# Patient Record
Sex: Male | Born: 1965
Health system: Southern US, Community
[De-identification: ages and names within clinical notes are randomized; demographics above are authoritative.]

## PROBLEM LIST (undated history)

## (undated) DIAGNOSIS — F32A Depression, unspecified: Secondary | ICD-10-CM

## (undated) DIAGNOSIS — W19XXXA Unspecified fall, initial encounter: Secondary | ICD-10-CM

## (undated) DIAGNOSIS — F419 Anxiety disorder, unspecified: Secondary | ICD-10-CM

## (undated) DIAGNOSIS — L409 Psoriasis, unspecified: Secondary | ICD-10-CM

## (undated) DIAGNOSIS — M1990 Unspecified osteoarthritis, unspecified site: Secondary | ICD-10-CM

## (undated) DIAGNOSIS — F1010 Alcohol abuse, uncomplicated: Secondary | ICD-10-CM

## (undated) DIAGNOSIS — G8929 Other chronic pain: Secondary | ICD-10-CM

## (undated) DIAGNOSIS — S42401A Unspecified fracture of lower end of right humerus, initial encounter for closed fracture: Secondary | ICD-10-CM

## (undated) DIAGNOSIS — M549 Dorsalgia, unspecified: Secondary | ICD-10-CM

## (undated) DIAGNOSIS — F329 Major depressive disorder, single episode, unspecified: Secondary | ICD-10-CM

## (undated) HISTORY — PX: SHOULDER SURGERY: SHX246

## (undated) HISTORY — DX: Psoriasis, unspecified: L40.9

## (undated) HISTORY — PX: TOTAL HIP ARTHROPLASTY: SHX124

## (undated) HISTORY — DX: Unspecified osteoarthritis, unspecified site: M19.90

## (undated) HISTORY — PX: APPENDECTOMY: SHX54

## (undated) HISTORY — DX: Anxiety disorder, unspecified: F41.9

## (undated) HISTORY — PX: FOOT SURGERY: SHX648

---
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
## 1998-01-04 ENCOUNTER — Emergency Department (HOSPITAL_COMMUNITY): Admission: EM | Admit: 1998-01-04 | Discharge: 1998-01-04 | Payer: Self-pay | Admitting: Emergency Medicine

## 1999-08-02 ENCOUNTER — Emergency Department (HOSPITAL_COMMUNITY): Admission: EM | Admit: 1999-08-02 | Discharge: 1999-08-02 | Payer: Self-pay

## 1999-09-14 ENCOUNTER — Ambulatory Visit (HOSPITAL_COMMUNITY): Admission: RE | Admit: 1999-09-14 | Discharge: 1999-09-14 | Payer: Self-pay | Admitting: Orthopedic Surgery

## 1999-09-14 ENCOUNTER — Encounter: Payer: Self-pay | Admitting: Orthopedic Surgery

## 2005-02-15 ENCOUNTER — Ambulatory Visit (HOSPITAL_COMMUNITY): Admission: RE | Admit: 2005-02-15 | Discharge: 2005-02-15 | Payer: Self-pay | Admitting: Family Medicine

## 2006-07-12 ENCOUNTER — Ambulatory Visit (HOSPITAL_COMMUNITY): Admission: RE | Admit: 2006-07-12 | Discharge: 2006-07-12 | Payer: Self-pay | Admitting: General Surgery

## 2007-05-22 ENCOUNTER — Ambulatory Visit (HOSPITAL_COMMUNITY): Admission: RE | Admit: 2007-05-22 | Discharge: 2007-05-22 | Payer: Self-pay | Admitting: Family Medicine

## 2007-10-12 ENCOUNTER — Inpatient Hospital Stay (HOSPITAL_COMMUNITY): Admission: RE | Admit: 2007-10-12 | Discharge: 2007-10-14 | Payer: Self-pay | Admitting: Orthopedic Surgery

## 2009-08-21 ENCOUNTER — Encounter: Admission: RE | Admit: 2009-08-21 | Discharge: 2009-08-21 | Payer: Self-pay | Admitting: Neurosurgery

## 2010-04-16 ENCOUNTER — Encounter: Admission: RE | Admit: 2010-04-16 | Discharge: 2010-04-16 | Payer: Self-pay | Admitting: Neurosurgery

## 2010-09-29 NOTE — H&P (Signed)
NAME:  Howard [name], Howard [name] NO.:  [id number]   MEDICAL RECORD NO.:  [id number]          PATIENT TYPE:  INP   LOCATION:  NA                           FACILITY:  [facility]   PHYSICIAN:  Madlyn Frankel. Charlann Boxer, M.D.  DATE OF BIRTH:  Sep 08, 1965   DATE OF ADMISSION:  10/12/2007  DATE OF DISCHARGE:                              HISTORY & PHYSICAL   PROCEDURE:  Right total hip arthroplasty.   CHIEF COMPLAINT:  Right hip pain.   HISTORY OF PRESENT ILLNESS:  A 45 year old male with a history of right  hip pain secondary to avascular necrosis.  It has been refractory to all  conservative treatment.  It has been persistent and progressive in  nature, significantly diminished quality of life.  Pain has been  intractable and he has been scheduled for a right total hip replacement.   PAST MEDICAL HISTORY:  1. Avascular necrosis.  2. Degenerative disk disease.   PAST SURGICAL HISTORY:  Appendectomy.   FAMILY HISTORY:  Diabetes, arthritis.   SOCIAL HISTORY:  Married.  Primary caregiver after procedure will be  spouse.   DRUG ALLERGIES:  NO KNOWN DRUG ALLERGIES.   MEDICATIONS:  Percocet for pain control due to avascular necrosis 325 mg  1 p.o. q.6 h.   REVIEW OF SYSTEMS:  None other than HPI.   PHYSICAL EXAMINATION:  VITAL SIGNS:  Pulse 72, respirations 18, blood  pressure 126/85.  GENERAL:  Awake, alert and oriented, well-developed, well-nourished in  no acute distress.  He does walk with a single-point cane due to pain of  right hip.  NECK:  Supple.  No carotid bruits.  CHEST/LUNGS:  Clear to auscultation bilaterally.  BREASTS:  Deferred.  HEART:  Regular rate and rhythm.  S1-S2 distinct.  ABDOMEN:  Soft, nontender, nondistended.  Bowel sounds are present.  GENITOURINARY:  Deferred.  EXTREMITIES:  He has significant increased pain with internal range of  motion of his right hip.  Decreased range of motion as well.  SKIN:  No cellulitis.  NEUROLOGIC:  Intact distal  sensibilities.   LABORATORY DATA:  Labs, EKG, chest x-ray are all pending presurgical  testing.   IMPRESSION:  Right hip avascular necrosis.   PLAN OF ACTION:  Right total hip arthroplasty at [facility] on  Oct 12, 2007 by surgeon Dr. Durene Romans.  Risks and complications were  discussed.   Postoperative medications were provided at time of history and physical,  including Lovenox, Robaxin, iron, aspirin, Colace, MiraLax.  Pain  medicines will be provided at time of surgery.     ______________________________  Yetta Glassman [address], [place]      Madlyn Frankel. Charlann Boxer, M.D.  Electronically Signed    BLM/MEDQ  D:  09/30/2007  T:  09/30/2007  Job:  [id number]

## 2010-09-29 NOTE — Op Note (Signed)
NAME:  Howard [name], Howard [name] NO.:  [id number]   MEDICAL RECORD NO.:  [id number]          PATIENT TYPE:  INP   LOCATION:  0005                         FACILITY:  [facility]   PHYSICIAN:  Madlyn Frankel. Charlann Boxer, M.D.  DATE OF BIRTH:  Aug 25, 1965   DATE OF PROCEDURE:  10/12/2007  DATE OF DISCHARGE:                               OPERATIVE REPORT   PREOPERATIVE DIAGNOSIS:  Right hip degenerative joint disease secondary  to an avascular necrosis diagnosis.   POSTOPERATIVE DIAGNOSIS:  Right hip degenerative joint disease secondary  to an avascular necrosis diagnosis.   PROCEDURE:  Right total hip replacement.   COMPONENTS USED:  DePuy hip system.  Size 9 high trial Trilock stem with  a 47 +5 ASR ball and adapter, a 54 ASR cup.   SURGEON:  Madlyn Frankel. Charlann Boxer, M.D.   ASSISTANT:  Yetta Glassman. Mann, PA.   ANESTHESIA:  General.   BLOOD LOSS:  400.   DRAINS:  x1.   COMPLICATIONS:  None.   INDICATIONS FOR PROCEDURE:  Howard [name] is a 45 year old male who presented to  the office for evaluation of his right hip pain.  He had had groin  discomfort that had persisted despite conservative attempts.  Given the  persistence of his discomfort he wished to proceed with hip replacement  surgery.  Risks and benefits of hip replacement surgery were discussed,  and we particularly focused on the fact that he was 45 years of age.   Despite discussing these risks and the potential need for revision,  infection, dislocation, component failure and DVT, he wished to proceed  with hip replacement surgery.  Consent was obtained.   PROCEDURE IN DETAIL:  The patient was brought to the operative theater.  Once adequate anesthesia, preoperative antibiotics, Ancef, administered,  the patient was positioned in the left lateral decubitus position with  the right side up and bony prominences padded.  The right lower  extremity was then prescrubbed and prepped and draped in a sterile  fashion.  A lateral-based incision  was made for a posterior approach to  the hip.  The iliotibial band and gluteus fascia were incised  posteriorly.   There were obvious findings for an inflammatory-type process consistent  with his avascular necrosis with a significantly scarred-down posterior  capsule and short external rotators.  There was noted be a large  hemarthrosis with abundant hyperemic synovitis.  The capsule was taken  down separately from the posterior capsule.  I preserved this for later  repair at the end of the case.  We also protected the sciatic nerve from  retractors.   The hip was dislocated and a neck osteotomy made based off anatomic  landmarks and preoperative templating.  Following the neck osteotomy, I  began preparation of the femur.  I used a box osteotome to chisel out  some lateral neck and then to assure laterality of the component.  I  used a starting drill followed by a hand reamer and then irrigated the  canal to prevent fat emboli.  I broached with a size 1, then 3, 5, 7 and  8.  With the 8 broach I then used a calcar planer to finish off some of  the medial neck cut.  Following this I checked.  There was a little bit  of torsional movement so I went up to the size 9, which was firmly fit  for the level of the neck cut.  At this point I went ahead and packed  the femur off with a sponge and attended now to the acetabulum.  Acetabular exposure was obtained removing some of the synovitis but also  the labrum.  I began reaming with a 43 reamer and sequentially reamed up  to a 53 reamer with good bony bed preparation.  I marked the anterior  wall where the reamer was set to assure my anatomic landmarks.  I then  impacted a 54 ASR cup to the level anteriorly, checking to assess the  anteversion and abduction were appropriate.  I used the guide on the  impactor as well as checking with my hip guide.  I removed the posterior  wall osteophyte.  The patient's cup position appeared to be at 40   degrees of abduction, 20 degrees of forward flexion.  Trial reduction  was now carried out.  With a size 9 high neck on and a +2 adapter, the  hip was very stable but there were a coupe of millimeters of shuck.  Given these parameters I went ahead and removed the trial components and  impacted the size 9 high Trilock stem.  This sat at or a little bit  lower than where I had placed the broach.  I Retrialled and then  trialled with a +5 adapter.  The +5 adapter gave me still a millimeter  of shuck.  The hip stability was very good and the leg lengths appeared  to be comparable to the down leg compared to the preoperative position  and assessment.   Given this, I opened up the +5 adapter with the 47 ASR ball.  They were  impacted on the back table and then impacted to a clean and dry trunion.  The hip was reduced.  The hip was irrigated throughout the case and  again at this point.  I reapproximated the posterior leaflet to the  superior leaflet.  I did remove some of the posterior synovial fat pad  and hypertrophic tissue and cauterized necessary bleeding.  A medium  Hemovac drain was placed deep.  The remainder of the wound at this point  was closed with #1 Vicryl in the gluteal fascia and iliotibial band, 2-0  Vicryl was used in the subcu layer, followed by 4-0 running Monocryl.  The hip was cleaned, dried and dressed sterilely with Steri-Strips and  dressing sponge.  He was brought to the recovery room in stable  condition.      Madlyn Frankel Charlann Boxer, M.D.  Electronically Signed     MDO/MEDQ  D:  10/12/2007  T:  10/12/2007  Job:  [id number]

## 2010-10-02 NOTE — H&P (Signed)
NAME:  Howard [name], KAKAR NO.:  [id number]   MEDICAL RECORD NO.:  [id number]          PATIENT TYPE:  AMB   LOCATION:                                FACILITY:  [facility]   PHYSICIAN:  Dalia Heading, M.D.  DATE OF BIRTH:  [birth year]   DATE OF ADMISSION:  DATE OF DISCHARGE:  LH                              HISTORY & PHYSICAL   CHIEF COMPLAINT:  History of diverticulosis, colon polyps.   HISTORY OF PRESENT ILLNESS:  The patient is a 45 year old white male  status post a colonoscopy with polypectomy for a sigmoid colon polyp in  2002 who now presents for a follow-up colonoscopy.  He denies any  lightheadedness, weight loss, fever, constipation, diarrhea, melena,  hematochezia.  There is no family history of colon carcinoma.   PAST MEDICAL HISTORY:  Unremarkable.   PAST SURGICAL HISTORY:  1. As noted above.  2. Appendectomy.   CURRENT MEDICATIONS:  Xanax p.r.n.   ALLERGIES:  NO KNOWN DRUG ALLERGIES.   REVIEW OF SYSTEMS:  Noncontributory.   PHYSICAL EXAMINATION:  GENERAL:  The patient is a well-developed, well-  nourished white male in no acute distress.  LUNGS:  Clear to auscultation with equal breath sounds bilaterally.  HEART:  Regular rate and rhythm without S3, S4 or murmurs.  ABDOMEN:  Soft, nontender, nondistended.  No hepatosplenomegaly or  masses are noted.  RECTAL:  Examination was deferred to the procedure.   IMPRESSION:  History of diverticulosis, colon polyp.   PLAN:  The patient is scheduled for colonoscopy on July 12, 2006.  The risks and benefits of the procedure including bleeding and  perforation were fully explained to the patient, who gave informed  consent.      Dalia Heading, M.D.  Electronically Signed     MAJ/MEDQ  D:  06/30/2006  T:  06/30/2006  Job:  [id number]   cc:   Kirk Ruths, M.D.  Fax: [phone]

## 2011-02-10 LAB — BASIC METABOLIC PANEL
CO2: 30
Calcium: 7.9 — ABNORMAL LOW
Calcium: 8.2 — ABNORMAL LOW
Calcium: 9.2
Chloride: 107
Creatinine, Ser: 0.58
Creatinine, Ser: 0.68
Creatinine, Ser: 0.72
GFR calc Af Amer: >60
GFR calc Af Amer: >60
GFR calc non Af Amer: >60
Glucose, Bld: 123 — ABNORMAL HIGH
Glucose, Bld: 128 — ABNORMAL HIGH
Sodium: 135

## 2011-02-10 LAB — APTT: aPTT: 22 — ABNORMAL LOW

## 2011-02-10 LAB — CBC
Hemoglobin: 11.3 — ABNORMAL LOW
Hemoglobin: 11.6 — ABNORMAL LOW
MCHC: 34.7
MCV: 99.1
RBC: 4.46
RDW: 12.8
RDW: 12.9
WBC: 6.5

## 2011-02-10 LAB — PROTIME-INR: INR: 0.9

## 2011-02-10 LAB — URINALYSIS, ROUTINE W REFLEX MICROSCOPIC
Nitrite: NEGATIVE
Specific Gravity, Urine: 1.015
Urobilinogen, UA: 0.2

## 2011-02-10 LAB — DIFFERENTIAL
Lymphs Abs: 2.3
Monocytes Relative: 10
Neutro Abs: 3.5
Neutrophils Relative %: 53

## 2011-02-10 LAB — TYPE AND SCREEN
ABO/RH(D): A NEG
Antibody Screen: NEGATIVE

## 2013-03-20 ENCOUNTER — Ambulatory Visit (INDEPENDENT_AMBULATORY_CARE_PROVIDER_SITE_OTHER): Payer: Managed Care, Other (non HMO)

## 2013-03-20 ENCOUNTER — Ambulatory Visit: Payer: Self-pay

## 2013-03-20 ENCOUNTER — Encounter: Payer: Self-pay | Admitting: Podiatry

## 2013-03-20 ENCOUNTER — Ambulatory Visit (INDEPENDENT_AMBULATORY_CARE_PROVIDER_SITE_OTHER): Payer: Managed Care, Other (non HMO) | Admitting: Podiatry

## 2013-03-20 VITALS — Ht 72.0 in | Wt 230.0 lb

## 2013-03-20 DIAGNOSIS — M722 Plantar fascial fibromatosis: Secondary | ICD-10-CM

## 2013-03-20 DIAGNOSIS — M2040 Other hammer toe(s) (acquired), unspecified foot: Secondary | ICD-10-CM

## 2013-03-20 MED ORDER — HYDROCODONE-ACETAMINOPHEN 10-325 MG PO TABS: ORAL_TABLET | ORAL | Status: DC

## 2013-03-20 MED ORDER — METHYLPREDNISOLONE (PAK) 4 MG PO TABS: ORAL_TABLET | ORAL | Status: DC

## 2013-03-20 NOTE — Progress Notes (Signed)
Howard [name] presents today with a chief complaint of painful toes 2 and 3 of his left foot as well as painful nodules to the plantar medial aspect of the bilateral foot left greater than right. He states that these been present for quite some time. Has been seen by an orthopedist who injected him with steroids. He states that it was severely painful and would have it done again. He states that anytime he is walking or wearing shoes is his feet are aggravated. Gradually getting worse over the past 2-3 years and is now becoming concerned about it.  Objective: Vital signs are stable he is alert and oriented x3. I have reviewed his past medical history medications and allergies. Pulses remain palpable bilateral lower extremity. Neurologic sensorium is intact per Semmes-Weinstein monofilament. Deep tendon reflexes are brisk and equal bilateral. Orthopedic evaluation does demonstrate digital abductus with hammertoe deformities 2 and 3 of the bilateral foot left being worse than the right. He also has large nodular mass is nonpulsatile in nature along the medial aspect of the bilateral foot appears to be more firm than ganglion cysts. I do believe this is plantar fibromatosis. Radiographs confirm osseous abnormalities of the toes after mentioned bilateral.  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 Hammertoe deformities 23 and 4 of the left foot. Osteoarthritic changes are noted.  Plan: We discussed the etiology pathology conservative versus surgical therapies. Due to the severity of his fibromatosis I feel that an MRI of the bilateral foot would be necessary. We will discuss with the conclusion of the MRI, surgical intervention versus injection or therapy. I will followup with him with his MRI comes back. I wrote a prescription for a Sterapred Dosepak as well as for Vicodin.

## 2013-03-20 NOTE — Progress Notes (Signed)
N - numbness, aches L - 2nd and 3rd toes bilateral and soft knots plantar bilateral (under 1st met) D - 2 yrs  O - gradual C - hammertoes, redness, knot getting larger A - walking, shoes T - Orthopedist injected knots, tramadol, aleve, rest

## 2013-03-22 ENCOUNTER — Other Ambulatory Visit: Payer: Self-pay | Admitting: Podiatry

## 2013-03-29 ENCOUNTER — Ambulatory Visit
Admission: RE | Admit: 2013-03-29 | Discharge: 2013-03-29 | Disposition: A | Discharge status: Home / Self Care | Payer: Managed Care, Other (non HMO) | Source: Ambulatory Visit | Attending: Podiatry | Admitting: Podiatry

## 2013-03-29 DIAGNOSIS — M722 Plantar fascial fibromatosis: Secondary | ICD-10-CM

## 2013-03-29 MED ORDER — GADOBENATE DIMEGLUMINE 529 MG/ML IV SOLN
10.0000 mL | Freq: Once | INTRAVENOUS | Status: AC | PRN
  Administered 2013-03-29: 10 mL via INTRAVENOUS

## 2013-04-04 ENCOUNTER — Telehealth: Payer: Self-pay | Admitting: *Deleted

## 2013-04-04 NOTE — Telephone Encounter (Signed)
Contacted pt informed of Dr Geryl Rankins request for a consultation to discuss MRI results and surgery.  Transferred pt to scheduler.

## 2013-04-04 NOTE — Telephone Encounter (Signed)
Message copied by Marissa Nestle on Wed Apr 04, 2013  3:49 PM ------      Message from: Ernestene Kiel T      Created: Tue Apr 03, 2013  4:56 PM       Please have Geoffery in for surgical consideration.  Diagnosis plantar fibromatosis ------

## 2013-04-10 ENCOUNTER — Ambulatory Visit (INDEPENDENT_AMBULATORY_CARE_PROVIDER_SITE_OTHER): Payer: Managed Care, Other (non HMO) | Admitting: Podiatry

## 2013-04-10 ENCOUNTER — Encounter: Payer: Self-pay | Admitting: Podiatry

## 2013-04-10 VITALS — BP 125/81 | HR 74 | Resp 16

## 2013-04-10 DIAGNOSIS — M722 Plantar fascial fibromatosis: Secondary | ICD-10-CM

## 2013-04-10 MED ORDER — HYDROCODONE-ACETAMINOPHEN 10-325 MG PO TABS: ORAL_TABLET | ORAL | Status: DC

## 2013-04-10 NOTE — Progress Notes (Signed)
Howard [name] presents today for followup of his MRI report which did come back positive for very large plantar fibromas to the forefoot bilateral. The fibromas did not probe deep to the muscle or bone. However they do encompass nerves and extend to the subcutaneous level of the skin.  Objective: Pulses are strongly palpable bilateral large non-pulsatile masses diagnosed per MRI is fibromas. Bilateral fibromas are painful on palpation and appear to be unchanged.  Assessment: Plantar fibromas a lateral foot.  Plan: Injected 2 injections to the right foot today consisting of Kenalog and local anesthetic a total of approximately 10 mg was utilized. The contralateral foot 3 injections with a total of about 30 mg utilized. Followup with him in 6 weeks

## 2013-05-22 ENCOUNTER — Ambulatory Visit (INDEPENDENT_AMBULATORY_CARE_PROVIDER_SITE_OTHER): Payer: Managed Care, Other (non HMO) | Admitting: Podiatry

## 2013-05-22 ENCOUNTER — Encounter: Payer: Self-pay | Admitting: Podiatry

## 2013-05-22 VITALS — BP 133/86 | HR 82 | Resp 16

## 2013-05-22 DIAGNOSIS — M722 Plantar fascial fibromatosis: Secondary | ICD-10-CM

## 2013-05-22 MED ORDER — HYDROCODONE-ACETAMINOPHEN 10-325 MG PO TABS: ORAL_TABLET | ORAL | Status: DC

## 2013-05-22 NOTE — Progress Notes (Signed)
Howard [name] presents today for followup of his plantar fibromatosis bilateral foot. He states the right foot has not shrunk at all. But the nodules are nontender. However his left foot still demonstrates painful plantar fibromas to the plantar medial and medial aspect of the left foot.  Objective: Vital signs are stable he is alert and oriented x3. Pulses are palpable bilateral. Plantar fibromas have reduced in thickness by my physical exam considerably of the left foot minimally so on the right foot.  Assessment: Plantar fibromatosis bilateral. Painful left.  Plan: Reinjected Kenalog a total of 30 mg to the plantar aspect of his left foot. 3 different injections at 3 different injection sites. I will followup with him in 6 week

## 2013-07-03 ENCOUNTER — Ambulatory Visit: Payer: Managed Care, Other (non HMO) | Admitting: Podiatry

## 2014-04-25 ENCOUNTER — Ambulatory Visit: Payer: Managed Care, Other (non HMO) | Admitting: Podiatry

## 2014-04-30 ENCOUNTER — Encounter: Payer: Self-pay | Admitting: Podiatry

## 2014-04-30 ENCOUNTER — Ambulatory Visit (INDEPENDENT_AMBULATORY_CARE_PROVIDER_SITE_OTHER): Payer: Managed Care, Other (non HMO) | Admitting: Podiatry

## 2014-04-30 VITALS — BP 120/84 | HR 103 | Resp 16

## 2014-04-30 DIAGNOSIS — M722 Plantar fascial fibromatosis: Secondary | ICD-10-CM

## 2014-04-30 MED ORDER — HYDROCODONE-ACETAMINOPHEN 10-325 MG PO TABS: 1.0000 | ORAL_TABLET | Freq: Four times a day (QID) | ORAL | Status: DC | PRN

## 2014-04-30 NOTE — Progress Notes (Signed)
He presents presents today for follow-up of his plantar fibromas and plantar aspect of the bilateral foot. He states they still bother me regularly.  Objective: Vital signs are stable he's alert and oriented 3. 3 large plantar fibromas to the plantar medial band of the plantar fascia of the left foot are prominent he also has one prominent area to the plantar medial aspect is proximal to the first metatarsophalangeal joint right foot.  Assessment: Plantar fibromatosis bilateral.  Plan: Injected these today with Kenalog and local anesthetic discussed the possible need for surgical intervention follow with him in 6 weeks at which time we may reinject.

## 2014-07-27 ENCOUNTER — Encounter (HOSPITAL_COMMUNITY): Payer: Self-pay | Admitting: Emergency Medicine

## 2014-07-27 ENCOUNTER — Emergency Department (HOSPITAL_COMMUNITY)
Admission: EM | Admit: 2014-07-27 | Discharge: 2014-07-27 | Disposition: A | Discharge status: Home / Self Care | Payer: Managed Care, Other (non HMO) | Source: Home / Self Care | Attending: Family Medicine | Admitting: Family Medicine

## 2014-07-27 DIAGNOSIS — J40 Bronchitis, not specified as acute or chronic: Secondary | ICD-10-CM

## 2014-07-27 MED ORDER — AZITHROMYCIN 250 MG PO TABS: 250.0000 mg | ORAL_TABLET | Freq: Every day | ORAL | Status: DC

## 2014-07-27 MED ORDER — IPRATROPIUM-ALBUTEROL 0.5-2.5 (3) MG/3ML IN SOLN
3.0000 mL | Freq: Once | RESPIRATORY_TRACT | Status: AC
  Administered 2014-07-27: 3 mL via RESPIRATORY_TRACT

## 2014-07-27 MED ORDER — IPRATROPIUM-ALBUTEROL 0.5-2.5 (3) MG/3ML IN SOLN
RESPIRATORY_TRACT | Status: AC
  Filled 2014-07-27: qty 3

## 2014-07-27 MED ORDER — PREDNISONE 50 MG PO TABS: 50.0000 mg | ORAL_TABLET | Freq: Every day | ORAL | Status: DC

## 2014-07-27 NOTE — Discharge Instructions (Signed)
Thank you for coming in today. °Call or go to the emergency room if you get worse, have trouble breathing, have chest pains, or palpitations.  °Please quit smoking. ° °Acute Bronchitis °Bronchitis is inflammation of the airways that extend from the windpipe into the lungs (bronchi). The inflammation often causes mucus to develop. This leads to a cough, which is the most common symptom of bronchitis.  °In acute bronchitis, the condition usually develops suddenly and goes away over time, usually in a couple weeks. Smoking, allergies, and asthma can make bronchitis worse. Repeated episodes of bronchitis may cause further lung problems.  °CAUSES °Acute bronchitis is most often caused by the same virus that causes a cold. The virus can spread from person to person (contagious) through coughing, sneezing, and touching contaminated objects. °SIGNS AND SYMPTOMS  °· Cough.   °· Fever.   °· Coughing up mucus.   °· Body aches.   °· Chest congestion.   °· Chills.   °· Shortness of breath.   °· Sore throat.   °DIAGNOSIS  °Acute bronchitis is usually diagnosed through a physical exam. Your health care provider will also ask you questions about your medical history. Tests, such as chest X-rays, are sometimes done to rule out other conditions.  °TREATMENT  °Acute bronchitis usually goes away in a couple weeks. Oftentimes, no medical treatment is necessary. Medicines are sometimes given for relief of fever or cough. Antibiotic medicines are usually not needed but may be prescribed in certain situations. In some cases, an inhaler may be recommended to help reduce shortness of breath and control the cough. A cool mist vaporizer may also be used to help thin bronchial secretions and make it easier to clear the chest.  °HOME CARE INSTRUCTIONS °· Get plenty of rest.   °· Drink enough fluids to keep your urine clear or pale yellow (unless you have a medical condition that requires fluid restriction). Increasing fluids may help thin your  respiratory secretions (sputum) and reduce chest congestion, and it will prevent dehydration.   °· Take medicines only as directed by your health care provider. °· If you were prescribed an antibiotic medicine, finish it all even if you start to feel better. °· Avoid smoking and secondhand smoke. Exposure to cigarette smoke or irritating chemicals will make bronchitis worse. If you are a smoker, consider using nicotine gum or skin patches to help control withdrawal symptoms. Quitting smoking will help your lungs heal faster.   °· Reduce the chances of another bout of acute bronchitis by washing your hands frequently, avoiding people with cold symptoms, and trying not to touch your hands to your mouth, nose, or eyes.   °· Keep all follow-up visits as directed by your health care provider.   °SEEK MEDICAL CARE IF: °Your symptoms do not improve after 1 week of treatment.  °SEEK IMMEDIATE MEDICAL CARE IF: °· You develop an increased fever or chills.   °· You have chest pain.   °· You have severe shortness of breath. °· You have bloody sputum.   °· You develop dehydration. °· You faint or repeatedly feel like you are going to pass out. °· You develop repeated vomiting. °· You develop a severe headache. °MAKE SURE YOU:  °· Understand these instructions. °· Will watch your condition. °· Will get help right away if you are not doing well or get worse. °Document Released: 06/10/2004 Document Revised: 09/17/2013 Document Reviewed: 10/24/2012 °[employer]® Patient Information ©2015 [employer]. This information is not intended to replace advice given to you by your health care provider. Make sure you discuss any questions you   have with your health care provider. ° °

## 2014-07-27 NOTE — ED Provider Notes (Signed)
Aram CandelaJohn R Langham is a 49 y.o. male who presents to Urgent Care today for cough congestion headache chills. Symptoms present for 3 days. Patient notes some wheezing. He denies any significant body aches fevers vomiting or diarrhea. He has tried some DayQuil which helps. He takes Norco for existing left hip DJD that is scheduled for replacement.   Past Medical History  Diagnosis Date  . Psoriasis   . Osteoarthritis   . Anxiety    Past Surgical History  Procedure Laterality Date  . Total hip arthroplasty    . Appendectomy     History  Substance Use Topics  . Smoking status: Current Every Day Smoker  . Smokeless tobacco: Not on file  . Alcohol Use: Yes   ROS as above Medications: Current Facility-Administered Medications  Medication Dose Route Frequency Provider Last Rate Last Dose  . ipratropium-albuterol (DUONEB) 0.5-2.5 (3) MG/3ML nebulizer solution 3 mL  3 mL Nebulization Once Rodolph BongEvan S Corey, MD       Current Outpatient Prescriptions  Medication Sig Dispense Refill  . ALPRAZolam (XANAX) 0.5 MG tablet Take 0.5 mg by mouth at bedtime as needed for anxiety.    Marland Kitchen. HYDROcodone-acetaminophen (NORCO) 10-325 MG per tablet Take 1 tablet by mouth every 6 (six) hours as needed. 60 tablet 0  . OxyCODONE HCl (OXYCONTIN PO) Take by mouth.    . nabumetone (RELAFEN) 750 MG tablet Take 750 mg by mouth daily.    . traMADol (ULTRAM) 50 MG tablet Take by mouth every 6 (six) hours as needed.     No Known Allergies   Exam:  BP 108/72 mmHg  Pulse 85  Temp(Src) 99.7 F (37.6 C) (Oral)  Resp 18  SpO2 97% Gen: Well NAD HEENT: EOMI,  MMM normal appearing conjunctiva bilaterally. Posterior pharynx with cobblestoning. Normal tympanic membranes bilaterally. Clear nasal discharge. Lungs: Normal work of breathing. Coarse right-sided breath sounds Heart: RRR no MRG Abd: NABS, Soft. Nondistended, Nontender Exts: Brisk capillary refill, warm and well perfused.   Patient was given a 2.5/0.5 mg DuoNeb  nebulizer treatment, and felt a little better  No results found for this or any previous visit (from the past 24 hour(s)). No results found.  Assessment and Plan: [age] y.o. male with bronchitis treat with prednisone and azithromycin. Use existing opiate prescription for cough suppression as needed. NSAIDs for pain as needed. Return as needed. Encourage smoking cessation.  Discussed warning signs or symptoms. Please see discharge instructions. Patient expresses understanding.     Rodolph BongEvan S Corey, MD 07/27/14 1019

## 2014-07-27 NOTE — ED Notes (Signed)
C/o cold sx onset Thursday Sx include productive cough, congestion, HA, fever Denies SOB, wheezing Has been taking OTC cold meds w/no relief Alert, no signs of acute distress.

## 2014-08-16 NOTE — Patient Instructions (Addendum)
Aram CandelaJohn R Forstner  08/16/2014   Your procedure is scheduled on:  08/27/2014    [facility] and follow signs to               Short Stay Center at    0830 AM.  Call this number if you have problems the morning of surgery [phone]   Remember:  Do not eat food or drink liquids :After Midnight.     Take these medicines the morning of surgery with A SIP OF WATER: Xanax if needed                                You may not have any metal on your body including hair pins and              piercings  Do not wear jewelry,  lotions, powders or perfumes., deodorant.                           Men may shave face and neck.   Do not bring valuables to the hospital. [facility] IS NOT             RESPONSIBLE   FOR VALUABLES.  Contacts, dentures or bridgework may not be worn into surgery.  Leave suitcase in the car. After surgery it may be brought to your room.         Special Instructions:coughing and deep breathing exercises, leg exercises               Please read over the following fact sheets you were given: _____________________________________________________________________             [facility] Pre[facility] Before surgery, you can play an important role.  Because skin is not sterile, your skin needs to be as free of germs as possible.  You can reduce the number of germs on your skin by washing with CHG (chlorahexidine gluconate) soap before surgery.  CHG is an antiseptic cleaner which kills germs and bonds with the skin to continue killing germs even after washing. Please DO NOT use if you have an allergy to CHG or antibacterial soaps.  If your skin becomes reddened/irritated stop using the CHG and inform your nurse when you arrive at Short Stay. Do not shave (including legs and underarms) for at least 48 hours prior to the first CHG shower.  You may shave your face/neck. Please follow these instructions carefully:  1.  Shower  with CHG Soap the night before surgery and the  morning of Surgery.  2.  If you choose to wash your hair, wash your hair first as usual with your  normal  shampoo.  3.  After you shampoo, rinse your hair and body thoroughly to remove the  shampoo.                           4.  Use CHG as you would any other liquid soap.  You can apply chg directly  to the skin and wash                       Gently with a scrungie or clean washcloth.  5.  Apply the CHG Soap to your body ONLY  FROM THE NECK DOWN.   Do not use on face/ open                           Wound or open sores. Avoid contact with eyes, ears mouth and genitals (private parts).                       Wash face,  Genitals (private parts) with your normal soap.             6.  Wash thoroughly, paying special attention to the area where your surgery  will be performed.  7.  Thoroughly rinse your body with warm water from the neck down.  8.  DO NOT shower/wash with your normal soap after using and rinsing off  the CHG Soap.                9.  Pat yourself dry with a clean towel.            10.  Wear clean pajamas.            11.  Place clean sheets on your bed the night of your first shower and do not  sleep with pets. Day of Surgery : Do not apply any lotions/deodorants the morning of surgery.  Please wear clean clothes to the hospital/surgery center.  FAILURE TO FOLLOW THESE INSTRUCTIONS MAY RESULT IN THE CANCELLATION OF YOUR SURGERY PATIENT SIGNATURE_________________________________  NURSE SIGNATURE__________________________________  ________________________________________________________________________  WHAT IS A BLOOD TRANSFUSION? Blood Transfusion Information  A transfusion is the replacement of blood or some of its parts. Blood is made up of multiple cells which provide different functions.  Red blood cells carry oxygen and are used for blood loss replacement.  White blood cells fight against infection.  Platelets control  bleeding.  Plasma helps clot blood.  Other blood products are available for specialized needs, such as hemophilia or other clotting disorders. BEFORE THE TRANSFUSION  Who gives blood for transfusions?   Healthy volunteers who are fully evaluated to make sure their blood is safe. This is blood bank blood. Transfusion therapy is the safest it has ever been in the practice of medicine. Before blood is taken from a donor, a complete history is taken to make sure that person has no history of diseases nor engages in risky social behavior (examples are intravenous drug use or sexual activity with multiple partners). The donor's travel history is screened to minimize risk of transmitting infections, such as malaria. The donated blood is tested for signs of infectious diseases, such as HIV and hepatitis. The blood is then tested to be sure it is compatible with you in order to minimize the chance of a transfusion reaction. If you or a relative donates blood, this is often done in anticipation of surgery and is not appropriate for emergency situations. It takes many days to process the donated blood. RISKS AND COMPLICATIONS Although transfusion therapy is very safe and saves many lives, the main dangers of transfusion include:  1. Getting an infectious disease. 2. Developing a transfusion reaction. This is an allergic reaction to something in the blood you were given. Every precaution is taken to prevent this. The decision to have a blood transfusion has been considered carefully by your caregiver before blood is given. Blood is not given unless the benefits outweigh the risks. AFTER THE TRANSFUSION  Right after receiving a blood transfusion, you will usually feel much better  and more energetic. This is especially true if your red blood cells have gotten low (anemic). The transfusion raises the level of the red blood cells which carry oxygen, and this usually causes an energy increase.  The nurse  administering the transfusion will monitor you carefully for complications. HOME CARE INSTRUCTIONS  No special instructions are needed after a transfusion. You may find your energy is better. Speak with your caregiver about any limitations on activity for underlying diseases you may have. SEEK MEDICAL CARE IF:   Your condition is not improving after your transfusion.  You develop redness or irritation at the intravenous (IV) site. SEEK IMMEDIATE MEDICAL CARE IF:  Any of the following symptoms occur over the next 12 hours:  Shaking chills.  You have a temperature by mouth above 102 F (38.9 C), not controlled by medicine.  Chest, back, or muscle pain.  People around you feel you are not acting correctly or are confused.  Shortness of breath or difficulty breathing.  Dizziness and fainting.  You get a rash or develop hives.  You have a decrease in urine output.  Your urine turns a dark color or changes to pink, red, or brown. Any of the following symptoms occur over the next 10 days:  You have a temperature by mouth above 102 F (38.9 C), not controlled by medicine.  Shortness of breath.  Weakness after normal activity.  The white part of the eye turns yellow (jaundice).  You have a decrease in the amount of urine or are urinating less often.  Your urine turns a dark color or changes to pink, red, or brown. Document Released: 04/30/2000 Document Revised: 07/26/2011 Document Reviewed: 12/18/2007 ExitCare Patient Information 2014 [place].  _______________________________________________________________________  Incentive Spirometer  An incentive spirometer is a tool that can help keep your lungs clear and active. This tool measures how well you are filling your lungs with each breath. Taking long deep breaths may help reverse or decrease the chance of developing breathing (pulmonary) problems (especially infection) following:  A long period of time when you are  unable to move or be active. BEFORE THE PROCEDURE   If the spirometer includes an indicator to show your best effort, your nurse or respiratory therapist will set it to a desired goal.  If possible, sit up straight or lean slightly forward. Try not to slouch.  Hold the incentive spirometer in an upright position. INSTRUCTIONS FOR USE  3. Sit on the edge of your bed if possible, or sit up as far as you can in bed or on a chair. 4. Hold the incentive spirometer in an upright position. 5. Breathe out normally. 6. Place the mouthpiece in your mouth and seal your lips tightly around it. 7. Breathe in slowly and as deeply as possible, raising the piston or the ball toward the top of the column. 8. Hold your breath for 3-5 seconds or for as long as possible. Allow the piston or ball to fall to the bottom of the column. 9. Remove the mouthpiece from your mouth and breathe out normally. 10. Rest for a few seconds and repeat Steps 1 through 7 at least 10 times every 1-2 hours when you are awake. Take your time and take a few normal breaths between deep breaths. 11. The spirometer may include an indicator to show your best effort. Use the indicator as a goal to work toward during each repetition. 12. After each set of 10 deep breaths, practice coughing to be sure your  lungs are clear. If you have an incision (the cut made at the time of surgery), support your incision when coughing by placing a pillow or rolled up towels firmly against it. Once you are able to get out of bed, walk around indoors and cough well. You may stop using the incentive spirometer when instructed by your caregiver.  RISKS AND COMPLICATIONS  Take your time so you do not get dizzy or light-headed.  If you are in pain, you may need to take or ask for pain medication before doing incentive spirometry. It is harder to take a deep breath if you are having pain. AFTER USE  Rest and breathe slowly and easily.  It can be helpful to  keep track of a log of your progress. Your caregiver can provide you with a simple table to help with this. If you are using the spirometer at home, follow these instructions: [place] IF:   You are having difficultly using the spirometer.  You have trouble using the spirometer as often as instructed.  Your pain medication is not giving enough relief while using the spirometer.  You develop fever of 100.5 F (38.1 C) or higher. SEEK IMMEDIATE MEDICAL CARE IF:   You cough up bloody sputum that had not been present before.  You develop fever of 102 F (38.9 C) or greater.  You develop worsening pain at or near the incision site. MAKE SURE YOU:   Understand these instructions.  Will watch your condition.  Will get help right away if you are not doing well or get worse. Document Released: 09/13/2006 Document Revised: 07/26/2011 Document Reviewed: 11/14/2006 [facility] Patient Information 2014 [place], [place].   ________________________________________________________________________

## 2014-08-19 ENCOUNTER — Encounter (HOSPITAL_COMMUNITY): Payer: Self-pay

## 2014-08-19 ENCOUNTER — Encounter (HOSPITAL_COMMUNITY)
Admission: RE | Admit: 2014-08-19 | Discharge: 2014-08-19 | Disposition: A | Discharge status: Home / Self Care | Payer: Managed Care, Other (non HMO) | Source: Ambulatory Visit | Attending: Orthopedic Surgery | Admitting: Orthopedic Surgery

## 2014-08-19 DIAGNOSIS — Z01812 Encounter for preprocedural laboratory examination: Secondary | ICD-10-CM | POA: Insufficient documentation

## 2014-08-19 LAB — CBC
HCT: 42.5 % (ref 39.0–52.0)
Hemoglobin: 14.3 g/dL (ref 13.0–17.0)
MCH: 34.6 pg — AB (ref 26.0–34.0)
MCHC: 33.6 g/dL (ref 30.0–36.0)
MCV: 102.9 fL — ABNORMAL HIGH (ref 78.0–100.0)
PLATELETS: 224 10*3/uL (ref 150–400)
RBC: 4.13 MIL/uL — ABNORMAL LOW (ref 4.22–5.81)
RDW: 13.4 % (ref 11.5–15.5)
WBC: 6 10*3/uL (ref 4.0–10.5)

## 2014-08-19 LAB — URINALYSIS, ROUTINE W REFLEX MICROSCOPIC
Bilirubin Urine: NEGATIVE
GLUCOSE, UA: NEGATIVE mg/dL
HGB URINE DIPSTICK: NEGATIVE
Ketones, ur: NEGATIVE mg/dL
LEUKOCYTES UA: NEGATIVE
NITRITE: NEGATIVE
PROTEIN: NEGATIVE mg/dL
Specific Gravity, Urine: 1.006 (ref 1.005–1.030)
Urobilinogen, UA: 0.2 mg/dL (ref 0.0–1.0)
pH: 7 (ref 5.0–8.0)

## 2014-08-19 LAB — BASIC METABOLIC PANEL
ANION GAP: 11 (ref 5–15)
BUN: 17 mg/dL (ref 6–23)
CO2: 29 mmol/L (ref 19–32)
Calcium: 9.2 mg/dL (ref 8.4–10.5)
Chloride: 100 mmol/L (ref 96–112)
Creatinine, Ser: 0.76 mg/dL (ref 0.50–1.35)
GFR calc non Af Amer: >90 mL/min (ref >90)
Glucose, Bld: 144 mg/dL — ABNORMAL HIGH (ref 70–99)
POTASSIUM: 4.2 mmol/L (ref 3.5–5.1)
SODIUM: 140 mmol/L (ref 135–145)

## 2014-08-19 LAB — SURGICAL PCR SCREEN
MRSA, PCR: NEGATIVE
Staphylococcus aureus: NEGATIVE

## 2014-08-19 LAB — APTT: APTT: 31 s (ref 24–37)

## 2014-08-19 LAB — PROTIME-INR
INR: 1.01 (ref 0.00–1.49)
PROTHROMBIN TIME: 13.4 s (ref 11.6–15.2)

## 2014-08-19 NOTE — Progress Notes (Signed)
Clearance - Dr Sherwood GamblerFusco- on chart- dated 03/21/2014

## 2014-08-22 NOTE — H&P (Signed)
TOTAL HIP ADMISSION H&P  Patient is admitted for left total hip arthroplasty, anterior approach.  Subjective:  Chief Complaint:   Left hip avascular necrosis / pain  HPI: Howard [name], 49 y.o. male, has a history of pain and functional disability in the left hip(s) due to arthritis and avascular necrosis and patient has failed non-surgical conservative treatments for greater than 12 weeks to include NSAID's and/or analgesics, use of assistive devices and activity modification.  Onset of symptoms was gradual starting 5 months ago with rapidlly worsening course since that time.The patient noted no past surgery on the left hip, previous right THA per [name] in 2010.  Patient currently rates pain in the left hip at 8 out of 10 with activity. Patient has night pain, worsening of pain with activity and weight bearing, trendelenberg gait, pain that interfers with activities of daily living and pain with passive range of motion. Patient has evidence of periarticular osteophytes, joint space narrowing and avascular necrosis by imaging studies. This condition presents safety issues increasing the risk of falls.  There is no current active infection.  PCP: Howard [name],Howard J., MD  D/C Plans:      Home with HHPT  Post-op Meds:       No Rx given   Tranexamic Acid:      To be given - IV   Decadron:      Is to be given  FYI:     ASA post-op  Norco post-op   Past Medical History  Diagnosis Date  . Psoriasis   . Osteoarthritis   . Anxiety     Past Surgical History  Procedure Laterality Date  . Total hip arthroplasty    . Appendectomy      No prescriptions prior to admission   No Known Allergies   History  Substance Use Topics  . Smoking status: Current Every Day Smoker -- 0.50 packs/day for 30 years    Types: Cigarettes  . Smokeless tobacco: Never Used  . Alcohol Use: 8.4 oz/week    14 Cans of beer per week       Review of Systems  Constitutional: Negative.   HENT: Negative.   Eyes:  Negative.   Respiratory: Negative.   Cardiovascular: Negative.   Gastrointestinal: Negative.   Genitourinary: Negative.   Musculoskeletal: Positive for joint pain.  Skin: Negative.   Neurological: Negative.   Endo/Heme/Allergies: Negative.   Psychiatric/Behavioral: The patient is nervous/anxious.     Objective:  Physical Exam  Constitutional: He is oriented to person, place, and time. He appears well-developed and well-nourished.  HENT:  Head: Normocephalic.  Eyes: Pupils are equal, round, and reactive to light.  Neck: Neck supple. No JVD present. No tracheal deviation present. No thyromegaly present.  Cardiovascular: Normal rate, regular rhythm, normal heart sounds and intact distal pulses.   Respiratory: Effort normal and breath sounds normal. No stridor. No respiratory distress. He has no wheezes.  GI: Soft. There is no tenderness. There is no guarding.  Musculoskeletal:       Left hip: He exhibits decreased range of motion, decreased strength, tenderness and bony tenderness. He exhibits no swelling, no deformity and no laceration.  Lymphadenopathy:    He has no cervical adenopathy.  Neurological: He is alert and oriented to person, place, and time.  Skin: Skin is warm and dry.  Psychiatric: He has a normal mood and affect.      Labs:  Estimated body mass index is 31.19 kg/(m^2) as calculated from the following:  Height as of 03/20/13: 6' (1.829 m).   Weight as of 03/20/13: 104.327 kg (230 lb).   Imaging Review Plain radiographs demonstrate severe degenerative joint disease of the left hip(s). The bone quality appears to be good for age and reported activity level.  Assessment/Plan:  Avascular necrosis, left hip(s)  The patient history, physical examination, clinical judgement of the provider and imaging studies are consistent with end stage degenerative joint disease / avascular necrosis of the left hip(s) and total hip arthroplasty is deemed medically necessary.  The treatment options including medical management, injection therapy, arthroscopy and arthroplasty were discussed at length. The risks and benefits of total hip arthroplasty were presented and reviewed. The risks due to aseptic loosening, infection, stiffness, dislocation/subluxation,  thromboembolic complications and other imponderables were discussed.  The patient acknowledged the explanation, agreed to proceed with the plan and consent was signed. Patient is being admitted for inpatient treatment for surgery, pain control, PT, OT, prophylactic antibiotics, VTE prophylaxis, progressive ambulation and ADL's and discharge planning.The patient is planning to be discharged home with home health services.    Howard Auerbach Hyden Soley   PA-C  08/22/2014, 10:12 PM

## 2014-08-27 ENCOUNTER — Inpatient Hospital Stay (HOSPITAL_COMMUNITY): Payer: Managed Care, Other (non HMO) | Admitting: Anesthesiology

## 2014-08-27 ENCOUNTER — Inpatient Hospital Stay (HOSPITAL_COMMUNITY): Payer: Managed Care, Other (non HMO)

## 2014-08-27 ENCOUNTER — Encounter (HOSPITAL_COMMUNITY)
Admission: RE | Disposition: A | Discharge status: Home Health | Payer: Self-pay | Source: Ambulatory Visit | Attending: Orthopedic Surgery

## 2014-08-27 ENCOUNTER — Inpatient Hospital Stay (HOSPITAL_COMMUNITY)
Admission: RE | Admit: 2014-08-27 | Discharge: 2014-08-28 | DRG: 470 | Disposition: A | Discharge status: Home Health | Payer: Managed Care, Other (non HMO) | Source: Ambulatory Visit | Attending: Orthopedic Surgery | Admitting: Orthopedic Surgery

## 2014-08-27 ENCOUNTER — Encounter (HOSPITAL_COMMUNITY): Payer: Self-pay | Admitting: *Deleted

## 2014-08-27 DIAGNOSIS — Z96641 Presence of right artificial hip joint: Secondary | ICD-10-CM | POA: Diagnosis present

## 2014-08-27 DIAGNOSIS — M879 Osteonecrosis, unspecified: Principal | ICD-10-CM | POA: Diagnosis present

## 2014-08-27 DIAGNOSIS — M25552 Pain in left hip: Secondary | ICD-10-CM | POA: Diagnosis present

## 2014-08-27 DIAGNOSIS — M1612 Unilateral primary osteoarthritis, left hip: Secondary | ICD-10-CM | POA: Diagnosis present

## 2014-08-27 DIAGNOSIS — Z6832 Body mass index (BMI) 32.0-32.9, adult: Secondary | ICD-10-CM | POA: Diagnosis not present

## 2014-08-27 DIAGNOSIS — F17210 Nicotine dependence, cigarettes, uncomplicated: Secondary | ICD-10-CM | POA: Diagnosis present

## 2014-08-27 DIAGNOSIS — Z96649 Presence of unspecified artificial hip joint: Secondary | ICD-10-CM

## 2014-08-27 DIAGNOSIS — E669 Obesity, unspecified: Secondary | ICD-10-CM | POA: Diagnosis present

## 2014-08-27 HISTORY — PX: TOTAL HIP ARTHROPLASTY: SHX124

## 2014-08-27 LAB — TYPE AND SCREEN
ABO/RH(D): A NEG
ANTIBODY SCREEN: NEGATIVE

## 2014-08-27 SURGERY — ARTHROPLASTY, HIP, TOTAL, ANTERIOR APPROACH
Anesthesia: Spinal | Site: Hip | Laterality: Left

## 2014-08-27 MED ORDER — LACTATED RINGERS IV SOLN: INTRAVENOUS | Status: DC

## 2014-08-27 MED ORDER — SODIUM CHLORIDE 0.9 % IV SOLN
100.0000 mL/h | INTRAVENOUS | Status: DC
  Administered 2014-08-27: 100 mL/h via INTRAVENOUS
  Filled 2014-08-27 (×3): qty 1000

## 2014-08-27 MED ORDER — HYDROMORPHONE HCL 1 MG/ML IJ SOLN
INTRAMUSCULAR | Status: AC
  Filled 2014-08-27: qty 1

## 2014-08-27 MED ORDER — ASPIRIN EC 325 MG PO TBEC
325.0000 mg | DELAYED_RELEASE_TABLET | Freq: Two times a day (BID) | ORAL | Status: DC
  Administered 2014-08-28: 325 mg via ORAL
  Filled 2014-08-27 (×3): qty 1

## 2014-08-27 MED ORDER — DOCUSATE SODIUM 100 MG PO CAPS
100.0000 mg | ORAL_CAPSULE | Freq: Two times a day (BID) | ORAL | Status: DC
  Administered 2014-08-27 – 2014-08-28 (×2): 100 mg via ORAL

## 2014-08-27 MED ORDER — DEXAMETHASONE SODIUM PHOSPHATE 10 MG/ML IJ SOLN
10.0000 mg | Freq: Once | INTRAMUSCULAR | Status: AC
  Administered 2014-08-28: 10 mg via INTRAVENOUS
  Filled 2014-08-27: qty 1

## 2014-08-27 MED ORDER — EPHEDRINE SULFATE 50 MG/ML IJ SOLN
INTRAMUSCULAR | Status: AC
  Filled 2014-08-27: qty 1

## 2014-08-27 MED ORDER — ONDANSETRON HCL 4 MG/2ML IJ SOLN
4.0000 mg | Freq: Four times a day (QID) | INTRAMUSCULAR | Status: DC | PRN
Start: 2014-08-27 — End: 2014-08-28

## 2014-08-27 MED ORDER — HYDROMORPHONE HCL 1 MG/ML IJ SOLN
0.5000 mg | INTRAMUSCULAR | Status: DC | PRN
  Administered 2014-08-27 (×2): 1 mg via INTRAVENOUS
  Filled 2014-08-27 (×2): qty 1

## 2014-08-27 MED ORDER — CEFAZOLIN SODIUM-DEXTROSE 2-3 GM-% IV SOLR
INTRAVENOUS | Status: AC
  Filled 2014-08-27: qty 50

## 2014-08-27 MED ORDER — ONDANSETRON HCL 4 MG/2ML IJ SOLN
INTRAMUSCULAR | Status: AC
  Filled 2014-08-27: qty 2

## 2014-08-27 MED ORDER — DEXAMETHASONE SODIUM PHOSPHATE 10 MG/ML IJ SOLN
10.0000 mg | Freq: Once | INTRAMUSCULAR | Status: AC
  Administered 2014-08-27: 10 mg via INTRAVENOUS

## 2014-08-27 MED ORDER — PROPOFOL 10 MG/ML IV BOLUS
INTRAVENOUS | Status: AC
  Filled 2014-08-27: qty 20

## 2014-08-27 MED ORDER — METHOCARBAMOL 500 MG PO TABS
500.0000 mg | ORAL_TABLET | Freq: Four times a day (QID) | ORAL | Status: DC | PRN
  Administered 2014-08-27 – 2014-08-28 (×3): 500 mg via ORAL
  Filled 2014-08-27 (×3): qty 1

## 2014-08-27 MED ORDER — CEFAZOLIN SODIUM-DEXTROSE 2-3 GM-% IV SOLR
2.0000 g | Freq: Four times a day (QID) | INTRAVENOUS | Status: AC
  Administered 2014-08-27 (×2): 2 g via INTRAVENOUS
  Filled 2014-08-27 (×2): qty 50

## 2014-08-27 MED ORDER — METOCLOPRAMIDE HCL 5 MG/ML IJ SOLN: 5.0000 mg | Freq: Three times a day (TID) | INTRAMUSCULAR | Status: DC | PRN

## 2014-08-27 MED ORDER — ALPRAZOLAM 0.5 MG PO TABS
0.5000 mg | ORAL_TABLET | Freq: Three times a day (TID) | ORAL | Status: DC | PRN
Start: 2014-08-27 — End: 2014-08-28

## 2014-08-27 MED ORDER — DIPHENHYDRAMINE HCL 25 MG PO CAPS: 25.0000 mg | ORAL_CAPSULE | Freq: Four times a day (QID) | ORAL | Status: DC | PRN

## 2014-08-27 MED ORDER — METOCLOPRAMIDE HCL 5 MG PO TABS
5.0000 mg | ORAL_TABLET | Freq: Three times a day (TID) | ORAL | Status: DC | PRN
  Filled 2014-08-27: qty 2

## 2014-08-27 MED ORDER — ONDANSETRON HCL 4 MG PO TABS: 4.0000 mg | ORAL_TABLET | Freq: Four times a day (QID) | ORAL | Status: DC | PRN

## 2014-08-27 MED ORDER — CHLORHEXIDINE GLUCONATE 4 % EX LIQD
60.0000 mL | Freq: Once | CUTANEOUS | Status: DC
Start: 2014-08-27 — End: 2014-08-27

## 2014-08-27 MED ORDER — MENTHOL 3 MG MT LOZG: 1.0000 | LOZENGE | OROMUCOSAL | Status: DC | PRN

## 2014-08-27 MED ORDER — PROPOFOL INFUSION 10 MG/ML OPTIME
INTRAVENOUS | Status: DC | PRN
  Administered 2014-08-27: 140 ug/kg/min via INTRAVENOUS

## 2014-08-27 MED ORDER — DEXAMETHASONE SODIUM PHOSPHATE 10 MG/ML IJ SOLN
INTRAMUSCULAR | Status: AC
  Filled 2014-08-27: qty 1

## 2014-08-27 MED ORDER — SODIUM CHLORIDE 0.9 % IR SOLN
Status: DC | PRN
  Administered 2014-08-27: 1000 mL

## 2014-08-27 MED ORDER — FERROUS SULFATE 325 (65 FE) MG PO TABS
325.0000 mg | ORAL_TABLET | Freq: Three times a day (TID) | ORAL | Status: DC
  Administered 2014-08-27 – 2014-08-28 (×2): 325 mg via ORAL
  Filled 2014-08-27 (×5): qty 1

## 2014-08-27 MED ORDER — ONDANSETRON HCL 4 MG/2ML IJ SOLN
INTRAMUSCULAR | Status: DC | PRN
  Administered 2014-08-27: 4 mg via INTRAVENOUS

## 2014-08-27 MED ORDER — BISACODYL 10 MG RE SUPP: 10.0000 mg | Freq: Every day | RECTAL | Status: DC | PRN

## 2014-08-27 MED ORDER — METHOCARBAMOL 1000 MG/10ML IJ SOLN
500.0000 mg | Freq: Four times a day (QID) | INTRAVENOUS | Status: DC | PRN
  Administered 2014-08-27: 500 mg via INTRAVENOUS
  Filled 2014-08-27 (×2): qty 5

## 2014-08-27 MED ORDER — CELECOXIB 200 MG PO CAPS
200.0000 mg | ORAL_CAPSULE | Freq: Two times a day (BID) | ORAL | Status: DC
  Administered 2014-08-27 – 2014-08-28 (×2): 200 mg via ORAL
  Filled 2014-08-27 (×3): qty 1

## 2014-08-27 MED ORDER — MIDAZOLAM HCL 5 MG/5ML IJ SOLN
INTRAMUSCULAR | Status: DC | PRN
  Administered 2014-08-27: 2 mg via INTRAVENOUS

## 2014-08-27 MED ORDER — HYDROMORPHONE HCL 1 MG/ML IJ SOLN
0.2500 mg | INTRAMUSCULAR | Status: DC | PRN
  Administered 2014-08-27 (×3): 0.5 mg via INTRAVENOUS

## 2014-08-27 MED ORDER — MAGNESIUM CITRATE PO SOLN: 1.0000 | Freq: Once | ORAL | Status: AC | PRN

## 2014-08-27 MED ORDER — SODIUM CHLORIDE 0.9 % IJ SOLN
INTRAMUSCULAR | Status: AC
  Filled 2014-08-27: qty 10

## 2014-08-27 MED ORDER — CEFAZOLIN SODIUM-DEXTROSE 2-3 GM-% IV SOLR
2.0000 g | INTRAVENOUS | Status: AC
  Administered 2014-08-27: 2 g via INTRAVENOUS

## 2014-08-27 MED ORDER — MIDAZOLAM HCL 2 MG/2ML IJ SOLN
INTRAMUSCULAR | Status: AC
  Filled 2014-08-27: qty 2

## 2014-08-27 MED ORDER — FENTANYL CITRATE 0.05 MG/ML IJ SOLN
INTRAMUSCULAR | Status: AC
Start: 2014-08-27 — End: 2014-08-27
  Filled 2014-08-27: qty 2

## 2014-08-27 MED ORDER — BUPIVACAINE IN DEXTROSE 0.75-8.25 % IT SOLN
INTRATHECAL | Status: DC | PRN
  Administered 2014-08-27: 2 mL via INTRATHECAL

## 2014-08-27 MED ORDER — PROPOFOL 10 MG/ML IV BOLUS
INTRAVENOUS | Status: DC | PRN
  Administered 2014-08-27 (×2): 20 mg via INTRAVENOUS
  Administered 2014-08-27: 50 mg via INTRAVENOUS

## 2014-08-27 MED ORDER — ALUM & MAG HYDROXIDE-SIMETH 200-200-20 MG/5ML PO SUSP: 30.0000 mL | ORAL | Status: DC | PRN

## 2014-08-27 MED ORDER — FENTANYL CITRATE 0.05 MG/ML IJ SOLN
INTRAMUSCULAR | Status: DC | PRN
  Administered 2014-08-27: 100 ug via INTRAVENOUS

## 2014-08-27 MED ORDER — LIDOCAINE HCL 1 % IJ SOLN
INTRAMUSCULAR | Status: AC
  Filled 2014-08-27: qty 20

## 2014-08-27 MED ORDER — LACTATED RINGERS IV SOLN
INTRAVENOUS | Status: DC
  Administered 2014-08-27: 1000 mL via INTRAVENOUS
  Administered 2014-08-27: 12:00:00 via INTRAVENOUS

## 2014-08-27 MED ORDER — POLYETHYLENE GLYCOL 3350 17 G PO PACK
17.0000 g | PACK | Freq: Two times a day (BID) | ORAL | Status: DC
  Administered 2014-08-28: 17 g via ORAL

## 2014-08-27 MED ORDER — HYDROCODONE-ACETAMINOPHEN 7.5-325 MG PO TABS
1.0000 | ORAL_TABLET | ORAL | Status: DC
  Administered 2014-08-27 – 2014-08-28 (×5): 2 via ORAL
  Filled 2014-08-27 (×5): qty 2

## 2014-08-27 MED ORDER — TRANEXAMIC ACID 100 MG/ML IV SOLN
1000.0000 mg | Freq: Once | INTRAVENOUS | Status: AC
  Administered 2014-08-27: 1000 mg via INTRAVENOUS
  Filled 2014-08-27: qty 10

## 2014-08-27 MED ORDER — PHENOL 1.4 % MT LIQD: 1.0000 | OROMUCOSAL | Status: DC | PRN

## 2014-08-27 SURGICAL SUPPLY — 44 items
BAG DECANTER FOR FLEXI CONT (MISCELLANEOUS) IMPLANT
BAG ZIPLOCK 12X15 (MISCELLANEOUS) IMPLANT
CAPT HIP TOTAL 2 ×2 IMPLANT
COVER PERINEAL POST (MISCELLANEOUS) ×2 IMPLANT
DERMABOND ADVANCED (GAUZE/BANDAGES/DRESSINGS) ×1
DERMABOND ADVANCED .7 DNX12 (GAUZE/BANDAGES/DRESSINGS) ×1 IMPLANT
DRAPE C-ARM 42X120 X-RAY (DRAPES) ×2 IMPLANT
DRAPE STERI IOBAN 125X83 (DRAPES) ×2 IMPLANT
DRAPE U-SHAPE 47X51 STRL (DRAPES) ×6 IMPLANT
DRSG AQUACEL AG ADV 3.5X10 (GAUZE/BANDAGES/DRESSINGS) ×2 IMPLANT
DURAPREP 26ML APPLICATOR (WOUND CARE) ×2 IMPLANT
ELECT BLADE TIP CTD 4 INCH (ELECTRODE) ×2 IMPLANT
ELECT PENCIL ROCKER SW 15FT (MISCELLANEOUS) IMPLANT
ELECT REM PT RETURN 15FT ADLT (MISCELLANEOUS) IMPLANT
ELECT REM PT RETURN 9FT ADLT (ELECTROSURGICAL) ×2
ELECTRODE REM PT RTRN 9FT ADLT (ELECTROSURGICAL) ×1 IMPLANT
FACESHIELD WRAPAROUND (MASK) ×8 IMPLANT
GLOVE BIOGEL PI IND STRL 7.5 (GLOVE) ×1 IMPLANT
GLOVE BIOGEL PI IND STRL 8.5 (GLOVE) IMPLANT
GLOVE BIOGEL PI INDICATOR 7.5 (GLOVE) ×1
GLOVE BIOGEL PI INDICATOR 8.5 (GLOVE)
GLOVE ECLIPSE 8.0 STRL XLNG CF (GLOVE) IMPLANT
GLOVE ORTHO TXT STRL SZ7.5 (GLOVE) ×4 IMPLANT
GOWN SPEC L3 XXLG W/TWL (GOWN DISPOSABLE) ×2 IMPLANT
GOWN STRL REUS W/TWL LRG LVL3 (GOWN DISPOSABLE) ×2 IMPLANT
HOLDER FOLEY CATH W/STRAP (MISCELLANEOUS) ×2 IMPLANT
KIT BASIN OR (CUSTOM PROCEDURE TRAY) ×2 IMPLANT
LIQUID BAND (GAUZE/BANDAGES/DRESSINGS) ×2 IMPLANT
NDL SAFETY ECLIPSE 18X1.5 (NEEDLE) IMPLANT
NEEDLE HYPO 18GX1.5 SHARP (NEEDLE)
PACK TOTAL JOINT (CUSTOM PROCEDURE TRAY) ×2 IMPLANT
PEN SKIN MARKING BROAD (MISCELLANEOUS) ×2 IMPLANT
SAW OSC TIP CART 19.5X105X1.3 (SAW) ×2 IMPLANT
SUT MNCRL AB 4-0 PS2 18 (SUTURE) ×2 IMPLANT
SUT VIC AB 1 CT1 36 (SUTURE) ×6 IMPLANT
SUT VIC AB 2-0 CT1 27 (SUTURE) ×3
SUT VIC AB 2-0 CT1 TAPERPNT 27 (SUTURE) ×3 IMPLANT
SUT VLOC 180 0 24IN GS25 (SUTURE) ×2 IMPLANT
SYR 50ML LL SCALE MARK (SYRINGE) IMPLANT
TOWEL OR 17X26 10 PK STRL BLUE (TOWEL DISPOSABLE) ×2 IMPLANT
TOWEL OR NON WOVEN STRL DISP B (DISPOSABLE) ×2 IMPLANT
TRAY FOLEY CATH 14FRSI W/METER (CATHETERS) ×2 IMPLANT
WATER STERILE IRR 1500ML POUR (IV SOLUTION) ×2 IMPLANT
YANKAUER SUCT BULB TIP 10FT TU (MISCELLANEOUS) ×2 IMPLANT

## 2014-08-27 NOTE — Progress Notes (Signed)
X-ray results noted 

## 2014-08-27 NOTE — Anesthesia Postprocedure Evaluation (Signed)
  Anesthesia Post-op Note  Patient: Howard CandelaJohn R [name]  Procedure(s) Performed: Procedure(s) (LRB): LEFT TOTAL HIP ARTHROPLASTY ANTERIOR APPROACH (Left)  Patient Location: PACU  Anesthesia Type: Spinal  Level of Consciousness: awake and alert   Airway and Oxygen Therapy: Patient Spontanous Breathing  Post-op Pain: mild  Post-op Assessment: Post-op Vital signs reviewed, Patient's Cardiovascular Status Stable, Respiratory Function Stable, Patent Airway and No signs of Nausea or vomiting  Last Vitals:  Filed Vitals:   08/27/14 1415  BP: 113/76  Pulse: 64  Temp:   Resp: 12    Post-op Vital Signs: stable   Complications: No apparent anesthesia complications

## 2014-08-27 NOTE — Interval H&P Note (Signed)
History and Physical Interval Note:  08/27/2014 10:07 AM  Howard CandelaJohn R Pritchard  has presented today for surgery, with the diagnosis of LEFT HIP AVN  The various methods of treatment have been discussed with the patient and family. After consideration of risks, benefits and other options for treatment, the patient has consented to  Procedure(s): LEFT TOTAL HIP ARTHROPLASTY ANTERIOR APPROACH (Left) as a surgical intervention .  The patient's history has been reviewed, patient examined, no change in status, stable for surgery.  I have reviewed the patient's chart and labs.  Questions were answered to the patient's satisfaction.     Shelda PalLIN,Linnell Swords D

## 2014-08-27 NOTE — Anesthesia Procedure Notes (Signed)
Spinal Patient location during procedure: OR Start time: 08/27/2014 11:18 AM End time: 08/27/2014 11:25 AM Staffing Resident/CRNA: Nhyira Leano L Performed by: resident/CRNA  Preanesthetic Checklist Completed: patient identified, site marked, surgical consent, pre-op evaluation, timeout performed, IV checked, risks and benefits discussed and monitors and equipment checked Spinal Block Patient position: sitting Prep: Betadine Patient monitoring: continuous pulse ox, blood pressure and heart rate Approach: midline Location: L3-4 Injection technique: single-shot Needle Needle type: Sprotte  Needle gauge: 24 G Needle length: 9 cm Assessment Sensory level: T6 Additional Notes Kit expiration 01/2016 and Lot # [id number] CSF clear, negative heme, negative paresthesia Returned to supine and tolerated well

## 2014-08-27 NOTE — Progress Notes (Signed)
Portable AP Pelvis and Lateral Left Hip X-rays done. 

## 2014-08-27 NOTE — Anesthesia Preprocedure Evaluation (Signed)
Anesthesia Evaluation  Patient identified by MRN, date of birth, ID band Patient awake    Reviewed: Allergy & Precautions, H&P , NPO status , Patient's Chart, lab work & pertinent test results  Airway Mallampati: II  TM Distance: >3 FB Neck ROM: full    Dental  (+) Caps, Dental Advisory Given 3 upper front teeth capped:   Pulmonary Current Smoker,  breath sounds clear to auscultation  Pulmonary exam normal       Cardiovascular Exercise Tolerance: Good negative cardio ROS  Rhythm:regular Rate:Normal     Neuro/Psych negative neurological ROS  negative psych ROS   GI/Hepatic negative GI ROS, Neg liver ROS,   Endo/Other  negative endocrine ROS  Renal/GU negative Renal ROS  negative genitourinary   Musculoskeletal   Abdominal   Peds  Hematology negative hematology ROS (+)   Anesthesia Other Findings   Reproductive/Obstetrics negative OB ROS                             Anesthesia Physical Anesthesia Plan  ASA: II  Anesthesia Plan: Spinal   Post-op Pain Management:    Induction:   Airway Management Planned:   Additional Equipment:   Intra-op Plan:   Post-operative Plan:   Informed Consent: I have reviewed the patients History and Physical, chart, labs and discussed the procedure including the risks, benefits and alternatives for the proposed anesthesia with the patient or authorized representative who has indicated his/her understanding and acceptance.   Dental Advisory Given  Plan Discussed with: CRNA and Surgeon  Anesthesia Plan Comments:         Anesthesia Quick Evaluation

## 2014-08-27 NOTE — Evaluation (Signed)
Physical Therapy Evaluation Patient Details Name: Howard FowlerJohn R [name] MRN: [id number] DOB: May 26, 1965 Today's Date: 08/27/2014   History of Present Illness  49 yo male s/p L THA-direct anterior 08/27/14.   Clinical Impression  On eva POD 0, pt required Min assist for mobility-able to ambulate ~50 feet with RW. Pt tolerated activity fairly well. Pt is highly motivated to mobilize and d/c.     Follow Up Recommendations Home health PT    Equipment Recommendations  None recommended by PT    Recommendations for Other Services OT consult     Precautions / Restrictions Restrictions Weight Bearing Restrictions: No LLE Weight Bearing: Weight bearing as tolerated      Mobility  Bed Mobility Overal bed mobility: Needs Assistance Bed Mobility: Supine to Sit     Supine to sit: Min assist     General bed mobility comments: Assist for L LE. Increased time.   Transfers Overall transfer level: Needs assistance Equipment used: Rolling walker (2 wheeled) Transfers: Sit to/from Stand Sit to Stand: Min assist         General transfer comment: Assist to rise, stabilize, control descent. VCs safety, technique, hand placement  Ambulation/Gait Ambulation/Gait assistance: Min guard Ambulation Distance (Feet): 50 Feet Assistive device: Rolling walker (2 wheeled) Gait Pattern/deviations: Step-to pattern;Decreased stride length;Step-through pattern;Decreased step length - left     General Gait Details: close guard for safety.   Stairs            Wheelchair Mobility    Modified Rankin (Stroke Patients Only)       Balance                                             Pertinent Vitals/Pain Pain Assessment: 0-10 Pain Score: 7  Pain Location: L hip/thigh  Pain Descriptors / Indicators: Aching;Sore Pain Intervention(s): Monitored during session;Repositioned    Home Living Family/patient expects to be discharged to:: Private residence Living Arrangements:  Spouse/significant other Available Help at Discharge: Family Type of Home: House Home Access: Level entry     Home Layout: One level Home Equipment: Environmental consultantWalker - 2 wheels;Bedside commode      Prior Function Level of Independence: Independent               Hand Dominance        Extremity/Trunk Assessment   Upper Extremity Assessment: Defer to OT evaluation           Lower Extremity Assessment: LLE deficits/detail   LLE Deficits / Details: moves ankle well. hip flex at least 2/5.  Cervical / Trunk Assessment: Normal  Communication   Communication: No difficulties  Cognition Arousal/Alertness: Awake/alert Behavior During Therapy: WFL for tasks assessed/performed Overall Cognitive Status: Within Functional Limits for tasks assessed                      General Comments      Exercises        Assessment/Plan    PT Assessment Patient needs continued PT services  PT Diagnosis Difficulty walking;Acute pain   PT Problem List Decreased strength;Decreased range of motion;Decreased balance;Decreased activity tolerance;Decreased mobility;Pain;Decreased knowledge of use of DME  PT Treatment Interventions DME instruction;Gait training;Functional mobility training;Therapeutic activities;Therapeutic exercise;Patient/family education   PT Goals (Current goals can be found in the Care Plan section) Acute Rehab PT Goals Patient Stated Goal: home soon. regain independence  PT Goal Formulation: With patient Time For Goal Achievement: 09/03/14 Potential to Achieve Goals: Good    Frequency 7X/week   Barriers to discharge        Co-evaluation               End of Session   Activity Tolerance: Patient tolerated treatment well Patient left: in chair;with call bell/phone within reach;with family/visitor present           Time: 1610-9604 PT Time Calculation (min) (ACUTE ONLY): 11 min   Charges:   PT Evaluation $Initial PT Evaluation Tier I: 1  Procedure     PT G Codes:        Rebeca Alert, MPT Pager: [phone]

## 2014-08-27 NOTE — Transfer of Care (Signed)
Immediate Anesthesia Transfer of Care Note  Patient: Howard CandelaJohn R Beutler  Procedure(s) Performed: Procedure(s): LEFT TOTAL HIP ARTHROPLASTY ANTERIOR APPROACH (Left)  Patient Location: PACU  Anesthesia Type:Spinal  Level of Consciousness: awake, alert  and oriented  Airway & Oxygen Therapy: Patient Spontanous Breathing and Patient connected to face mask oxygen  Post-op Assessment: Report given to RN and Post -op Vital signs reviewed and stable  Post vital signs: Reviewed and stable  Last Vitals:  Filed Vitals:   08/27/14 0856  BP: 134/90  Pulse: 76  Temp: 36.5 C  Resp: 18    Complications: No apparent anesthesia complications

## 2014-08-27 NOTE — Op Note (Signed)
NAME:  Howard FowlerJohn R [name]                ACCOUNT NO.: [id number]      MEDICAL RECORD NO.: [id number]      [facility]      PHYSICIAN:  Durene RomansLIN,Cornell Bourbon D  DATE OF BIRTH:  Jun 29, 1965     DATE OF PROCEDURE:  08/27/2014                                 OPERATIVE REPORT         PREOPERATIVE DIAGNOSIS: Left  hip osteoarthritis.      POSTOPERATIVE DIAGNOSIS:  Left hip osteoarthritis.  History of Right THR     PROCEDURE:  Left total hip replacement through an anterior approach   utilizing DePuy THR system, component size 54mm pinnacle cup, a size 36+4 neutral   Altrex liner, a size 8 Hi Tri Lock stem with a 36+5 delta ceramic   ball.      SURGEON:  Madlyn FrankelMatthew D. Charlann Boxerlin, M.D.      ASSISTANT:  Skip MayerBlair Roberts, PA-C      ANESTHESIA:  Spinal.      SPECIMENS:  None.      COMPLICATIONS:  None.      BLOOD LOSS:  500 cc     DRAINS:  None.      INDICATION OF THE PROCEDURE:  Howard [name] is a 49 y.o. male who had   presented to office for evaluation of left hip pain.  Radiographs revealed   progressive degenerative changes with bone-on-bone   articulation to the  hip joint.  The patient had painful limited range of   motion significantly affecting their overall quality of life.  The patient was failing to    respond to conservative measures, and at this point was ready   to proceed with more definitive measures.  The patient has noted progressive   degenerative changes in his hip, progressive problems and dysfunction   with regarding the hip prior to surgery.  Consent was obtained for   benefit of pain relief.  Specific risk of infection, DVT, component   failure, dislocation, need for revision surgery, as well discussion of   the anterior versus posterior approach were reviewed.  Consent was   obtained for benefit of anterior pain relief through an anterior   approach.      PROCEDURE IN DETAIL:  The patient was brought to operative theater.   Once adequate  anesthesia, preoperative antibiotics, 2gm of Ancef, 10mg  of Decadron administered.   The patient was positioned supine on the OSI Hanna table.  Once adequate   padding of boney process was carried out, we had predraped out the hip, and  used fluoroscopy to confirm orientation of the pelvis and position.      The left hip was then prepped and draped from proximal iliac crest to   mid thigh with shower curtain technique.      Time-out was performed identifying the patient, planned procedure, and   extremity.     An incision was then made 2 cm distal and lateral to the   anterior superior iliac spine extending over the orientation of the   tensor fascia lata muscle and sharp dissection was carried down to the   fascia of the muscle and protractor placed in the soft tissues.      The fascia was then incised.  The muscle belly was identified and swept   laterally and retractor placed along the superior neck.  Following   cauterization of the circumflex vessels and removing some pericapsular   fat, a second cobra retractor was placed on the inferior neck.  A third   retractor was placed on the anterior acetabulum after elevating the   anterior rectus.  A L-capsulotomy was along the line of the   superior neck to the trochanteric fossa, then extended proximally and   distally.  Tag sutures were placed and the retractors were then placed   intracapsular.  We then identified the trochanteric fossa and   orientation of my neck cut, confirmed this radiographically   and then made a neck osteotomy with the femur on traction.  The femoral   head was removed without difficulty or complication.  Traction was let   off and retractors were placed posterior and anterior around the   acetabulum.      The labrum and foveal tissue were debrided.  I began reaming with a 47mm   reamer and reamed up to 53mm reamer with good bony bed preparation and a 54mm   cup was chosen.  The final 54mm Pinnacle cup was  then impacted under fluoroscopy  to confirm the depth of penetration and orientation with respect to   abduction.  A screw was placed followed by the hole eliminator.  The final   36+4 neutral Altrex liner was impacted with good visualized rim fit.  The cup was positioned anatomically within the acetabular portion of the pelvis.      At this point, the femur was rolled at 80 degrees.  Further capsule was   released off the inferior aspect of the femoral neck.  I then   released the superior capsule proximally.  The hook was placed laterally   along the femur and elevated manually and held in position with the bed   hook.  The leg was then extended and adducted with the leg rolled to 100   degrees of external rotation.  Once the proximal femur was fully   exposed, I used a box osteotome to set orientation.  I then began   broaching with the starting chili pepper broach and passed this by hand and then broached up to 8.  With the 8 broach in place I chose a high offset neck and did a trial reduction.  The offset was appropriate, leg lengths   appeared to be equal best with the +5 head ball to match the right total hip previously performed.   Given these findings, I went ahead and dislocated the hip, repositioned all   retractors and positioned the right hip in the extended and abducted position.  The final 8Hi Tri Lock stem was   chosen and it was impacted down to the level of neck cut.  Based on this   and the trial reduction, a 36+5 delta ceramic ball was chosen and   impacted onto a clean and dry trunnion, and the hip was reduced.  The   hip had been irrigated throughout the case again at this point.  I did   reapproximate the superior capsular leaflet to the anterior leaflet   using #1 Vicryl.  The fascia of the   tensor fascia lata muscle was then reapproximated using #1 Vicryl and #0 V-lock sutures.  The   remaining wound was closed with 2-0 Vicryl and running 4-0 Monocryl.   The hip was  cleaned, dried,  and dressed sterilely using Dermabond and   Aquacel dressing.  He was then brought   to recovery room in stable condition tolerating the procedure well.    Skip Mayer, PA-C was present for the entirety of the case involved from   preoperative positioning, perioperative retractor management, general   facilitation of the case, as well as primary wound closure as assistant.            Madlyn Frankel Charlann Boxer, M.D.        08/27/2014 2:12 PM

## 2014-08-28 DIAGNOSIS — E669 Obesity, unspecified: Secondary | ICD-10-CM | POA: Diagnosis present

## 2014-08-28 LAB — BASIC METABOLIC PANEL
Anion gap: 4 — ABNORMAL LOW (ref 5–15)
BUN: 12 mg/dL (ref 6–23)
CALCIUM: 8.3 mg/dL — AB (ref 8.4–10.5)
CHLORIDE: 104 mmol/L (ref 96–112)
CO2: 29 mmol/L (ref 19–32)
Creatinine, Ser: 0.62 mg/dL (ref 0.50–1.35)
GFR calc non Af Amer: >90 mL/min (ref >90)
Glucose, Bld: 149 mg/dL — ABNORMAL HIGH (ref 70–99)
Potassium: 4.4 mmol/L (ref 3.5–5.1)
Sodium: 137 mmol/L (ref 135–145)

## 2014-08-28 LAB — CBC
HEMATOCRIT: 31.8 % — AB (ref 39.0–52.0)
Hemoglobin: 11 g/dL — ABNORMAL LOW (ref 13.0–17.0)
MCH: 35 pg — ABNORMAL HIGH (ref 26.0–34.0)
MCHC: 34.6 g/dL (ref 30.0–36.0)
MCV: 101.3 fL — AB (ref 78.0–100.0)
Platelets: 178 10*3/uL (ref 150–400)
RBC: 3.14 MIL/uL — AB (ref 4.22–5.81)
RDW: 12.8 % (ref 11.5–15.5)
WBC: 10.5 10*3/uL (ref 4.0–10.5)

## 2014-08-28 MED ORDER — POLYETHYLENE GLYCOL 3350 17 G PO PACK: 17.0000 g | PACK | Freq: Two times a day (BID) | ORAL | Status: DC

## 2014-08-28 MED ORDER — HYDROCODONE-ACETAMINOPHEN 7.5-325 MG PO TABS: 1.0000 | ORAL_TABLET | ORAL | Status: DC | PRN

## 2014-08-28 MED ORDER — ASPIRIN 325 MG PO TBEC: 325.0000 mg | DELAYED_RELEASE_TABLET | Freq: Two times a day (BID) | ORAL | Status: AC

## 2014-08-28 MED ORDER — DOCUSATE SODIUM 100 MG PO CAPS: 100.0000 mg | ORAL_CAPSULE | Freq: Two times a day (BID) | ORAL | Status: DC

## 2014-08-28 MED ORDER — FERROUS SULFATE 325 (65 FE) MG PO TABS: 325.0000 mg | ORAL_TABLET | Freq: Three times a day (TID) | ORAL | Status: DC

## 2014-08-28 MED ORDER — METHOCARBAMOL 500 MG PO TABS: 500.0000 mg | ORAL_TABLET | Freq: Four times a day (QID) | ORAL | Status: DC | PRN

## 2014-08-28 NOTE — Progress Notes (Signed)
     Subjective: 1 Day Post-Op Procedure(s) (LRB): LEFT TOTAL HIP ARTHROPLASTY ANTERIOR APPROACH (Left)   Patient reports pain as mild, pain controlled. No events throughout the night. Ready to be discharged home if he does well with PT and pain controlled.   Objective:   VITALS:   Filed Vitals:   08/28/14 0451  BP: 100/67  Pulse: 70  Temp: 98.9 F (37.2 C)  Resp: 18    Dorsiflexion/Plantar flexion intact Incision: dressing C/D/I No cellulitis present Compartment soft  LABS  Recent Labs  08/28/14 0445  HGB 11.0*  HCT 31.8*  WBC 10.5  PLT 178     Recent Labs  08/28/14 0445  NA 137  K 4.4  BUN 12  CREATININE 0.62  GLUCOSE 149*     Assessment/Plan: 1 Day Post-Op Procedure(s) (LRB): LEFT TOTAL HIP ARTHROPLASTY ANTERIOR APPROACH (Left) Foley cath d/c'ed Advance diet Up with therapy D/C IV fluids Discharge home with home health  [facility]. Follow up with OLIN,Damaria Vachon D in 2 weeks.  Contact information:  [facility] [address] [place] [id number] [phone]    Obese (BMI 30-39.9) Estimated body mass index is 32.95 kg/(m^2) as calculated from the following:   Height as of this encounter: 6' (1.829 m).   Weight as of this encounter: 110.224 kg (243 lb). Patient also counseled that weight may inhibit the healing process Patient counseled that losing weight will help with future health issues      Anastasio AuerbachMatthew S. Emonee Winkowski   PAC  08/28/2014, 9:39 AM

## 2014-08-28 NOTE — Progress Notes (Signed)
OT Cancellation Note  Patient Details Name: Howard FowlerJohn R Russi MRN: [id number] DOB: 17-Feb-1966   Cancelled Treatment:    Reason Eval/Treat Not Completed: OT screened, no needs identified, will sign off  Alnisa Hasley 08/28/2014, 11:36 AM  Marica OtterMaryellen Tanieka Pownall, OTR/L [phone] 08/28/2014

## 2014-08-28 NOTE — Discharge Instructions (Signed)

## 2014-08-28 NOTE — Plan of Care (Signed)
Problem: Phase III Progression Outcomes Goal: Anticoagulant follow-up in place Outcome: Not Applicable Date Met:  08/28/14 ASA for VTE, no f/u needed.     

## 2014-08-28 NOTE — Progress Notes (Addendum)
Physical Therapy Treatment Patient Details Name: Howard FowlerJohn R [name] MRN: [id number] DOB: 12-25-1965 Today's Date: 08/28/2014    History of Present Illness 49 yo male s/p L THA-direct anterior 08/27/14.     PT Comments    Progressing with mobility. Practiced ambulation and exercises. Pt reports he does not have any steps to climb to enter home. All education completed. Ready to d/c from PT standpoint-made RN aware.  Follow Up Recommendations  Home health PT     Equipment Recommendations  None recommended by PT    Recommendations for Other Services OT consult     Precautions / Restrictions Precautions Precautions: None Restrictions Weight Bearing Restrictions: No LLE Weight Bearing: Weight bearing as tolerated    Mobility  Bed Mobility               General bed mobility comments: OOb in recliner  Transfers Overall transfer level: Needs assistance Equipment used: Rolling walker (2 wheeled) Transfers: Sit to/from Stand Sit to Stand: Min guard         General transfer comment: close guard for safety. VCS safety, hand placement  Ambulation/Gait Ambulation/Gait assistance: Min guard Ambulation Distance (Feet): 140 Feet Assistive device: Rolling walker (2 wheeled) Gait Pattern/deviations: Decreased stride length;Antalgic;Decreased step length - left     General Gait Details: close guard for safety.    Stairs            Wheelchair Mobility    Modified Rankin (Stroke Patients Only)       Balance                                    Cognition Arousal/Alertness: Awake/[name] Behavior During Therapy: WFL for tasks assessed/performed Overall Cognitive Status: Within Functional Limits for tasks assessed                      Exercises Total Joint Exercises Quad Sets: AROM;Both;10 reps;Seated Heel Slides: AAROM;Left;10 reps;Seated Hip ABduction/ADduction: AAROM;Left;10 reps;Seated Long Arc Quad: AROM;Left;10 reps;Seated General  Exercises - Lower Extremity Heel Raises: AROM;Both;10 reps;Standing    General Comments        Pertinent Vitals/Pain Pain Assessment: 0-10 Pain Score: 7  Pain Location: L hip/thigh area Pain Descriptors / Indicators: Aching;Sore Pain Intervention(s): Monitored during session;Ice applied;Repositioned    Home Living                      Prior Function            PT Goals (current goals can now be found in the care plan section) Progress towards PT goals: Progressing toward goals    Frequency  7X/week    PT Plan Current plan remains appropriate    Co-evaluation             End of Session   Activity Tolerance: Patient tolerated treatment well Patient left: in chair;with call bell/phone within reach;with family/visitor present     Time: [id number] PT Time Calculation (min) (ACUTE ONLY): 14 min  Charges:  $Gait Training: 8-22 mins                    G Codes:      Howard AlertJannie Romona [name], MPT Pager: [phone]

## 2014-08-28 NOTE — Care Management Note (Addendum)
    Page 1 of 1   08/28/2014     10:47:42 AM CARE MANAGEMENT NOTE 08/28/2014  Patient:  Howard [name],Howard R   Account Number:  [id number]  Date Initiated:  08/28/2014  Documentation initiated by:  Lorenda IshiharaPEELE,Emagene Merfeld  Subjective/Objective Assessment:   49 yo male admitted s/p Left total hip replacement through an anterior approach     Action/Plan:   Discharge planning   Anticipated DC Date:  08/28/2014   Anticipated DC Plan:  HOME W HOME HEALTH SERVICES      DC Planning Services  CM consult      [facility]   Choice offered to / List presented to:  C-1 Patient        HH arranged  HH-2 PT      [facility]  [facility]   Status of service:  Completed, signed off Medicare Important Message given?   (If response is "NO", the following Medicare IM given date fields will be blank) Date Medicare IM given:   Medicare IM given by:   Date Additional Medicare IM given:   Additional Medicare IM given by:    Discharge Disposition:  HOME W HOME HEALTH SERVICES  Per UR Regulation:  Reviewed for med. necessity/level of care/duration of stay  If discussed at Long Length of Stay Meetings, dates discussed:    Comments:  08-28-14 Lorenda IshiharaSuzanne Martino Tompson RN CM 1000 Spoke with patient at bedside. [facility]. Has all needed DME. [facility].

## 2014-09-03 NOTE — Discharge Summary (Signed)
Physician Discharge Summary  Patient ID: Howard FowlerJohn R [name] MRN: [id number] DOB/AGE: 1966/02/10 49 y.o.  Admit date: 08/27/2014 Discharge date: 08/28/2014   Procedures:  Procedure(s) (LRB): LEFT TOTAL HIP ARTHROPLASTY ANTERIOR APPROACH (Left)  Attending Physician:  Dr. Durene RomansMatthew Olin   Admission Diagnoses:   Left hip avascular necrosis / pain  Discharge Diagnoses:  Principal Problem:   S/P left THA, AA Active Problems:   Obese  Past Medical History  Diagnosis Date  . Psoriasis   . Osteoarthritis   . Anxiety     HPI:    Howard CandelaJohn R [name], 49 y.o. male, has a history of pain and functional disability in the left hip(s) due to arthritis and avascular necrosis and patient has failed non-surgical conservative treatments for greater than 12 weeks to include NSAID's and/or analgesics, use of assistive devices and activity modification. Onset of symptoms was gradual starting 5 months ago with rapidlly worsening course since that time.The patient noted no past surgery on the left hip, previous right THA per Howard [name] in 2010. Patient currently rates pain in the left hip at 8 out of 10 with activity. Patient has night pain, worsening of pain with activity and weight bearing, trendelenberg gait, pain that interfers with activities of daily living and pain with passive range of motion. Patient has evidence of periarticular osteophytes, joint space narrowing and avascular necrosis by imaging studies. This condition presents safety issues increasing the risk of falls. There is no current active infection.  PCP: Cassell SmilesFUSCO,Howard J., MD   Discharged Condition: good  Hospital Course:  Patient underwent the above stated procedure on 08/27/2014. Patient tolerated the procedure well and brought to the recovery room in good condition and subsequently to the floor.  POD #1 BP: 100/67 ; Pulse: 70 ; Temp: 98.9 F (37.2 C) ; Resp: 18 Patient reports pain as mild, pain controlled. No events throughout the night.  Ready to be discharged home. Dorsiflexion/plantar flexion intact, incision: dressing C/D/I, no cellulitis present and compartment soft.   LABS  Basename    HGB  11.0  HCT  31.8    Discharge Exam: General appearance: alert, cooperative and no distress Extremities: Homans sign is negative, no sign of DVT, no edema, redness or tenderness in the calves or thighs and no ulcers, gangrene or trophic changes  Disposition: Home with follow up in 2 weeks   Follow-up Information    Follow up with Shelda PalLIN,Howard Colver D, MD. Schedule an appointment as soon as possible for a visit in 2 weeks.   Specialty:  Orthopedic Surgery   Contact information:   [address] [place] [id number] [phone]       Discharge Instructions    Call MD / Call 911    Complete by:  As directed   If you experience chest pain or shortness of breath, CALL 911 and be transported to the hospital emergency room.  If you develope a fever above 101 F, pus (white drainage) or increased drainage or redness at the wound, or calf pain, call your surgeon's office.     Change dressing    Complete by:  As directed   Maintain surgical dressing until follow up in the clinic. If the edges start to pull up, may reinforce with tape. If the dressing is no longer working, may remove and cover with gauze and tape, but must keep the area dry and clean.  Call with any questions or concerns.     Constipation Prevention    Complete by:  As directed   Drink plenty of fluids.  Prune juice may be helpful.  You may use a stool softener, such as Colace (over the counter) 100 mg twice a day.  Use MiraLax (over the counter) for constipation as needed.     Diet - low sodium heart healthy    Complete by:  As directed      Discharge instructions    Complete by:  As directed   Maintain surgical dressing until follow up in the clinic. If the edges start to pull up, may reinforce with tape. If the dressing is no longer working, may remove  and cover with gauze and tape, but must keep the area dry and clean.  Follow up in 2 weeks at [facility]. Call with any questions or concerns.     Increase activity slowly as tolerated    Complete by:  As directed      TED hose    Complete by:  As directed   Use stockings (TED hose) for 2 weeks on both leg(s).  You may remove them at night for sleeping.     Weight bearing as tolerated    Complete by:  As directed   Laterality:  left  Extremity:  Lower             Medication List    STOP taking these medications        azithromycin 250 MG tablet  Commonly known as:  ZITHROMAX     HYDROcodone-acetaminophen 10-325 MG per tablet  Commonly known as:  NORCO  Replaced by:  HYDROcodone-acetaminophen 7.5-325 MG per tablet     HYDROcodone-acetaminophen 5-325 MG per tablet  Commonly known as:  NORCO/VICODIN     predniSONE 50 MG tablet  Commonly known as:  DELTASONE      TAKE these medications        ALPRAZolam 0.5 MG tablet  Commonly known as:  XANAX  Take 0.5 mg by mouth 3 (three) times daily as needed for anxiety.     aspirin 325 MG EC tablet  Take 1 tablet (325 mg total) by mouth 2 (two) times daily.     docusate sodium 100 MG capsule  Commonly known as:  COLACE  Take 1 capsule (100 mg total) by mouth 2 (two) times daily.     ferrous sulfate 325 (65 FE) MG tablet  Take 1 tablet (325 mg total) by mouth 3 (three) times daily after meals.     HYDROcodone-acetaminophen 7.5-325 MG per tablet  Commonly known as:  NORCO  Take 1-2 tablets by mouth every 4 (four) hours as needed for moderate pain.  Notes to Patient:  Pain Medication     methocarbamol 500 MG tablet  Commonly known as:  ROBAXIN  Take 1 tablet (500 mg total) by mouth every 6 (six) hours as needed for muscle spasms.  Notes to Patient:  Muscle Relaxer     multivitamin with minerals Tabs tablet  Take 1 tablet by mouth every morning.     polyethylene glycol packet  Commonly known as:  MIRALAX /  GLYCOLAX  Take 17 g by mouth 2 (two) times daily.  Notes to Patient:  Mild Laxatives         Signed: Anastasio Auerbach. Vester Titsworth   PA-C  09/03/2014, 1:44 PM

## 2014-11-19 ENCOUNTER — Ambulatory Visit: Payer: Managed Care, Other (non HMO) | Admitting: Podiatry

## 2014-12-05 ENCOUNTER — Encounter: Payer: Self-pay | Admitting: Podiatry

## 2014-12-05 ENCOUNTER — Ambulatory Visit (INDEPENDENT_AMBULATORY_CARE_PROVIDER_SITE_OTHER): Payer: Managed Care, Other (non HMO) | Admitting: Podiatry

## 2014-12-05 VITALS — BP 133/67 | HR 77 | Resp 15

## 2014-12-05 DIAGNOSIS — M722 Plantar fascial fibromatosis: Secondary | ICD-10-CM | POA: Diagnosis not present

## 2014-12-05 MED ORDER — HYDROCODONE-ACETAMINOPHEN 7.5-325 MG PO TABS: 1.0000 | ORAL_TABLET | ORAL | Status: DC | PRN

## 2014-12-05 NOTE — Progress Notes (Signed)
He presents today after having not seen him for quite some time with a chief complaint of painful nodules on the plantar aspect of the bilateral foot. He states that these plantar fibromas seem to be getting larger since I have not been here to be treated. He states these recently undergone right hip replacement which took in several months to recover from. He states now it is time to take care of my feet. He denies any changes in his past medical history medications allergy surgeries and social history other than what his hip replacement consisted of. He is requesting injections for his plantar fibromas today he is also requesting pain medication.  Objective: Vital signs are stable he is alert and oriented 3. Pulses are strongly palpable. Neurologic sensorium is intact deep tendon reflexes are brisk and equal bilateral. Muscle strength is equal bilateral. Orthopedic evaluation demonstrates pes planus bilateral joints distal to the ankle, full range of motion without crepitation. Cutaneous evaluation does demonstrate large nonpulsatile nodules to the distal medial plantar aspect of the right forefoot along the first metatarsophalangeal joint and just proximal to that area. He also has 2 larger lesions to the medial band of the plantar fascia extending from the first metatarsophalangeal joint to the mid arch on the left foot. These are nonpulsatile they are firm and had previously been diagnosis plantar fibromas. At this point do not appear to be larger than they were the last time I saw him.  Assessment: Plantar fibromatosis bilateral. History of recent right hip replacement.  Plan: We discussed etiology and pathology conservative versus surgical therapies. I reinjected his plantar fibromas with Kenalog and local and aesthetic. He tolerated the procedure well today. A total of 20 mg was injected into each foot. I will follow-up with him in 6 weeks. We will consider another injection at that time. I did write  a prescription for Vicodin No. 30. I made it clear to him that we will not write pain medication at every visit. I did suggest that surgery would probably be his best option in the future.

## 2014-12-16 ENCOUNTER — Ambulatory Visit (INDEPENDENT_AMBULATORY_CARE_PROVIDER_SITE_OTHER): Payer: Managed Care, Other (non HMO) | Admitting: Physician Assistant

## 2014-12-16 ENCOUNTER — Encounter: Payer: Self-pay | Admitting: Physician Assistant

## 2014-12-16 VITALS — BP 119/80 | HR 78 | Temp 98.3°F | Resp 18 | Wt 223.0 lb

## 2014-12-16 DIAGNOSIS — H6691 Otitis media, unspecified, right ear: Secondary | ICD-10-CM

## 2014-12-16 MED ORDER — AMOXICILLIN-POT CLAVULANATE 875-125 MG PO TABS: 1.0000 | ORAL_TABLET | Freq: Two times a day (BID) | ORAL | Status: DC

## 2014-12-16 NOTE — Progress Notes (Signed)
    Patient ID: HURLEY SOBEL MRN: [id number], DOB: [birth year], 49 y.o. Date of Encounter: 12/16/2014, 9:30 AM    Chief Complaint:  Chief Complaint  Patient presents with  . Otalgia    right ear pain since last thursday and was throbbing Saturday     HPI: 49 y.o. year old white male presents with above symptoms.  States that this past Thursday his right ear just was a "nuisance ". Says Friday night he used some "home remedies and put sweet oil and cotton. Says that Saturday it started to throb and continued into yesterday. Says it is continuing to hurt today. Says he has had a little bit of nasal congestion but really has not blown much from his nose. Has had no sore throat. No cough no fevers or chills.     Home Meds:   Outpatient Prescriptions Prior to Visit  Medication Sig Dispense Refill  . ALPRAZolam (XANAX) 0.5 MG tablet Take 0.5 mg by mouth 3 (three) times daily as needed for anxiety.     Marland Kitchen HYDROcodone-acetaminophen (NORCO) 7.5-325 MG per tablet Take 1-2 tablets by mouth every 4 (four) hours as needed for moderate pain. 100 tablet 0  . Multiple Vitamin (MULTIVITAMIN WITH MINERALS) TABS tablet Take 1 tablet by mouth every morning.     No facility-administered medications prior to visit.    Allergies: No Known Allergies    Review of Systems: See HPI for pertinent ROS. All other ROS negative.    Physical Exam: Blood pressure 119/80, pulse 78, temperature 98.3 F (36.8 C), temperature source Oral, resp. rate 18, weight 223 lb (101.152 kg)., Body mass index is 30.24 kg/(m^2). General: WNWD WM.  Appears in no acute distress. HEENT: Normocephalic, atraumatic, eyes without discharge, sclera non-icteric, nares are without discharge. Bilateral auditory canals clear. Left  TM is without perforation, pearly grey and translucent with reflective cone of light. Right TM : Down the middle of the TM is area that is bright red erythema.  Behind this -- TM is very retracted, dull.   Oral cavity moist, posterior pharynx without exudate, erythema, peritonsillar abscess. Neck: Supple. No thyromegaly. No lymphadenopathy. Lungs: Clear bilaterally to auscultation without wheezes, rales, or rhonchi. Breathing is unlabored. Heart: Regular rhythm. No murmurs, rubs, or gallops. Msk:  Strength and tone normal for age. Extremities/Skin: Warm and dry. Neuro: Alert and oriented X 3. Moves all extremities spontaneously. Gait is normal. CNII-XII grossly in tact. Psych:  Responds to questions appropriately with a normal affect.     ASSESSMENT AND PLAN:  49 y.o. year old male with  1. Acute right otitis media, recurrence not specified, unspecified otitis media type Take anti-biotic as directed and complete all of it. Can use over-the-counter Tylenol and Motrin to control pain. Follow-up if symptoms do not resolve upon completion of anti-biotic. - amoxicillin-clavulanate (AUGMENTIN) 875-125 MG per tablet; Take 1 tablet by mouth 2 (two) times daily.  Dispense: 20 tablet; Refill: 0   Signed, [address] [place], [place], [facility] 12/16/2014 9:30 AM

## 2015-01-16 ENCOUNTER — Encounter: Payer: Self-pay | Admitting: Podiatry

## 2015-01-16 ENCOUNTER — Ambulatory Visit (INDEPENDENT_AMBULATORY_CARE_PROVIDER_SITE_OTHER): Payer: Managed Care, Other (non HMO) | Admitting: Podiatry

## 2015-01-16 VITALS — BP 145/70 | HR 84 | Resp 12

## 2015-01-16 DIAGNOSIS — M722 Plantar fascial fibromatosis: Secondary | ICD-10-CM | POA: Diagnosis not present

## 2015-01-18 NOTE — Progress Notes (Signed)
He presents today for follow-up of his plantar fibromas to the plantar aspect of the bilateral foot. He states that they seem to be getting smaller and he would like to continue with injection therapy if possible.  Objective: Vital signs are stable he is alert and oriented 3. Pulses are strongly palpable. Neurologic sensorium is intact per Semmes-Weinstein monofilament. Deep tendon reflexes are intact bilateral and muscle strength +5 over 5 dorsiflexion and plantar flexors and inverters everters all intrinsic musculature is intact. Orthopedic evaluation demonstrates all joints distal to the ankle level fall range of motion without crepitation. Cutaneous evaluation does demonstrate plantar fibromas to the medial band of the plantar fascia bilaterally. There appears to be 3 or more from distal to proximal largest a smallest along the medial band of the plantar fascia left and 2 smaller lesions to the distal aspect of the plantar fascia on the right foot. These appear to be significantly smaller and less firm than last visit.  Assessment: Plantar fibromatosis bilateral.  Plan: Discussed etiology and pathology conservative versus surgical therapies. Encouraged another set of injections bilateral. We performed this today with Kenalog and local anesthesia bilateral foot. Follow up with him in 6-8 weeks for reevaluation.

## 2015-02-27 ENCOUNTER — Encounter: Payer: Self-pay | Admitting: Podiatry

## 2015-02-27 ENCOUNTER — Ambulatory Visit (INDEPENDENT_AMBULATORY_CARE_PROVIDER_SITE_OTHER): Payer: Managed Care, Other (non HMO) | Admitting: Podiatry

## 2015-02-27 VITALS — BP 142/76 | HR 87 | Resp 12

## 2015-02-27 DIAGNOSIS — M722 Plantar fascial fibromatosis: Secondary | ICD-10-CM

## 2015-03-02 NOTE — Progress Notes (Signed)
He presents today for follow-up of his plantar fibromatosis bilaterally. He states they seem to be doing much better. They are getting smaller and less painful however he thinks the left foot is moving along much faster than the right foot.  Objective: Vital signs are stable he is alert and oriented 3. Pulses are strongly palpable. Plantar fibromas are still present however they have reduced in size considerably and I agree that the left foot appears to be much faster than the right foot. No open lesions no thinning of skin no signs of infection.  Assessment: Plantar fibromatosis bilateral.  Plan: I reinjected each lesion today with Kenalog and local anesthetic making sure is to stay deep and not to get the steroid too close to the skin. I will follow-up with him in 2 months.  Arbutus Pedodd Hyatt DPM

## 2015-03-27 ENCOUNTER — Encounter: Payer: Self-pay | Admitting: Family Medicine

## 2015-03-27 ENCOUNTER — Ambulatory Visit (INDEPENDENT_AMBULATORY_CARE_PROVIDER_SITE_OTHER): Payer: Managed Care, Other (non HMO) | Admitting: Family Medicine

## 2015-03-27 ENCOUNTER — Other Ambulatory Visit: Payer: Self-pay | Admitting: Family Medicine

## 2015-03-27 VITALS — BP 108/92 | HR 68 | Temp 98.8°F | Resp 18 | Ht 72.0 in | Wt 215.0 lb

## 2015-03-27 DIAGNOSIS — Z13220 Encounter for screening for lipoid disorders: Secondary | ICD-10-CM

## 2015-03-27 DIAGNOSIS — J019 Acute sinusitis, unspecified: Secondary | ICD-10-CM | POA: Diagnosis not present

## 2015-03-27 LAB — LIPID PANEL
CHOL/HDL RATIO: 3 ratio (ref <=5.0)
Cholesterol: 186 mg/dL (ref 125–200)
HDL: 61 mg/dL (ref >=40)
LDL CALC: 107 mg/dL (ref <130)
Triglycerides: 92 mg/dL (ref <150)
VLDL: 18 mg/dL (ref <30)

## 2015-03-27 LAB — COMPLETE METABOLIC PANEL WITH GFR
ALT: 59 U/L — ABNORMAL HIGH (ref 9–46)
AST: 51 U/L — ABNORMAL HIGH (ref 10–40)
Albumin: 4.4 g/dL (ref 3.6–5.1)
Alkaline Phosphatase: 71 U/L (ref 40–115)
BUN: 17 mg/dL (ref 7–25)
CO2: 24 mmol/L (ref 20–31)
Calcium: 9.2 mg/dL (ref 8.6–10.3)
Chloride: 103 mmol/L (ref 98–110)
Creat: 0.58 mg/dL — ABNORMAL LOW (ref 0.60–1.35)
GFR, Est African American: >89 mL/min (ref >=60)
GLUCOSE: 115 mg/dL — AB (ref 70–99)
POTASSIUM: 4.3 mmol/L (ref 3.5–5.3)
SODIUM: 137 mmol/L (ref 135–146)
Total Bilirubin: 1.4 mg/dL — ABNORMAL HIGH (ref 0.2–1.2)
Total Protein: 6.2 g/dL (ref 6.1–8.1)

## 2015-03-27 LAB — CBC WITH DIFFERENTIAL/PLATELET
BASOS PCT: 1 % (ref 0–1)
Basophils Absolute: 0.1 10*3/uL (ref 0.0–0.1)
EOS ABS: 0.1 10*3/uL (ref 0.0–0.7)
Eosinophils Relative: 1 % (ref 0–5)
HCT: 44.2 % (ref 39.0–52.0)
Hemoglobin: 14.9 g/dL (ref 13.0–17.0)
LYMPHS ABS: 2.7 10*3/uL (ref 0.7–4.0)
Lymphocytes Relative: 40 % (ref 12–46)
MCH: 35.1 pg — AB (ref 26.0–34.0)
MCHC: 33.7 g/dL (ref 30.0–36.0)
MCV: 104 fL — ABNORMAL HIGH (ref 78.0–100.0)
MONO ABS: 0.5 10*3/uL (ref 0.1–1.0)
MONOS PCT: 8 % (ref 3–12)
MPV: 11.1 fL (ref 8.6–12.4)
NEUTROS ABS: 3.4 10*3/uL (ref 1.7–7.7)
NEUTROS PCT: 50 % (ref 43–77)
PLATELETS: 204 10*3/uL (ref 150–400)
RBC: 4.25 MIL/uL (ref 4.22–5.81)
RDW: 14.2 % (ref 11.5–15.5)
WBC: 6.7 10*3/uL (ref 4.0–10.5)

## 2015-03-27 MED ORDER — AMOXICILLIN 875 MG PO TABS: 875.0000 mg | ORAL_TABLET | Freq: Two times a day (BID) | ORAL | Status: DC

## 2015-03-27 NOTE — Progress Notes (Signed)
   Subjective:    Patient ID: Howard [name], male    DOB: 11/12/1965, 49 y.o.   MRN: [id number]  HPI The patient reports a three-day history of pain in his left maxillary sinus, postnasal drip, rhinorrhea, pain in his teeth, and pain and pressure in his left ear. Past Medical History  Diagnosis Date  . Psoriasis   . Osteoarthritis   . Anxiety    Past Surgical History  Procedure Laterality Date  . Total hip arthroplasty    . Appendectomy    . Total hip arthroplasty Left 08/27/2014    Procedure: LEFT TOTAL HIP ARTHROPLASTY ANTERIOR APPROACH;  Surgeon: Durene RomansMatthew Olin, MD;  Location: [facility];  Service: Orthopedics;  Laterality: Left;   Current Outpatient Prescriptions on File Prior to Visit  Medication Sig Dispense Refill  . ALPRAZolam (XANAX) 0.5 MG tablet Take 0.5 mg by mouth 3 (three) times daily as needed for anxiety.     . Multiple Vitamin (MULTIVITAMIN WITH MINERALS) TABS tablet Take 1 tablet by mouth every morning.    . traMADol (ULTRAM) 50 MG tablet      No current facility-administered medications on file prior to visit.   No Known Allergies Social History   Social History  . Marital Status: Married    Spouse Name: N/A  . Number of Children: N/A  . Years of Education: N/A   Occupational History  . Not on file.   Social History Main Topics  . Smoking status: Current Every Day Smoker -- 0.50 packs/day for 30 years    Types: Cigarettes  . Smokeless tobacco: Never Used  . Alcohol Use: 8.4 oz/week    14 Cans of beer per week  . Drug Use: No  . Sexual Activity: Not on file   Other Topics Concern  . Not on file   Social History Narrative      Review of Systems  All other systems reviewed and are negative.      Objective:   Physical Exam  Constitutional: He appears well-developed and well-nourished. No distress.  HENT:  Right Ear: Tympanic membrane and ear canal normal.  Left Ear: Tympanic membrane and ear canal normal.  Nose: Mucosal edema and rhinorrhea  present. Left sinus exhibits maxillary sinus tenderness.  Cardiovascular: Normal rate, regular rhythm and normal heart sounds.   No murmur heard. Pulmonary/Chest: Effort normal and breath sounds normal. No respiratory distress. He has no wheezes. He has no rales.  Abdominal: Soft. Bowel sounds are normal. He exhibits no distension. There is no tenderness. There is no rebound and no guarding.  Skin: He is not diaphoretic.  Vitals reviewed.         Assessment & Plan:  Acute rhinosinusitis - Plan: amoxicillin (AMOXIL) 875 MG tablet  Screening cholesterol level - Plan: CBC with Differential/Platelet, COMPLETE METABOLIC PANEL WITH GFR, Lipid panel  He has a sinus infection. I recommended tincture of time and no antibiotics for 7-10 days. I recommended nasal saline 4 times a day. I recommended Flonase 2 sprays each nostril daily. If symptoms are no better after 10 days, I want him to take amoxicillin 875 mg by mouth twice a day

## 2015-03-29 LAB — HEPATITIS PANEL, ACUTE
HCV AB: NEGATIVE
HEP B C IGM: NONREACTIVE
Hep A IgM: NONREACTIVE
Hepatitis B Surface Ag: NEGATIVE

## 2015-04-03 ENCOUNTER — Other Ambulatory Visit: Payer: Self-pay | Admitting: *Deleted

## 2015-04-03 DIAGNOSIS — R945 Abnormal results of liver function studies: Secondary | ICD-10-CM

## 2015-04-03 DIAGNOSIS — R7989 Other specified abnormal findings of blood chemistry: Secondary | ICD-10-CM

## 2015-04-03 DIAGNOSIS — R7301 Impaired fasting glucose: Secondary | ICD-10-CM

## 2015-04-24 ENCOUNTER — Ambulatory Visit: Payer: Managed Care, Other (non HMO) | Admitting: Podiatry

## 2015-04-28 ENCOUNTER — Other Ambulatory Visit: Payer: Self-pay

## 2015-05-06 ENCOUNTER — Ambulatory Visit: Payer: Managed Care, Other (non HMO) | Admitting: Podiatry

## 2015-05-27 ENCOUNTER — Ambulatory Visit: Payer: Managed Care, Other (non HMO) | Admitting: Podiatry

## 2015-06-04 ENCOUNTER — Emergency Department (HOSPITAL_COMMUNITY): Payer: Managed Care, Other (non HMO)

## 2015-06-04 ENCOUNTER — Emergency Department (HOSPITAL_COMMUNITY)
Admission: EM | Admit: 2015-06-04 | Discharge: 2015-06-04 | Disposition: A | Discharge status: Home / Self Care | Payer: Managed Care, Other (non HMO) | Attending: Emergency Medicine | Admitting: Emergency Medicine

## 2015-06-04 DIAGNOSIS — S81851A Open bite, right lower leg, initial encounter: Secondary | ICD-10-CM | POA: Diagnosis present

## 2015-06-04 DIAGNOSIS — S81811A Laceration without foreign body, right lower leg, initial encounter: Secondary | ICD-10-CM | POA: Diagnosis not present

## 2015-06-04 DIAGNOSIS — W540XXA Bitten by dog, initial encounter: Secondary | ICD-10-CM | POA: Insufficient documentation

## 2015-06-04 DIAGNOSIS — Y9389 Activity, other specified: Secondary | ICD-10-CM | POA: Insufficient documentation

## 2015-06-04 DIAGNOSIS — F419 Anxiety disorder, unspecified: Secondary | ICD-10-CM | POA: Diagnosis not present

## 2015-06-04 DIAGNOSIS — Z8739 Personal history of other diseases of the musculoskeletal system and connective tissue: Secondary | ICD-10-CM | POA: Insufficient documentation

## 2015-06-04 DIAGNOSIS — Z23 Encounter for immunization: Secondary | ICD-10-CM | POA: Diagnosis not present

## 2015-06-04 DIAGNOSIS — Z79899 Other long term (current) drug therapy: Secondary | ICD-10-CM | POA: Diagnosis not present

## 2015-06-04 DIAGNOSIS — Z872 Personal history of diseases of the skin and subcutaneous tissue: Secondary | ICD-10-CM | POA: Diagnosis not present

## 2015-06-04 DIAGNOSIS — Z792 Long term (current) use of antibiotics: Secondary | ICD-10-CM | POA: Insufficient documentation

## 2015-06-04 DIAGNOSIS — Y92009 Unspecified place in unspecified non-institutional (private) residence as the place of occurrence of the external cause: Secondary | ICD-10-CM | POA: Diagnosis not present

## 2015-06-04 DIAGNOSIS — IMO0002 Reserved for concepts with insufficient information to code with codable children: Secondary | ICD-10-CM

## 2015-06-04 DIAGNOSIS — F17210 Nicotine dependence, cigarettes, uncomplicated: Secondary | ICD-10-CM | POA: Insufficient documentation

## 2015-06-04 DIAGNOSIS — Y998 Other external cause status: Secondary | ICD-10-CM | POA: Insufficient documentation

## 2015-06-04 MED ORDER — TETANUS-DIPHTH-ACELL PERTUSSIS 5-2.5-18.5 LF-MCG/0.5 IM SUSP
0.5000 mL | Freq: Once | INTRAMUSCULAR | Status: AC
  Administered 2015-06-04: 0.5 mL via INTRAMUSCULAR
  Filled 2015-06-04: qty 0.5

## 2015-06-04 MED ORDER — AMOXICILLIN-POT CLAVULANATE 875-125 MG PO TABS: 1.0000 | ORAL_TABLET | Freq: Two times a day (BID) | ORAL | Status: DC

## 2015-06-04 MED ORDER — HYDROCODONE-ACETAMINOPHEN 5-325 MG PO TABS
1.0000 | ORAL_TABLET | Freq: Once | ORAL | Status: AC
  Administered 2015-06-04: 1 via ORAL
  Filled 2015-06-04: qty 1

## 2015-06-04 MED ORDER — HYDROCODONE-ACETAMINOPHEN 5-325 MG PO TABS: 1.0000 | ORAL_TABLET | Freq: Four times a day (QID) | ORAL | Status: DC | PRN

## 2015-06-04 MED ORDER — NAPROXEN 500 MG PO TABS: 500.0000 mg | ORAL_TABLET | Freq: Two times a day (BID) | ORAL | Status: DC | PRN

## 2015-06-04 NOTE — ED Provider Notes (Signed)
CSN: [id number]     Arrival date & time 06/04/15  1704 History   By signing my name below, I, Evon Slack, attest that this documentation has been prepared under the direction and in the presence of Hansika Leaming Camprubi-Soms, PA-C. Electronically Signed: Evon Slack, ED Scribe. 06/04/2015. 6:23 PM.     Chief Complaint  Patient presents with  . Animal Bite   Patient is a 50 y.o. male presenting with animal bite. The history is provided by the patient. No language interpreter was used.  Animal Bite Contact animal:  Dog Location:  Leg Leg injury location:  R lower leg Time since incident:  1 hour Pain details:    Quality: throbbing.   Severity:  Moderate   Timing:  Intermittent   Progression:  Unchanged Incident location:  Another residence Provoked: provoked   Notifications:  None Animal's rabies vaccination status:  Up to date Animal in possession: yes   Tetanus status:  Unknown Relieved by:  None tried Worsened by:  Activity Ineffective treatments:  None tried Associated symptoms: no numbness and no swelling    HPI Comments: Murlin Schrieber Rodd is a 50 y.o. male who presents to the Emergency Department complaining of somewhat provoked dog bite to the right lower leg onset 1 hour PTA. Pt states he was knocking on his neighbor's door and the dog "was doing what he was supposed to do" by protecting his territory, per the patient's report. Pt states he has intermittent 7/10 throbbing nonradiating pain at the site of the bite, worse with ambulating, and with no tx tried PTA. Associated symptoms include puncture wounds and laceration to the right calf from the dog bite. He states that the bleeding is controlled with pressure. Pt states that the dogs vaccinations are UTD and is his friends' dog, unsure if animal control was contacted yet but stated that "it probably is happening now". Dog is in possession of the owner at this time. Denies anticoagulant/blood thinner use. Denies CP, SOB,  Abdominal pain, n/v, numbness, tingling, or weakness. Pt states that he believes his last tetanus was 1 year ago, but isn't sure, and says "whatever is in the system is up to date". Chart review reveals no TDap on file in our system.  Past Medical History  Diagnosis Date  . Psoriasis   . Osteoarthritis   . Anxiety    Past Surgical History  Procedure Laterality Date  . Total hip arthroplasty    . Appendectomy    . Total hip arthroplasty Left 08/27/2014    Procedure: LEFT TOTAL HIP ARTHROPLASTY ANTERIOR APPROACH;  Surgeon: Durene Romans, MD;  Location: [facility];  Service: Orthopedics;  Laterality: Left;   No family history on file. Social History  Substance Use Topics  . Smoking status: Current Every Day Smoker -- 0.50 packs/day for 30 years    Types: Cigarettes  . Smokeless tobacco: Never Used  . Alcohol Use: 8.4 oz/week    14 Cans of beer per week    Review of Systems  Respiratory: Negative for shortness of breath.   Cardiovascular: Negative for chest pain.  Gastrointestinal: Negative for nausea, vomiting and abdominal pain.  Musculoskeletal: Positive for myalgias (R calf). Negative for joint swelling and arthralgias.  Skin: Positive for wound (bite wound R calf).  Allergic/Immunologic: Negative for immunocompromised state.  Neurological: Negative for weakness and numbness.  Hematological: Does not bruise/bleed easily (no blood thinners).  Psychiatric/Behavioral: Negative for confusion.   10 Systems reviewed and all are negative for acute change except  as noted in the HPI.   Allergies  Review of patient's allergies indicates no known allergies.  Home Medications   Prior to Admission medications   Medication Sig Start Date End Date Taking? Authorizing Provider  ALPRAZolam Prudy Feeler) 0.5 MG tablet Take 0.5 mg by mouth 3 (three) times daily as needed for anxiety.     Historical Provider, MD  amoxicillin (AMOXIL) 875 MG tablet Take 1 tablet (875 mg total) by mouth 2 (two) times  daily. 03/27/15   Donita Brooks, MD  Multiple Vitamin (MULTIVITAMIN WITH MINERALS) TABS tablet Take 1 tablet by mouth every morning.    Historical Provider, MD  traMADol (ULTRAM) 50 MG tablet  12/26/14   Historical Provider, MD   BP 118/85 mmHg  Pulse 90  Temp(Src) 97.7 F (36.5 C) (Oral)  Resp 18  Ht  (1.854 m)  Wt 212 lb (96.163 kg)  BMI 27.98 kg/m2  SpO2 99%   Physical Exam  Constitutional: He is oriented to person, place, and time. Vital signs are normal. He appears well-developed and well-nourished.  Non-toxic appearance. No distress.  Afebrile, nontoxic, NAD  HENT:  Head: Normocephalic and atraumatic.  Mouth/Throat: Mucous membranes are normal.  Eyes: Conjunctivae and EOM are normal. Right eye exhibits no discharge. Left eye exhibits no discharge.  Neck: Normal range of motion. Neck supple.  Cardiovascular: Normal rate and intact distal pulses.   Pulmonary/Chest: Effort normal. No respiratory distress.  Abdominal: Normal appearance. He exhibits no distension.  Musculoskeletal: Normal range of motion.       Right lower leg: He exhibits tenderness and laceration. He exhibits no bony tenderness, no swelling and no deformity.  Right calf with bite mark to mid calf with several puncture marks and approximately 3 cm avulsion skin injury, bleeding controlled, depth extending into subcutaneous fat but does not appear to extend into the muscle belly. Achilles intact. FROM intact in right ankle and knee. Minimal tenderness surrounding the bite mark but no other focal bony TTP. Strength and sensation grossly intact. Distal pulses intact. SEE PICTURE BELOW.   Neurological: He is alert and oriented to person, place, and time. He has normal strength. No sensory deficit.  Skin: Skin is warm and dry. Laceration noted. No rash noted.  Right cal lacerta ion as noted above and picture below.   Psychiatric: He has a normal mood and affect.  Nursing note and vitals reviewed.      ED  Course  Irrigation Date/Time: 06/04/2015 7:07 PM Performed by: Allen Derry Authorized by: Allen Derry Consent: Verbal consent obtained. Risks and benefits: risks, benefits and alternatives were discussed Consent given by: patient Patient understanding: patient states understanding of the procedure being performed Patient consent: the patient's understanding of the procedure matches consent given Patient identity confirmed: verbally with patient Local anesthesia used: no Patient sedated: no Patient tolerance: Patient tolerated the procedure well with no immediate complications Comments: Irrigated wound with copious amounts of normal saline  .Marland KitchenLaceration Repair Date/Time: 06/04/2015 7:07 PM Performed by: Allen Derry Authorized by: Allen Derry Consent: Verbal consent obtained. Risks and benefits: risks, benefits and alternatives were discussed Consent given by: patient Patient understanding: patient states understanding of the procedure being performed Patient consent: the patient's understanding of the procedure matches consent given Patient identity confirmed: verbally with patient Body area: lower extremity Location details: right lower leg Laceration length: 3 cm Foreign bodies: no foreign bodies Tendon involvement: none Nerve involvement: none Vascular damage: no Patient sedated: no Irrigation solution: saline Irrigation method: syringe  Amount of cleaning: extensive Debridement: none Degree of undermining: none Skin closure: Steri-Strips Approximation: loose Approximation difficulty: simple Dressing: antibiotic ointment and gauze roll Patient tolerance: Patient tolerated the procedure well with no immediate complications   (including critical care time) DIAGNOSTIC STUDIES: Oxygen Saturation is 99% on RA, normal by my interpretation.    COORDINATION OF CARE: 5:52 PM-Discussed treatment plan with pt at bedside and pt  agreed to plan.     Labs Review Labs Reviewed - No data to display  Imaging Review Dg Tibia/fibula Right  06/04/2015  CLINICAL DATA:  Dog bite to right lower leg.  Initial encounter. EXAM: RIGHT TIBIA AND FIBULA - 2 VIEW COMPARISON:  None. FINDINGS: No evidence of fracture or soft tissue foreign body. No bony lesions are identified. IMPRESSION: No acute findings. Electronically Signed   By: Irish Lack M.D.   On: 06/04/2015 18:44      EKG Interpretation None      MDM   Final diagnoses:  Dog bite  Laceration     49 y.o. male here with dog bite to R calf, extends towards the subQ fat but doesn't appear to penetrate into the muscle belly. NVI with soft compartments, achilles intact. Minimal tenderness around bite mark but no focal bony TTP. Will obtain xray to r/o retained FB, then irrigate wound well but doubt we will need to repair this as it's not a gaping wound and closure would likely result in increased infection risk, since even a loose closure would bring the margins together too closely. Will give pain meds and update tetanus since pt is not aware of last TDap and there is not one in the system. Will reassess shortly  7:09 PM Xray neg. Wound irrigated with copious amount of NS with no FBs noted during irrigation, flap of skin was steri-stripped down for closer approximation of wound edges, but still doubt that tacking the wound together would be beneficial. Discussed wound care. F/up with PCP in 2 days for recheck of wound and ongoing management. Rx for pain meds and augmentin given. I explained the diagnosis and have given explicit precautions to return to the ER including for any other new or worsening symptoms. The patient understands and accepts the medical plan as it's been dictated and I have answered their questions. Discharge instructions concerning home care and prescriptions have been given. The patient is STABLE and is discharged to home in good condition.  I  personally performed the services described in this documentation, which was scribed in my presence. The recorded information has been reviewed and is accurate.    BP 118/85 mmHg  Pulse 90  Temp(Src) 97.7 F (36.5 C) (Oral)  Resp 18  Ht 6\' 1"  (1.854 m)  Wt 96.163 kg  BMI 27.98 kg/m2  SpO2 99%  Meds ordered this encounter  Medications  . HYDROcodone-acetaminophen (NORCO/VICODIN) 5-325 MG per tablet 1 tablet    Sig:   . Tdap (BOOSTRIX) injection 0.5 mL    Sig:   . amoxicillin-clavulanate (AUGMENTIN) 875-125 MG tablet    Sig: Take 1 tablet by mouth 2 (two) times daily. One po bid x 7 days    Dispense:  14 tablet    Refill:  0    Order Specific Question:  Supervising Provider    Answer:  Hyacinth Meeker, BRIAN [[id number]]  . HYDROcodone-acetaminophen (NORCO) 5-325 MG tablet    Sig: Take 1 tablet by mouth every 6 (six) hours as needed for severe pain.    Dispense:  15 tablet  Refill:  0    Order Specific Question:  Supervising Provider    Answer:  Hyacinth Meeker, BRIAN [[id number]]  . naproxen (NAPROSYN) 500 MG tablet    Sig: Take 1 tablet (500 mg total) by mouth 2 (two) times daily as needed for mild pain, moderate pain or headache (TAKE WITH MEALS.).    Dispense:  20 tablet    Refill:  0    Order Specific Question:  Supervising Provider    Answer:  Eber Hong [[id number]]     Addalyn Speedy Camprubi-Soms, PA-C 06/04/15 1910  Doug Sou, MD 06/04/15 [id number]

## 2015-06-04 NOTE — ED Notes (Signed)
Patient transported to X-ray 

## 2015-06-04 NOTE — Discharge Instructions (Signed)
Keep wound clean with mild soap and water twice daily. Keep area covered with a topical antibiotic ointment and bandage, keep bandage dry, and do not submerge in water/pools. Make sure you always try to push the flap of skin down when you're applying the dressing, and make sure you're using the antibiotic OINTMENT (the vaseline-based ones). You can use the steri-strips to tack the skin flap down to make it easier. Ice and elevate for additional pain relief and swelling. Alternate between Naprosyn and norco as directed as needed for additional pain relief but don't drive while taking norco. Follow up with your primary care doctor or the [facility] in approximately 2 days for wound recheck and ongoing management of your wound. Monitor area for signs of infection to include, but not limited to: increasing pain, spreading redness, drainage/pus, worsening swelling, or fevers. Return to emergency department for emergent changing or worsening symptoms.

## 2015-06-04 NOTE — ED Notes (Signed)
Pt st's he was at his neighbors house knocking on the door and his neighbor's dog bit him on the leg.  Pt has dog bite to right lower leg.  Bleeding controlled at this time.  St's dog is up to date on shots

## 2015-06-05 ENCOUNTER — Encounter (INDEPENDENT_AMBULATORY_CARE_PROVIDER_SITE_OTHER): Payer: Managed Care, Other (non HMO) | Admitting: Podiatry

## 2015-06-05 NOTE — Progress Notes (Signed)
This encounter was created in error - please disregard.

## 2015-07-06 ENCOUNTER — Emergency Department (HOSPITAL_COMMUNITY)
Admission: EM | Admit: 2015-07-06 | Discharge: 2015-07-07 | Disposition: A | Discharge status: Other Acute Inpatient Hospital | Payer: Managed Care, Other (non HMO) | Attending: Emergency Medicine | Admitting: Emergency Medicine

## 2015-07-06 ENCOUNTER — Encounter (HOSPITAL_COMMUNITY): Payer: Self-pay | Admitting: *Deleted

## 2015-07-06 DIAGNOSIS — F329 Major depressive disorder, single episode, unspecified: Secondary | ICD-10-CM | POA: Diagnosis not present

## 2015-07-06 DIAGNOSIS — Z872 Personal history of diseases of the skin and subcutaneous tissue: Secondary | ICD-10-CM | POA: Insufficient documentation

## 2015-07-06 DIAGNOSIS — M1990 Unspecified osteoarthritis, unspecified site: Secondary | ICD-10-CM | POA: Diagnosis not present

## 2015-07-06 DIAGNOSIS — F1310 Sedative, hypnotic or anxiolytic abuse, uncomplicated: Secondary | ICD-10-CM | POA: Diagnosis not present

## 2015-07-06 DIAGNOSIS — F1110 Opioid abuse, uncomplicated: Secondary | ICD-10-CM | POA: Insufficient documentation

## 2015-07-06 DIAGNOSIS — F32A Depression, unspecified: Secondary | ICD-10-CM

## 2015-07-06 DIAGNOSIS — F419 Anxiety disorder, unspecified: Secondary | ICD-10-CM | POA: Insufficient documentation

## 2015-07-06 DIAGNOSIS — G478 Other sleep disorders: Secondary | ICD-10-CM | POA: Diagnosis not present

## 2015-07-06 DIAGNOSIS — F17210 Nicotine dependence, cigarettes, uncomplicated: Secondary | ICD-10-CM | POA: Diagnosis not present

## 2015-07-06 DIAGNOSIS — Z79899 Other long term (current) drug therapy: Secondary | ICD-10-CM | POA: Insufficient documentation

## 2015-07-06 DIAGNOSIS — G8929 Other chronic pain: Secondary | ICD-10-CM | POA: Insufficient documentation

## 2015-07-06 DIAGNOSIS — R45851 Suicidal ideations: Secondary | ICD-10-CM | POA: Diagnosis present

## 2015-07-06 HISTORY — DX: Dorsalgia, unspecified: M54.9

## 2015-07-06 HISTORY — DX: Other chronic pain: G89.29

## 2015-07-06 HISTORY — DX: Alcohol abuse, uncomplicated: F10.10

## 2015-07-06 LAB — RAPID URINE DRUG SCREEN, HOSP PERFORMED
Amphetamines: NOT DETECTED
Barbiturates: NOT DETECTED
Benzodiazepines: POSITIVE — AB
Cocaine: NOT DETECTED
OPIATES: POSITIVE — AB
TETRAHYDROCANNABINOL: NOT DETECTED

## 2015-07-06 LAB — COMPREHENSIVE METABOLIC PANEL
ALK PHOS: 88 U/L (ref 38–126)
ALT: 53 U/L (ref 17–63)
AST: 46 U/L — ABNORMAL HIGH (ref 15–41)
Albumin: 4 g/dL (ref 3.5–5.0)
Anion gap: 10 (ref 5–15)
BUN: 14 mg/dL (ref 6–20)
CALCIUM: 8.8 mg/dL — AB (ref 8.9–10.3)
CO2: 26 mmol/L (ref 22–32)
CREATININE: 0.67 mg/dL (ref 0.61–1.24)
Chloride: 103 mmol/L (ref 101–111)
Glucose, Bld: 94 mg/dL (ref 65–99)
Potassium: 4.2 mmol/L (ref 3.5–5.1)
Sodium: 139 mmol/L (ref 135–145)
Total Bilirubin: 1 mg/dL (ref 0.3–1.2)
Total Protein: 6.2 g/dL — ABNORMAL LOW (ref 6.5–8.1)

## 2015-07-06 LAB — CBC
HCT: 43.3 % (ref 39.0–52.0)
HEMOGLOBIN: 14.7 g/dL (ref 13.0–17.0)
MCH: 33.1 pg (ref 26.0–34.0)
MCHC: 33.9 g/dL (ref 30.0–36.0)
MCV: 97.5 fL (ref 78.0–100.0)
Platelets: 251 10*3/uL (ref 150–400)
RBC: 4.44 MIL/uL (ref 4.22–5.81)
RDW: 12.3 % (ref 11.5–15.5)
WBC: 7.6 10*3/uL (ref 4.0–10.5)

## 2015-07-06 LAB — URINALYSIS, ROUTINE W REFLEX MICROSCOPIC
BILIRUBIN URINE: NEGATIVE
Glucose, UA: NEGATIVE mg/dL
Hgb urine dipstick: NEGATIVE
KETONES UR: NEGATIVE mg/dL
LEUKOCYTES UA: NEGATIVE
NITRITE: NEGATIVE
PH: 5 (ref 5.0–8.0)
PROTEIN: NEGATIVE mg/dL
Specific Gravity, Urine: 1.014 (ref 1.005–1.030)

## 2015-07-06 LAB — ACETAMINOPHEN LEVEL: Acetaminophen (Tylenol), Serum: <10 ug/mL — ABNORMAL LOW (ref 10–30)

## 2015-07-06 LAB — SALICYLATE LEVEL

## 2015-07-06 LAB — ETHANOL: ALCOHOL ETHYL (B): 191 mg/dL — AB (ref <5)

## 2015-07-06 MED ORDER — ACETAMINOPHEN 325 MG PO TABS: 650.0000 mg | ORAL_TABLET | ORAL | Status: DC | PRN

## 2015-07-06 MED ORDER — ESCITALOPRAM OXALATE 10 MG PO TABS: 20.0000 mg | ORAL_TABLET | Freq: Every day | ORAL | Status: DC

## 2015-07-06 MED ORDER — ALPRAZOLAM 0.25 MG PO TABS: 0.2500 mg | ORAL_TABLET | Freq: Three times a day (TID) | ORAL | Status: DC | PRN

## 2015-07-06 MED ORDER — VITAMIN B-1 100 MG PO TABS: 100.0000 mg | ORAL_TABLET | Freq: Every day | ORAL | Status: DC

## 2015-07-06 MED ORDER — ADULT MULTIVITAMIN W/MINERALS CH: 1.0000 | ORAL_TABLET | Freq: Every morning | ORAL | Status: DC

## 2015-07-06 MED ORDER — IBUPROFEN 400 MG PO TABS: 600.0000 mg | ORAL_TABLET | Freq: Three times a day (TID) | ORAL | Status: DC | PRN

## 2015-07-06 MED ORDER — ALUM & MAG HYDROXIDE-SIMETH 200-200-20 MG/5ML PO SUSP: 30.0000 mL | ORAL | Status: DC | PRN

## 2015-07-06 MED ORDER — LORAZEPAM 1 MG PO TABS: 0.0000 mg | ORAL_TABLET | Freq: Four times a day (QID) | ORAL | Status: DC

## 2015-07-06 MED ORDER — HYDROCODONE-ACETAMINOPHEN 10-325 MG PO TABS: 1.0000 | ORAL_TABLET | Freq: Three times a day (TID) | ORAL | Status: DC | PRN

## 2015-07-06 MED ORDER — LORAZEPAM 1 MG PO TABS: 0.0000 mg | ORAL_TABLET | Freq: Two times a day (BID) | ORAL | Status: DC

## 2015-07-06 MED ORDER — NAPROXEN 250 MG PO TABS: 500.0000 mg | ORAL_TABLET | Freq: Two times a day (BID) | ORAL | Status: DC | PRN

## 2015-07-06 MED ORDER — TRAMADOL HCL 50 MG PO TABS: 100.0000 mg | ORAL_TABLET | ORAL | Status: DC | PRN

## 2015-07-06 MED ORDER — THIAMINE HCL 100 MG/ML IJ SOLN: 100.0000 mg | Freq: Every day | INTRAMUSCULAR | Status: DC

## 2015-07-06 MED ORDER — LORAZEPAM 1 MG PO TABS: 1.0000 mg | ORAL_TABLET | Freq: Three times a day (TID) | ORAL | Status: DC | PRN

## 2015-07-06 MED ORDER — ONDANSETRON HCL 4 MG PO TABS: 4.0000 mg | ORAL_TABLET | Freq: Three times a day (TID) | ORAL | Status: DC | PRN

## 2015-07-06 NOTE — ED Notes (Signed)
Pt ambulatory to C22 w/RN and spouse. Pt wearing maroon-colored paper scrubs and socks. Spouse has ALL of pt's belongings.

## 2015-07-06 NOTE — ED Notes (Signed)
Advised pt's spouse, [facility] advised may wait a couple of hours to perform TTS. Advised her she may leave after pt eats and pt may call to update her. Voiced understanding.

## 2015-07-06 NOTE — ED Provider Notes (Signed)
CSN: [id number]     Arrival date & time 07/06/15  1552 History   First MD Initiated Contact with Patient 07/06/15 1745     Chief Complaint  Patient presents with  . Suicidal     (Consider location/radiation/quality/duration/timing/severity/associated sxs/prior Treatment) HPI   50 year old male who presents with suicidal ideation. States that he has a long-standing history of depression and anxiety, which she has never sought psychiatric help for.  States that with the increased burden from his job recently,  His depression has been gradually worsening. He has had increasing anxiety, decreased sleep, and increased alcohol intake during this time. Earlier this week states that he had put a pistol in his mouth. Later told his wife about this. States that he has had increased suicidal ideation, but then states that he would never kill himself because he is "a God fearing man and could never do it to my wife."  States that he has been drinking about 4 cans of beer with 4 glasses of wine nightly to help him sleep. Prior to coming to the emergency department he had 9 beers and took 0.5 mg of Xanax. States that a few weeks ago he did ago through a period of time without drinking, and has not had any alcohol withdrawal symptoms including tremors, anxiety, or vomiting.   Denies chest pain, sob, fever, cough, abd pain, N/V/D, urinary issues.   Past Medical History  Diagnosis Date  . Psoriasis   . Osteoarthritis   . Anxiety   . Chronic back pain   . Chronic pain   . Alcohol abuse    Past Surgical History  Procedure Laterality Date  . Total hip arthroplasty    . Appendectomy    . Total hip arthroplasty Left 08/27/2014    Procedure: LEFT TOTAL HIP ARTHROPLASTY ANTERIOR APPROACH;  Surgeon: Durene Romans, MD;  Location: [facility];  Service: Orthopedics;  Laterality: Left;   History reviewed. No pertinent family history. Social History  Substance Use Topics  . Smoking status: Current Every Day Smoker --  0.50 packs/day for 30 years    Types: Cigarettes  . Smokeless tobacco: Never Used  . Alcohol Use: 8.4 oz/week    14 Cans of beer per week    Review of Systems 10/14 systems reviewed and are negative other than those stated in the HPI    Allergies  Review of patient's allergies indicates no known allergies.  Home Medications   Prior to Admission medications   Medication Sig Start Date End Date Taking? Authorizing Provider  ALPRAZolam (XANAX) 0.25 MG tablet Take 0.25 mg by mouth every 8 (eight) hours as needed. 05/26/15  Yes Historical Provider, MD  escitalopram (LEXAPRO) 20 MG tablet Take 20 mg by mouth daily.   Yes Historical Provider, MD  HYDROcodone-acetaminophen (NORCO) 10-325 MG tablet Take 1 tablet by mouth 3 (three) times daily as needed for moderate pain.  06/30/15  Yes Historical Provider, MD  Multiple Vitamin (MULTIVITAMIN WITH MINERALS) TABS tablet Take 1 tablet by mouth every morning.   Yes Historical Provider, MD  naproxen (NAPROSYN) 500 MG tablet Take 1 tablet (500 mg total) by mouth 2 (two) times daily as needed for mild pain, moderate pain or headache (TAKE WITH MEALS.). 06/04/15  Yes Mercedes Camprubi-Soms, PA-C  Tetrahydrozoline HCl (VISINE OP) Place 1 drop into both eyes daily as needed (for dry eyes).   Yes Historical Provider, MD  traMADol (ULTRAM) 50 MG tablet Take 100-150 mg by mouth every 4 (four) hours as needed for  moderate pain.  12/26/14  Yes Historical Provider, MD  amoxicillin (AMOXIL) 875 MG tablet Take 1 tablet (875 mg total) by mouth 2 (two) times daily. Patient not taking: Reported on 07/06/2015 03/27/15   Donita Brooks, MD  amoxicillin-clavulanate (AUGMENTIN) 875-125 MG tablet Take 1 tablet by mouth 2 (two) times daily. One po bid x 7 days Patient not taking: Reported on 07/06/2015 06/04/15   Mercedes Camprubi-Soms, PA-C  HYDROcodone-acetaminophen (NORCO) 5-325 MG tablet Take 1 tablet by mouth every 6 (six) hours as needed for severe pain. Patient not  taking: Reported on 07/06/2015 06/04/15   Mercedes Camprubi-Soms, PA-C   BP 99/63 mmHg  Pulse 73  Temp(Src) 98.1 F (36.7 C) (Oral)  Resp 16  Ht  (1.803 m)  Wt 208 lb (94.348 kg)  BMI 29.02 kg/m2  SpO2 97% Physical Exam Physical Exam  Nursing note and vitals reviewed. Constitutional: well developed, well nourished, slightly sleepy and appearing mildly intoxicated, non-toxic and in no acute distress Head: Normocephalic and atraumatic.  Mouth/Throat: Oropharynx is clear and moist.  Neck: Normal range of motion. Neck supple.  Cardiovascular: Normal rate and regular rhythm.   Pulmonary/Chest: Effort normal and breath sounds normal.  Abdominal: Soft. There is no tenderness. There is no rebound and no guarding.  Musculoskeletal: Normal range of motion.  Neurological: Alert, slurring of speech, no facial droop, fluent speech, moves all extremities symmetrically Skin: Skin is warm and dry.  Psychiatric: Cooperative  ED Course  Procedures (including critical care time) Labs Review Labs Reviewed  COMPREHENSIVE METABOLIC PANEL - Abnormal; Notable for the following:    Calcium 8.8 (*)    Total Protein 6.2 (*)    AST 46 (*)    All other components within normal limits  ETHANOL - Abnormal; Notable for the following:    Alcohol, Ethyl (B) 191 (*)    All other components within normal limits  ACETAMINOPHEN LEVEL - Abnormal; Notable for the following:    Acetaminophen (Tylenol), Serum <10 (*)    All other components within normal limits  URINE RAPID DRUG SCREEN, HOSP PERFORMED - Abnormal; Notable for the following:    Opiates POSITIVE (*)    Benzodiazepines POSITIVE (*)    All other components within normal limits  SALICYLATE LEVEL  CBC  URINALYSIS, ROUTINE W REFLEX MICROSCOPIC (NOT AT [facility])    Imaging Review No results found. I have personally reviewed and evaluated these images and lab results as part of my medical decision-making.   EKG Interpretation None      MDM    Final diagnoses:  Suicide ideation  Depression    50 year old male who presents with increased depression, suicidal thoughts, with recent plan of shooting himself with pistol. Mildly intoxicated on presentation, but able to hold conversation. Exam otherwise unremarkable. Blood work notable for alcohol level of 191.  Remainder of blood work and tox screen overall unremarkable. Was clinically sober when TTS evaluated patient. Recommending inpatient hospitalization, with bed placement at behavioral health Hospital. Accepted by Dr. Dub Mikes. Medically clear for transfer.    Lavera Guise, MD 07/06/15 2125

## 2015-07-06 NOTE — ED Notes (Signed)
Dinner tray ordered for pt

## 2015-07-06 NOTE — ED Notes (Signed)
Sitter has arrived.

## 2015-07-06 NOTE — ED Notes (Signed)
Pt states that he was seen by a counselor yesterday. Pt reports a hx of depression and alcohol. Pt states that last week he had a "gun in his mouth" per his wife. Pt agreed. Pt states that he would "never do that though because I am a god fearing man." pt denies any plan currently but states that he has thoughts.

## 2015-07-06 NOTE — ED Notes (Signed)
Telepsy complete 

## 2015-07-06 NOTE — ED Notes (Signed)
Pharmacy Tech in w/pt. 

## 2015-07-06 NOTE — BH Assessment (Addendum)
Tele Assessment Note   Howard [name] is a married 50 y.o. Caucasian male who presents unaccompanied to [facility] ED reporting symptoms of depression, anxiety and alcohol use. Pt reports he has experienced symptoms of depression for the past five years and over the past two years his symptoms have been more severe. Pt reports he has experience suicidal ideation for the past two weeks. Pt states that last week he went into the woods with a pistol, put it in his mouth and considered killing himself. Pt states he would not act on suicidal thoughts due to his convictions as a [place] and because he doesn't want to put his wife through such trauma. Pt states that today he decided to tell his wife and friends he needed help. Pt states he used to be enthusiastic about life but now"I wake up every morning and don't want to face the day." Pt reports symptoms including crying spells, social withdrawal, loss of interest in usual pleasures, fatigue, irritability, decreased concentration, decreased sleep, decreased appetite and feelings of guilt and hopelessness. He reports he has lost forty pounds in the past year. Pt denies any suicide attempts other than putting the gun in his mouth. He denies current homicidal ideation or history of violence. Pt denies any history of psychotic symptoms.   Pt reports he drinks alcohol to cope with depression and anxiety. He reports drinking 3-5 beers plus 3-5 glasses of wine after work during the week and 12 beers plus 3-5 glasses of wine on the weekend. Prior to coming to the emergency department he had 9 beers and took 0.5 mg of Xanax. He states he uses marijuana approximately once a month when offered by a friend. He denies other substance abuse. Pt's blood alcohol is 191 and urine drug screen is positive for opiates and benzodiazepines.   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 Pt says that he has no motivation but "I put  my clown on" to hide his emotions and goes to work. Pt lives with his wife, who he describes as very supportive. He has no children. Pt states that he has a great job, a loving wife, good friends, is financially secure and doesn't understand why he is depressed. He reports his relationship with his wife has suffered and he has no interest in sex because he has no motivation or interest in anything. Pt reports his father had a history of depression.  Pt denies any history of inpatient or outpatient mental health or substance abuse treatment. He states he has never been prescribed an antidepressant. Pt says his primary care physician prescribed Xanax 0.5 mg 2-3 tabs PRN and recently reduced the dosage to 0.25 mg 2-3 tabs PRN.   Pt is dressed in hospital scrubs, alert, oriented x4 with normal speech and normal motor behavior. Eye contact is good. Pt's mood is depressed, anxious and guilty; affect is congruent with mood. Thought process is coherent and relevant. There is no indication Pt is currently responding to internal stimuli or experiencing delusional thought content. Pt was pleasant and cooperative throughout assessment. He says he came to the ED anticipating he would need inpatient treatment for his depressive symptoms and alcohol use.   Diagnosis: Major Depressive Disorder, Recurrent, Severe Without Psychotic Features; Alcohol Use Disorder, Severe  Past Medical History:  Past Medical History  Diagnosis Date  . Psoriasis   . Osteoarthritis   . Anxiety   . Chronic back pain   .  Chronic pain   . Alcohol abuse     Past Surgical History  Procedure Laterality Date  . Total hip arthroplasty    . Appendectomy    . Total hip arthroplasty Left 08/27/2014    Procedure: LEFT TOTAL HIP ARTHROPLASTY ANTERIOR APPROACH;  Surgeon: Durene Romans, MD;  Location: [facility];  Service: Orthopedics;  Laterality: Left;    Family History: History reviewed. No pertinent family history.  Social History:  reports  that he has been smoking Cigarettes.  He has a 15 pack-year smoking history. He has never used smokeless tobacco. He reports that he drinks about 8.4 oz of alcohol per week. He reports that he does not use illicit drugs.  Additional Social History:  Alcohol / Drug Use Pain Medications: Denies abuse Prescriptions: Denies abuse Over the Counter: Denies abuse History of alcohol / drug use?: Yes Longest period of sobriety (when/how long): Unknown Negative Consequences of Use: Personal relationships Withdrawal Symptoms: Sweats Substance #1 Name of Substance 1: Alcohol 1 - Age of First Use: Adolescent 1 - Amount (size/oz): Up to 12 beers plus 5 glasses of wine 1 - Frequency: Daily 1 - Duration: Ongoing 1 - Last Use / Amount: 07/06/15  CIWA: CIWA-Ar BP: 99/63 mmHg Pulse Rate: 73 COWS:    PATIENT STRENGTHS: (choose at least two) Ability for insight Active sense of humor Average or above average intelligence Capable of independent living Metallurgist fund of knowledge Motivation for treatment/growth Physical Health Religious Affiliation Supportive family/friends Work skills  Allergies: No Known Allergies  Home Medications:  (Not in a hospital admission)  OB/GYN Status:  No LMP for male patient.  General Assessment Data Location of Assessment: [facility] ED TTS Assessment: In system Is this a Tele or Face-to-Face Assessment?: Tele Assessment Is this an Initial Assessment or a Re-assessment for this encounter?: Initial Assessment Marital status: Married [place] name: NA Is patient pregnant?: No Pregnancy Status: No Living Arrangements: Spouse/significant other Can pt return to current living arrangement?: Yes Admission Status: Voluntary Is patient capable of signing voluntary admission?: Yes Referral Source: Self/Family/Friend Insurance type: Medical sales representative     Crisis Care Plan Living Arrangements: Spouse/significant other Legal Guardian: Other:  (None) Name of Psychiatrist: None Name of Therapist: None  Education Status Is patient currently in school?: No Current Grade: NA Highest grade of school patient has completed: 12 Name of school: NA Contact person: NA  Risk to self with the past 6 months Suicidal Ideation: Yes-Currently Present Has patient been a risk to self within the past 6 months prior to admission? : Yes Suicidal Intent: No Has patient had any suicidal intent within the past 6 months prior to admission? : Yes Is patient at risk for suicide?: Yes Suicidal Plan?: Yes-Currently Present Has patient had any suicidal plan within the past 6 months prior to admission? : Yes Specify Current Suicidal Plan: Pt reports thoughts of shooting himself with a pistol Access to Means: Yes Specify Access to Suicidal Means: Pt has pistol What has been your use of drugs/alcohol within the last 12 months?: Pt is using alcohol daily Previous Attempts/Gestures: Yes How many times?: 1 Other Self Harm Risks: None Triggers for Past Attempts: None known Intentional Self Injurious Behavior: None Family Suicide History: No Recent stressful life event(s): Other (Comment) (Job stress) Persecutory voices/beliefs?: No Depression: Yes Depression Symptoms: Despondent, Tearfulness, Isolating, Fatigue, Guilt, Loss of interest in usual pleasures, Feeling worthless/self pity, Feeling angry/irritable Substance abuse history and/or treatment for substance abuse?: Yes Suicide prevention information given  to non-admitted patients: Not applicable  Risk to Others within the past 6 months Homicidal Ideation: No Does patient have any lifetime risk of violence toward others beyond the six months prior to admission? : No Thoughts of Harm to Others: No Current Homicidal Intent: No Current Homicidal Plan: No Access to Homicidal Means: No Identified Victim: None History of harm to others?: No Assessment of Violence: None Noted Violent Behavior  Description: Pt denies history of violence Does patient have access to weapons?: Yes (Comment) (Pt has firearms at home) Criminal Charges Pending?: No Does patient have a court date: No Is patient on probation?: No  Psychosis Hallucinations: None noted Delusions: None noted  Mental Status Report Appearance/Hygiene: In scrubs Eye Contact: Good Motor Activity: Unremarkable Speech: Logical/coherent Level of Consciousness: Alert, Other (Comment) (intoxicated) Mood: Depressed, Guilty, Anxious Affect: Depressed, Anxious Anxiety Level: Moderate Thought Processes: Coherent, Relevant Judgement: Unimpaired Orientation: Person, Place, Time, Situation, Appropriate for developmental age Obsessive Compulsive Thoughts/Behaviors: None  Cognitive Functioning Concentration: Fair Memory: Recent Intact, Remote Intact IQ: Average Insight: Good Impulse Control: Fair Appetite: Fair Weight Loss: 40 (Forty pound weight loss in the past year) Weight Gain: 0 Sleep: Decreased Total Hours of Sleep: 4 Vegetative Symptoms: None  ADLScreening [facility] Assessment Services) Patient's cognitive ability adequate to safely complete daily activities?: Yes Patient able to express need for assistance with ADLs?: Yes Independently performs ADLs?: Yes (appropriate for developmental age)  Prior Inpatient Therapy Prior Inpatient Therapy: No Prior Therapy Dates: NA Prior Therapy Facilty/Provider(s): NA Reason for Treatment: NA  Prior Outpatient Therapy Prior Outpatient Therapy: No Prior Therapy Dates: NA Prior Therapy Facilty/Provider(s): NA Reason for Treatment: NA Does patient have an ACCT team?: No Does patient have Intensive In-House Services?  : No Does patient have [facility] services? : No Does patient have P4CC services?: No  ADL Screening (condition at time of admission) Patient's cognitive ability adequate to safely complete daily activities?: Yes Is the patient deaf or have difficulty hearing?:  No Does the patient have difficulty seeing, even when wearing glasses/contacts?: No Does the patient have difficulty concentrating, remembering, or making decisions?: No Patient able to express need for assistance with ADLs?: Yes Does the patient have difficulty dressing or bathing?: No Independently performs ADLs?: Yes (appropriate for developmental age) Does the patient have difficulty walking or climbing stairs?: No Weakness of Legs: None Weakness of Arms/Hands: None  Home Assistive Devices/Equipment Home Assistive Devices/Equipment: None    Abuse/Neglect Assessment (Assessment to be complete while patient is alone) Physical Abuse: Denies Verbal Abuse: Denies Sexual Abuse: Denies Exploitation of patient/patient's resources: Denies Self-Neglect: Denies     Merchant navy officer (For Healthcare) Does patient have an advance directive?: Yes Type of Advance Directive: Healthcare Power of Attorney Does patient want to make changes to advanced directive?: No - Patient declined Copy of advanced directive(s) in chart?: No - copy requested    Additional Information 1:1 In Past 12 Months?: No CIRT Risk: No Elopement Risk: No Does patient have medical clearance?: Yes     Disposition: Binnie Rail, AC at [facility], confirmed bed availability after 2300. Gave clinical report to Hulan Fess, NP who said Pt meets criteria for dual diagnosis treatment and accepted Pt to the service of Dr. Geoffery Lyons, room 304-1. Notified Lavera Guise, MD and Herbert Deaner, RN of acceptance and that bed will be available after 2300.  Disposition Initial Assessment Completed for this Encounter: Yes Disposition of Patient: Inpatient treatment program Type of inpatient treatment program: Adult   Harlin Rain  Patsy [place], [facility], [facility], [facility] Triage Specialist [phone]   Pamalee Leyden 07/06/2015 9:19 PM

## 2015-07-06 NOTE — ED Notes (Signed)
The pt was awakened from sleep .  He has no pain not nervous no tremors.  Co-operative  He was informed that he would be going to behavorial shortly.  Water given.  Skin warm and dry   Sitter within arms reach

## 2015-07-06 NOTE — ED Notes (Signed)
The pt went right back to sleep following the  Tele psy  snoring at present

## 2015-07-06 NOTE — ED Notes (Signed)
Pt states took Xanax 0.25mg  x 4 PTA d/t was nervous about coming to ED. Denies was SI attempt. Spouse w/pt. Pt denies experiencing withdrawals from ETOH. Denies drug use except for marijuana.

## 2015-07-06 NOTE — ED Notes (Signed)
Report called to linda at behavorial health

## 2015-07-06 NOTE — ED Notes (Signed)
Sitter at the bedside pt snoring

## 2015-07-06 NOTE — ED Notes (Signed)
Sending u/a slip to lab so may add on to UDS.

## 2015-07-06 NOTE — ED Notes (Signed)
Call placed to [facility] to transport it will be  approx 45 minutes

## 2015-07-06 NOTE — ED Notes (Signed)
Pt's spouse leaving at this time. She is to call pharmacy tech back w/anti-depressant med pt takes.

## 2015-07-06 NOTE — ED Notes (Signed)
[facility] will accept at 2300

## 2015-07-07 ENCOUNTER — Encounter (HOSPITAL_COMMUNITY): Payer: Self-pay

## 2015-07-07 ENCOUNTER — Inpatient Hospital Stay (HOSPITAL_COMMUNITY)
Admission: AD | Admit: 2015-07-07 | Discharge: 2015-07-09 | DRG: 885 | Disposition: A | Discharge status: Home / Self Care | Payer: 59 | Source: Intra-hospital | Attending: Psychiatry | Admitting: Psychiatry

## 2015-07-07 DIAGNOSIS — F1020 Alcohol dependence, uncomplicated: Secondary | ICD-10-CM | POA: Diagnosis present

## 2015-07-07 DIAGNOSIS — F17210 Nicotine dependence, cigarettes, uncomplicated: Secondary | ICD-10-CM | POA: Diagnosis present

## 2015-07-07 DIAGNOSIS — Z23 Encounter for immunization: Secondary | ICD-10-CM | POA: Diagnosis not present

## 2015-07-07 DIAGNOSIS — Z96642 Presence of left artificial hip joint: Secondary | ICD-10-CM | POA: Diagnosis present

## 2015-07-07 DIAGNOSIS — R45851 Suicidal ideations: Secondary | ICD-10-CM | POA: Diagnosis present

## 2015-07-07 DIAGNOSIS — G4700 Insomnia, unspecified: Secondary | ICD-10-CM | POA: Diagnosis present

## 2015-07-07 DIAGNOSIS — F419 Anxiety disorder, unspecified: Secondary | ICD-10-CM | POA: Diagnosis present

## 2015-07-07 DIAGNOSIS — F332 Major depressive disorder, recurrent severe without psychotic features: Principal | ICD-10-CM | POA: Diagnosis present

## 2015-07-07 MED ORDER — ADULT MULTIVITAMIN W/MINERALS CH
1.0000 | ORAL_TABLET | Freq: Every day | ORAL | Status: DC
  Administered 2015-07-07 – 2015-07-09 (×3): 1 via ORAL
  Filled 2015-07-07 (×4): qty 1

## 2015-07-07 MED ORDER — LORAZEPAM 1 MG PO TABS: 1.0000 mg | ORAL_TABLET | Freq: Two times a day (BID) | ORAL | Status: DC

## 2015-07-07 MED ORDER — ESCITALOPRAM OXALATE 20 MG PO TABS
20.0000 mg | ORAL_TABLET | Freq: Every day | ORAL | Status: DC
  Administered 2015-07-07: 20 mg via ORAL
  Filled 2015-07-07 (×2): qty 1

## 2015-07-07 MED ORDER — VITAMIN B-1 100 MG PO TABS
100.0000 mg | ORAL_TABLET | Freq: Every day | ORAL | Status: DC
  Administered 2015-07-08 – 2015-07-09 (×2): 100 mg via ORAL
  Filled 2015-07-07 (×3): qty 1

## 2015-07-07 MED ORDER — LORAZEPAM 1 MG PO TABS
1.0000 mg | ORAL_TABLET | Freq: Four times a day (QID) | ORAL | Status: DC | PRN
  Administered 2015-07-08: 1 mg via ORAL
  Filled 2015-07-07: qty 1

## 2015-07-07 MED ORDER — LOPERAMIDE HCL 2 MG PO CAPS: 2.0000 mg | ORAL_CAPSULE | ORAL | Status: DC | PRN

## 2015-07-07 MED ORDER — ALUM & MAG HYDROXIDE-SIMETH 200-200-20 MG/5ML PO SUSP: 30.0000 mL | ORAL | Status: DC | PRN

## 2015-07-07 MED ORDER — MAGNESIUM HYDROXIDE 400 MG/5ML PO SUSP: 30.0000 mL | Freq: Every day | ORAL | Status: DC | PRN

## 2015-07-07 MED ORDER — NAPROXEN 500 MG PO TABS
500.0000 mg | ORAL_TABLET | Freq: Two times a day (BID) | ORAL | Status: DC | PRN
  Administered 2015-07-08 – 2015-07-09 (×2): 500 mg via ORAL
  Filled 2015-07-07 (×2): qty 1

## 2015-07-07 MED ORDER — ESCITALOPRAM OXALATE 10 MG PO TABS
10.0000 mg | ORAL_TABLET | Freq: Every day | ORAL | Status: DC
  Administered 2015-07-08 – 2015-07-09 (×2): 10 mg via ORAL
  Filled 2015-07-07 (×3): qty 1

## 2015-07-07 MED ORDER — ONDANSETRON 4 MG PO TBDP
4.0000 mg | ORAL_TABLET | Freq: Four times a day (QID) | ORAL | Status: DC | PRN
  Administered 2015-07-09: 4 mg via ORAL
  Filled 2015-07-07: qty 1

## 2015-07-07 MED ORDER — HYDROXYZINE HCL 25 MG PO TABS: 25.0000 mg | ORAL_TABLET | Freq: Four times a day (QID) | ORAL | Status: DC | PRN

## 2015-07-07 MED ORDER — THIAMINE HCL 100 MG/ML IJ SOLN: 100.0000 mg | Freq: Once | INTRAMUSCULAR | Status: DC

## 2015-07-07 MED ORDER — LORAZEPAM 1 MG PO TABS
1.0000 mg | ORAL_TABLET | Freq: Three times a day (TID) | ORAL | Status: DC
  Administered 2015-07-08 – 2015-07-09 (×2): 1 mg via ORAL
  Filled 2015-07-07 (×2): qty 1

## 2015-07-07 MED ORDER — LORAZEPAM 1 MG PO TABS: 1.0000 mg | ORAL_TABLET | Freq: Every day | ORAL | Status: DC

## 2015-07-07 MED ORDER — TRAZODONE HCL 100 MG PO TABS
100.0000 mg | ORAL_TABLET | Freq: Every evening | ORAL | Status: DC | PRN
  Administered 2015-07-07 – 2015-07-08 (×2): 100 mg via ORAL
  Filled 2015-07-07 (×2): qty 1

## 2015-07-07 MED ORDER — LORAZEPAM 1 MG PO TABS
1.0000 mg | ORAL_TABLET | Freq: Four times a day (QID) | ORAL | Status: AC
  Administered 2015-07-07 – 2015-07-08 (×6): 1 mg via ORAL
  Filled 2015-07-07 (×6): qty 1

## 2015-07-07 MED ORDER — ACETAMINOPHEN 325 MG PO TABS
650.0000 mg | ORAL_TABLET | Freq: Four times a day (QID) | ORAL | Status: DC | PRN
  Administered 2015-07-08: 650 mg via ORAL
  Filled 2015-07-07: qty 2

## 2015-07-07 NOTE — Progress Notes (Signed)
Patient ID: Howard [name], male   DOB: 12-24-1965, 50 y.o.   MRN: [id number] Admission note: D:Patient is a voluntary admission in no acute distress for depression, anxiety, and alcohol abuse. Pt report he has been drinking for over 20 years. Pt reports he drinks about 12 beers and some vodka daily and more on weekends. Pt reports drinking has increased 7 years ago due to his stressful job. Pt reports chronic pain in hips, knees, back and fingers. Pt reports he had a left total hip replaced in April of 2016. Pt denies SI/HI. Pt has a scab on rt calf from a dog bite.  A: Pt admitted to unit per protocol, skin assessment and belonging search done.  Consent signed by pt. Pt educated on therapeutic milieu rules. Pt was introduced to milieu by nursing staff. Fall risk safety plan explained to the patient. 15 minutes checks started for safety.  R: Pt was receptive to education. Writer offered support.

## 2015-07-07 NOTE — [facility] Group Notes (Signed)
[facility] LCSW Group Therapy  07/07/2015 1:05 PM  Type of Therapy:  Group Therapy  Participation Level:  Did Not Attend-pt sleeping in room. Invited.   Modes of Intervention:  Confrontation, Discussion, Education, Exploration, Problem-solving, Rapport Building, Socialization and Support   Summary of Progress/Problems: Today's Topic: Overcoming Obstacles. Patients identified one short term goal and potential obstacles in reaching this goal. Patients processed barriers involved in overcoming these obstacles. Patients identified steps necessary for overcoming these obstacles and explored motivation (internal and external) for facing these difficulties head on.   Smart, [place] Borchardt LCSW 07/07/2015, 1:05 PM

## 2015-07-07 NOTE — Tx Team (Signed)
Initial Interdisciplinary Treatment Plan   PATIENT STRESSORS: Health problems Substance abuse   PATIENT STRENGTHS: Ability for insight Average or above average intelligence General fund of knowledge Motivation for treatment/growth Supportive family/friends   PROBLEM LIST: Problem List/Patient Goals Date to be addressed Date deferred Reason deferred Estimated date of resolution  depression 07/07/2015     anxiety 07/07/2015     Substance abuse 07/07/2015     Risk for suicide 07/07/2015     "relieving anxiety/depression" 07/07/2015                              DISCHARGE CRITERIA:  Ability to meet basic life and health needs Improved stabilization in mood, thinking, and/or behavior Motivation to continue treatment in a less acute level of care Withdrawal symptoms are absent or subacute and managed without 24-hour nursing intervention  PRELIMINARY DISCHARGE PLAN: Attend 12-step recovery group Outpatient therapy Return to previous living arrangement Return to previous work or school arrangements  PATIENT/FAMIILY INVOLVEMENT: This treatment plan has been presented to and reviewed with the patient, Howard [name],  The patient and family have been given the opportunity to ask questions and make suggestions.  Howard [name], Olivianna Higley K 07/07/2015, 3:31 AM

## 2015-07-07 NOTE — Progress Notes (Signed)
DAR NOTE: Pt present with flat affect and depressed mood in the unit. Pt has been observed interacting with peers and staff in the dayroom.Pt denies physical pain, took all his meds as scheduled. As per self inventory, pt had a good night sleep, fair appetite, normal energy, and poor concentration. Pt rate depression at 5, hopeless ness at 4, anxiety at 7. Pt gaol for today is to "to be honest with myself and doctors/staff." Pt's safety ensured with 15 minute and environmental checks. Pt currently denies SI/HI and A/V hallucinations. Pt verbally agrees to seek staff if SI/HI or A/VH occurs and to consult with staff before acting on these thoughts. Will continue POC.

## 2015-07-07 NOTE — Tx Team (Signed)
Interdisciplinary Treatment Plan Update (Adult)  Date:  07/07/2015  Time Reviewed:  11:40 AM   Progress in Treatment: Attending groups: Yes. Participating in groups:  Yes. Taking medication as prescribed:  Yes. Tolerating medication:  Yes. Family/Significant othe contact made:  SPE required for this pt.  Patient understands diagnosis:  Yes. and As evidenced by:  seeking treatment for ETOH abuse, depression, SI, and medication management.  Discussing patient identified problems/goals with staff:  Yes. Medical problems stabilized or resolved:  Yes. Denies suicidal/homicidal ideation: Yes. Issues/concerns per patient self-inventory:  Other:  Discharge Plan or Barriers: CSW assessing. Pt requesting [facility] referral.   Reason for Continuation of Hospitalization: Depression Medication stabilization Withdrawal symptoms  Comments:  Howard [name] is a married 50 y.o. Caucasian male who presents unaccompanied to [facility] ED reporting symptoms of depression, anxiety and alcohol use. Pt reports he has experienced symptoms of depression for the past five years and over the past two years his symptoms have been more severe. Pt reports he has experience suicidal ideation for the past two weeks. Pt states that last week he went into the woods with a pistol, put it in his mouth and considered killing himself. Pt states he would not act on suicidal thoughts due to his convictions as a [place] and because he doesn't want to put his wife through such trauma. Pt states that today he decided to tell his wife and friends he needed help. Pt states he used to be enthusiastic about life but now"I wake up every morning and don't want to face the day." Pt reports symptoms including crying spells, social withdrawal, loss of interest in usual pleasures, fatigue, irritability, decreased concentration, decreased sleep, decreased appetite and feelings of guilt and hopelessness. He reports he has lost forty  pounds in the past year. Pt denies any suicide attempts other than putting the gun in his mouth. He denies current homicidal ideation or history of violence. Pt denies any history of psychotic symptoms. Pt reports he drinks alcohol to cope with depression and anxiety. He reports drinking 3-5 beers plus 3-5 glasses of wine after work during the week and 12 beers plus 3-5 glasses of wine on the weekend. Prior to coming to the emergency department he had 9 beers and took 0.5 mg of Xanax. He states he uses marijuana approximately once a month when offered by a friend. He denies other substance abuse. Pt's blood alcohol is 191 and urine drug screen is positive for opiates and benzodiazepines. 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 Pt says that he has no motivation but "I put my clown on" to hide his emotions and goes to work. Pt lives with his wife, who he describes as very supportive. He has no children. Pt states that he has a great job, a loving wife, good friends, is financially secure and doesn't understand why he is depressed. He reports his relationship with his wife has suffered and he has no interest in sex because he has no motivation or interest in anything. Pt reports his father had a history of depression. Pt denies any history of inpatient or outpatient mental health or substance abuse treatment. He states he has never been prescribed an antidepressant. Pt says his primary care physician prescribed Xanax 0.5 mg 2-3 tabs PRN and recently reduced the dosage to 0.25 mg 2-3 tabs PRN. Diagnosis: Major Depressive Disorder, Recurrent, Severe Without Psychotic Features; Alcohol Use Disorder, Severe   Estimated  length of stay:  3-5 days   New goal(s): to develop effective aftercare plan.   Additional Comments:  Patient and CSW reviewed pt's identified goals and treatment plan. Patient verbalized understanding and agreed to treatment plan. CSW reviewed  [facility] "Discharge Process and Patient Involvement" Form. Pt verbalized understanding of information provided and signed form.    Review of initial/current patient goals per problem list:  1. Goal(s): Patient will participate in aftercare plan  Met: Goal progressing.   Target date: at discharge  As evidenced by: Patient will participate within aftercare plan AEB aftercare provider and housing plan at discharge being identified.  2/20: Pt reports that he would like [facility].   2. Goal (s): Patient will exhibit decreased depressive symptoms and suicidal ideations.  Met: No.    Target date: at discharge  As evidenced by: Patient will utilize self rating of depression at 3 or below and demonstrate decreased signs of depression or be deemed stable for discharge by MD.  2/20: Pt rates depression as 5/10 and presents with pleasant mood/anxious affect. Denies SI/HI/AVH.   3. Goal(s): Patient will demonstrate decreased signs and symptoms of anxiety.  Met:No.   Target date: at discharge  As evidenced by: Patient will utilize self rating of anxiety at 3 or below and demonstrated decreased signs of anxiety, or be deemed stable for discharge by MD  2/20: Pt rates anxiety as 7/10 and denies SI/HI/AVH.   4. Goal(s): Patient will demonstrate decreased signs of withdrawal due to substance abuse  Met:Yes   Target date:at discharge   As evidenced by: Patient will produce a CIWA/COWS score of 0, have stable vitals signs, and no symptoms of withdrawal.  2/20: Pt reports no withdrawals with CIWA score of 0 and stable vitals.   Attendees: Patient:   07/07/2015 11:40 AM   Family:   07/07/2015 11:40 AM   Physician:  Dr. Carlton Adam, MD 07/07/2015 11:40 AM   Nursing:   Parthenia Ames RN 07/07/2015 11:40 AM   Clinical Social Worker: Maxie Better, LCSW 07/07/2015 11:40 AM   Clinical Social Worker: Erasmo Downer Drinkard LCSWA; Peri Maris LCSWA 07/07/2015 11:40 AM    07/07/2015  11:40 AM    07/07/2015 11:40 AM   Other:   07/07/2015 11:40 AM   Other:  07/07/2015 11:40 AM   Other:  07/07/2015 11:40 AM   Other:  07/07/2015 11:40 AM    07/07/2015 11:40 AM    07/07/2015 11:40 AM    07/07/2015 11:40 AM    07/07/2015 11:40 AM    Scribe for Treatment Team:   Maxie Better, LCSW 07/07/2015 11:40 AM

## 2015-07-07 NOTE — [facility] Counselor (Signed)
CSW attempted to meet with pt this afternoon to complete PSA-pt sleeping in room. Will attempt in the morning.  Trula Slade, MSW, LCSW Clinical Social Worker 07/07/2015 3:45 PM

## 2015-07-07 NOTE — ED Notes (Signed)
[facility] here transporting pt to behavorial

## 2015-07-07 NOTE — Progress Notes (Signed)
Pt did not attend evening AA group. Pt was asleep.  

## 2015-07-07 NOTE — ED Notes (Signed)
The pt has talked to his wife on the phone.  She was told he was being transferred tonight

## 2015-07-07 NOTE — H&P (Signed)
Psychiatric Admission Assessment Adult  Patient Identification: Howard [name] MRN:  [id number] Date of Evaluation:  07/07/2015 Chief Complaint:  mdd,rec,sev etoh use disorder,sev Principal Diagnosis: MDD (major depressive disorder), recurrent severe, without psychosis (HCC) Diagnosis:   Patient Active Problem List   Diagnosis Date Noted  . MDD (major depressive disorder), recurrent severe, without psychosis (HCC) [F33.2] 07/07/2015  . Alcohol use disorder, severe, dependence (HCC) [F10.20] 07/07/2015  . Obese [E66.9] 08/28/2014  . S/P left [facility], AA [Z96.60] 08/27/2014   History of Present Illness:: 50 Y/O male who endorses that in the past 10 years he has been drinking too much. Started when he was 20. States he will drink every day 14-15 beers and wine on weekends. 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 States that his drinking could affect the relationship with his wife of 30 years 24 years married. Has no children. States she also drinks but not like he does. He can drink a one or two and put them down. He is not able to do that. Endorses a lot of anxiety. States his PCP was prescribing Xanax 0.5 mg TID and decreased it to 0.25 mg TID. Finds that he increased his alcohol intake to compensate for the decrease in Xanax.  Howard [name] is a married 50 y.o. Caucasian male who presents unaccompanied to [facility] ED reporting symptoms of depression, anxiety and alcohol use. Pt reports he has experienced symptoms of depression for the past five years and over the past two years his symptoms have been more severe. Pt reports he has experience suicidal ideation for the past two weeks. Pt states that last week he went into the woods with a pistol, put it in his mouth and considered killing himself. Pt states he would not act on suicidal thoughts due to his convictions as a [place] and because he doesn't want to put his wife through such trauma. Pt states that today he decided to tell his  wife and friends he needed help. Pt states he used to be enthusiastic about life but now"I wake up every morning and don't want to face the day." Pt reports symptoms including crying spells, social withdrawal, loss of interest in usual pleasures, fatigue, irritability, decreased concentration, decreased sleep, decreased appetite and feelings of guilt and hopelessness. He reports he has lost forty pounds in the past year. Pt denies any suicide attempts other than putting the gun in his mouth. He denies current homicidal ideation or history of violence. Pt denies any history of psychotic symptoms.   Pt reports he drinks alcohol to cope with depression and anxiety. He reports drinking 3-5 beers plus 3-5 glasses of wine after work during the week and 12 beers plus 3-5 glasses of wine on the weekend. Prior to coming to the emergency department he had 9 beers and took 0.5 mg of Xanax. He states he uses marijuana approximately once a month when offered by a friend. He denies other substance abuse. Pt's blood alcohol is 191 and urine drug screen is positive for opiates and benzodiazepines.  .   Associated Signs/Symptoms: Depression Symptoms:  insomnia, feelings of worthlessness/guilt, anxiety, disturbed sleep,  Lack of energy lack of motivation decreased concentration (Hypo) Manic Symptoms:  Irritable Mood, Labiality of Mood, usually when drinking Anxiety Symptoms:  Excessive Worry, Psychotic Symptoms:  denies  PTSD Symptoms: Negative Total Time spent with patient: 45 minutes  Past Psychiatric History:   Is the patient at risk to self? No.  Has the patient been a  risk to self in the past 6 months? No.  Has the patient been a risk to self within the distant past? No.  Is the patient a risk to others? No.  Has the patient been a risk to others in the past 6 months? No.  Has the patient been a risk to others within the distant past? No.   Prior Inpatient Therapy:  Denies  Prior Outpatient Therapy:   PCP prescribed Xanax 0.5 mg TID PRN was cut to 0.25 mg TID   Alcohol Screening: 1. How often do you have a drink containing alcohol?: 4 or more times a week 2. How many drinks containing alcohol do you have on a typical day when you are drinking?: 7, 8, or 9 3. How often do you have six or more drinks on one occasion?: Daily or almost daily Preliminary Score: 7 4. How often during the last year have you found that you were not able to stop drinking once you had started?: Daily or almost daily 5. How often during the last year have you failed to do what was normally expected from you becasue of drinking?: Daily or almost daily 6. How often during the last year have you needed a first drink in the morning to get yourself going after a heavy drinking session?: Weekly 7. How often during the last year have you had a feeling of guilt of remorse after drinking?: Daily or almost daily 8. How often during the last year have you been unable to remember what happened the night before because you had been drinking?: Daily or almost daily 9. Have you or someone else been injured as a result of your drinking?: No 10. Has a relative or friend or a doctor or another health worker been concerned about your drinking or suggested you cut down?: Yes, during the last year Alcohol Use Disorder Identification Test Final Score (AUDIT): 34 Brief Intervention: Yes Substance Abuse History in the last 12 months:  Yes.   Consequences of Substance Abuse: Legal Consequences:  2 DWI'S Previous Psychotropic Medications: Yes  Psychological Evaluations: No  Past Medical History:  Past Medical History  Diagnosis Date  . Psoriasis   . Osteoarthritis   . Anxiety   . Chronic back pain   . Chronic pain   . Alcohol abuse     Past Surgical History  Procedure Laterality Date  . Total hip arthroplasty    . Appendectomy    . Total hip arthroplasty Left 08/27/2014    Procedure: LEFT TOTAL HIP ARTHROPLASTY ANTERIOR APPROACH;   Surgeon: Paralee Cancel, MD;  Location: [facility] ORS;  Service: Orthopedics;  Laterality: Left;   Family History: History reviewed. No pertinent family history. Family Psychiatric  History: father had anxiety, (valium Xanax) sister alcohol Tobacco Screening: _0 [phone])::1)@ Social History:  History  Alcohol Use  . 8.4 oz/week  . 14 Cans of beer per week     History  Drug Use No   work for [employer] 25 years, 24 years married no kids  GED then started working financially OK  Additional Social History:                           Allergies:  No Known Allergies Lab Results:  Results for orders placed or performed during the hospital encounter of 07/06/15 (from the past 48 hour(s))  Comprehensive metabolic panel     Status: Abnormal   Collection Time: 07/06/15  4:41 PM  Result Value  Ref Range   Sodium 139 135 - 145 mmol/L   Potassium 4.2 3.5 - 5.1 mmol/L   Chloride 103 101 - 111 mmol/L   CO2 26 22 - 32 mmol/L   Glucose, Bld 94 65 - 99 mg/dL   BUN 14 6 - 20 mg/dL   Creatinine, Ser 0.67 0.61 - 1.24 mg/dL   Calcium 8.8 (L) 8.9 - 10.3 mg/dL   Total Protein 6.2 (L) 6.5 - 8.1 g/dL   Albumin 4.0 3.5 - 5.0 g/dL   AST 46 (H) 15 - 41 U/L   ALT 53 17 - 63 U/L   Alkaline Phosphatase 88 38 - 126 U/L   Total Bilirubin 1.0 0.3 - 1.2 mg/dL   GFR calc non Af Amer >60 >60 mL/min   GFR calc Af Amer >60 >60 mL/min    Comment: (NOTE) The eGFR has been calculated using the CKD EPI equation. This calculation has not been validated in all clinical situations. eGFR's persistently <60 mL/min signify possible Chronic Kidney Disease.    Anion gap 10 5 - 15  Ethanol (ETOH)     Status: Abnormal   Collection Time: 07/06/15  4:41 PM  Result Value Ref Range   Alcohol, Ethyl (B) 191 (H) <5 mg/dL    Comment:        LOWEST DETECTABLE LIMIT FOR SERUM ALCOHOL IS 5 mg/dL FOR MEDICAL PURPOSES ONLY   Salicylate level     Status: None   Collection Time: 07/06/15  4:41 PM  Result Value Ref  Range   Salicylate Lvl <4.3 2.8 - 30.0 mg/dL  Acetaminophen level     Status: Abnormal   Collection Time: 07/06/15  4:41 PM  Result Value Ref Range   Acetaminophen (Tylenol), Serum <10 (L) 10 - 30 ug/mL    Comment:        THERAPEUTIC CONCENTRATIONS VARY SIGNIFICANTLY. A RANGE OF 10-30 ug/mL MAY BE AN EFFECTIVE CONCENTRATION FOR MANY PATIENTS. HOWEVER, SOME ARE BEST TREATED AT CONCENTRATIONS OUTSIDE THIS RANGE. ACETAMINOPHEN CONCENTRATIONS >150 ug/mL AT 4 HOURS AFTER INGESTION AND >50 ug/mL AT 12 HOURS AFTER INGESTION ARE OFTEN ASSOCIATED WITH TOXIC REACTIONS.   CBC     Status: None   Collection Time: 07/06/15  4:41 PM  Result Value Ref Range   WBC 7.6 4.0 - 10.5 K/uL   RBC 4.44 4.22 - 5.81 MIL/uL   Hemoglobin 14.7 13.0 - 17.0 g/dL   HCT 43.3 39.0 - 52.0 %   MCV 97.5 78.0 - 100.0 fL   MCH 33.1 26.0 - 34.0 pg   MCHC 33.9 30.0 - 36.0 g/dL   RDW 12.3 11.5 - 15.5 %   Platelets 251 150 - 400 K/uL  Urine rapid drug screen (hosp performed) (Not at [facility])     Status: Abnormal   Collection Time: 07/06/15  6:24 PM  Result Value Ref Range   Opiates POSITIVE (A) NONE DETECTED   Cocaine NONE DETECTED NONE DETECTED   Benzodiazepines POSITIVE (A) NONE DETECTED   Amphetamines NONE DETECTED NONE DETECTED   Tetrahydrocannabinol NONE DETECTED NONE DETECTED   Barbiturates NONE DETECTED NONE DETECTED    Comment:        DRUG SCREEN FOR MEDICAL PURPOSES ONLY.  IF CONFIRMATION IS NEEDED FOR ANY PURPOSE, NOTIFY LAB WITHIN 5 DAYS.        LOWEST DETECTABLE LIMITS FOR URINE DRUG SCREEN Drug Class       Cutoff (ng/mL) Amphetamine      1000 Barbiturate      200 Benzodiazepine  518 Tricyclics       841 Opiates          300 Cocaine          300 THC              50   Urinalysis, Routine w reflex microscopic (not at [facility])     Status: None   Collection Time: 07/06/15  6:49 PM  Result Value Ref Range   Color, Urine YELLOW YELLOW   APPearance CLEAR CLEAR   Specific Gravity, Urine 1.014  1.005 - 1.030   pH 5.0 5.0 - 8.0   Glucose, UA NEGATIVE NEGATIVE mg/dL   Hgb urine dipstick NEGATIVE NEGATIVE   Bilirubin Urine NEGATIVE NEGATIVE   Ketones, ur NEGATIVE NEGATIVE mg/dL   Protein, ur NEGATIVE NEGATIVE mg/dL   Nitrite NEGATIVE NEGATIVE   Leukocytes, UA NEGATIVE NEGATIVE    Comment: MICROSCOPIC NOT DONE ON URINES WITH NEGATIVE PROTEIN, BLOOD, LEUKOCYTES, NITRITE, OR GLUCOSE <1000 mg/dL.    Blood Alcohol level:  Lab Results  Component Value Date   [facility] 191* 66/10/3014    Metabolic Disorder Labs:  No results found for: HGBA1C, MPG No results found for: PROLACTIN Lab Results  Component Value Date   CHOL 186 03/27/2015   TRIG 92 03/27/2015   HDL 61 03/27/2015   CHOLHDL 3.0 03/27/2015   VLDL 18 03/27/2015   LDLCALC 107 03/27/2015    Current Medications: Current Facility-Administered Medications  Medication Dose Route Frequency Provider Last Rate Last Dose  . acetaminophen (TYLENOL) tablet 650 mg  650 mg Oral Q6H PRN Harriet Butte, NP      . alum & mag hydroxide-simeth (MAALOX/MYLANTA) 200-200-20 MG/5ML suspension 30 mL  30 mL Oral Q4H PRN Harriet Butte, NP      . escitalopram (LEXAPRO) tablet 20 mg  20 mg Oral Daily Harriet Butte, NP   20 mg at 07/07/15 0859  . hydrOXYzine (ATARAX/VISTARIL) tablet 25 mg  25 mg Oral Q6H PRN Harriet Butte, NP      . loperamide (IMODIUM) capsule 2-4 mg  2-4 mg Oral PRN Harriet Butte, NP      . LORazepam (ATIVAN) tablet 1 mg  1 mg Oral Q6H PRN Harriet Butte, NP      . LORazepam (ATIVAN) tablet 1 mg  1 mg Oral QID Harriet Butte, NP   1 mg at 07/07/15 1203   Followed by  . [START ON 07/08/2015] LORazepam (ATIVAN) tablet 1 mg  1 mg Oral TID Harriet Butte, NP       Followed by  . [START ON 07/09/2015] LORazepam (ATIVAN) tablet 1 mg  1 mg Oral BID Harriet Butte, NP       Followed by  . [START ON 07/11/2015] LORazepam (ATIVAN) tablet 1 mg  1 mg Oral Daily Harriet Butte, NP      . magnesium hydroxide (MILK OF MAGNESIA)  suspension 30 mL  30 mL Oral Daily PRN Harriet Butte, NP      . multivitamin with minerals tablet 1 tablet  1 tablet Oral Daily Harriet Butte, NP   1 tablet at 07/07/15 0859  . naproxen (NAPROSYN) tablet 500 mg  500 mg Oral BID PRN Harriet Butte, NP      . ondansetron (ZOFRAN-ODT) disintegrating tablet 4 mg  4 mg Oral Q6H PRN Harriet Butte, NP      . thiamine (B-1) injection 100 mg  100 mg Intramuscular Once Harriet Butte,  NP   100 mg at 07/07/15 0230  . [START ON 07/08/2015] thiamine (VITAMIN B-1) tablet 100 mg  100 mg Oral Daily Harriet Butte, NP      . traZODone (DESYREL) tablet 100 mg  100 mg Oral QHS PRN Harriet Butte, NP       PTA Medications: Prescriptions prior to admission  Medication Sig Dispense Refill Last Dose  . ALPRAZolam (XANAX) 0.25 MG tablet Take 0.25 mg by mouth every 8 (eight) hours as needed.  0 07/06/2015 at Unknown time  . escitalopram (LEXAPRO) 20 MG tablet Take 20 mg by mouth daily.   07/06/2015 at Unknown time  . HYDROcodone-acetaminophen (NORCO) 10-325 MG tablet Take 1 tablet by mouth 3 (three) times daily as needed for moderate pain.   0 07/06/2015 at 1330  . Multiple Vitamin (MULTIVITAMIN WITH MINERALS) TABS tablet Take 1 tablet by mouth every morning.   07/05/2015 at Unknown time  . naproxen (NAPROSYN) 500 MG tablet Take 1 tablet (500 mg total) by mouth 2 (two) times daily as needed for mild pain, moderate pain or headache (TAKE WITH MEALS.). 20 tablet 0 07/04/2015  . Tetrahydrozoline HCl (VISINE OP) Place 1 drop into both eyes daily as needed (for dry eyes).   2 weeks  . [DISCONTINUED] amoxicillin (AMOXIL) 875 MG tablet Take 1 tablet (875 mg total) by mouth 2 (two) times daily. (Patient not taking: Reported on 07/06/2015) 20 tablet 0 Not Taking at Unknown time  . [DISCONTINUED] amoxicillin-clavulanate (AUGMENTIN) 875-125 MG tablet Take 1 tablet by mouth 2 (two) times daily. One po bid x 7 days (Patient not taking: Reported on 07/06/2015) 14 tablet 0 Not Taking  at Unknown time  . [DISCONTINUED] HYDROcodone-acetaminophen (NORCO) 5-325 MG tablet Take 1 tablet by mouth every 6 (six) hours as needed for severe pain. (Patient not taking: Reported on 07/06/2015) 15 tablet 0 Not Taking at Unknown time  . [DISCONTINUED] traMADol (ULTRAM) 50 MG tablet Take 100-150 mg by mouth every 4 (four) hours as needed for moderate pain.    07/06/2015 at Unknown time    Musculoskeletal: Strength & Muscle Tone: within normal limits Gait & Station: normal Patient leans: normal  Psychiatric Specialty Exam: Physical Exam  Review of Systems  Constitutional: Positive for weight loss and malaise/fatigue.  HENT: Negative.   Eyes: Negative.   Respiratory:       5-10 cigarettes  Cardiovascular: Positive for palpitations.  Gastrointestinal: Negative.   Genitourinary: Negative.   Musculoskeletal: Positive for back pain and joint pain.       Hip replacement arthritis bilateral   Skin: Negative.   Neurological: Negative.   Endo/Heme/Allergies: Negative.   Psychiatric/Behavioral: Positive for substance abuse. The patient is nervous/anxious and has insomnia.     Blood pressure 113/81, pulse 79, temperature 98.7 F (37.1 C), temperature source Oral, resp. rate 16, height '5\' 11"'$  (1.803 m), weight 96.163 kg (212 lb).Body mass index is 29.58 kg/(m^2).  General Appearance: Fairly Groomed  Engineer, water::  Fair  Speech:  Clear and Coherent  Volume:  Normal  Mood:  Anxious  Affect:  anxious worried  Thought Process:  Coherent and Goal Directed  Orientation:  Full (Time, Place, and Person)  Thought Content:  symptoms event worries concerns  Suicidal Thoughts:  No  Homicidal Thoughts:  No  Memory:  Immediate;   Fair Recent;   Fair Remote;   Fair  Judgement:  Fair  Insight:  Present and Shallow  Psychomotor Activity:  Restlessness  Concentration:  Fair  Recall:  [place]  Language: Fair  Akathisia:  No  Handed:  Right  AIMS (if indicated):     Assets:   Desire for Improvement Housing Social Support Vocational/Educational  ADL's:  Intact  Cognition: WNL  Sleep:  Number of Hours: 3.5     Treatment Plan Summary: Daily contact with patient to assess and evaluate symptoms and progress in treatment and Medication management Supportive approach/coping skills Alcohol dependence; Ativan detox protocol/work a relapse prevention plan Depression; will decrease the Lexapro to 10 mg daily ( he has not been on Lexapro before and starting on 20 mg might be too much. Will consider Wellbutrin given the nature of the depression with lack of energy and motivation decreased libido (will check testosterone) Work with CBT/mindfulness Explore residential treatment options Observation Level/Precautions:  15 minute checks  Laboratory:  As per the ED  Psychotherapy:  Individual/group  Medications:  Ativan detox protocol/start Lexapro  Consultations:    Discharge Concerns:  Need for a residential treatment program  Estimated LOS: 3-5 days  Other:     I certify that inpatient services furnished can reasonably be expected to improve the patient's condition.    Nicholaus Bloom, MD 2/20/20171:15 PM

## 2015-07-07 NOTE — BHH Suicide Risk Assessment (Signed)
[facility] Admission Suicide Risk Assessment   Nursing information obtained from:  Patient Demographic factors:  Male, Caucasian, Access to firearms Current Mental Status:  NA Loss Factors:  Decline in physical health Historical Factors:  Family history of mental illness or substance abuse Risk Reduction Factors:  Employed  Total Time spent with patient: 45 minutes Principal Problem: MDD (major depressive disorder), recurrent severe, without psychosis (HCC) Diagnosis:   Patient Active Problem List   Diagnosis Date Noted  . MDD (major depressive disorder), recurrent severe, without psychosis (HCC) [F33.2] 07/07/2015  . Alcohol use disorder, severe, dependence (HCC) [F10.20] 07/07/2015  . Obese [E66.9] 08/28/2014  . S/P left THA, AA [Z96.60] 08/27/2014   Subjective Data: see admission H and P  Continued Clinical Symptoms:  Alcohol Use Disorder Identification Test Final Score (AUDIT): 34 The "Alcohol Use Disorders Identification Test", Guidelines for Use in Primary Care, Second Edition.  World Science writer [facility]). Score between 0-7:  no or low risk or alcohol related problems. Score between 8-15:  moderate risk of alcohol related problems. Score between 16-19:  high risk of alcohol related problems. Score 20 or above:  warrants further diagnostic evaluation for alcohol dependence and treatment.   CLINICAL FACTORS:   Depression:   Comorbid alcohol abuse/dependence Alcohol/Substance Abuse/Dependencies   Psychiatric Specialty Exam: ROS  Blood pressure 112/75, pulse 79, temperature 98.7 F (37.1 C), temperature source Oral, resp. rate 16, height  (1.803 m), weight 96.163 kg (212 lb).Body mass index is 29.58 kg/(m^2).   COGNITIVE FEATURES THAT CONTRIBUTE TO RISK:  Closed-mindedness, Polarized thinking and Thought constriction (tunnel vision)    SUICIDE RISK:   Moderate:  Frequent suicidal ideation with limited intensity, and duration, some specificity in terms of plans, no  associated intent, good self-control, limited dysphoria/symptomatology, some risk factors present, and identifiable protective factors, including available and accessible social support.  PLAN OF CARE: see admission H and P  I certify that inpatient services furnished can reasonably be expected to improve the patient's condition.   Rachael Fee, MD 07/07/2015, 7:29 PM

## 2015-07-07 NOTE — BHH Group Notes (Signed)
[facility] LCSW Aftercare Discharge Planning Group Note   07/07/2015 11:39 AM  Participation Quality:  Appropriate   Mood/Affect:  Appropriate  Depression Rating:  5  Anxiety Rating:  7  Thoughts of Suicide:  No Will you contract for safety?   NA  Current AVH:  No  Plan for Discharge/Comments:  Pt reports that he is hoping to get into [facility] from here. Reports no withdrawals today. Pleasant mood/good sleep.   Transportation Means: wife?  Supports: Naval architect, Conservation officer, nature

## 2015-07-08 MED ORDER — ESCITALOPRAM OXALATE 10 MG PO TABS: 10.0000 mg | ORAL_TABLET | Freq: Every day | ORAL | Status: DC

## 2015-07-08 MED ORDER — TRAZODONE HCL 100 MG PO TABS
100.0000 mg | ORAL_TABLET | Freq: Every evening | ORAL | Status: DC | PRN
Start: 2015-07-08 — End: 2015-11-19

## 2015-07-08 MED ORDER — NAPROXEN 500 MG PO TABS: 500.0000 mg | ORAL_TABLET | Freq: Two times a day (BID) | ORAL | Status: DC | PRN

## 2015-07-08 MED ORDER — ADULT MULTIVITAMIN W/MINERALS CH: 1.0000 | ORAL_TABLET | Freq: Every morning | ORAL | Status: DC

## 2015-07-08 NOTE — Discharge Summary (Signed)
Physician Discharge Summary Note  Patient:  Howard [name] is an 50 y.o., male MRN:  [id number] DOB:  1965/11/17 Patient phone:  [phone] (home)  Patient address:   [address] [place] [address],  Total Time spent with patient: Greater than 30 minutes  Date of Admission:  07/07/2015 Date of Discharge: 07-08-15  Reason for Admission: Alcohol detoxication  Principal Problem: MDD (major depressive disorder), recurrent severe, without psychosis [facility])  Discharge Diagnoses: Patient Active Problem List   Diagnosis Date Noted  . MDD (major depressive disorder), recurrent severe, without psychosis (HCC) [F33.2] 07/07/2015  . Alcohol use disorder, severe, dependence (HCC) [F10.20] 07/07/2015  . Obese [E66.9] 08/28/2014  . S/P left THA, AA [Z96.60] 08/27/2014   Past Psychiatric History: Major depression, Alcoholism  Past Medical History:  Past Medical History  Diagnosis Date  . Psoriasis   . Osteoarthritis   . Anxiety   . Chronic back pain   . Chronic pain   . Alcohol abuse     Past Surgical History  Procedure Laterality Date  . Total hip arthroplasty    . Appendectomy    . Total hip arthroplasty Left 08/27/2014    Procedure: LEFT TOTAL HIP ARTHROPLASTY ANTERIOR APPROACH;  Surgeon: Durene Romans, MD;  Location: [facility];  Service: Orthopedics;  Laterality: Left;   Family History: History reviewed. No pertinent family history.  Family Psychiatric  History: See H&P  Social History:  History  Alcohol Use  . 8.4 oz/week  . 14 Cans of beer per week     History  Drug Use No    Social History   Social History  . Marital Status: Married    Spouse Name: N/A  . Number of Children: N/A  . Years of Education: N/A   Social History Main Topics  . Smoking status: Current Every Day Smoker -- 0.50 packs/day for 30 years    Types: Cigarettes  . Smokeless tobacco: Never Used  . Alcohol Use: 8.4 oz/week    14 Cans of beer per week  . Drug Use: No  . Sexual Activity:  Not Asked   Other Topics Concern  . None   Social History Narrative   Hospital Course: 50 Y/O male who endorses that in the past 10 years he has been drinking too much. Started when he was 20. States he will drink every day 14-15 beers and wine on weekends. 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 States that his drinking could affect the relationship with his wife of 30 years 24 years married. Has no children. States she also drinks but not like he does. He can drink a one or two and put them down. He is not able to do that. Endorses a lot of anxiety. States his PCP was prescribing Xanax 0.5 mg TID and decreased it to 0.25 mg TID. Finds that he increased his alcohol intake to compensate for the decrease in Xanax.  Howard [name] was admitted to the hospital for alcohol detoxification treatment. His blood alcohol level upon admission was 191 per toxicology tests reports. He was intoxicated. Howard [name]'s recent lab reports  indicated elevated liver enzymes (AST), possible from chronic alcoholism. As a result, not a candidate for Librium detox protocols. This is because, Librium is a long acting Benzodiazepine with a long half-life. If used for this particular detox treatment will impose heavily on already compromised liver enzymes. By using Howard [name] received a cleaner detox treatment without the lingering adverse effects of the Librium capsules in his  system.  Besides the detox treatment, Howard [name] was medicated and discharged on; Lexapro 10 mg for depression & Trazodone 100 mg for insomnia. He presented no other significant health issues that required treatment. He tolerated his treatment regimen without any significant adverse effects & or reactions reported. Howard [name] participated in the AA/NA meetings & group counseling sessions being offered & held on this unit. He learned coping skills.  Howard [name] has completed detox treatment & his mood stable. [facility] is [facility]  further substance abuse treatment. He has been given all the necessary information needed to make this appointment without problems. Upon discharge, he denies any SIHI, AVH, Howard [name] thoughts, paranoia and or substance withdrawal symptoms. He is provided with a 14 days worth, supply samples of his [facility] discharge medications. Howard [name] left [facility] with all personal belongings in no distress. Transportation per friend.   Consults:  psychiatry  Physical Findings: AIMS: Facial and Oral Movements Muscles of Facial Expression: None, normal Lips and Perioral Area: None, normal Jaw: None, normal Tongue: None, normal,Extremity Movements Upper (arms, wrists, hands, fingers): None, normal Lower (legs, knees, ankles, toes): None, normal, Trunk Movements Neck, shoulders, hips: None, normal, Overall Severity Severity of abnormal movements (highest score from questions above): None, normal Incapacitation due to abnormal movements: None, normal Patient's awareness of abnormal movements (rate only patient's report): No Awareness, Dental Status Current problems with teeth and/or dentures?: No Does patient usually wear dentures?: No  CIWA:  CIWA-Ar Total: 2 COWS:     Musculoskeletal: Strength & Muscle Tone: within normal limits Gait & Station: normal Patient leans: N/A  Psychiatric Specialty Exam: Review of Systems  Constitutional: Negative.   HENT: Negative.   Eyes: Negative.   Respiratory: Negative.   Cardiovascular: Negative.   Gastrointestinal: Negative.   Genitourinary: Negative.   Musculoskeletal: Negative.   Skin: Negative.   Neurological: Negative.   Endo/Heme/Allergies: Negative.   Psychiatric/Behavioral: Positive for depression (Stable) and substance abuse. Negative for suicidal ideas (Alcohol use disorder), hallucinations and memory loss. The patient has insomnia (Stable). The patient is not nervous/anxious.     Blood pressure 133/84, pulse 75, temperature 98 F (36.7 C), temperature  source Oral, resp. rate 16, height  (1.803 m), weight 96.163 kg (212 lb).Body mass index is 29.58 kg/(m^2).  See Md's SRA  Have you used any form of tobacco in the last 30 days? (Cigarettes, Smokeless Tobacco, Cigars, and/or Pipes): Yes  Has this patient used any form of tobacco in the last 30 days? (Cigarettes, Smokeless Tobacco, Cigars, and/or Pipes): N/A  Blood Alcohol level:  Lab Results  Component Value Date   ETH 191* 07/06/2015    Metabolic Disorder Labs:  No results found for: HGBA1C, MPG No results found for: PROLACTIN Lab Results  Component Value Date   CHOL 186 03/27/2015   TRIG 92 03/27/2015   HDL 61 03/27/2015   CHOLHDL 3.0 03/27/2015   VLDL 18 03/27/2015   LDLCALC 107 03/27/2015   See Psychiatric Specialty Exam and Suicide Risk Assessment completed by Attending Physician prior to discharge.  Discharge destination:  Other:  [facility]  Is patient on multiple antipsychotic therapies at discharge:  No   Has Patient had three or more failed trials of antipsychotic monotherapy by history:  No  Recommended Plan for Multiple Antipsychotic Therapies: NA    Medication List    STOP taking these medications        ALPRAZolam 0.25 MG tablet  Commonly known as:  Prudy Feeler  HYDROcodone-acetaminophen 10-325 MG tablet  Commonly known as:  NORCO     VISINE OP      TAKE these medications      Indication   escitalopram 10 MG tablet  Commonly known as:  LEXAPRO  Take 1 tablet (10 mg total) by mouth daily. For depression   Indication:  Major Depressive Disorder     multivitamin with minerals Tabs tablet  Take 1 tablet by mouth every morning. For low Vitamin   Indication:  Vitamin Supplement     naproxen 500 MG tablet  Commonly known as:  NAPROSYN  Take 1 tablet (500 mg total) by mouth 2 (two) times daily as needed for mild pain, moderate pain or headache (TAKE WITH MEALS.). For pain   Indication:  Pain     traZODone 100 MG tablet  Commonly known as:   DESYREL  Take 1 tablet (100 mg total) by mouth at bedtime as needed for sleep.   Indication:  Trouble Sleeping       Follow-up Information    Follow up with [facility] On 07/09/2015.   Why:  You have been accepted for admission on this date. Please arrive no later than 11:00AM (per Chrissie Noa in admissions). Thank you.    Contact information:   [address] [place], [place] [address] Phone: [phone] Fax: [phone]     Follow-up recommendations: Activity:  As tolerated Diet: As recommended by your primary care doctor. Keep all scheduled follow-up appointments as recommended.   Comments: Take all your medications as prescribed by your mental healthcare provider. Report any adverse effects and or reactions from your medicines to your outpatient provider promptly. Patient is instructed and cautioned to not engage in alcohol and or illegal drug use while on prescription medicines. In the event of worsening symptoms, patient is instructed to call the crisis hotline, 911 and or go to the nearest ED for appropriate evaluation and treatment of symptoms. Follow-up with your primary care provider for your other medical issues, concerns and or health care needs.   Signed: Sanjuana Kava, NP, PMHNP, FNP-BC 07/08/2015, 4:58 PM I personally assessed the patient and formulated the plan Madie Reno A. Dub Mikes, M.D.

## 2015-07-08 NOTE — BHH Suicide Risk Assessment (Signed)
[facility] Discharge Suicide Risk Assessment   Principal Problem: MDD (major depressive disorder), recurrent severe, without psychosis (HCC) Discharge Diagnoses:  Patient Active Problem List   Diagnosis Date Noted  . MDD (major depressive disorder), recurrent severe, without psychosis (HCC) [F33.2] 07/07/2015  . Alcohol use disorder, severe, dependence (HCC) [F10.20] 07/07/2015  . Obese [E66.9] 08/28/2014  . S/P left THA, AA [Z96.60] 08/27/2014    Total Time spent with patient: 20 minutes  Musculoskeletal: Strength & Muscle Tone: within normal limits Gait & Station: normal Patient leans: normal  Psychiatric Specialty Exam: Review of Systems  Constitutional: Negative.   HENT: Negative.   Eyes: Negative.   Respiratory: Negative.   Cardiovascular: Negative.   Gastrointestinal: Negative.   Genitourinary: Negative.   Musculoskeletal: Negative.   Skin: Negative.   Neurological: Negative.   Endo/Heme/Allergies: Negative.   Psychiatric/Behavioral: Positive for substance abuse.    Blood pressure 123/81, pulse 72, temperature 98 F (36.7 C), temperature source Oral, resp. rate 16, height  (1.803 m), weight 96.163 kg (212 lb).Body mass index is 29.58 kg/(m^2).  General Appearance: Fairly Groomed  Patent attorney::  Fair  Speech:  Clear and Coherent409  Volume:  Normal  Mood:  Anxious  Affect:  Appropriate  Thought Process:  Coherent and Goal Directed  Orientation:  Full (Time, Place, and Person)  Thought Content:  symptoms events worries concerns  Suicidal Thoughts:  No  Homicidal Thoughts:  No  Memory:  Immediate;   Fair Recent;   Fair Remote;   Fair  Judgement:  Fair  Insight:  Present  Psychomotor Activity:  Normal  Concentration:  Fair  Recall:  [employer] of Knowledge:Fair  Language: Fair  Akathisia:  No  Handed:  Right  AIMS (if indicated):     Assets:  Desire for Improvement Housing Social Support Vocational/Educational  Sleep:  Number of Hours: 5.75   Cognition: WNL  ADL's:  Intact  In full contact with reality. No active S/S of withdrawal. No active SI plans or intent. He is willing and motivated to pursue residential treatment at [facility].  Mental Status Per Nursing Assessment::   On Admission:  NA  Demographic Factors:  Male and Caucasian  Loss Factors: None identified  Historical Factors: none identified  Risk Reduction Factors:   Sense of responsibility to family, Employed, Living with another person, especially a relative and Positive social support  Continued Clinical Symptoms:  Depression:   Comorbid alcohol abuse/dependence Alcohol/Substance Abuse/Dependencies  Cognitive Features That Contribute To Risk:  None    Suicide Risk:  Minimal: No identifiable suicidal ideation.  Patients presenting with no risk factors but with morbid ruminations; may be classified as minimal risk based on the severity of the depressive symptoms  Follow-up Information    Follow up with [facility] On 07/09/2015.   Why:  You have been accepted for admission on this date. Please arrive no later than 11:00AM (per Chrissie Noa in admissions). Thank you.    Contact information:   [address] [place], [place] [address] Phone: [phone] Fax: [phone]      Plan Of Care/Follow-up recommendations:  Activity:  as tolerated Diet:  regular Follow up [facility] as above Rachael Fee, MD 07/08/2015, 6:44 PM

## 2015-07-08 NOTE — BHH Counselor (Signed)
Adult Comprehensive Assessment  Patient ID: Howard [name], male   DOB: 08/17/1965, 50 y.o.   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 consistant use daily beer and wine Bereavement / Loss: none identified   Living/Environment/Situation:  Living Arrangements: Spouse/significant other Living conditions (as described by patient or guardian): lives with wife at home How long has patient lived in current situation?: 24 years  What is atmosphere in current home: Comfortable  Family History:  Marital status: Married Number of Years Married: 24 What types of issues is patient dealing with in the relationship?: pt reports great relationship other than issues relating to his drinking Additional relationship information: n/a  Are you sexually active?: Yes What is your sexual orientation?: heterosexual  Has your sexual activity been affected by drugs, alcohol, medication, or emotional stress?: n/a  Does patient have children?: No  Childhood History:  By whom was/is the patient raised?: Both parents Additional childhood history information: "I had an awesome childhood." Pefect family Description of patient's relationship with caregiver when they were a child: close to both parents Patient's description of current relationship with people who raised him/her: close to both parents  How were you disciplined when you got in trouble as a  child/adolescent?: they talked to me.  Does patient have siblings?: Yes Number of Siblings: 5 Description of patient's current relationship with siblings: 4 siblings and one half sibling. one brother and one sister struggle with alcoholism "but not as bad as me."  Did patient suffer any verbal/emotional/physical/sexual abuse as a child?: No Did patient suffer from severe childhood neglect?: No Has patient ever been sexually abused/assaulted/raped as an adolescent or adult?: No Was the patient ever a victim of a crime or a disaster?: No Witnessed domestic violence?: No Has patient been effected by domestic violence as an adult?: No  Education:  Highest grade of school patient has completed: 12th grade  Currently a student?: No Name of school: n/a  Learning disability?: No  Employment/Work Situation:   Employment situation: Employed 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  Has patient ever been in the [employer]?: No Has patient ever served in combat?: No Did You Receive Any Psychiatric Treatment/Services While in the [employer]?: No Are There Guns or Other Weapons in Your Home?: No Are These Weapons Safely Secured?: No Who Could Verify You Are Able To Have These Secured:: n/a   Financial Resources:   Financial resources: Income from employment, Private insurance Does patient have a representative payee or guardian?: No  Alcohol/Substance Abuse:   What has been your use of drugs/alcohol within the last 12 months?: alcohol daily (since age 66).  If attempted suicide, did drugs/alcohol play a role in this?: No Alcohol/Substance Abuse Treatment  Hx: Denies past  history Has alcohol/substance abuse ever caused legal problems?: No  Social Support System:   Patient's Community Support System: Good Describe Community Support System: great support from family and coworkers/friends in community Type of faith/religion: Ephriam Knuckles How does patient's faith help to cope with current illness?: church; Occupational psychologist:   Leisure and Hobbies: fishing, golf, yard work   Strengths/Needs:   What things does the patient do well?: work hard; try to be a good husband "and a good Christian." In what areas does patient struggle / problems for patient: alcohol abuse; addiction and cravings/poor coping skills at times.   Discharge Plan:   Does patient have access to transportation?: Yes (car and license) Will patient be returning to same living situation after discharge?: No Plan for living situation after discharge: pt is hoping to be admitted directly to [facility]  Currently receiving community mental health services: No If no, would patient like referral for services when discharged?: Yes (What county?) Medical sales representative) Does patient have financial barriers related to discharge medications?: No  Summary/Recommendations:   Summary and Recommendations (to be completed by the evaluator): Patient is 50 year old male living in [place], [place] He presents to the Hospital seeking treatment for depression, alcohol abuse, and for medication stabilization. Patient denies SI/Hi/AVH. Patient is hoping to get into [facility] for inpatient treatment at discharge. Recommendations for patient include: crisis stabilization, therapeutic milieu, encourage group attendance and participation, and development of comprehensive mental wellness/sobriety plan.   Trula Slade LCSW 07/08/2015 2:39 PM

## 2015-07-08 NOTE — Progress Notes (Signed)
Recreation Therapy Notes  Animal-Assisted Activity (AAA) Program Checklist/Progress Notes Patient Eligibility Criteria Checklist & Daily Group note for Rec Tx Intervention  Date: 02.21.2017 Time: 2:45pm Location: [address]   AAA/T Program Assumption of Risk Form signed by Patient/ or Parent Legal Guardian yes  Patient is free of allergies or sever asthma yes  Patient reports no fear of animals yes  Patient reports no history of cruelty to animals yes  Patient understands his/her participation is voluntary yes  Behavioral Response: Did not attend.   Marykay Lex Fiora Weill, LRT/CTRS  Lyman Balingit L 07/08/2015 3:11 PM

## 2015-07-08 NOTE — Progress Notes (Signed)
  [facility] Adult Case Management Discharge Plan :  Will you be returning to the same living situation after discharge:  No, pt accepted to [facility] for [facility] 2/22. At discharge, do you have transportation home?: Yes,  pt's friend will pick him up at 9:30AM on WED, 2/22 Do you have the ability to pay for your medications: Yes,  [employer] private insurance.   Release of information consent forms completed and submitted to medical records by CSW.  Patient to Follow up at: Follow-up Information    Follow up with [facility] On 07/09/2015.   Why:  You have been accepted for admission on this date. Please arrive no later than 11:00AM (per Chrissie Noa in admissions). Thank you.    Contact information:   [address] [place], [place] [address] Phone: [phone] Fax: [phone]      Next level of care provider has access to [facility] Link:no  Safety Planning and Suicide Prevention discussed: Yes,  SPE completed with pt's wife. Pt provided with SPI pamphlet and mobile crisis information  Have you used any form of tobacco in the last 30 days? (Cigarettes, Smokeless Tobacco, Cigars, and/or Pipes): Yes  Has patient been referred to the Quitline?: Patient refused referral  Patient has been referred for addiction treatment: Yes-see above.   Smart, Jaret Coppedge LCSW 07/08/2015, 3:42 PM

## 2015-07-08 NOTE — Progress Notes (Signed)
Pt was in his room in bed most of the shift.  He did not attend evening group.  He told Clinical research associate that he had not slept much in the last few weeks, and being able to sleep for a little while was a relief.  At the beginning of the shift when writer did the assessment, pt reported that he lay down after dinner and went to sleep.  He said that it was restful sleep.  Pt denies SI/HI/AVH.  He denies having any withdrawal symptoms at this time.  He said the medications were helping him.  Writer and pt discussed his hs meds.  He was encouraged to make his needs known to staff.  Pt voiced no needs or concerns at that time.  Pt would like to go for long term treatment after detox as he feels he will need longer than a few days to make a difference in his life situation.  Support and encouragement offered.  Discharge plans are in process.  Safety maintained with q15 minute checks.

## 2015-07-08 NOTE — Progress Notes (Signed)
Adult Psychoeducational Group Note  Date:  07/08/2015 Time:  8:20 PM  Group Topic/Focus:  Wrap-Up Group:   The focus of this group is to help patients review their daily goal of treatment and discuss progress on daily workbooks.  Participation Level:  Active  Participation Quality:  Appropriate  Affect:  Appropriate  Cognitive:  Appropriate  Insight: Appropriate  Engagement in Group:  Engaged  Modes of Intervention:  Discussion  Additional Comments:  Pt was pleasant during wrap-up group. Pt rated his overall day a 7 out of 10. Pt noted that he had a good day because he was asked for advice by other patients on the unit and it made him feel good.   Cleotilde Neer 07/08/2015, 9:11 PM

## 2015-07-08 NOTE — Progress Notes (Signed)
NUTRITION ASSESSMENT  Pt identified as at risk on the Malnutrition Screen Tool  INTERVENTION: 1. Supplements: Ensure Enlive po BID, each supplement provides 350 kcal and 20 grams of protein  NUTRITION DIAGNOSIS: Unintentional weight loss related to sub-optimal intake as evidenced by pt report.   Goal: Pt to meet >/= 90% of their estimated nutrition needs.  Monitor:  PO intake  Assessment:  Pt admitted with depression, anxiety and ETOH abuse. Per H&P, pt reports drinking 14-15 beers a day with 3-5 glasses of wine on weekends. Per weight history, pt has lost 35 lb since April 2016 (14% wt loss x 10 months, insignificant for time frame). However, due to progressive weight loss patient would likely benefit from nutritional supplementation. RD to order Ensure BID.   Height: Ht Readings from Last 1 Encounters:  07/07/15  (1.803 m)    Weight: Wt Readings from Last 1 Encounters:  07/07/15 212 lb (96.163 kg)    Weight Hx: Wt Readings from Last 10 Encounters:  07/07/15 212 lb (96.163 kg)  07/06/15 208 lb (94.348 kg)  06/04/15 212 lb (96.163 kg)  03/27/15 215 lb (97.523 kg)  12/16/14 223 lb (101.152 kg)  08/27/14 243 lb (110.224 kg)  08/19/14 243 lb (110.224 kg)  03/20/13 230 lb (104.327 kg)    BMI:  Body mass index is 29.58 kg/(m^2). Pt meets criteria for overweight based on current BMI.  Estimated Nutritional Needs: Kcal: 25-30 kcal/kg Protein: > 1 gram protein/kg Fluid: 1 ml/kcal  Diet Order: Diet regular Room service appropriate?: Yes; Fluid consistency:: Thin Pt is also offered choice of unit snacks mid-morning and mid-afternoon.  Pt is eating as desired.   Lab results and medications reviewed.   Tilda Franco, MS, RD, LDN Pager: [phone] After Hours Pager: [phone]

## 2015-07-08 NOTE — Progress Notes (Signed)
Patient up and visible in milieu however interaction is minimal. Patient flat in affect with depressed mood however rates his depression at a 1/10. Denies hopelessness however rates anxiety at a 7/10. Does report headache of a 5/10 and received tylenol prior to start of this writer's shift. States the pain in manageable. Reports his goal is to "accept the condition I am in and work on it." Patient medicated per orders. Emotional support and reassurance offered. Self inventory reviewed. Patient denies SI/HI and remains safe on level III obs. Lawrence Marse[place]

## 2015-07-08 NOTE — Tx Team (Signed)
Interdisciplinary Treatment Plan Update (Adult)  Date:  07/08/2015  Time Reviewed:  3:43 PM   Progress in Treatment: Attending groups: Yes. Participating in groups:  Yes. Taking medication as prescribed:  Yes. Tolerating medication:  Yes. Family/Significant othe contact made:  SPE completed with pt's wife.  Patient understands diagnosis:  Yes. and As evidenced by:  seeking treatment for ETOH abuse, depression, SI, and medication management.  Discussing patient identified problems/goals with staff:  Yes. Medical problems stabilized or resolved:  Yes. Denies suicidal/homicidal ideation: Yes. Issues/concerns per patient self-inventory:  Other:  Discharge Plan or Barriers: Pt has been accepted to [facility] 2/22 and must arrive no later than 11:00AM. Pt's friend will pick him up from [facility] and transport him directly to facility.   Reason for Continuation of Hospitalization: none  Comments:  Howard [name] is a married 50 y.o. Caucasian male who presents unaccompanied to [facility] ED reporting symptoms of depression, anxiety and alcohol use. Pt reports he has experienced symptoms of depression for the past five years and over the past two years his symptoms have been more severe. Pt reports he has experience suicidal ideation for the past two weeks. Pt states that last week he went into the woods with a pistol, put it in his mouth and considered killing himself. Pt states he would not act on suicidal thoughts due to his convictions as a [place] and because he doesn't want to put his wife through such trauma. Pt states that today he decided to tell his wife and friends he needed help. Pt states he used to be enthusiastic about life but now"I wake up every morning and don't want to face the day." Pt reports symptoms including crying spells, social withdrawal, loss of interest in usual pleasures, fatigue, irritability, decreased concentration, decreased sleep, decreased appetite and  feelings of guilt and hopelessness. He reports he has lost forty pounds in the past year. Pt denies any suicide attempts other than putting the gun in his mouth. He denies current homicidal ideation or history of violence. Pt denies any history of psychotic symptoms. Pt reports he drinks alcohol to cope with depression and anxiety. He reports drinking 3-5 beers plus 3-5 glasses of wine after work during the week and 12 beers plus 3-5 glasses of wine on the weekend. Prior to coming to the emergency department he had 9 beers and took 0.5 mg of Xanax. He states he uses marijuana approximately once a month when offered by a friend. He denies other substance abuse. Pt's blood alcohol is 191 and urine drug screen is positive for opiates and benzodiazepines. 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 Pt says that he has no motivation but "I put my clown on" to hide his emotions and goes to work. Pt lives with his wife, who he describes as very supportive. He has no children. Pt states that he has a great job, a loving wife, good friends, is financially secure and doesn't understand why he is depressed. He reports his relationship with his wife has suffered and he has no interest in sex because he has no motivation or interest in anything. Pt reports his father had a history of depression. Pt denies any history of inpatient or outpatient mental health or substance abuse treatment. He states he has never been prescribed an antidepressant. Pt says his primary care physician prescribed Xanax 0.5 mg 2-3 tabs PRN and recently reduced the dosage to 0.25  mg 2-3 tabs PRN. Diagnosis: Major Depressive Disorder, Recurrent, Severe Without Psychotic Features; Alcohol Use Disorder, Severe   Estimated length of stay:  1 day (pt scheduled for early morning d/c on Wed 2/22--9:30AM).   Additional Comments:  Patient and CSW reviewed pt's identified goals and treatment plan. Patient  verbalized understanding and agreed to treatment plan. CSW reviewed [facility] "Discharge Process and Patient Involvement" Form. Pt verbalized understanding of information provided and signed form.    Review of initial/current patient goals per problem list:  1. Goal(s): Patient will participate in aftercare plan  Met: Yes  Target date: at discharge  As evidenced by: Patient will participate within aftercare plan AEB aftercare provider and housing plan at discharge being identified.  2/20: Pt reports that [facility] would [facility]   2/21: Pt accepted to [facility].   2. Goal (s): Patient will exhibit decreased depressive symptoms and suicidal ideations.  Met: Yes    Target date: at discharge  As evidenced by: Patient will utilize self rating of depression at 3 or below and demonstrate decreased signs of depression or be deemed stable for discharge by MD.  2/20: Pt rates depression as 5/10 and presents with pleasant mood/anxious affect. Denies SI/HI/AVH.   2/22: Pt rates depression as 2/10 and presents with pleasant mood/calm affect.   3. Goal(s): Patient will demonstrate decreased signs and symptoms of anxiety.  Met:No.   Target date: at discharge  As evidenced by: Patient will utilize self rating of anxiety at 3 or below and demonstrated decreased signs of anxiety, or be deemed stable for discharge by MD  2/20: Pt rates anxiety as 7/10 and denies SI/HI/AVH.   2/21: Pt rates anxiety as 2/10 and presents with pleasant mood and calm affect.   4. Goal(s): Patient will demonstrate decreased signs of withdrawal due to substance abuse  Met:Yes   Target date:at discharge   As evidenced by: Patient will produce a CIWA/COWS score of 0, have stable vitals signs, and no symptoms of withdrawal.  2/20: Pt reports no withdrawals with CIWA score of 0 and stable vitals.   Attendees: Patient:   07/08/2015 3:43 PM   Family:   07/08/2015 3:43 PM   Physician:   Dr. Carlton Adam, MD 07/08/2015 3:43 PM   Nursing:   Rosie Fate RN 07/08/2015 3:43 PM   Clinical Social Worker: Maxie Better, LCSW 07/08/2015 3:43 PM   Clinical Social Worker: Erasmo Downer Drinkard LCSWA; Peri Maris LCSWA 07/08/2015 3:43 PM    07/08/2015 3:43 PM    07/08/2015 3:43 PM   Other:  Lars Pinks; RN Case Manager  07/08/2015 3:43 PM   Other:  07/08/2015 3:43 PM   Other:  07/08/2015 3:43 PM   Other:  07/08/2015 3:43 PM    07/08/2015 3:43 PM    07/08/2015 3:43 PM    07/08/2015 3:43 PM    07/08/2015 3:43 PM    Scribe for Treatment Team:   Maxie Better, LCSW 07/08/2015 3:43 PM

## 2015-07-08 NOTE — [facility] Group Notes (Signed)
[facility] LCSW Group Therapy  07/08/2015 1:50 PM  Type of Therapy:  Group Therapy  Participation Level:  Active  Participation Quality:  Attentive  Affect:  Appropriate  Cognitive:  Alert and Oriented  Insight:  Improving  Engagement in Therapy:  Improving  Modes of Intervention:  Confrontation, Discussion, Education, Exploration, Problem-solving, Rapport Building, Socialization and Support  Summary of Progress/Problems: MHA Speaker came to talk about his personal journey with substance abuse and addiction. The pt processed ways by which to relate to the speaker. MHA speaker provided handouts and educational information pertaining to groups and services offered by the [facility].   Smart, Howard Ager LCSW 07/08/2015, 1:50 PM

## 2015-07-08 NOTE — [facility] Suicide Risk Assessment (Signed)
[facility] INPATIENT:  Family/Significant Other Suicide Prevention Education  Suicide Prevention Education:  Education Completed; Zanden Colver (pt's wife) [phone] has been identified by the patient as the family member/significant other with whom the patient will be residing, and identified as the person(s) who will aid the patient in the event of a mental health crisis (suicidal ideations/suicide attempt).  With written consent from the patient, the family member/significant other has been provided the following suicide prevention education, prior to the and/or following the discharge of the patient.  The suicide prevention education provided includes the following:  Suicide risk factors  Suicide prevention and interventions  National Suicide Hotline telephone number  [facility] assessment telephone number  [facility] Emergency Assistance 911  [facility] and/or Residential Mobile Crisis Unit telephone number  Request made of family/significant other to:  Remove weapons (e.g., guns, rifles, knives), all items previously/currently identified as safety concern.    Remove drugs/medications (over-the-counter, prescriptions, illicit drugs), all items previously/currently identified as a safety concern.  The family member/significant other verbalizes understanding of the suicide prevention education information provided.  The family member/significant other agrees to remove the items of safety concern listed above.  Smart, Gertrude Bucks LCSW 07/08/2015, 3:41 PM

## 2015-07-08 NOTE — Plan of Care (Signed)
Problem: Ineffective individual coping Goal: STG: Patient will remain free from self harm Outcome: Progressing Patient has not engaged in self harm, denies SI  Problem: Alteration in mood & ability to function due to Goal: STG-Patient will comply with prescribed medication regimen (Patient will comply with prescribed medication regimen)  Outcome: Progressing Patient has been med compliant.     

## 2015-07-08 NOTE — Progress Notes (Signed)
[facility] MD Progress Note  07/08/2015 6:35 PM Howard [name]  MRN:  [id number] Subjective:  Howard [name] continues to be detox. States he really wants to do this. States he is tired of drinking and feeling sick. He states he does not want to lose his wife or jeopardize his job. Can see if he was not to change he will end up losing everything he cares for. He is also concerned about his pain management as he is concerned about the pain triggering relapse. Principal Problem: MDD (major depressive disorder), recurrent severe, without psychosis (HCC) Diagnosis:   Patient Active Problem List   Diagnosis Date Noted  . MDD (major depressive disorder), recurrent severe, without psychosis (HCC) [F33.2] 07/07/2015  . Alcohol use disorder, severe, dependence (HCC) [F10.20] 07/07/2015  . Obese [E66.9] 08/28/2014  . S/P left THA, AA [Z96.60] 08/27/2014   Total Time spent with patient: 20 minutes  Past Psychiatric History: see admission H and P  Past Medical History:  Past Medical History  Diagnosis Date  . Psoriasis   . Osteoarthritis   . Anxiety   . Chronic back pain   . Chronic pain   . Alcohol abuse     Past Surgical History  Procedure Laterality Date  . Total hip arthroplasty    . Appendectomy    . Total hip arthroplasty Left 08/27/2014    Procedure: LEFT TOTAL HIP ARTHROPLASTY ANTERIOR APPROACH;  Surgeon: Durene Romans, MD;  Location: [facility];  Service: Orthopedics;  Laterality: Left;   Family History: History reviewed. No pertinent family history. Family Psychiatric  History: see admission H and P Social History:  History  Alcohol Use  . 8.4 oz/week  . 14 Cans of beer per week     History  Drug Use No    Social History   Social History  . Marital Status: Married    Spouse Name: N/A  . Number of Children: N/A  . Years of Education: N/A   Social History Main Topics  . Smoking status: Current Every Day Smoker -- 0.50 packs/day for 30 years    Types: Cigarettes  . Smokeless tobacco:  Never Used  . Alcohol Use: 8.4 oz/week    14 Cans of beer per week  . Drug Use: No  . Sexual Activity: Not Asked   Other Topics Concern  . None   Social History Narrative   Additional Social History:                         Sleep: Fair  Appetite:  Fair  Current Medications: Current Facility-Administered Medications  Medication Dose Route Frequency Provider Last Rate Last Dose  . acetaminophen (TYLENOL) tablet 650 mg  650 mg Oral Q6H PRN Worthy Flank, NP   650 mg at 07/08/15 0629  . alum & mag hydroxide-simeth (MAALOX/MYLANTA) 200-200-20 MG/5ML suspension 30 mL  30 mL Oral Q4H PRN Worthy Flank, NP      . escitalopram (LEXAPRO) tablet 10 mg  10 mg Oral Daily Rachael Fee, MD   10 mg at 07/08/15 0804  . hydrOXYzine (ATARAX/VISTARIL) tablet 25 mg  25 mg Oral Q6H PRN Worthy Flank, NP      . loperamide (IMODIUM) capsule 2-4 mg  2-4 mg Oral PRN Worthy Flank, NP      . LORazepam (ATIVAN) tablet 1 mg  1 mg Oral Q6H PRN Worthy Flank, NP      . LORazepam (ATIVAN) tablet 1 mg  1 mg Oral TID Worthy Flank, NP   1 mg at 07/08/15 1652   Followed by  . [START ON 07/09/2015] LORazepam (ATIVAN) tablet 1 mg  1 mg Oral BID Worthy Flank, NP       Followed by  . [START ON 07/11/2015] LORazepam (ATIVAN) tablet 1 mg  1 mg Oral Daily Worthy Flank, NP      . magnesium hydroxide (MILK OF MAGNESIA) suspension 30 mL  30 mL Oral Daily PRN Worthy Flank, NP      . multivitamin with minerals tablet 1 tablet  1 tablet Oral Daily Worthy Flank, NP   1 tablet at 07/08/15 0805  . naproxen (NAPROSYN) tablet 500 mg  500 mg Oral BID PRN Worthy Flank, NP   500 mg at 07/08/15 1651  . ondansetron (ZOFRAN-ODT) disintegrating tablet 4 mg  4 mg Oral Q6H PRN Worthy Flank, NP      . thiamine (B-1) injection 100 mg  100 mg Intramuscular Once Worthy Flank, NP   100 mg at 07/07/15 0230  . thiamine (VITAMIN B-1) tablet 100 mg  100 mg Oral Daily Worthy Flank, NP   100 mg at 07/08/15  0804  . traZODone (DESYREL) tablet 100 mg  100 mg Oral QHS PRN Worthy Flank, NP   100 mg at 07/07/15 2132    Lab Results:  Results for orders placed or performed during the hospital encounter of 07/06/15 (from the past 48 hour(s))  Urinalysis, Routine w reflex microscopic (not at [facility])     Status: None   Collection Time: 07/06/15  6:49 PM  Result Value Ref Range   Color, Urine YELLOW YELLOW   APPearance CLEAR CLEAR   Specific Gravity, Urine 1.014 1.005 - 1.030   pH 5.0 5.0 - 8.0   Glucose, UA NEGATIVE NEGATIVE mg/dL   Hgb urine dipstick NEGATIVE NEGATIVE   Bilirubin Urine NEGATIVE NEGATIVE   Ketones, ur NEGATIVE NEGATIVE mg/dL   Protein, ur NEGATIVE NEGATIVE mg/dL   Nitrite NEGATIVE NEGATIVE   Leukocytes, UA NEGATIVE NEGATIVE    Comment: MICROSCOPIC NOT DONE ON URINES WITH NEGATIVE PROTEIN, BLOOD, LEUKOCYTES, NITRITE, OR GLUCOSE <1000 mg/dL.    Blood Alcohol level:  Lab Results  Component Value Date   [facility] 191* 07/06/2015    Physical Findings: AIMS: Facial and Oral Movements Muscles of Facial Expression: None, normal Lips and Perioral Area: None, normal Jaw: None, normal Tongue: None, normal,Extremity Movements Upper (arms, wrists, hands, fingers): None, normal Lower (legs, knees, ankles, toes): None, normal, Trunk Movements Neck, shoulders, hips: None, normal, Overall Severity Severity of abnormal movements (highest score from questions above): None, normal Incapacitation due to abnormal movements: None, normal Patient's awareness of abnormal movements (rate only patient's report): No Awareness, Dental Status Current problems with teeth and/or dentures?: No Does patient usually wear dentures?: No  CIWA:  CIWA-Ar Total: 2 COWS:     Musculoskeletal: Strength & Muscle Tone: within normal limits Gait & Station: normal Patient leans: normal  Psychiatric Specialty Exam: Review of Systems  Constitutional: Negative.   HENT: Negative.   Eyes: Negative.   Respiratory:  Negative.   Cardiovascular: Negative.   Gastrointestinal: Negative.   Genitourinary: Negative.   Musculoskeletal: Negative.   Skin: Negative.   Neurological: Negative.   Endo/Heme/Allergies: Negative.   Psychiatric/Behavioral: Positive for depression and substance abuse. The patient is nervous/anxious.     Blood pressure 123/81, pulse 72, temperature 98 F (36.7 C), temperature source Oral, resp. rate  16, height  (1.803 m), weight 96.163 kg (212 lb).Body mass index is 29.58 kg/(m^2).  General Appearance: Fairly Groomed  Patent attorney::  Fair  Speech:  Clear and Coherent  Volume:  Normal  Mood:  Anxious and worried  Affect:  anxious worried  Thought Process:  Coherent and Goal Directed  Orientation:  Full (Time, Place, and Person)  Thought Content:  symptoms events worries concerns  Suicidal Thoughts:  No  Homicidal Thoughts:  No  Memory:  Immediate;   Fair Recent;   Fair Remote;   Fair  Judgement:  Fair  Insight:  Present  Psychomotor Activity:  Normal  Concentration:  Fair  Recall:  [employer] of Knowledge:Fair  Language: Fair  Akathisia:  No  Handed:  Right  AIMS (if indicated):     Assets:  Desire for Improvement Housing Talents/Skills Vocational/Educational  ADL's:  Intact  Cognition: WNL  Sleep:  Number of Hours: 5.75   Treatment Plan Summary: Daily contact with patient to assess and evaluate symptoms and progress in treatment and Medication management Supportive approach/coping skills Alcohol dependence; continue the Ativan detox protocol/work a relapse prevention plan Depression; continue the Lexapro 10 mg daily Pain; he will ask the MD at [facility] for suggestions as far as pain management with agents that would not be addictive. We talked about Cymbalta Neurontin but he would rather do it from that setting Facilitate admission to [facility] in the AM Kiet Geer A, MD 07/08/2015, 6:35 PM

## 2015-07-09 NOTE — Progress Notes (Signed)
D    Pt is pleasant on approach and has appropriate interaction with staff and peers   He reports very little discomfort from withdrawal and requested medication to help him sleep   He requested the same medication he had the night before because he slept so good A    Verbal support given    Medications administered and effectiveness monitored    Q 15 min checks R    Pt safe at present

## 2015-11-10 ENCOUNTER — Other Ambulatory Visit: Payer: Self-pay | Admitting: Specialist

## 2015-11-10 DIAGNOSIS — M4807 Spinal stenosis, lumbosacral region: Secondary | ICD-10-CM

## 2015-11-19 ENCOUNTER — Ambulatory Visit
Admission: RE | Admit: 2015-11-19 | Discharge: 2015-11-19 | Disposition: A | Discharge status: Home / Self Care | Payer: Managed Care, Other (non HMO) | Source: Ambulatory Visit | Attending: Specialist | Admitting: Specialist

## 2015-11-19 DIAGNOSIS — M4807 Spinal stenosis, lumbosacral region: Secondary | ICD-10-CM

## 2015-11-19 MED ORDER — ONDANSETRON HCL 4 MG/2ML IJ SOLN
4.0000 mg | Freq: Once | INTRAMUSCULAR | Status: AC
  Administered 2015-11-19: 4 mg via INTRAMUSCULAR

## 2015-11-19 MED ORDER — DIAZEPAM 5 MG PO TABS
10.0000 mg | ORAL_TABLET | Freq: Once | ORAL | Status: AC
  Administered 2015-11-19: 10 mg via ORAL

## 2015-11-19 MED ORDER — MEPERIDINE HCL 100 MG/ML IJ SOLN
100.0000 mg | Freq: Once | INTRAMUSCULAR | Status: AC
  Administered 2015-11-19: 100 mg via INTRAMUSCULAR

## 2015-11-19 MED ORDER — IOPAMIDOL (ISOVUE-M 200) INJECTION 41%
15.0000 mL | Freq: Once | INTRAMUSCULAR | Status: AC
  Administered 2015-11-19: 15 mL via INTRATHECAL

## 2015-11-19 NOTE — Progress Notes (Signed)
Patient states he has been off Cymbalta for at least the past two days.  Izen Petz, RN 

## 2015-11-19 NOTE — Discharge Instructions (Signed)
[facility] Discharge Instructions  1. Go home and rest quietly for the next 24 hours.  It is important to lie flat for the next 24 hours.  Get up only to go to the restroom.  You may lie in the bed or on a couch on your back, your stomach, your left side or your right side.  You may have one pillow under your head.  You may have pillows between your knees while you are on your side or under your knees while you are on your back.  2. DO NOT drive today.  Recline the seat as far back as it will go, while still wearing your seat belt, on the way home.  3. You may get up to go to the bathroom as needed.  You may sit up for 10 minutes to eat.  You may resume your normal diet and medications unless otherwise indicated.  Drink plenty of extra fluids today and tomorrow.  4. The incidence of a spinal headache with nausea and/or vomiting is about 5% (one in 20 patients).  If you develop a headache, lie flat and drink plenty of fluids until the headache goes away.  Caffeinated beverages may be helpful.  If you develop severe nausea and vomiting or a headache that does not go away with flat bed rest, call [phone].  5. You may resume normal activities after your 24 hours of bed rest is over; however, do not exert yourself strongly or do any heavy lifting tomorrow.  6. Call your physician for a follow-up appointment.   You may resume Cymbalta on Thursday morning.

## 2015-11-28 ENCOUNTER — Ambulatory Visit: Payer: Self-pay | Admitting: Orthopedic Surgery

## 2015-12-08 ENCOUNTER — Ambulatory Visit: Payer: Self-pay | Admitting: Orthopedic Surgery

## 2015-12-08 NOTE — H&P (Signed)
Howard CandelaJohn R [name] is an 50 y.o. male.   Chief Complaint: back and L leg pain HPI: The patient is a [age] year old male who presents today for follow up of their back. The patient is being followed for their back pain. They are now year(s) out from when symptoms began. Symptoms reported today include: pain (low back) and leg pain (left). [name] treatment includes: NSAIDs (Ibuprofen) and pain medications. The following medication has been used for pain control: Norco (10/325mg ). The patient reports their [name] pain level to be 8 / 10. The patient presents today following CT/Myelogram.  Howard RuizJohn [name] follows up. He is still having pain, worse with activity, better with rest. His MRI demonstrates bilateral left greater than right L5 nerve root impingement, dynamic instability with worsening of the stenosis in standing. There is no listhesis. He reports no back pain, mainly leg pain. Here with his wife. He has reported taking the Vicodin 10 every four to six hours. It does not seem to be working.  Past Medical History:  Diagnosis Date  . Alcohol abuse   . Anxiety   . Chronic back pain   . Chronic pain   . Osteoarthritis   . Psoriasis     Past Surgical History:  Procedure Laterality Date  . APPENDECTOMY    . TOTAL HIP ARTHROPLASTY    . TOTAL HIP ARTHROPLASTY Left 08/27/2014   Procedure: LEFT TOTAL HIP ARTHROPLASTY ANTERIOR APPROACH;  Surgeon: Durene RomansMatthew Olin, MD;  Location: [facility];  Service: Orthopedics;  Laterality: Left;    No family history on file. Social History:  reports that he has been smoking Cigarettes.  He has a 15.00 pack-year smoking history. He has never used smokeless tobacco. He reports that he drinks about 8.4 oz of alcohol per week . He reports that he does not use drugs.  Allergies: No Known Allergies   (Not in a hospital admission)  No results found for this or any previous visit (from the past 48 hour(s)). No results found.  Review of Systems  Constitutional: Negative.    HENT: Negative.   Eyes: Negative.   Respiratory: Negative.   Cardiovascular: Negative.   Gastrointestinal: Negative.   Genitourinary: Negative.   Musculoskeletal: Positive for back pain.  Skin: Negative.   Neurological: Positive for sensory change and focal weakness.  Psychiatric/Behavioral: Negative.     There were no vitals taken for this visit. Physical Exam  Constitutional: He is oriented to person, place, and time. He appears well-developed. He appears distressed.  HENT:  Head: Normocephalic.  Eyes: Pupils are equal, round, and reactive to light.  Neck: Normal range of motion.  Cardiovascular: Normal rate.   Respiratory: Effort normal.  GI: Soft.  Musculoskeletal:  Straight leg raise with buttock, thigh and calf pain on the left, negative on the right. EHLs in dorsiflexion is 4+/5. Pain with extension, relieved with forward flexion. Lumbar spine exam reveals no evidence of soft tissue swelling, deformity or skin ecchymosis. On palpation there is no tenderness of the lumbar spine. No flank pain with percussion. The abdomen is soft and nontender. Nontender over the trochanters. No cellulitis or lymphadenopathy.  Motor is 5/5 including tibialis anterior, plantar flexion, quadriceps and hamstrings. Patient is normoreflexic. There is no Babinski or clonus. Sensory exam is intact to light touch. Patient has good distal pulses. No DVT. No pain and normal range of motion without instability of the hips, knees and ankles.  He is starting to get some pain into the right.  Neurological: He  is alert and oriented to person, place, and time.  Skin: Skin is warm and dry.  Psychiatric: He has a normal mood and affect.    X-rays reviewed. No fractures, subluxation, dislocation, lytic or blastic lesions. He is status post bilateral total hip replacements which are unremarkable. He has a scoliosis which is concave to the left in the lower lumbar spine causing some increased narrowing to the left  side at L4-5. Multilevel disc degeneration. There is a trace listhesis at L3-4 which slips slightly in flexion.   MRI images and report reviewed by Dr. Shelle Iron. Multilevel disc degeneration and disc desiccation. He has multifactorial stenosis at L4-5 to the left, lateral recess stenosis due to ligamentum flavum, hypertrophy, disc extrusion, also some foraminal stenosis, foraminal disc extrusion. This is likely underestimated due to his position on the MRI. At L5-S1, there is a smaller disc protrusion slightly more towards the left also causing some foraminal stenosis, but less lateral recess stenosis.  Myelogram demonstrate bilateral left greater than right L5 nerve root impingement, dynamic instability with worsening of the stenosis in standing. There is no listhesis.  Assessment/Plan 1. Symptomatic spinal stenosis with L5 nerve root impingement bilaterally. 2. Minimally symptomatic multilevel disc degeneration.  We discussed options in extensive detail. He reports he is no longer able to tolerate. We therefore discussed lumbar decompression bilaterally at L4-5. I had an extensive discussion of the risks and benefits of the lumbar decompression with the patient including bleeding, infection, damage to neurovascular structures, epidural fibrosis, CSF leak requiring repair. We also discussed increase in pain, adjacent segment disease, recurrent disc herniation, need for future surgery including repeat decompression and/or fusion. We also discussed risks of postoperative hematoma, paralysis, anesthetic complications including DVT, PE, death, cardiopulmonary dysfunction. In addition, the perioperative and postoperative courses were discussed in detail including the rehabilitative time and return to functional activity and work. I provided the patient with an illustrated handout and utilized the appropriate surgical models.  No particular risks in terms of previous history of MRSA. No DVT, PE. In terms of his  medication, he says it has not been helping him. I gave him a prescription for Percocet 5/325 to be taken q.i.d. p.r.n. He is unsure whether he has a pain contract with Dr. Ethelene Hal. He is not sedate by the medication. He is receiving it from no other source. No previous history of problems with that. I will provide him for that since Dr. Ethelene Hal is not available. I will inform him of that prescription.  Plan microlumbar decompression L4-5 bilateral  Dorothy Spark., PA-C for Dr. Shelle Iron 12/08/2015, 8:54 AM

## 2015-12-22 NOTE — Patient Instructions (Signed)
Howard [name]  12/22/2015   Your procedure is scheduled on: 12/31/2015    Report to [facility] Main  Entrance take [place]  elevators to 3rd floor to  Short Stay Center at   0630 AM.  Call this number if you have problems the morning of surgery [phone]   Remember: ONLY 1 PERSON MAY GO WITH YOU TO SHORT STAY TO GET  READY MORNING OF YOUR SURGERY.  Do not eat food or drink liquids :After Midnight.     Take these medicines the morning of surgery with A SIP OF WATER: Cymbalta, Hydrocodone if needed                                 You may not have any metal on your body including hair pins and              piercings  Do not wear jewelry,  lotions, powders or perfumes, deodorant                      Men may shave face and neck.   Do not bring valuables to the hospital. [place] IS NOT             RESPONSIBLE   FOR VALUABLES.  Contacts, dentures or bridgework may not be worn into surgery.  Leave suitcase in the car. After surgery it may be brought to your room.         Special Instructions: coughing and deep breathing exercises, leg exercises               Please read over the following fact sheets you were given: _____________________________________________________________________             [facility] - Preparing for Surgery Before surgery, you can play an important role.  Because skin is not sterile, your skin needs to be as free of germs as possible.  You can reduce the number of germs on your skin by washing with CHG (chlorahexidine gluconate) soap before surgery.  CHG is an antiseptic cleaner which kills germs and bonds with the skin to continue killing germs even after washing. Please DO NOT use if you have an allergy to CHG or antibacterial soaps.  If your skin becomes reddened/irritated stop using the CHG and inform your nurse when you arrive at Short Stay. Do not shave (including legs and underarms) for at least 48 hours prior to the first  CHG shower.  You may shave your face/neck. Please follow these instructions carefully:  1.  Shower with CHG Soap the night before surgery and the  morning of Surgery.  2.  If you choose to wash your hair, wash your hair first as usual with your  normal  shampoo.  3.  After you shampoo, rinse your hair and body thoroughly to remove the  shampoo.                           4.  Use CHG as you would any other liquid soap.  You can apply chg directly  to the skin and wash                       Gently with a scrungie or clean washcloth.  5.  Apply  the CHG Soap to your body ONLY FROM THE NECK DOWN.   Do not use on face/ open                           Wound or open sores. Avoid contact with eyes, ears mouth and genitals (private parts).                       Wash face,  Genitals (private parts) with your normal soap.             6.  Wash thoroughly, paying special attention to the area where your surgery  will be performed.  7.  Thoroughly rinse your body with warm water from the neck down.  8.  DO NOT shower/wash with your normal soap after using and rinsing off  the CHG Soap.                9.  Pat yourself dry with a clean towel.            10.  Wear clean pajamas.            11.  Place clean sheets on your bed the night of your first shower and do not  sleep with pets. Day of Surgery : Do not apply any lotions/deodorants the morning of surgery.  Please wear clean clothes to the hospital/surgery center.  FAILURE TO FOLLOW THESE INSTRUCTIONS MAY RESULT IN THE CANCELLATION OF YOUR SURGERY PATIENT SIGNATURE_________________________________  NURSE SIGNATURE__________________________________  ________________________________________________________________________  WHAT IS A BLOOD TRANSFUSION? Blood Transfusion Information  A transfusion is the replacement of blood or some of its parts. Blood is made up of multiple cells which provide different functions.  Red blood cells carry oxygen and are used  for blood loss replacement.  White blood cells fight against infection.  Platelets control bleeding.  Plasma helps clot blood.  Other blood products are available for specialized needs, such as hemophilia or other clotting disorders. BEFORE THE TRANSFUSION  Who gives blood for transfusions?   Healthy volunteers who are fully evaluated to make sure their blood is safe. This is blood bank blood. Transfusion therapy is the safest it has ever been in the practice of medicine. Before blood is taken from a donor, a complete history is taken to make sure that person has no history of diseases nor engages in risky social behavior (examples are intravenous drug use or sexual activity with multiple partners). The donor's travel history is screened to minimize risk of transmitting infections, such as malaria. The donated blood is tested for signs of infectious diseases, such as HIV and hepatitis. The blood is then tested to be sure it is compatible with you in order to minimize the chance of a transfusion reaction. If you or a relative donates blood, this is often done in anticipation of surgery and is not appropriate for emergency situations. It takes many days to process the donated blood. RISKS AND COMPLICATIONS Although transfusion therapy is very safe and saves many lives, the main dangers of transfusion include:   Getting an infectious disease.  Developing a transfusion reaction. This is an allergic reaction to something in the blood you were given. Every precaution is taken to prevent this. The decision to have a blood transfusion has been considered carefully by your caregiver before blood is given. Blood is not given unless the benefits outweigh the risks. AFTER THE TRANSFUSION  Right after receiving a blood  transfusion, you will usually feel much better and more energetic. This is especially true if your red blood cells have gotten low (anemic). The transfusion raises the level of the red blood  cells which carry oxygen, and this usually causes an energy increase.  The nurse administering the transfusion will monitor you carefully for complications. HOME CARE INSTRUCTIONS  No special instructions are needed after a transfusion. You may find your energy is better. Speak with your caregiver about any limitations on activity for underlying diseases you may have. SEEK MEDICAL CARE IF:   Your condition is not improving after your transfusion.  You develop redness or irritation at the intravenous (IV) site. SEEK IMMEDIATE MEDICAL CARE IF:  Any of the following symptoms occur over the next 12 hours:  Shaking chills.  You have a temperature by mouth above 102 F (38.9 C), not controlled by medicine.  Chest, back, or muscle pain.  People around you feel you are not acting correctly or are confused.  Shortness of breath or difficulty breathing.  Dizziness and fainting.  You get a rash or develop hives.  You have a decrease in urine output.  Your urine turns a dark color or changes to pink, red, or brown. Any of the following symptoms occur over the next 10 days:  You have a temperature by mouth above 102 F (38.9 C), not controlled by medicine.  Shortness of breath.  Weakness after normal activity.  The white part of the eye turns yellow (jaundice).  You have a decrease in the amount of urine or are urinating less often.  Your urine turns a dark color or changes to pink, red, or brown. Document Released: 04/30/2000 Document Revised: 07/26/2011 Document Reviewed: 12/18/2007 Exit[employer] Patient Information 2014 [place], [place].  _______________________________________________________________________  Incentive Spirometer  An incentive spirometer is a tool that can help keep your lungs clear and active. This tool measures how well you are filling your lungs with each breath. Taking long deep breaths may help reverse or decrease the chance of developing breathing  (pulmonary) problems (especially infection) following:  A long period of time when you are unable to move or be active. BEFORE THE PROCEDURE   If the spirometer includes an indicator to show your best effort, your nurse or respiratory therapist will set it to a desired goal.  If possible, sit up straight or lean slightly forward. Try not to slouch.  Hold the incentive spirometer in an upright position. INSTRUCTIONS FOR USE  1. Sit on the edge of your bed if possible, or sit up as far as you can in bed or on a chair. 2. Hold the incentive spirometer in an upright position. 3. Breathe out normally. 4. Place the mouthpiece in your mouth and seal your lips tightly around it. 5. Breathe in slowly and as deeply as possible, raising the piston or the ball toward the top of the column. 6. Hold your breath for 3-5 seconds or for as long as possible. Allow the piston or ball to fall to the bottom of the column. 7. Remove the mouthpiece from your mouth and breathe out normally. 8. Rest for a few seconds and repeat Steps 1 through 7 at least 10 times every 1-2 hours when you are awake. Take your time and take a few normal breaths between deep breaths. 9. The spirometer may include an indicator to show your best effort. Use the indicator as a goal to work toward during each repetition. 10. After each set of 10 deep  breaths, practice coughing to be sure your lungs are clear. If you have an incision (the cut made at the time of surgery), support your incision when coughing by placing a pillow or rolled up towels firmly against it. Once you are able to get out of bed, walk around indoors and cough well. You may stop using the incentive spirometer when instructed by your caregiver.  RISKS AND COMPLICATIONS  Take your time so you do not get dizzy or light-headed.  If you are in pain, you may need to take or ask for pain medication before doing incentive spirometry. It is harder to take a deep breath if you  are having pain. AFTER USE  Rest and breathe slowly and easily.  It can be helpful to keep track of a log of your progress. Your caregiver can provide you with a simple table to help with this. If you are using the spirometer at home, follow these instructions: [place] IF:   You are having difficultly using the spirometer.  You have trouble using the spirometer as often as instructed.  Your pain medication is not giving enough relief while using the spirometer.  You develop fever of 100.5 F (38.1 C) or higher. SEEK IMMEDIATE MEDICAL CARE IF:   You cough up bloody sputum that had not been present before.  You develop fever of 102 F (38.9 C) or greater.  You develop worsening pain at or near the incision site. MAKE SURE YOU:   Understand these instructions.  Will watch your condition.  Will get help right away if you are not doing well or get worse. Document Released: 09/13/2006 Document Revised: 07/26/2011 Document Reviewed: 11/14/2006 [facility] Patient Information 2014 [place], [place].   ________________________________________________________________________

## 2015-12-24 ENCOUNTER — Encounter (INDEPENDENT_AMBULATORY_CARE_PROVIDER_SITE_OTHER): Payer: Self-pay

## 2015-12-24 ENCOUNTER — Encounter (HOSPITAL_COMMUNITY)
Admission: RE | Admit: 2015-12-24 | Discharge: 2015-12-24 | Disposition: A | Discharge status: Home / Self Care | Payer: Managed Care, Other (non HMO) | Source: Ambulatory Visit | Attending: Specialist | Admitting: Specialist

## 2015-12-24 ENCOUNTER — Encounter (HOSPITAL_COMMUNITY): Payer: Self-pay

## 2015-12-24 ENCOUNTER — Ambulatory Visit (HOSPITAL_COMMUNITY)
Admission: RE | Admit: 2015-12-24 | Discharge: 2015-12-24 | Disposition: A | Discharge status: Home / Self Care | Payer: Managed Care, Other (non HMO) | Source: Ambulatory Visit | Attending: Orthopedic Surgery | Admitting: Orthopedic Surgery

## 2015-12-24 DIAGNOSIS — I700 Atherosclerosis of aorta: Secondary | ICD-10-CM | POA: Diagnosis not present

## 2015-12-24 DIAGNOSIS — M4186 Other forms of scoliosis, lumbar region: Secondary | ICD-10-CM | POA: Diagnosis not present

## 2015-12-24 DIAGNOSIS — Z9889 Other specified postprocedural states: Secondary | ICD-10-CM | POA: Diagnosis not present

## 2015-12-24 DIAGNOSIS — M4184 Other forms of scoliosis, thoracic region: Secondary | ICD-10-CM | POA: Insufficient documentation

## 2015-12-24 DIAGNOSIS — M4806 Spinal stenosis, lumbar region: Secondary | ICD-10-CM | POA: Diagnosis present

## 2015-12-24 DIAGNOSIS — M48061 Spinal stenosis, lumbar region without neurogenic claudication: Secondary | ICD-10-CM

## 2015-12-24 LAB — CBC
HEMATOCRIT: 42.6 % (ref 39.0–52.0)
HEMOGLOBIN: 14.5 g/dL (ref 13.0–17.0)
MCH: 34 pg (ref 26.0–34.0)
MCHC: 34 g/dL (ref 30.0–36.0)
MCV: 100 fL (ref 78.0–100.0)
PLATELETS: 185 10*3/uL (ref 150–400)
RBC: 4.26 MIL/uL (ref 4.22–5.81)
RDW: 13.8 % (ref 11.5–15.5)
WBC: 6.2 10*3/uL (ref 4.0–10.5)

## 2015-12-24 LAB — BASIC METABOLIC PANEL
ANION GAP: 6 (ref 5–15)
BUN: 14 mg/dL (ref 6–20)
CHLORIDE: 104 mmol/L (ref 101–111)
CO2: 27 mmol/L (ref 22–32)
Calcium: 8.9 mg/dL (ref 8.9–10.3)
Creatinine, Ser: 0.66 mg/dL (ref 0.61–1.24)
GFR calc non Af Amer: >60 mL/min (ref >60)
Glucose, Bld: 110 mg/dL — ABNORMAL HIGH (ref 65–99)
POTASSIUM: 4 mmol/L (ref 3.5–5.1)
SODIUM: 137 mmol/L (ref 135–145)

## 2015-12-24 LAB — SURGICAL PCR SCREEN
MRSA, PCR: NEGATIVE
Staphylococcus aureus: POSITIVE — AB

## 2015-12-29 ENCOUNTER — Ambulatory Visit: Payer: Self-pay | Admitting: Orthopedic Surgery

## 2015-12-31 ENCOUNTER — Ambulatory Visit (HOSPITAL_COMMUNITY)
Admission: RE | Admit: 2015-12-31 | Discharge: 2016-01-01 | Disposition: A | Discharge status: Home / Self Care | Payer: Managed Care, Other (non HMO) | Source: Ambulatory Visit | Attending: Specialist | Admitting: Specialist

## 2015-12-31 ENCOUNTER — Encounter (HOSPITAL_COMMUNITY)
Admission: RE | Disposition: A | Discharge status: Home / Self Care | Payer: Self-pay | Source: Ambulatory Visit | Attending: Specialist

## 2015-12-31 ENCOUNTER — Encounter (HOSPITAL_COMMUNITY): Payer: Self-pay | Admitting: Certified Registered Nurse Anesthetist

## 2015-12-31 ENCOUNTER — Ambulatory Visit (HOSPITAL_COMMUNITY): Payer: Managed Care, Other (non HMO)

## 2015-12-31 ENCOUNTER — Ambulatory Visit (HOSPITAL_COMMUNITY): Payer: Managed Care, Other (non HMO) | Admitting: Certified Registered Nurse Anesthetist

## 2015-12-31 DIAGNOSIS — M5126 Other intervertebral disc displacement, lumbar region: Secondary | ICD-10-CM | POA: Diagnosis not present

## 2015-12-31 DIAGNOSIS — Z419 Encounter for procedure for purposes other than remedying health state, unspecified: Secondary | ICD-10-CM

## 2015-12-31 DIAGNOSIS — M4807 Spinal stenosis, lumbosacral region: Secondary | ICD-10-CM | POA: Diagnosis present

## 2015-12-31 DIAGNOSIS — Z96642 Presence of left artificial hip joint: Secondary | ICD-10-CM | POA: Diagnosis not present

## 2015-12-31 DIAGNOSIS — Z7982 Long term (current) use of aspirin: Secondary | ICD-10-CM | POA: Diagnosis not present

## 2015-12-31 DIAGNOSIS — F17210 Nicotine dependence, cigarettes, uncomplicated: Secondary | ICD-10-CM | POA: Insufficient documentation

## 2015-12-31 DIAGNOSIS — F329 Major depressive disorder, single episode, unspecified: Secondary | ICD-10-CM | POA: Diagnosis not present

## 2015-12-31 DIAGNOSIS — M48061 Spinal stenosis, lumbar region without neurogenic claudication: Secondary | ICD-10-CM

## 2015-12-31 HISTORY — PX: LUMBAR LAMINECTOMY/DECOMPRESSION MICRODISCECTOMY: SHX5026

## 2015-12-31 LAB — TYPE AND SCREEN
ABO/RH(D): A NEG
Antibody Screen: NEGATIVE

## 2015-12-31 SURGERY — LUMBAR LAMINECTOMY/DECOMPRESSION MICRODISCECTOMY 1 LEVEL
Anesthesia: General | Laterality: Bilateral

## 2015-12-31 MED ORDER — OXYCODONE-ACETAMINOPHEN 5-325 MG PO TABS: 1.0000 | ORAL_TABLET | ORAL | Status: DC | PRN

## 2015-12-31 MED ORDER — DOCUSATE SODIUM 100 MG PO CAPS
100.0000 mg | ORAL_CAPSULE | Freq: Two times a day (BID) | ORAL | Status: DC
  Administered 2015-12-31 – 2016-01-01 (×2): 100 mg via ORAL
  Filled 2015-12-31 (×2): qty 1

## 2015-12-31 MED ORDER — OXYCODONE HCL 5 MG/5ML PO SOLN: 5.0000 mg | Freq: Once | ORAL | Status: AC | PRN

## 2015-12-31 MED ORDER — CEFAZOLIN SODIUM-DEXTROSE 2-4 GM/100ML-% IV SOLN
2.0000 g | INTRAVENOUS | Status: AC
  Administered 2015-12-31: 2 g via INTRAVENOUS

## 2015-12-31 MED ORDER — HYDROMORPHONE HCL 1 MG/ML IJ SOLN
INTRAMUSCULAR | Status: AC
  Filled 2015-12-31: qty 1

## 2015-12-31 MED ORDER — OXYCODONE HCL 5 MG PO TABS
ORAL_TABLET | ORAL | Status: AC
  Administered 2015-12-31: 5 mg via ORAL
  Filled 2015-12-31: qty 1

## 2015-12-31 MED ORDER — BUPIVACAINE-EPINEPHRINE 0.5% -1:200000 IJ SOLN
INTRAMUSCULAR | Status: DC | PRN
  Administered 2015-12-31: 20 mL

## 2015-12-31 MED ORDER — HYDROCODONE-ACETAMINOPHEN 5-325 MG PO TABS
1.0000 | ORAL_TABLET | ORAL | Status: DC | PRN
  Administered 2015-12-31 – 2016-01-01 (×4): 2 via ORAL
  Filled 2015-12-31 (×4): qty 2

## 2015-12-31 MED ORDER — PROPOFOL 10 MG/ML IV BOLUS
INTRAVENOUS | Status: AC
  Filled 2015-12-31: qty 20

## 2015-12-31 MED ORDER — CEFAZOLIN SODIUM-DEXTROSE 2-4 GM/100ML-% IV SOLN
INTRAVENOUS | Status: AC
  Filled 2015-12-31: qty 100

## 2015-12-31 MED ORDER — RISAQUAD PO CAPS
1.0000 | ORAL_CAPSULE | Freq: Every day | ORAL | Status: DC
  Administered 2016-01-01: 1 via ORAL
  Filled 2015-12-31: qty 1

## 2015-12-31 MED ORDER — MAGNESIUM CITRATE PO SOLN: 1.0000 | Freq: Once | ORAL | Status: DC | PRN

## 2015-12-31 MED ORDER — SODIUM CHLORIDE 0.9 % IR SOLN
Status: AC
  Filled 2015-12-31: qty 1

## 2015-12-31 MED ORDER — OXYCODONE-ACETAMINOPHEN 5-325 MG PO TABS: 1.0000 | ORAL_TABLET | ORAL | 0 refills | Status: DC | PRN

## 2015-12-31 MED ORDER — PROPOFOL 10 MG/ML IV BOLUS
INTRAVENOUS | Status: DC | PRN
  Administered 2015-12-31: 200 mg via INTRAVENOUS
  Administered 2015-12-31: 100 mg via INTRAVENOUS

## 2015-12-31 MED ORDER — METHOCARBAMOL 500 MG PO TABS
500.0000 mg | ORAL_TABLET | Freq: Four times a day (QID) | ORAL | Status: DC | PRN
  Administered 2015-12-31 – 2016-01-01 (×3): 500 mg via ORAL
  Filled 2015-12-31 (×3): qty 1

## 2015-12-31 MED ORDER — HYDROMORPHONE HCL 1 MG/ML IJ SOLN
0.2500 mg | INTRAMUSCULAR | Status: DC | PRN
  Administered 2015-12-31 (×4): 0.5 mg via INTRAVENOUS

## 2015-12-31 MED ORDER — METHOCARBAMOL 1000 MG/10ML IJ SOLN
500.0000 mg | Freq: Four times a day (QID) | INTRAVENOUS | Status: DC | PRN
  Administered 2015-12-31: 500 mg via INTRAVENOUS
  Filled 2015-12-31: qty 550
  Filled 2015-12-31: qty 5

## 2015-12-31 MED ORDER — THROMBIN 5000 UNITS EX SOLR
CUTANEOUS | Status: AC
  Filled 2015-12-31: qty 10000

## 2015-12-31 MED ORDER — BISACODYL 5 MG PO TBEC: 5.0000 mg | DELAYED_RELEASE_TABLET | Freq: Every day | ORAL | Status: DC | PRN

## 2015-12-31 MED ORDER — PHENYLEPHRINE HCL 10 MG/ML IJ SOLN
INTRAMUSCULAR | Status: DC | PRN
  Administered 2015-12-31 (×4): 80 ug via INTRAVENOUS

## 2015-12-31 MED ORDER — SUGAMMADEX SODIUM 200 MG/2ML IV SOLN
INTRAVENOUS | Status: AC
  Filled 2015-12-31: qty 2

## 2015-12-31 MED ORDER — ONDANSETRON HCL 4 MG/2ML IJ SOLN
INTRAMUSCULAR | Status: DC | PRN
  Administered 2015-12-31: 4 mg via INTRAVENOUS

## 2015-12-31 MED ORDER — ACETAMINOPHEN 650 MG RE SUPP
650.0000 mg | RECTAL | Status: DC | PRN
Start: 2015-12-31 — End: 2016-01-01

## 2015-12-31 MED ORDER — METHOCARBAMOL 500 MG PO TABS: 500.0000 mg | ORAL_TABLET | Freq: Four times a day (QID) | ORAL | 1 refills | Status: DC | PRN

## 2015-12-31 MED ORDER — KCL IN DEXTROSE-NACL 20-5-0.45 MEQ/L-%-% IV SOLN
INTRAVENOUS | Status: DC
  Administered 2015-12-31: 15:00:00 via INTRAVENOUS
  Filled 2015-12-31 (×2): qty 1000

## 2015-12-31 MED ORDER — HYDROMORPHONE HCL 1 MG/ML IJ SOLN
0.5000 mg | INTRAMUSCULAR | Status: DC | PRN
  Administered 2015-12-31 – 2016-01-01 (×7): 1 mg via INTRAVENOUS
  Filled 2015-12-31 (×7): qty 1

## 2015-12-31 MED ORDER — LACTATED RINGERS IV SOLN
INTRAVENOUS | Status: DC
  Administered 2015-12-31 (×2): via INTRAVENOUS

## 2015-12-31 MED ORDER — DULOXETINE HCL 30 MG PO CPEP
30.0000 mg | ORAL_CAPSULE | Freq: Every day | ORAL | Status: DC
  Administered 2016-01-01: 30 mg via ORAL
  Filled 2015-12-31: qty 1

## 2015-12-31 MED ORDER — ONDANSETRON HCL 4 MG/2ML IJ SOLN
INTRAMUSCULAR | Status: AC
  Filled 2015-12-31: qty 2

## 2015-12-31 MED ORDER — EPHEDRINE SULFATE 50 MG/ML IJ SOLN
INTRAMUSCULAR | Status: DC | PRN
  Administered 2015-12-31: 5 mg via INTRAVENOUS
  Administered 2015-12-31: 10 mg via INTRAVENOUS

## 2015-12-31 MED ORDER — POLYETHYLENE GLYCOL 3350 17 G PO PACK: 17.0000 g | PACK | Freq: Every day | ORAL | Status: DC | PRN

## 2015-12-31 MED ORDER — BUPIVACAINE-EPINEPHRINE (PF) 0.5% -1:200000 IJ SOLN
INTRAMUSCULAR | Status: AC
  Filled 2015-12-31: qty 30

## 2015-12-31 MED ORDER — SUGAMMADEX SODIUM 200 MG/2ML IV SOLN
INTRAVENOUS | Status: DC | PRN
  Administered 2015-12-31: 200 mg via INTRAVENOUS

## 2015-12-31 MED ORDER — PROMETHAZINE HCL 25 MG/ML IJ SOLN: 6.2500 mg | INTRAMUSCULAR | Status: DC | PRN

## 2015-12-31 MED ORDER — CEFAZOLIN SODIUM-DEXTROSE 2-4 GM/100ML-% IV SOLN
2.0000 g | Freq: Three times a day (TID) | INTRAVENOUS | Status: AC
  Administered 2015-12-31 – 2016-01-01 (×2): 2 g via INTRAVENOUS
  Filled 2015-12-31 (×2): qty 100

## 2015-12-31 MED ORDER — SODIUM CHLORIDE 0.9 % IR SOLN
Status: DC | PRN
  Administered 2015-12-31: 500 mL

## 2015-12-31 MED ORDER — MENTHOL 3 MG MT LOZG: 1.0000 | LOZENGE | OROMUCOSAL | Status: DC | PRN

## 2015-12-31 MED ORDER — FENTANYL CITRATE (PF) 250 MCG/5ML IJ SOLN
INTRAMUSCULAR | Status: AC
  Filled 2015-12-31: qty 5

## 2015-12-31 MED ORDER — ALUM & MAG HYDROXIDE-SIMETH 200-200-20 MG/5ML PO SUSP: 30.0000 mL | Freq: Four times a day (QID) | ORAL | Status: DC | PRN

## 2015-12-31 MED ORDER — ACETAMINOPHEN 325 MG PO TABS: 650.0000 mg | ORAL_TABLET | ORAL | Status: DC | PRN

## 2015-12-31 MED ORDER — LIDOCAINE HCL (CARDIAC) 20 MG/ML IV SOLN
INTRAVENOUS | Status: DC | PRN
  Administered 2015-12-31: 50 mg via INTRAVENOUS

## 2015-12-31 MED ORDER — MIDAZOLAM HCL 2 MG/2ML IJ SOLN
INTRAMUSCULAR | Status: AC
  Filled 2015-12-31: qty 2

## 2015-12-31 MED ORDER — TRAMADOL HCL 50 MG PO TABS: 50.0000 mg | ORAL_TABLET | Freq: Four times a day (QID) | ORAL | 1 refills | Status: DC | PRN

## 2015-12-31 MED ORDER — LIDOCAINE HCL (CARDIAC) 20 MG/ML IV SOLN
INTRAVENOUS | Status: AC
  Filled 2015-12-31: qty 5

## 2015-12-31 MED ORDER — ROCURONIUM BROMIDE 100 MG/10ML IV SOLN
INTRAVENOUS | Status: DC | PRN
  Administered 2015-12-31: 50 mg via INTRAVENOUS
  Administered 2015-12-31 (×2): 10 mg via INTRAVENOUS
  Administered 2015-12-31: 20 mg via INTRAVENOUS

## 2015-12-31 MED ORDER — ONDANSETRON HCL 4 MG/2ML IJ SOLN: 4.0000 mg | INTRAMUSCULAR | Status: DC | PRN

## 2015-12-31 MED ORDER — CLINDAMYCIN PHOSPHATE 900 MG/50ML IV SOLN
INTRAVENOUS | Status: AC
  Filled 2015-12-31: qty 50

## 2015-12-31 MED ORDER — PHENOL 1.4 % MT LIQD: 1.0000 | OROMUCOSAL | Status: DC | PRN

## 2015-12-31 MED ORDER — CLINDAMYCIN PHOSPHATE 900 MG/50ML IV SOLN
900.0000 mg | INTRAVENOUS | Status: AC
  Administered 2015-12-31: 900 mg via INTRAVENOUS

## 2015-12-31 MED ORDER — MIDAZOLAM HCL 5 MG/5ML IJ SOLN
INTRAMUSCULAR | Status: DC | PRN
  Administered 2015-12-31: 2 mg via INTRAVENOUS

## 2015-12-31 MED ORDER — OXYCODONE HCL 5 MG PO TABS
5.0000 mg | ORAL_TABLET | Freq: Once | ORAL | Status: AC | PRN
  Administered 2015-12-31: 5 mg via ORAL

## 2015-12-31 MED ORDER — POLYETHYLENE GLYCOL 3350 17 G PO PACK: 17.0000 g | PACK | Freq: Every day | ORAL | 0 refills | Status: DC

## 2015-12-31 MED ORDER — FENTANYL CITRATE (PF) 100 MCG/2ML IJ SOLN
INTRAMUSCULAR | Status: DC | PRN
Start: 2015-12-31 — End: 2015-12-31
  Administered 2015-12-31 (×2): 100 ug via INTRAVENOUS
  Administered 2015-12-31: 50 ug via INTRAVENOUS

## 2015-12-31 SURGICAL SUPPLY — 50 items
BAG ZIPLOCK 12X15 (MISCELLANEOUS) IMPLANT
CLEANER TIP ELECTROSURG 2X2 (MISCELLANEOUS) ×2 IMPLANT
CLOTH 2% CHLOROHEXIDINE 3PK (PERSONAL CARE ITEMS) ×2 IMPLANT
DRAPE MICROSCOPE LEICA (MISCELLANEOUS) ×2 IMPLANT
DRAPE POUCH INSTRU U-SHP 10X18 (DRAPES) ×2 IMPLANT
DRAPE SHEET LG 3/4 BI-LAMINATE (DRAPES) ×2 IMPLANT
DRAPE SURG 17X11 SM STRL (DRAPES) ×2 IMPLANT
DRAPE UTILITY XL STRL (DRAPES) ×2 IMPLANT
DRSG AQUACEL AG ADV 3.5X 4 (GAUZE/BANDAGES/DRESSINGS) ×2 IMPLANT
DRSG AQUACEL AG ADV 3.5X 6 (GAUZE/BANDAGES/DRESSINGS) ×2 IMPLANT
DURAPREP 26ML APPLICATOR (WOUND CARE) ×2 IMPLANT
DURASEAL SPINE SEALANT 3ML (MISCELLANEOUS) IMPLANT
ELECT BLADE TIP CTD 4 INCH (ELECTRODE) IMPLANT
ELECT REM PT RETURN 9FT ADLT (ELECTROSURGICAL) ×2
ELECTRODE REM PT RTRN 9FT ADLT (ELECTROSURGICAL) ×1 IMPLANT
GLOVE BIOGEL PI IND STRL 7.0 (GLOVE) ×1 IMPLANT
GLOVE BIOGEL PI INDICATOR 7.0 (GLOVE) ×1
GLOVE SURG SS PI 7.0 STRL IVOR (GLOVE) ×2 IMPLANT
GLOVE SURG SS PI 7.5 STRL IVOR (GLOVE) ×2 IMPLANT
GLOVE SURG SS PI 8.0 STRL IVOR (GLOVE) ×4 IMPLANT
GOWN STRL REUS W/TWL XL LVL3 (GOWN DISPOSABLE) ×4 IMPLANT
HEMOSTAT SPONGE AVITENE ULTRA (HEMOSTASIS) IMPLANT
IV CATH 14GX2 1/4 (CATHETERS) ×2 IMPLANT
KIT BASIN OR (CUSTOM PROCEDURE TRAY) ×2 IMPLANT
KIT POSITIONING SURG ANDREWS (MISCELLANEOUS) ×2 IMPLANT
LIQUID BAND (GAUZE/BANDAGES/DRESSINGS) ×2 IMPLANT
MANIFOLD NEPTUNE II (INSTRUMENTS) ×2 IMPLANT
NEEDLE SPNL 18GX3.5 QUINCKE PK (NEEDLE) ×4 IMPLANT
PACK LAMINECTOMY ORTHO (CUSTOM PROCEDURE TRAY) ×2 IMPLANT
PATTIES SURGICAL .5 X.5 (GAUZE/BANDAGES/DRESSINGS) IMPLANT
PATTIES SURGICAL .75X.75 (GAUZE/BANDAGES/DRESSINGS) IMPLANT
PATTIES SURGICAL 1X1 (DISPOSABLE) IMPLANT
RUBBERBAND STERILE (MISCELLANEOUS) ×4 IMPLANT
SPONGE SURGIFOAM ABS GEL 100 (HEMOSTASIS) ×2 IMPLANT
STAPLER VISISTAT (STAPLE) ×2 IMPLANT
STRIP CLOSURE SKIN 1/2X4 (GAUZE/BANDAGES/DRESSINGS) ×2 IMPLANT
SUT NURALON 4 0 TR CR/8 (SUTURE) IMPLANT
SUT PROLENE 3 0 PS 2 (SUTURE) ×2 IMPLANT
SUT STRATAFIX 0 PDS 27 VIOLET (SUTURE) ×2
SUT VIC AB 1 CT1 27 (SUTURE)
SUT VIC AB 1 CT1 27XBRD ANTBC (SUTURE) IMPLANT
SUT VIC AB 1-0 CT2 27 (SUTURE) ×2 IMPLANT
SUT VIC AB 2-0 CT1 27 (SUTURE) ×1
SUT VIC AB 2-0 CT1 TAPERPNT 27 (SUTURE) ×1 IMPLANT
SUT VIC AB 2-0 CT2 27 (SUTURE) ×4 IMPLANT
SUTURE STRATFX 0 PDS 27 VIOLET (SUTURE) ×1 IMPLANT
SYR 3ML LL SCALE MARK (SYRINGE) IMPLANT
TOWEL OR 17X26 10 PK STRL BLUE (TOWEL DISPOSABLE) ×2 IMPLANT
TOWEL OR NON WOVEN STRL DISP B (DISPOSABLE) IMPLANT
YANKAUER SUCT BULB TIP NO VENT (SUCTIONS) IMPLANT

## 2015-12-31 NOTE — Anesthesia Postprocedure Evaluation (Signed)
Anesthesia Post Note  Patient: Howard [name]  Procedure(s) Performed: Procedure(s) (LRB): LUMBER DECOMPRESSION L4-5/ L5-S1 BILATERALLY   (Bilateral)  Patient location during evaluation: PACU Anesthesia Type: General Level of consciousness: awake and alert Pain management: pain level controlled Vital Signs Assessment: post-procedure vital signs reviewed and stable Respiratory status: spontaneous breathing, nonlabored ventilation, respiratory function stable and patient connected to nasal cannula oxygen Cardiovascular status: blood pressure returned to baseline and stable Postop Assessment: no signs of nausea or vomiting Anesthetic complications: no    Last Vitals:  Vitals:   12/31/15 1145 12/31/15 1200  BP: 107/75 116/84  Pulse: 74 77  Resp: 12 13  Temp: 36.3 C     Last Pain:  Vitals:   12/31/15 1200  TempSrc:   PainSc: 4                  Kennieth RadFitzgerald, Breionna Punt E

## 2015-12-31 NOTE — Interval H&P Note (Signed)
History and Physical Interval Note:  12/31/2015 8:32 AM  Howard [name]  has presented today for surgery, with the diagnosis of STENOSIS L4-5  The various methods of treatment have been discussed with the patient and family. After consideration of risks, benefits and other options for treatment, the patient has consented to  Procedure(s): LUMBER DECOMPRESSION L4-5 BILATERALLY (Bilateral) as a surgical intervention .  The patient's history has been reviewed, patient examined, no change in status, stable for surgery.  I have reviewed the patient's chart and labs.  Questions were answered to the patient's satisfaction.     Sai Zinn C

## 2015-12-31 NOTE — H&P (View-Only) (Signed)
Aram CandelaJohn R Current is an 50 y.o. male.   Chief Complaint: back and L leg pain HPI: The patient is a [age] year old male who presents today for follow up of their back. The patient is being followed for their back pain. They are now year(s) out from when symptoms began. Symptoms reported today include: pain (low back) and leg pain (left). Current treatment includes: NSAIDs (Ibuprofen) and pain medications. The following medication has been used for pain control: Norco (10/325mg ). The patient reports their current pain level to be 8 / 10. The patient presents today following CT/Myelogram.  Jonny RuizJohn Mcdonagh follows up. He is still having pain, worse with activity, better with rest. His MRI demonstrates bilateral left greater than right L5 nerve root impingement, dynamic instability with worsening of the stenosis in standing. There is no listhesis. He reports no back pain, mainly leg pain. Here with his wife. He has reported taking the Vicodin 10 every four to six hours. It does not seem to be working.  Past Medical History:  Diagnosis Date  . Alcohol abuse   . Anxiety   . Chronic back pain   . Chronic pain   . Osteoarthritis   . Psoriasis     Past Surgical History:  Procedure Laterality Date  . APPENDECTOMY    . TOTAL HIP ARTHROPLASTY    . TOTAL HIP ARTHROPLASTY Left 08/27/2014   Procedure: LEFT TOTAL HIP ARTHROPLASTY ANTERIOR APPROACH;  Surgeon: Durene RomansMatthew Olin, MD;  Location: [facility];  Service: Orthopedics;  Laterality: Left;    No family history on file. Social History:  reports that he has been smoking Cigarettes.  He has a 15.00 pack-year smoking history. He has never used smokeless tobacco. He reports that he drinks about 8.4 oz of alcohol per week . He reports that he does not use drugs.  Allergies: No Known Allergies   (Not in a hospital admission)  No results found for this or any previous visit (from the past 48 hour(s)). No results found.  Review of Systems  Constitutional: Negative.    HENT: Negative.   Eyes: Negative.   Respiratory: Negative.   Cardiovascular: Negative.   Gastrointestinal: Negative.   Genitourinary: Negative.   Musculoskeletal: Positive for back pain.  Skin: Negative.   Neurological: Positive for sensory change and focal weakness.  Psychiatric/Behavioral: Negative.     There were no vitals taken for this visit. Physical Exam  Constitutional: He is oriented to person, place, and time. He appears well-developed. He appears distressed.  HENT:  Head: Normocephalic.  Eyes: Pupils are equal, round, and reactive to light.  Neck: Normal range of motion.  Cardiovascular: Normal rate.   Respiratory: Effort normal.  GI: Soft.  Musculoskeletal:  Straight leg raise with buttock, thigh and calf pain on the left, negative on the right. EHLs in dorsiflexion is 4+/5. Pain with extension, relieved with forward flexion. Lumbar spine exam reveals no evidence of soft tissue swelling, deformity or skin ecchymosis. On palpation there is no tenderness of the lumbar spine. No flank pain with percussion. The abdomen is soft and nontender. Nontender over the trochanters. No cellulitis or lymphadenopathy.  Motor is 5/5 including tibialis anterior, plantar flexion, quadriceps and hamstrings. Patient is normoreflexic. There is no Babinski or clonus. Sensory exam is intact to light touch. Patient has good distal pulses. No DVT. No pain and normal range of motion without instability of the hips, knees and ankles.  He is starting to get some pain into the right.  Neurological: He  is alert and oriented to person, place, and time.  Skin: Skin is warm and dry.  Psychiatric: He has a normal mood and affect.    X-rays reviewed. No fractures, subluxation, dislocation, lytic or blastic lesions. He is status post bilateral total hip replacements which are unremarkable. He has a scoliosis which is concave to the left in the lower lumbar spine causing some increased narrowing to the left  side at L4-5. Multilevel disc degeneration. There is a trace listhesis at L3-4 which slips slightly in flexion.   MRI images and report reviewed by Dr. Beane. Multilevel disc degeneration and disc desiccation. He has multifactorial stenosis at L4-5 to the left, lateral recess stenosis due to ligamentum flavum, hypertrophy, disc extrusion, also some foraminal stenosis, foraminal disc extrusion. This is likely underestimated due to his position on the MRI. At L5-S1, there is a smaller disc protrusion slightly more towards the left also causing some foraminal stenosis, but less lateral recess stenosis.  Myelogram demonstrate bilateral left greater than right L5 nerve root impingement, dynamic instability with worsening of the stenosis in standing. There is no listhesis.  Assessment/Plan 1. Symptomatic spinal stenosis with L5 nerve root impingement bilaterally. 2. Minimally symptomatic multilevel disc degeneration.  We discussed options in extensive detail. He reports he is no longer able to tolerate. We therefore discussed lumbar decompression bilaterally at L4-5. I had an extensive discussion of the risks and benefits of the lumbar decompression with the patient including bleeding, infection, damage to neurovascular structures, epidural fibrosis, CSF leak requiring repair. We also discussed increase in pain, adjacent segment disease, recurrent disc herniation, need for future surgery including repeat decompression and/or fusion. We also discussed risks of postoperative hematoma, paralysis, anesthetic complications including DVT, PE, death, cardiopulmonary dysfunction. In addition, the perioperative and postoperative courses were discussed in detail including the rehabilitative time and return to functional activity and work. I provided the patient with an illustrated handout and utilized the appropriate surgical models.  No particular risks in terms of previous history of MRSA. No DVT, PE. In terms of his  medication, he says it has not been helping him. I gave him a prescription for Percocet 5/325 to be taken q.i.d. p.r.n. He is unsure whether he has a pain contract with Dr. Ramos. He is not sedate by the medication. He is receiving it from no other source. No previous history of problems with that. I will provide him for that since Dr. Ramos is not available. I will inform him of that prescription.  Plan microlumbar decompression L4-5 bilateral  BISSELL, JACLYN M., PA-C for Dr. Beane 12/08/2015, 8:54 AM   

## 2015-12-31 NOTE — Op Note (Signed)
NAME:  Howard [name], Howard [name]               ACCOUNT NO.:  [id number]  MEDICAL RECORD NO.:  [id number]  LOCATION:  [facility]                         [facility]  PHYSICIAN:  Jene EveryJeffrey Elmore Hyslop, M.D.    DATE OF BIRTH:  12/06/1965  DATE OF PROCEDURE:  12/31/2015 DATE OF DISCHARGE:                              OPERATIVE REPORT   PREOPERATIVE DIAGNOSIS:  Spinal stenosis at L4-5.  POSTOPERATIVE DIAGNOSIS:  Spinal stenosis at L4-5 and L5-S1.  PROCEDURES PERFORMED: 1. Microlumbar decompression at L4-5 bilaterally with foraminotomies     at L4-L5. 2. Hemilaminectomy and decompression at L5-S1, left with     foraminotomies at L5-S1.  ANESTHESIA:  General.  ASSISTANT:  Lanna PocheJacqueline Bissell, PA.  SPECIMEN:  L4-5 disk to Pathology.  HISTORY:  A 50 year old left lower extremity radicular pain and L5 nerve root distribution secondary to disk herniation, myelogram indicating severe stenosis bilaterally at L4-5 and L5 nerve root on the left.  He was indicated for decompression centrally at 4-5 and mild scoliosis, severe facet hypertrophy, disk degeneration at 4-5 and at 5-1 and minimal mechanical back pain, predominantly leg pain, was indicated for decompression promptly at L4-5 and a possibility of decompression at the adjacent segment to fully decompress the L4 and the L5 nerve roots. Risk and benefits were discussed including bleeding, infection, damage to the neurovascular structures, DVT, PE, anesthetic complications, no changes in symptoms, worsening symptoms, etc.  The patient also declined consideration of the fusion due to his multilevel disk degeneration, scoliosis possibility that in the future.  TECHNIQUE:  With the patient in supine position, after induction of adequate general anesthesia, 2 g of Kefzol, 900 of clinda, placed prone on the [place] frame.  All bony prominences were well padded.  Lumbar region was prepped and draped in usual sterile fashion, especially the 90-90 position,  carefully positioning him as he had bilateral hip replacements.  Foley to gravity.  Confirmed radiograph after prepping and draping from the spinous process of above 4 to just below 5. Subcutaneous tissue was dissected.  Electrocautery was utilized to achieve hemostasis.  A 0.25% Marcaine with epinephrine was infiltrated in the paraspinous tissue.  We divided the fascia in line with the skin incision.  Paraspinous muscle elevated from lamina of 4-5 and 5-1. McCullough retractor was placed.  Operating microscope was draped and brought out on the surgical field.  It was very small if not absent interlaminar window at L4-5 on the left.  Due to facet hypertrophy and ligamentum flavum hypertrophy, we removed the spinous process therefore of 5, and then proceeded with a central decompression at 4-5, straight curette, utilized to detach the ligamentum flavum from the cephalad edge of 5, caudad edge of 4.  Hypertrophic ligamentum flavum was noted.  I used a 2-mm centrally and performed bilateral hemilaminotomies of 4 to the pedicle of 4, preserving the neural arch as were able to obtain decompression of the pedicle of 4 consistent with that seen on the pathology of the myelogram to an area that was good epidural fat.  Very severe hypertrophic ligamentum flavum was noted.  Patty placed beneath the ligamentum flavum.  We removed ligamentum flavum from the interspace with D'Errico and the neural  elements were well protected.  We decompressed the lateral recess to the medial border of the pedicle performing foraminotomies of L4.  Neuroprobe passed out the foramen of 5, found to be stenotic.  Neuroprobe passed out 5, found again to be stenotic.  Performed foraminotomy of 5, we felt still affected by the hemilamina of 5.  I then performed a hemilaminectomy of 5 on the left for full decompression of the 5 root, we checked it at 5-1 and decompressed the lateral recess at 5-1 as well.  Foraminotomy there  at 5- 1 with ligamentum flavum hypertrophy.  Following this hemilaminectomy, the neuroprobe passed freely up the foramen of 5 and there was still focal disk herniation at 4-5 and no disk degeneration.  We performed an annulotomy and removed copious portion of disk material from the disk space with a nerve hook and micropituitary.  We irrigated the disk space.  Additional fragments were retrieved.  Confirmatory radiograph obtained at that level.  After that, there was no CSF leakage.  Active bleeding was curtailed with bipolar electrocautery, bone wax and thrombin-soaked Gelfoam.  Good restoration of thecal sac.  Neuroprobe passed freely up the foramen of 4 and 5 bilaterally and S1 and 5 on the left.  I felt this decompressed the appropriate pathology.  There was no instability noted, facet was less than 25%, removed on the left. Thrombin-soaked Gelfoam again placed in the laminotomy defect.  We removed the [facility].  Paraspinous muscles were irrigated. No active bleeding, and we had performed a Valsalva prior to that, no active CSF leakage.  We closed the dorsolumbar fascia with a running STRATAFIX, subcu with 2-0 and skin with staples.  Wound was dressed sterilely, placed supine on the hospital bed, extubated without difficulty and transported to the recovery room in satisfactory condition.  The patient tolerated the procedure well.  No complications.  Assistant, Lanna PocheJacqueline Bissell, PA, was used throughout the case, patient positioning, gentle intermittent neural traction and closure.  Blood loss, 100 mL.     Jene EveryJeffrey Haisley Arens, M.D.     Cordelia PenJB/MEDQ  D:  12/31/2015  T:  12/31/2015  Job:  [id number]

## 2015-12-31 NOTE — Discharge Instructions (Signed)

## 2015-12-31 NOTE — Transfer of Care (Signed)
Immediate Anesthesia Transfer of Care Note  Patient: Howard CandelaJohn R [name]  Procedure(s) Performed: Procedure(s): LUMBER DECOMPRESSION L4-5/ L5-S1 BILATERALLY   (Bilateral)  Patient Location: PACU  Anesthesia Type:General  Level of Consciousness: awake, alert  and oriented  Airway & Oxygen Therapy: Patient Spontanous Breathing and Patient connected to face mask oxygen  Post-op Assessment: Report given to RN and Post -op Vital signs reviewed and stable  Post vital signs: Reviewed and stable  Last Vitals:  Vitals:   12/31/15 0624  BP: 114/73  Pulse: 73  Resp: 18  Temp: 36.8 C    Last Pain:  Vitals:   12/31/15 0709  TempSrc:   PainSc: 6       Patients Stated Pain Goal: 5 (12/31/15 0709)  Complications: No apparent anesthesia complications

## 2015-12-31 NOTE — Anesthesia Procedure Notes (Signed)
Procedure Name: Intubation Performed by: Emmaleigh Longo J Pre-anesthesia Checklist: Patient identified, Emergency Drugs available, Suction available, Patient being monitored and Timeout performed Patient Re-evaluated:Patient Re-evaluated prior to inductionOxygen Delivery Method: Circle system utilized Preoxygenation: Pre-oxygenation with 100% oxygen Intubation Type: IV induction Ventilation: Mask ventilation without difficulty Laryngoscope Size: Mac and 4 Grade View: Grade II Tube type: Oral Tube size: 7.5 mm Number of attempts: 1 Airway Equipment and Method: Stylet Placement Confirmation: ETT inserted through vocal cords under direct vision,  positive ETCO2,  CO2 detector and breath sounds checked- equal and bilateral Secured at: 23 cm Tube secured with: Tape Dental Injury: Teeth and Oropharynx as per pre-operative assessment        

## 2015-12-31 NOTE — Brief Op Note (Signed)
12/31/2015  10:53 AM  PATIENT:  Howard [name]  50 y.o. male  PRE-OPERATIVE DIAGNOSIS:  STENOSIS L4-5  POST-OPERATIVE DIAGNOSIS:  STENOSIS L4-5  PROCEDURE:  Procedure(s): LUMBER DECOMPRESSION L4-5/ L5-S1 BILATERALLY   (Bilateral)  SURGEON:  Surgeon(s) and Role:    * Jene EveryJeffrey Catrice Zuleta, MD - Primary  PHYSICIAN ASSISTANT:   ASSISTANTS: Bissell   ANESTHESIA:   general  EBL:  Total I/O In: 1000 [I.V.:1000] Out: 400 [Urine:300; Blood:100]  BLOOD ADMINISTERED:none  DRAINS: none   LOCAL MEDICATIONS USED:  MARCAINE     SPECIMEN:  Source of Specimen:  L45  DISPOSITION OF SPECIMEN:  PATHOLOGY  COUNTS:  YES  TOURNIQUET:  * No tourniquets in log *  DICTATION: .Other Dictation: Dictation Number [id number]  PLAN OF CARE: Admit for overnight observation  PATIENT DISPOSITION:  PACU - hemodynamically stable.   Delay start of Pharmacological VTE agent (>24hrs) due to surgical blood loss or risk of bleeding: yes

## 2015-12-31 NOTE — Anesthesia Preprocedure Evaluation (Addendum)
Anesthesia Evaluation  Patient identified by MRN, date of birth, ID band Patient awake    Reviewed: Allergy & Precautions, NPO status , Patient's Chart, lab work & pertinent test results  Airway Mallampati: II  TM Distance: >3 FB Neck ROM: Full    Dental  (+) Dental Advisory Given   Pulmonary Current Smoker,    breath sounds clear to auscultation       Cardiovascular negative cardio ROS   Rhythm:Regular Rate:Normal     Neuro/Psych Anxiety Depression    GI/Hepatic negative GI ROS, Neg liver ROS,   Endo/Other  negative endocrine ROS  Renal/GU negative Renal ROS     Musculoskeletal  (+) Arthritis ,   Abdominal   Peds  Hematology negative hematology ROS (+)   Anesthesia Other Findings   Reproductive/Obstetrics                            Lab Results  Component Value Date   WBC 6.2 12/24/2015   HGB 14.5 12/24/2015   HCT 42.6 12/24/2015   MCV 100.0 12/24/2015   PLT 185 12/24/2015   Lab Results  Component Value Date   CREATININE 0.66 12/24/2015   BUN 14 12/24/2015   NA 137 12/24/2015   K 4.0 12/24/2015   CL 104 12/24/2015   CO2 27 12/24/2015    Anesthesia Physical Anesthesia Plan  ASA: II  Anesthesia Plan: General   Post-op Pain Management:    Induction: Intravenous  Airway Management Planned: Oral ETT  Additional Equipment:   Intra-op Plan:   Post-operative Plan: Extubation in OR  Informed Consent: I have reviewed the patients History and Physical, chart, labs and discussed the procedure including the risks, benefits and alternatives for the proposed anesthesia with the patient or authorized representative who has indicated his/her understanding and acceptance.   Dental advisory given  Plan Discussed with: CRNA  Anesthesia Plan Comments:         Anesthesia Quick Evaluation

## 2016-01-01 DIAGNOSIS — M4807 Spinal stenosis, lumbosacral region: Secondary | ICD-10-CM | POA: Diagnosis not present

## 2016-01-01 MED ORDER — ASPIRIN EC 81 MG PO TBEC: 81.0000 mg | DELAYED_RELEASE_TABLET | ORAL | Status: DC | PRN

## 2016-01-01 NOTE — Progress Notes (Signed)
Subjective: 1 Day Post-Op Procedure(s) (LRB): LUMBER DECOMPRESSION L4-5/ L5-S1 BILATERALLY   (Bilateral) Patient reports pain as mild.  Reports incisional back pain, no leg pain. Ready to go home today. No other c/o.  Objective: Vital signs in last 24 hours: Temp:  [97.4 F (36.3 C)-98.3 F (36.8 C)] 98.1 F (36.7 C) (08/17 0532) Pulse Rate:  [71-82] 79 (08/17 0532) Resp:  [10-16] 16 (08/17 0532) BP: (87-118)/(50-84) 87/50 (08/17 0532) SpO2:  [94 %-100 %] 94 % (08/17 0532)  Intake/Output from previous day: 08/16 0701 - 08/17 0700 In: 1936.7 [P.O.:720; I.V.:1061.7; IV Piggyback:155] Out: 2000 [Urine:1900; Blood:100] Intake/Output this shift: No intake/output data recorded.  No results for input(s): HGB in the last 72 hours. No results for input(s): WBC, RBC, HCT, PLT in the last 72 hours. No results for input(s): NA, K, CL, CO2, BUN, CREATININE, GLUCOSE, CALCIUM in the last 72 hours. No results for input(s): LABPT, INR in the last 72 hours.  Neurologically intact ABD soft Neurovascular intact Sensation intact distally Intact pulses distally Dorsiflexion/Plantar flexion intact Incision: dressing C/D/I and no drainage No cellulitis present Compartment soft no calf pain or sign of DVT  Assessment/Plan: 1 Day Post-Op Procedure(s) (LRB): LUMBER DECOMPRESSION L4-5/ L5-S1 BILATERALLY   (Bilateral) Advance diet Up with therapy D/C IV fluids  Discussed D/C instructions, dressing instructions, Lspine precautions D/C home today  BISSELL, JACLYN M. 01/01/2016, 9:43 AM

## 2016-01-01 NOTE — Progress Notes (Signed)
Patient d/c home,stable. 

## 2016-01-01 NOTE — Evaluation (Signed)
Occupational Therapy Evaluation Patient Details Name: Howard FowlerJohn R [name] MRN: [id number] DOB: [birth year] Today's Date: 01/01/2016    History of Present Illness 50 year old man admitted for L4-5 decompression and L5-S1 hemilaminectomy and decompression.  H/o bil THA   Clinical Impression   Pt was admitted for the above sx. All education was completed. No further OT is needed at this time    Follow Up Recommendations  No OT follow up    Equipment Recommendations  None recommended by OT    Recommendations for Other Services       Precautions / Restrictions Precautions Precautions: Back Restrictions Weight Bearing Restrictions: No      Mobility Bed Mobility Overal bed mobility: Needs Assistance Bed Mobility: Rolling;Sit to Sidelying Rolling: Min assist       Sit to sidelying: Min assist General bed mobility comments: assist to avoid twisting and light assistance for legs  Transfers Overall transfer level: Needs assistance Equipment used: Rolling walker (2 wheeled) Transfers: Sit to/from Stand Sit to Stand: Supervision         General transfer comment: cues for back precautions    Balance                                            ADL Overall ADL's : Needs assistance/impaired     Grooming: Supervision/safety;Standing   Upper Body Bathing: Supervision/ safety;Sitting   Lower Body Bathing: Minimal assistance;With adaptive equipment;Sit to/from stand   Upper Body Dressing : Supervision/safety;Sitting   Lower Body Dressing: Minimal assistance;Sit to/from stand;Adhering to back precautions;With adaptive equipment   Toilet Transfer: Supervision/safety;RW (bed)   Toileting- Clothing Manipulation and Hygiene: Minimal assistance;Sit to/from stand         General ADL Comments: pt was in a lot of pain when I arrived and wanted to walk:  this alleviated it a little.  Educated on back precautions and adls:  he has assist at home for a week  plus DME/AE from hip sxs. Showed toilet aide in case he needs this.  Needs reinforcement with bed mobility--will have PT reinforce this     Vision     Perception     Praxis      Pertinent Vitals/Pain Pain Assessment: 0-10 Pain Score: 7  Pain Location: back Pain Descriptors / Indicators: Aching Pain Intervention(s): Limited activity within patient's tolerance;Monitored during session;Premedicated before session;Repositioned;Patient requesting pain meds-RN notified;RN gave pain meds during session     Hand Dominance     Extremity/Trunk Assessment Upper Extremity Assessment Upper Extremity Assessment: Overall WFL for tasks assessed           Communication Communication Communication: No difficulties   Cognition Arousal/Alertness: Awake/alert Behavior During Therapy: WFL for tasks assessed/performed Overall Cognitive Status: Within Functional Limits for tasks assessed                     General Comments       Exercises       Shoulder Instructions      Home Living Family/patient expects to be discharged to:: Private residence Living Arrangements: Spouse/significant other Available Help at Discharge: Family               Bathroom Shower/Tub: Walk-in Human resources officershower   Bathroom Toilet: Standard     Home Equipment: Environmental consultantWalker - 2 wheels;Bedside commode          Prior Functioning/Environment Level  of Independence: Independent             OT Diagnosis: Acute pain   OT Problem List:     OT Treatment/Interventions:      OT Goals(Current goals can be found in the care plan section) Acute Rehab OT Goals Patient Stated Goal: less pain OT Goal Formulation: All assessment and education complete, DC therapy  OT Frequency:     Barriers to D/C:            Co-evaluation              End of Session    Activity Tolerance: Patient tolerated treatment well Patient left: in bed;with call bell/phone within reach;with nursing/sitter in room   Time:  [id number] OT Time Calculation (min): 17 min Charges:  OT General Charges $OT Visit: 1 Procedure OT Evaluation $OT Eval Low Complexity: 1 Procedure G-Codes: OT G-codes **NOT FOR INPATIENT CLASS** Functional Assessment Tool Used: clinical judgment Functional Limitation: Self care Self Care Current Status ([id number]): At least 1 percent but less than 20 percent impaired, limited or restricted Self Care Goal Status ([id number]): At least 1 percent but less than 20 percent impaired, limited or restricted Self Care Discharge Status [phone]): At least 1 percent but less than 20 percent impaired, limited or restricted  [facility]ENCER,Almedia Cordell 01/01/2016, 8:19 AM Marica OtterMaryellen Corie Allis, OTR/L [phone] 01/01/2016

## 2016-01-01 NOTE — Evaluation (Signed)
Physical Therapy Evaluation Patient Details Name: Howard FowlerJohn R [name] MRN: [id number] DOB: 07-Jan-1966 Today's Date: 01/01/2016   History of Present Illness  50 year old man admitted for L4-5 decompression and L5-S1 hemilaminectomy and decompression.  H/o bil THA  Clinical Impression  Patient evaluated by Physical Therapy with no further acute PT needs identified. All education has been completed and the patient has no further questions. *See below for any follow-up Physical Therapy or equipment needs. PT is signing off. Thank you for this referral.     Follow Up Recommendations No PT follow up    Equipment Recommendations  None recommended by PT    Recommendations for Other Services       Precautions / Restrictions Precautions Precautions: Back      Mobility  Bed Mobility Overal bed mobility: Needs Assistance Bed Mobility: Rolling;Sit to Sidelying;Sidelying to Sit Rolling: Supervision Sidelying to sit: Supervision     Sit to sidelying: Supervision General bed mobility comments: cues for log roll/back precautions  Transfers     Transfers: Sit to/from Stand Sit to Stand: Supervision         General transfer comment: cues for back precautions  Ambulation/Gait Ambulation/Gait assistance: Supervision;Modified independent (Device/Increase time) Ambulation Distance (Feet): 180 Feet Assistive device: None Gait Pattern/deviations: Step-through pattern     General Gait Details: cues for posture, pt with increased lumbar lordosis  Stairs            Wheelchair Mobility    Modified Rankin (Stroke Patients Only)       Balance Overall balance assessment: No apparent balance deficits (not formally assessed)                                           Pertinent Vitals/Pain Pain Assessment: 0-10 Pain Score: 5  Pain Location: back Pain Descriptors / Indicators: Aching Pain Intervention(s): Limited activity within patient's tolerance;Monitored  during session;Repositioned;Premedicated before session    Home Living Family/patient expects to be discharged to:: Private residence Living Arrangements: Spouse/significant other Available Help at Discharge: Family Type of Home: House Home Access: Level entry     Home Layout: One level Home Equipment: Environmental consultantWalker - 2 wheels;Bedside commode      Prior Function Level of Independence: Independent               Hand Dominance        Extremity/Trunk Assessment   Upper Extremity Assessment: Defer to OT evaluation;Overall WFL for tasks assessed           Lower Extremity Assessment: Overall WFL for tasks assessed         Communication   Communication: No difficulties  Cognition Arousal/Alertness: Awake/alert Behavior During Therapy: WFL for tasks assessed/performed Overall Cognitive Status: Within Functional Limits for tasks assessed                      General Comments      Exercises        Assessment/Plan    PT Assessment Patent does not need any further PT services  PT Diagnosis Difficulty walking   PT Problem List    PT Treatment Interventions     PT Goals (Current goals can be found in the Care Plan section) Acute Rehab PT Goals PT Goal Formulation: All assessment and education complete, DC therapy    Frequency     Barriers to discharge  Co-evaluation               End of Session   Activity Tolerance: Patient tolerated treatment well Patient left: in chair;with call bell/phone within reach;with family/visitor present      Functional Assessment Tool Used: clinical judgement Functional Limitation: Mobility: Walking and moving around Mobility: Walking and Moving Around Current Status ([id number]): At least 1 percent but less than 20 percent impaired, limited or restricted Mobility: Walking and Moving Around Goal Status [phone]): At least 1 percent but less than 20 percent impaired, limited or restricted Mobility: Walking and  Moving Around Discharge Status [phone]): At least 1 percent but less than 20 percent impaired, limited or restricted    Time: 0944-1000 PT Time Calculation (min) (ACUTE ONLY): 16 min   Charges:   PT Evaluation $PT Eval Low Complexity: 1 Procedure     PT G Codes:   PT G-Codes **NOT FOR INPATIENT CLASS** Functional Assessment Tool Used: clinical judgement Functional Limitation: Mobility: Walking and moving around Mobility: Walking and Moving Around Current Status ([phone](G8978): At least 1 percent but less than 20 percent impaired, limited or restricted Mobility: Walking and Moving Around Goal Status [phone]): At least 1 percent but less than 20 percent impaired, limited or restricted Mobility: Walking and Moving Around Discharge Status [phone]([phone]): At least 1 percent but less than 20 percent impaired, limited or restricted    [facility] Medical CenterWILLIAMS,Howard Gilbert 01/01/2016, 12:06 PM

## 2016-01-01 NOTE — Progress Notes (Signed)
Patient;s d/c instructions rendered, verbalized understanding. Mentioned re: follow-up appointment. Precautions to to take after his surgery example, no bending, twisting etc. All questions answered appropriately. Prescriptions given. Patient is stable.

## 2016-01-01 NOTE — Discharge Summary (Signed)
Physician Discharge Summary   Patient ID: Howard [name] MRN: [id number] DOB/AGE: [birth year] 50 y.o.  Admit date: 12/31/2015 Discharge date: 01/01/2016  Primary Diagnosis:   STENOSIS L4-5  Admission Diagnoses:  Past Medical History:  Diagnosis Date  . Alcohol abuse   . Anxiety   . Chronic back pain   . Chronic pain   . Osteoarthritis   . Psoriasis    Discharge Diagnoses:   Principal Problem:   Spinal stenosis of lumbar region  Procedure:  Procedure(s) (LRB): LUMBER DECOMPRESSION L4-5/ L5-S1 BILATERALLY   (Bilateral)   Consults: None  HPI:  see H&P    Laboratory Data: Hospital Outpatient Visit on 12/24/2015  Component Date Value Ref Range Status  . Sodium 12/24/2015 137  135 - 145 mmol/L Final  . Potassium 12/24/2015 4.0  3.5 - 5.1 mmol/L Final  . Chloride 12/24/2015 104  101 - 111 mmol/L Final  . CO2 12/24/2015 27  22 - 32 mmol/L Final  . Glucose, Bld 12/24/2015 110* 65 - 99 mg/dL Final  . BUN 12/24/2015 14  6 - 20 mg/dL Final  . Creatinine, Ser 12/24/2015 0.66  0.61 - 1.24 mg/dL Final  . Calcium 12/24/2015 8.9  8.9 - 10.3 mg/dL Final  . GFR calc non Af Amer 12/24/2015 >60  >60 mL/min Final  . GFR calc Af Amer 12/24/2015 >60  >60 mL/min Final   Comment: (NOTE) The eGFR has been calculated using the CKD EPI equation. This calculation has not been validated in all clinical situations. eGFR's persistently <60 mL/min signify possible Chronic Kidney Disease.   . Anion gap 12/24/2015 6  5 - 15 Final  . WBC 12/24/2015 6.2  4.0 - 10.5 K/uL Final  . RBC 12/24/2015 4.26  4.22 - 5.81 MIL/uL Final  . Hemoglobin 12/24/2015 14.5  13.0 - 17.0 g/dL Final  . HCT 12/24/2015 42.6  39.0 - 52.0 % Final  . MCV 12/24/2015 100.0  78.0 - 100.0 fL Final  . MCH 12/24/2015 34.0  26.0 - 34.0 pg Final  . MCHC 12/24/2015 34.0  30.0 - 36.0 g/dL Final  . RDW 12/24/2015 13.8  11.5 - 15.5 % Final  . Platelets 12/24/2015 185  150 - 400 K/uL Final  . ABO/RH(D) 12/31/2015 A NEG   Final    . Antibody Screen 12/31/2015 NEG   Final  . Sample Expiration 12/31/2015 01/03/2016   Final  . Extend sample reason 12/31/2015 NO TRANSFUSIONS OR PREGNANCY IN THE PAST 3 MONTHS   Final  . MRSA, PCR 12/24/2015 NEGATIVE  NEGATIVE Final  . Staphylococcus aureus 12/24/2015 POSITIVE* NEGATIVE Final   Comment:        The Xpert SA Assay (FDA approved for NASAL specimens in patients over 32 years of age), is one component of a comprehensive surveillance program.  Test performance has been validated by [facility] for patients greater than or equal to 23 year old. It is not intended to diagnose infection nor to guide or monitor treatment.    No results for input(s): HGB in the last 72 hours. No results for input(s): WBC, RBC, HCT, PLT in the last 72 hours. No results for input(s): NA, K, CL, CO2, BUN, CREATININE, GLUCOSE, CALCIUM in the last 72 hours. No results for input(s): LABPT, INR in the last 72 hours.  X-Rays:Dg Lumbar Spine 2-3 Views  Result Date: 12/24/2015 CLINICAL DATA:  50 year old male with lumbar stenosis. Preoperative exam. Subsequent encounter. EXAM: LUMBAR SPINE - 2-3 VIEW COMPARISON:  Postmyelogram CT 11/19/2015. FINDINGS: Level assignment  as on prior postmyelogram CT with last fully open disc space labeled L5-S1. Disc space narrowing most notable L4-5 and L5-S1. Facet degenerative changes. Scoliosis thoracic and lumbar spine convex left. Prior bilateral hip replacements. Vascular calcifications. IMPRESSION: Level assignment as on prior postmyelogram CT with last fully open disc space labeled L5-S1. Disc space narrowing most notable L4-5 and L5-S1. Scoliosis thoracic and lumbar spine convex left. Aortic atherosclerosis. Electronically Signed   By: Genia Del M.D.   On: 12/24/2015 12:31   Dg Spine Portable 1 View  Result Date: 12/31/2015 CLINICAL DATA:  Intraoperative localization for decompression at L4-5 EXAM: PORTABLE SPINE - 1 VIEW COMPARISON:  Plain film from earlier in  the same day FINDINGS: Surgical retractors are again noted. Surgical instrument is noted within the posterior aspect of the L4-5 disc space as well as in the spinal canal just above and just below the disc space. The numbering nomenclature is similar to that used on recent exams. IMPRESSION: Intraoperative localization at L4-5. Electronically Signed   By: Inez Catalina M.D.   On: 12/31/2015 10:27   Dg Spine Portable 1 View  Result Date: 12/31/2015 CLINICAL DATA:  L4-5 lumbar decompression EXAM: PORTABLE SPINE - 1 VIEW COMPARISON:  Study obtained earlier in the day FINDINGS: Cross-table lateral lumbar image labeled #2 submitted. Metallic probe tips are posterior to midportion of the L4 vertebral body and the L5-S1 interspace level respectively. Cutting tools overlie the L4 and L5 spinous processes. There is moderate disc space narrowing at L4-5 and L5-S1. IMPRESSION: Metallic probe tips are posterior to the mid L4 vertebral body and L5-S1 interspace levels respectively. There is disc space narrowing at L4-5 and L5-S1. Electronically Signed   By: Lowella Grip III M.D.   On: 12/31/2015 09:27   Dg Spine Portable 1 View  Result Date: 12/31/2015 CLINICAL DATA:  Lumbar decompression L4-5 EXAM: PORTABLE SPINE - 1 VIEW COMPARISON:  Lumbar radiographs December 24, 2015 FINDINGS: Cross-table lateral lumbar image labeled #1 submitted. Metallic probe tips are posterior to the L4 and L5 spinous processes respectively. There is moderate disc space narrowing at L4-5 L5-S1. No fracture or spondylolisthesis. IMPRESSION: Metallic probe tips are posterior to the L4 and L5 spinous processes respectively. Disc space narrowing noted at L4-5 and L5-S1. Electronically Signed   By: Lowella Grip III M.D.   On: 12/31/2015 09:16    EKG: Orders placed or performed during the hospital encounter of 08/27/14  . EKG 12-Lead  . EKG 12-Lead  . EKG 12-Lead     Hospital Course: Patient was admitted to [facility] and  taken to the OR and underwent the above state procedure without complications.  Patient tolerated the procedure well and was later transferred to the recovery room and then to the orthopaedic floor for postoperative care.  They were given PO and IV analgesics for pain control following their surgery.  They were given 24 hours of postoperative antibiotics.   PT was consulted postop to assist with mobility and transfers.  The patient was allowed to be WBAT with therapy and was taught back precautions. Discharge planning was consulted to help with postop disposition and equipment needs.  Patient had a good night on the evening of surgery and started to get up OOB with therapy on day one. Patient was seen in rounds and was ready to go home on day one.  They were given discharge instructions and dressing directions.  They were instructed on when to follow up in the office with Dr.  Beane.   Diet: Regular diet Activity:WBAT Follow-up:in 10-14 days Disposition - Home Discharged Condition: good   Discharge Instructions    Call MD / Call 911    Complete by:  As directed   If you experience chest pain or shortness of breath, CALL 911 and be transported to the hospital emergency room.  If you develope a fever above 101 F, pus (white drainage) or increased drainage or redness at the wound, or calf pain, call your surgeon's office.   Constipation Prevention    Complete by:  As directed   Drink plenty of fluids.  Prune juice may be helpful.  You may use a stool softener, such as Colace (over the counter) 100 mg twice a day.  Use MiraLax (over the counter) for constipation as needed.   Diet - low sodium heart healthy    Complete by:  As directed   Increase activity slowly as tolerated    Complete by:  As directed       Medication List    STOP taking these medications   HYDROcodone-acetaminophen 5-325 MG tablet Commonly known as:  NORCO/VICODIN     TAKE these medications   aspirin EC 81 MG tablet Take 1  tablet (81 mg total) by mouth as needed (headache). May resume 4 days post-op What changed:  additional instructions   DULoxetine 30 MG capsule Commonly known as:  CYMBALTA Take 30 mg by mouth daily.   methocarbamol 500 MG tablet Commonly known as:  ROBAXIN Take 1 tablet (500 mg total) by mouth every 6 (six) hours as needed for muscle spasms.   multivitamin with minerals tablet Take 1 tablet by mouth daily.   oxyCODONE-acetaminophen 5-325 MG tablet Commonly known as:  PERCOCET Take 1-2 tablets by mouth every 4 (four) hours as needed for severe pain.   polyethylene glycol packet Commonly known as:  MIRALAX / GLYCOLAX Take 17 g by mouth daily.   traMADol 50 MG tablet Commonly known as:  ULTRAM Take 1 tablet (50 mg total) by mouth every 6 (six) hours as needed.      Follow-up Information    BEANE,JEFFREY C, MD Follow up in 2 week(s).   Specialty:  Orthopedic Surgery Contact information: [address] [place] [address] [phone]           Signed: Lacie Draft, PA-C Orthopaedic Surgery 01/01/2016, 9:45 AM

## 2016-06-05 ENCOUNTER — Encounter (HOSPITAL_COMMUNITY): Payer: Self-pay | Admitting: *Deleted

## 2016-06-05 ENCOUNTER — Ambulatory Visit (HOSPITAL_COMMUNITY)
Admission: EM | Admit: 2016-06-05 | Discharge: 2016-06-05 | Disposition: A | Discharge status: Home / Self Care | Payer: Managed Care, Other (non HMO) | Attending: Family Medicine | Admitting: Family Medicine

## 2016-06-05 DIAGNOSIS — S61214A Laceration without foreign body of right ring finger without damage to nail, initial encounter: Secondary | ICD-10-CM | POA: Diagnosis not present

## 2016-06-05 MED ORDER — LIDOCAINE-EPINEPHRINE-TETRACAINE (LET) SOLUTION
3.0000 mL | Freq: Once | NASAL | Status: AC
  Administered 2016-06-05: 3 mL via TOPICAL

## 2016-06-05 MED ORDER — LIDOCAINE-EPINEPHRINE-TETRACAINE (LET) SOLUTION
NASAL | Status: AC
  Filled 2016-06-05: qty 3

## 2016-06-05 NOTE — Discharge Instructions (Signed)
Return or see your primary care doctor in 7 days for suture removal

## 2016-06-05 NOTE — ED Triage Notes (Signed)
Pt   r   Ring  Finger   sustained  A  Pinch   Laceration    Which  Involved  Metal  To  Metal    Involvement      Bleeding   Has   Subsided   And  Is   Controlled  By  A    bandaid

## 2016-06-05 NOTE — ED Provider Notes (Signed)
CSN: [id number]     Arrival date & time 06/05/16  1232 History   First MD Initiated Contact with Patient 06/05/16 1455     Chief Complaint  Patient presents with  . Laceration   (Consider location/radiation/quality/duration/timing/severity/associated sxs/prior Treatment) Patient was cleaning stuff at his building and reports that he got his R ring finger jammed between two metal. Patient reports no pain and has full ROM. His tetanus is up to date (last year).     The history is provided by the patient.  Laceration  Location:  Finger Finger laceration location:  R ring finger Depth:  Cutaneous Quality: straight   Bleeding: controlled with pressure   Time since incident:  2 hours Laceration mechanism:  Metal edge Pain details:    Severity:  No pain Foreign body present:  No foreign bodies   Past Medical History:  Diagnosis Date  . Alcohol abuse   . Anxiety   . Chronic back pain   . Chronic pain   . Osteoarthritis   . Psoriasis    Past Surgical History:  Procedure Laterality Date  . APPENDECTOMY    . LUMBAR LAMINECTOMY/DECOMPRESSION MICRODISCECTOMY Bilateral 12/31/2015   Procedure: LUMBER DECOMPRESSION L4-5/ L5-S1 BILATERALLY  ;  Surgeon: Jene Every, MD;  Location: [facility];  Service: Orthopedics;  Laterality: Bilateral;  . TOTAL HIP ARTHROPLASTY    . TOTAL HIP ARTHROPLASTY Left 08/27/2014   Procedure: LEFT TOTAL HIP ARTHROPLASTY ANTERIOR APPROACH;  Surgeon: Durene Romans, MD;  Location: [facility];  Service: Orthopedics;  Laterality: Left;   History reviewed. No pertinent family history. Social History  Substance Use Topics  . Smoking status: Current Every Day Smoker    Packs/day: 1.00    Years: 32.00    Types: Cigarettes  . Smokeless tobacco: Never Used  . Alcohol use 9.6 oz/week    10 Cans of beer, 6 Glasses of wine per week    Review of Systems  All other systems reviewed and are negative.   Allergies  Patient has no known allergies.  Home Medications    Prior to Admission medications   Medication Sig Start Date End Date Taking? Authorizing Provider  aspirin EC 81 MG tablet Take 1 tablet (81 mg total) by mouth as needed (headache). May resume 4 days post-op 01/01/16   Dorothy Spark, PA-C  DULoxetine (CYMBALTA) 30 MG capsule Take 30 mg by mouth daily.    Historical Provider, MD  methocarbamol (ROBAXIN) 500 MG tablet Take 1 tablet (500 mg total) by mouth every 6 (six) hours as needed for muscle spasms. 12/31/15   Jene Every, MD  Multiple Vitamins-Minerals (MULTIVITAMIN WITH MINERALS) tablet Take 1 tablet by mouth daily.    Historical Provider, MD  oxyCODONE-acetaminophen (PERCOCET) 5-325 MG tablet Take 1-2 tablets by mouth every 4 (four) hours as needed for severe pain. 12/31/15   Jene Every, MD  polyethylene glycol [facility] / GLYCOLAX) packet Take 17 g by mouth daily. 12/31/15   Jene Every, MD  traMADol (ULTRAM) 50 MG tablet Take 1 tablet (50 mg total) by mouth every 6 (six) hours as needed. 12/31/15   Jene Every, MD   Meds Ordered and Administered this Visit   Medications  lidocaine-EPINEPHrine-tetracaine (LET) solution (3 mLs Topical Given 06/05/16 1510)    BP 132/80 (BP Location: Right Arm)   Pulse 78   Temp 98.6 F (37 C)   Resp 18   SpO2 100%  No data found.   Physical Exam  Constitutional: He is oriented to person, place,  and time. He appears well-developed and well-nourished.  Cardiovascular: Normal rate.   Pulmonary/Chest: Effort normal.  Neurological: He is alert and oriented to person, place, and time.  Nursing note and vitals reviewed.   Urgent Care Course     .Marland Kitchen.Laceration Repair Date/Time: 06/05/2016 3:05 PM Performed by: Lucia EstelleZHENG, Hannibal Skalla Authorized by: Elvina SidleLAUENSTEIN, KURT   Consent:    Consent obtained:  Verbal   Consent given by:  Patient   Risks discussed:  Pain, infection and poor wound healing   Alternatives discussed:  No treatment Anesthesia (see MAR for exact dosages):    Anesthesia method:   Topical application   Topical anesthetic:  LET Laceration details:    Location:  Finger   Finger location:  R ring finger   Length (cm):  1.5   Depth (mm):  2 Repair type:    Repair type:  Simple Exploration:    Hemostasis achieved with:  LET   Wound exploration: wound explored through full range of motion     Wound extent: no foreign bodies/material noted and no tendon damage noted   Treatment:    Area cleansed with:  Betadine   Amount of cleaning:  Standard   Irrigation solution:  Sterile saline Skin repair:    Repair method:  Sutures   Suture size:  4-0   Suture technique:  Simple interrupted   Number of sutures:  4 Approximation:    Approximation:  Close   Vermilion border: well-aligned   Post-procedure details:    Dressing:  Sterile dressing   Patient tolerance of procedure:  Tolerated well, no immediate complications    (including critical care time)  Labs Review Labs Reviewed - No data to display  Imaging Review No results found.   MDM   1. Laceration of right ring finger without foreign body without damage to nail, initial encounter    Laceration repaired. See procedure note above. Wound care discussed. Return in 7 days or see primary care doctor in 7 days for suture removal.   Lucia EstelleFeng Adara Kittle, NP 06/05/16 1541

## 2016-10-20 ENCOUNTER — Encounter: Payer: Self-pay | Admitting: Psychology

## 2016-12-20 ENCOUNTER — Ambulatory Visit (HOSPITAL_COMMUNITY)
Admission: RE | Admit: 2016-12-20 | Discharge: 2016-12-20 | Disposition: A | Discharge status: Home / Self Care | Payer: Managed Care, Other (non HMO) | Source: Ambulatory Visit | Attending: Orthopedic Surgery | Admitting: Orthopedic Surgery

## 2016-12-20 ENCOUNTER — Other Ambulatory Visit: Payer: Self-pay | Admitting: Orthopedic Surgery

## 2016-12-20 ENCOUNTER — Encounter (INDEPENDENT_AMBULATORY_CARE_PROVIDER_SITE_OTHER): Payer: Self-pay

## 2016-12-20 ENCOUNTER — Encounter (HOSPITAL_COMMUNITY): Payer: Self-pay

## 2016-12-20 DIAGNOSIS — L0390 Cellulitis, unspecified: Secondary | ICD-10-CM | POA: Insufficient documentation

## 2016-12-20 HISTORY — DX: Unspecified fall, initial encounter: W19.XXXA

## 2016-12-20 HISTORY — DX: Unspecified fracture of lower end of right humerus, initial encounter for closed fracture: S42.401A

## 2016-12-20 MED ORDER — VANCOMYCIN HCL 10 G IV SOLR: 1000.0000 mg | Freq: Once | INTRAVENOUS | Status: DC

## 2016-12-20 MED ORDER — VANCOMYCIN HCL IN DEXTROSE 1-5 GM/200ML-% IV SOLN
1000.0000 mg | Freq: Once | INTRAVENOUS | Status: AC
  Administered 2016-12-20: 1000 mg via INTRAVENOUS
  Filled 2016-12-20: qty 200

## 2016-12-20 MED ORDER — SODIUM CHLORIDE 0.9 % IV SOLN
INTRAVENOUS | Status: AC
  Administered 2016-12-20: 17:00:00 via INTRAVENOUS

## 2016-12-20 NOTE — Discharge Instructions (Signed)
Cellulitis, Adult Cellulitis is a skin infection. The infected area is usually red and sore. This condition occurs most often in the arms and lower legs. It is very important to get treated for this condition. Follow these instructions at home:  Take over-the-counter and prescription medicines only as told by your doctor.  If you were prescribed an antibiotic medicine, take it as told by your doctor. Do not stop taking the antibiotic even if you start to feel better.  Drink enough fluid to keep your pee (urine) clear or pale yellow.  Do not touch or rub the infected area.  Raise (elevate) the infected area above the level of your heart while you are sitting or lying down.  Place warm or cold wet cloths (warm or cold compresses) on the infected area. Do this as told by your doctor.  Keep all follow-up visits as told by your doctor. This is important. These visits let your doctor make sure your infection is not getting worse. Contact a doctor if:  You have a fever.  Your symptoms do not get better after 1-2 days of treatment.  Your bone or joint under the infected area starts to hurt after the skin has healed.  Your infection comes back. This can happen in the same area or another area.  You have a swollen bump in the infected area.  You have new symptoms.  You feel ill and also have muscle aches and pains. Get help right away if:  Your symptoms get worse.  You feel very sleepy.  You throw up (vomit) or have watery poop (diarrhea) for a long time.  There are red streaks coming from the infected area.  Your red area gets larger.  Your red area turns darker. This information is not intended to replace advice given to you by your health care provider. Make sure you discuss any questions you have with your health care provider. Document Released: 10/20/2007 Document Revised: 10/09/2015 Document Reviewed: 03/12/2015 Elsevier Interactive Patient Education  2018 Elsevier  Inc.   Vancomycin injection What is this medicine? VANCOMYCIN Zenaida Niece koe MYE sin) is a glycopeptide antibiotic. It is used to treat certain kinds of bacterial infections. It will not work for colds, flu, or other viral infections. This medicine may be used for other purposes; ask your health care provider or pharmacist if you have questions. COMMON BRAND NAME(S): Vancocin What should I tell my health care provider before I take this medicine? They need to know if you have any of these conditions: -dehydration -hearing loss -kidney disease -other chronic illness -an unusual or allergic reaction to vancomycin, other medicines, foods, dyes, or preservatives -pregnant or trying to get pregnant -breast-feeding How should I use this medicine? This medicine is infused into a vein. It is usually given by a health care provider in a hospital or clinic. If you receive this medicine at home, you will receive special instructions. Take your medicine at regular intervals. Do not take your medicine more often than directed. Take all of your medicine as directed even if you think you are better. Do not skip doses or stop your medicine early. It is important that you put your used needles and syringes in a special sharps container. Do not put them in a trash can. If you do not have a sharps container, call your pharmacist or healthcare provider to get one. Talk to your pediatrician regarding the use of this medicine in children. While this drug may be prescribed for even very young  infants for selected conditions, precautions do apply. Overdosage: If you think you have taken too much of this medicine contact a poison control center or emergency room at once. NOTE: This medicine is only for you. Do not share this medicine with others. What if I miss a dose? If you miss a dose, take it as soon as you can. If it is almost time for your next dose, take only that dose. Do not take double or extra doses. What may  interact with this medicine? -amphotericin B -anesthetics -bacitracin -birth control pills -cisplatin -colistin -diuretics -other aminoglycoside antibiotics -polymyxin B This list may not describe all possible interactions. Give your health care provider a list of all the medicines, herbs, non-prescription drugs, or dietary supplements you use. Also tell them if you smoke, drink alcohol, or use illegal drugs. Some items may interact with your medicine. What should I watch for while using this medicine? Tell your doctor or health care professional if your symptoms do not improve or if you get new symptoms. Your condition and lab work will be monitored while you are taking this medicine. Do not treat diarrhea with over the counter products. Contact your doctor if you have diarrhea that lasts more than 2 days or if it is severe and watery. What side effects may I notice from receiving this medicine? Side effects that you should report to your doctor or health care professional as soon as possible: -allergic reactions like skin rash, itching or hives, swelling of the face, lips, or tongue -breathing difficulty, wheezing -change in amount, color of urine -change in hearing -chest pain -dizziness -fever, chills -flushing of the face and neck (reddening) -low blood pressure -redness, blistering, peeling or loosening of the skin, including inside the mouth -unusual bleeding or bruising -unusually weak or tired Side effects that usually do not require medical attention (report to your doctor or health care professional if they continue or are bothersome): -nausea, vomiting -pain, swelling where injected -stomach cramps This list may not describe all possible side effects. Call your doctor for medical advice about side effects. You may report side effects to FDA at 1-800-FDA-1088. Where should I keep my medicine? Keep out of the reach of children. You will be instructed on how to store this  medicine, if needed. Throw away any unused medicine after the expiration date on the label. NOTE: This sheet is a summary. It may not cover all possible information. If you have questions about this medicine, talk to your doctor, pharmacist, or health care provider.  2018 Elsevier/Gold Standard (2012-12-08 14:46:02)

## 2016-12-20 NOTE — Progress Notes (Unsigned)
Patient presents to our office setting s/p fall last Thursday. He complains of pain to the right elbow with progressive swelling and erythema. Patient was seen a colleague today for chronic back pain when he mentioned the right upper extremity. We were asked to see the patient. His findings are consistent with bursitis and cellulitis, possible surgical neck fracture.  Patient was splinted and Rx for po antibiotics were given. In addition outpatient vancomycin will be administrated. We will recheck him tomorrow morning.

## 2016-12-21 ENCOUNTER — Ambulatory Visit: Payer: Self-pay | Admitting: Orthopedic Surgery

## 2016-12-21 ENCOUNTER — Ambulatory Visit (HOSPITAL_COMMUNITY)
Admission: RE | Admit: 2016-12-21 | Discharge: 2016-12-21 | Disposition: A | Discharge status: Home / Self Care | Payer: Managed Care, Other (non HMO) | Source: Ambulatory Visit | Attending: Orthopedic Surgery | Admitting: Orthopedic Surgery

## 2016-12-21 ENCOUNTER — Inpatient Hospital Stay (HOSPITAL_COMMUNITY): Admission: RE | Admit: 2016-12-21 | Payer: Self-pay | Source: Ambulatory Visit

## 2016-12-21 DIAGNOSIS — L0390 Cellulitis, unspecified: Secondary | ICD-10-CM | POA: Insufficient documentation

## 2016-12-21 MED ORDER — VANCOMYCIN HCL IN DEXTROSE 1-5 GM/200ML-% IV SOLN
1000.0000 mg | Freq: Once | INTRAVENOUS | Status: AC
Start: 2016-12-21 — End: 2016-12-21
  Administered 2016-12-21: 1000 mg via INTRAVENOUS
  Filled 2016-12-21: qty 200

## 2016-12-21 NOTE — Progress Notes (Signed)
Pt received IV infusion of vancomycin infused over 1 hour; no complications noted  Ordering Provider: Dominica SeverinGramig, William MD  Diagnosis: Cellulitis  Pt discharged home with no complications noted

## 2016-12-21 NOTE — Discharge Instructions (Signed)

## 2017-01-25 ENCOUNTER — Ambulatory Visit (INDEPENDENT_AMBULATORY_CARE_PROVIDER_SITE_OTHER): Payer: Managed Care, Other (non HMO) | Admitting: Psychology

## 2017-01-25 DIAGNOSIS — IMO0002 Reserved for concepts with insufficient information to code with codable children: Secondary | ICD-10-CM

## 2017-01-25 DIAGNOSIS — R4189 Other symptoms and signs involving cognitive functions and awareness: Secondary | ICD-10-CM | POA: Diagnosis not present

## 2017-01-25 DIAGNOSIS — F419 Anxiety disorder, unspecified: Secondary | ICD-10-CM

## 2017-01-25 DIAGNOSIS — F1099 Alcohol use, unspecified with unspecified alcohol-induced disorder: Secondary | ICD-10-CM

## 2017-01-25 NOTE — Progress Notes (Signed)
NEUROPSYCHOLOGICAL INTERVIEW (CPT: [id number])  Name: Howard [name] Date of Birth: 06/17/1965 Date of Interview: 01/25/2017  Reason for Referral:  Howard [name] is a 51 y.o. male who is referred for neuropsychological evaluation by Dr. Raina Mina of [facility] due to concerns about reduced attention span and reduced reading comprehension, worsening over time. This patient is unaccompanied in the office for today's appointment.  History of Presenting Problem:  Howard [name] reported onset of cognitive changes approximately 1 to 1 1/2 years ago, which have progressively worsened and have become more concerning to him in the past 6-7 months. He reported that he is an avid reader but is having difficulty with comprehension and has to re-read things. He will get up to go to a task and as he is walking out of his office he forgets what he was about to do. From time to time when he is driving he has difficulty figuring out the best route to get somewhere. He has trouble remembering things his wife and customers have told him. He is having to do a lot of note-taking, and he used to be very good at memorization. He is having trouble concentrating.  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 He denies getting lost when driving, missing appointments, or forgetting to take medications. His wife has always managed the finances/bill paying.  There is no family history of dementia.   He reports a history of significant depression and anxiety. He states he has never seen a psychiatrist or psychologist for this but his PCP has prescribed medication. He reported a history of suicidal ideation and states "I checked myself in to [facility] when I had that." He states he thought he had a drinking problem at the time, too. He states this was about 3 years ago when he went to [facility], and that he was told that he actually didn't have a drinking  problem because he had no withdrawal symptoms.  I see in his Epic records that he presented to the [facility] ED on 06/04/2015 with suicidal ideation and alcohol intoxication. He reported he had put a pistol in his mouth the week before but decided not to go through with it. He was admitted to [facility]. Per H&P, he reported he had been drinking too much for the past 10 years. He reported drinking every day (3-5 beers plus 3-5 glasses of wine after work during the week; 14-15 beers and wine on the weekends). He felt that his drinking was affecting his job ("can't think straight anymore") and his relationship with his wife. It is noted that his PCP was prescribing Xanax 0.5 mg TID and decreased it to 0.25 mg TID and the patient increased alcohol intake to compensate for decrease in Xanax. Notes also indicate a history of 2 DWI's. While in [facility], he was started on Lexapro 10 mg and Trazodone 100 mg. He was discharged the following day from [facility] as he completed detox treatment and his mood was stable. He was discharged to [facility] to continue substance abuse treatment.   The patient denies suicidal ideation or intention at the present time. He reported that he used to take tramadol (for about 8-9 months) but "I think it messed me up" because when he stopped taking it, he stopped experiencing suicidal thoughts. He does take Xanax 2-4 times daily for anxiety, as well as duloxetine (was on Lexapro previously) for depression. He takes percocet every  5-6 hours as needed for chronic pain (reports he has had multiple orthopedic operations including hip implants and back surgery a year ago).   He reports that he continues to drink alcohol on a daily basis. He reported he drinks two beers and a glass of wine every weeknight and approximately 9 beers a day on the weekend.   He reported sleep difficulty due to his chronic pain and cramping. He is preparing to have spinal stimulator placement for pain.  He has never had a sleep test but thinks he might have sleep apnea as he snores loudly.   Howard [name] states that recently his mood has been good and he feels like duloxetine is working. On a scale of 0-10, he reports his mood as 8.5. He does report ongoing anxiety. He reported that he frequently gets shaky due to anxiety, and almost passes out. He reported he has a high pressure job.  Family history is reportedly positive for anxiety (father) and alcohol problems (siblings).    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 No children. Alcohol: Daily (see above) Tobacco: Daily smoker, smokes 1 ppd (since age 51), has tried quitting in past, longest time was 4-5 mos. Is interested in quitting but says he smokes now due to pain. Denied illicit drug use.   Medical History: Past Medical History:  Diagnosis Date  . Alcohol abuse   . Anxiety   . Chronic back pain   . Chronic pain   . Fall   . Fracture of right elbow   . Osteoarthritis   . Psoriasis      Current Medications:  Meclizine HCl 25 mg, one tablet every 6 hours PRN Alprazolam 0.5 mg, one tablet four times daily PRN Duloxetine HCl 30 mg, one tablet daily Nexium 40 mg, one tablet daily Aspirin 81 mg, one tablet daily Percocet dosage unknown, every 5-6 hours PRN (per patient)   Behavioral Observations:   Appearance: Neatly and appropriately dressed and groomed. Shifts position in seat frequently, likely due to combination of pain and anxiety. Gait: Ambulated independently, no gross abnormalities observed Speech: Fluent; normal rate, rhythm and volume. No significant word finding difficulty. Thought process: Generally linear Affect: Full, anxious, evidence of underlying depression despite very bright  affect. Interpersonal: Pleasant, engaged, somewhat evasive with possible minimization of symptoms/substance use.   TESTING: There is medical necessity to proceed with neuropsychological assessment as the results will be used to aid in differential diagnosis and clinical decision-making and to inform specific treatment recommendations. Per the patient and medical records reviewed, there has been a change in cognitive functioning and a reasonable suspicion of neurocognitive disorder. I suspect his cognitive complaints are secondary to substance abuse, depression and anxiety.   PLAN: The patient will return for a full battery of neuropsychological testing with a psychometrician under my supervision. Education regarding testing procedures was provided. Subsequently, the patient will see this provider for a follow-up session at which time his test performances and my impressions and treatment recommendations will be reviewed in detail.  Full neuropsychological evaluation report to follow.

## 2017-01-26 ENCOUNTER — Encounter: Payer: Self-pay | Admitting: Psychology

## 2017-02-01 ENCOUNTER — Ambulatory Visit (INDEPENDENT_AMBULATORY_CARE_PROVIDER_SITE_OTHER): Payer: Managed Care, Other (non HMO) | Admitting: Psychology

## 2017-02-01 DIAGNOSIS — R4189 Other symptoms and signs involving cognitive functions and awareness: Secondary | ICD-10-CM | POA: Diagnosis not present

## 2017-02-01 NOTE — Progress Notes (Signed)
   Neuropsychology Note  Elex Mainwaring Trainer returned today for 3 hours of neuropsychological testing with technician, Wallace Keller, BS, under the supervision of Dr. Elvis Coil. The patient did not appear overtly distressed by the testing session, per behavioral observation or via self-report to the technician. Rest breaks were offered. Howard [name] will return within 2 weeks for a feedback session with Dr. Alinda Dooms at which time his test performances, clinical impressions and treatment recommendations will be reviewed in detail. The patient understands he can contact our office should he require our assistance before this time.  Full report to follow.

## 2017-02-09 NOTE — Progress Notes (Signed)
NEUROPSYCHOLOGICAL EVALUATION   Name:    Howard [name]  Date of Birth:   1966/04/02 Date of Interview:  01/25/2017 Date of Testing:  02/01/2017   Date of Feedback:  02/10/2017       Background Information:  Reason for Referral:  Howard [name] is a 51 y.o. male referred by Dr. Raina Mina of [facility] to assess his current level of cognitive functioning and assist in differential diagnosis. Howard current evaluation consisted of a review of available medical records, an interview with Howard [name] and Howard completion of a neuropsychological testing battery. Informed consent was obtained.  History of Presenting Problem:  Howard [name] reported onset of cognitive changes approximately 1 to 1 1/2 years ago, which have progressively worsened and have become more concerning to him in Howard past 6-7 months. He reported that he is an avid reader but is having difficulty with comprehension and has to re-read things. He will get up to go to a task and as he is walking out of his office he forgets what he was about to do. From time to time when he is driving he has difficulty figuring out Howard best route to get somewhere. He has trouble remembering things his wife and customers have told him. He is having to do a lot of note-taking, and he used to be very good at memorization. He is having trouble concentrating.  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 He denies getting lost when driving, missing appointments, or forgetting to take medications. His wife has always managed Howard finances/bill paying.  There is no family history of dementia.   He reports a history of significant depression and anxiety. He states he has never seen a psychiatrist or psychologist for this but his PCP has prescribed medication. He reported a history of suicidal ideation and states "I checked myself in to [facility] when I had that." He states he thought he  had a drinking problem at Howard time, too. He states this was about 3 years ago when he went to [facility], and that he was told that he actually didn't have a drinking problem because he had no withdrawal symptoms.  I see in his Epic records that he presented to [facility] ED on 06/04/2015 with suicidal ideation and alcohol intoxication. He reported he had put a pistol in his mouth Howard week before but decided not to go through with it. He was admitted to [facility]. Per H&P, he reported he had been drinking too much for Howard past 10 years. He reported drinking every day (3-5 beers plus 3-5 glasses of wine after work during Howard week; 14-15 beers and wine on Howard weekends). He felt that his drinking was affecting his job ("can't think straight anymore") and his relationship with his wife. It is noted that his PCP was prescribing Xanax 0.5 mg TID and decreased it to 0.25 mg TID and Howard [name] increased alcohol intake to compensate for decrease in Xanax. Notes also indicate a history of 2 DWI's. While in [facility], he was started on Lexapro 10 mg and Trazodone 100 mg. He was discharged Howard following day from [facility] as he completed detox treatment and his mood was stable. He was discharged to [facility] to continue substance abuse treatment.   Howard [name] denies suicidal ideation or intention at Howard present time. He reported that he used to take tramadol (for about 8-9 months) but "I  think it messed me up" because when he stopped taking it, he stopped experiencing suicidal thoughts. He does take Xanax 2-4 times daily for anxiety, as well as duloxetine (was on Lexapro previously) for depression. He takes percocet every 5-6 hours as needed for chronic pain (reports he has had multiple orthopedic operations including hip implants and back surgery a year ago).   He reports that he continues to drink alcohol on a daily basis. He reported he drinks two beers and a glass of wine every weeknight and  approximately 9 beers a day on Howard weekend.   He reported sleep difficulty due to his chronic pain and cramping. He is preparing to have spinal stimulator placement for pain. He has never had a sleep test but thinks he might have sleep apnea as he snores loudly.   Howard [name] states that recently his mood has been good and he feels like duloxetine is working. On a scale of 0-10, he reports his mood as 8.5. He does report ongoing anxiety. He reported that he frequently gets shaky due to anxiety, and almost passes out. He reported he has a high pressure job.  Family history is reportedly positive for anxiety (father) and alcohol problems (siblings).    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 No children. Alcohol: Daily (see above) Tobacco: Daily smoker, smokes 1 ppd (since age 72), has tried quitting in past, longest time was 4-5 mos. Is interested in quitting but says he smokes now due to pain. Denied illicit drug use.    Medical History:  Past Medical History:  Diagnosis Date  . Alcohol abuse   . Anxiety   . Chronic back pain   . Chronic pain   . Fall   . Fracture of right elbow   . Osteoarthritis   . Psoriasis     Current Medications:  Meclizine HCl 25 mg, one tablet every 6 hours PRN Alprazolam 0.5 mg, one tablet four times daily PRN Duloxetine HCl 30 mg, one tablet daily Nexium 40 mg, one tablet daily Aspirin 81 mg, one tablet daily Percocet dosage unknown, every 5-6 hours PRN (per [name])   Current Examination:  Behavioral Observations:  Appearance: Neatly and appropriately dressed and groomed. Shifts position in seat frequently, likely due to combination of pain and anxiety. Gait: Ambulated independently, no gross abnormalities observed Speech: Fluent;  normal rate, rhythm and volume. No significant word finding difficulty. Thought process: Generally linear Affect: Full, anxious, evidence of underlying depression despite very bright affect. Interpersonal: Pleasant, engaged, somewhat evasive with possible minimization of symptoms/substance use. Orientation: Oriented to all person, place and most aspects of time (2 days off on Howard current date). Accurately named Howard current President and his predecessor.   Tests Administered: . Test of Premorbid Functioning (TOPF) . Wechsler Adult Intelligence Scale-Fourth Edition (WAIS-IV): Similarities, Information, Block Design, Matrix Reasoning, Arithmetic, Symbol Search, Coding and Digit Span subtests . Wechsler Memory Scale-Fourth Edition (WMS-IV) Adult Version (ages 19-69): Logical Memory I, II and Recognition subtests  . [place] Verbal Learning Test - 2nd Edition (CVLT-2) Standard Form . Repeatable Battery for Howard Assessment of Neuropsychological Status (RBANS) Form A:  Figure Copy and Figure Recall subtests . Neuropsychological Assessment Battery (NAB) Language Module, Form 1: Naming subtest . Controlled Oral Word Association Test (COWAT) . Trail Making Test A and  B . Boston Diagnostic Aphasia Examination (BDAE): Commands Subtest . Beck Depression Inventory - Second edition (BDI-II) . Generalized Anxiety Disorder - 7 item screener (GAD-7) . Personality Assessment Inventory (PAI)  Test Results: Note: Standardized scores are presented only for use by appropriately trained professionals and to allow for any future test-retest comparison. These scores should not be interpreted without consideration of all Howard information that is contained in Howard rest of Howard report. Howard most recent standardization samples from Howard test publisher or other sources were used whenever possible to derive standard scores; scores were corrected for age, gender, ethnicity and education when available.   Test Scores:  Test Name  Raw Score Standardized Score Descriptor  TOPF 39/70 SS= 97 Average  WAIS-IV Subtests     Similarities 28/36 ss= 11 Average  Information 12/26 ss= 9 Average  Block Design 33/66 ss= 8 Low end of average  Matrix Reasoning 10/26 ss= 6 Low average  Arithmetic 10/22 ss= 7 Low average  Symbol Search 24/60 ss= 7 Low average  Coding 48/135 ss= 7 Low average  Digit Span 32/48 ss= 12 High average  WAIS-IV Index Scores     Verbal Comprehension  SS= 100 Average  Perceptual Reasoning  SS= 82 Low average  Working Memory  SS= 97 Average  Processing Speed  SS= 84 Low average  Full Scale IQ (8 subtest)  SS= 89 Low average  WMS-IV Subtests     LM I 24/50 ss= 10 Average  LM II 18/50 ss= 9 Average  LM II Recognition 27/30 Cum %: >75 WNL  CVLT-II Scores     Trial 1 4/16 Z= -1.5 Borderline  Trial 5 5/16 Z= -2.5 Impaired  Trials 1-5 total 28/80 T= 34 Borderline  SD Free Recall 5/16 Z= -1 Low average  SD Cued Recall 8/16 Z= -1 Low average  LD Free Recall 6/16 Z= -1 Low average  LD Cued Recall 10/16 Z= 0 Average  Recognition Discriminability 12/16 hits; 2 false positives Z= -0.5 Average  Forced Choice Recognition 16/16  WNL  RBANS Subtests     Figure Copy 16/20 Z= -1.6 Borderline  Figure Recall 11/20 Z= -0.8 Low average  NAB Language Naming 31/31 T= 54 Average  COWAT-FAS 31 T= 41 Low average  COWAT-Animals 18 T= 46 Average  Trail Making Test A 45" 2 errors T= 34 Borderline  Trail Making Test B  93" 0 errors T= 34 Borderline  BDAE Subtest     Commands 14/15  Below expectation  BDI-II 22/63  Moderate  GAD-7 7/21  Mild  PAI  Only elevated clinical scales are shown here:   SOM  T= 78   ANX  T= 72      Description of Test Results:  Embedded performance validity indicators were within normal limits. As such, Howard [name]'s current performance on neurocognitive testing is judged to be a relatively accurate representation of his current level of neurocognitive functioning.   Premorbid verbal  intellectual abilities were estimated to have been within Howard average range based on a test of word reading. Consistent with this, current verbal IQ (verbal comprehension index of Howard WAIS-IV) fell within Howard average range. His current full scale IQ fell within Howard low average range (bordering on average range). He demonstrated relative strengths in verbal comprehension and working memory, with areas of relative weakness in perceptual reasoning and processing speed.  Psychomotor processing speed was low average. Auditory attention and working memory were average. Visual-spatial construction was somewhat variable. Specifically, manipulation of three dimensional  blocks to match two dimensional stimulus models was low end of average, while his drawn copy of a complex geometric figure was borderline impaired. Language abilities were generally within normal limits. Specifically, confrontation naming was average, and semantic verbal fluency was average. Auditory comprehension of multi-step commands was mildly below expectation. With regard to verbal memory, encoding and acquisition of non-contextual information (i.e., word list) was borderline impaired across five learning trials. After an interference task, free recall was low average (5/16 words recalled. He recalled an additional three words with semantic cueing (low average). After a delay, free recall was low average (6/16 items recalled). Cued recall was average (10/16 items recalled). Performance on a yes/no recognition task was average although he did demonstrate lower than expected recognition of target items. On another verbal memory test, encoding and acquisition of contextual auditory information (i.e., short stories) was average. After a delay, free recall was average. Performance on a yes/no recognition task was normal. With regard to non-verbal memory, delayed free recall of visual information was low average. Executive functioning was somewhat variable.  Mental flexibility and set-shifting were borderline impaired on Trails B; he did not commit any set loss errors but required increased time to complete Howard task. Verbal fluency with phonemic search restrictions was low average. Verbal abstract reasoning was average. Non-verbal abstract reasoning was low average.   On a self-report measure of mood, Howard [name]'s responses were indicative of clinically significant depression at Howard present time. He endorsed passive suicidal ideation with no intention or plan. On a self-report measure of anxiety, Howard [name] endorsed mild generalized anxiety at Howard present time.   On a more extensive measure of psychopathology and personality function (PAI), he produced a valid profile. His PAI clinical profile is marked by significant elevations, indicating Howard presence of clinical features that are likely to be sources of difficulty for Howard [name].  Howard configuration of Howard clinical scales suggests a person who is reporting marked distress, with particular concerns about his physical functioning.  Howard [name] sees his life as severely disrupted by a variety of physical problems, some of which may be stress-related.  These problems have left him tense, unhappy, and have probably impaired his ability to concentrate on or perform important life tasks.   Howard [name] demonstrates an unusual degree of concern about physical functioning and health matters and probable impairment arising from somatic symptoms.  He is likely to report that his daily functioning has been compromised by numerous and varied physical problems.  He feels that his health is not as good as that of his age peers and likely believes that his health problems are complex and difficult to treat successfully.  He reports particular problems with Howard frequent occurrence of various minor physical symptoms (such as headaches, pain, or gastrointestinal problems) and has vague complaints of ill health and fatigue.  He is  likely to be continuously concerned with his health status and physical problems.  His social interactions and conversations tend to focus on his health problems, and his self-image may be largely influenced by a belief that he is handicapped by his poor health. Howard [name] indicates that he is experiencing a discomforting level of anxiety and tension.  He is likely to be plagued by worry to Howard degree that his ability to concentrate and attend are significantly compromised.  Overt physical signs of tension and stress, such as sweaty palms, trembling hands, complaints of irregular heartbeats, and shortness of breath are also present.  However, he does  not report a strong subjective experience of tension or major difficulties relaxing. Howard [name] describes his thought processes as marked by confusion, distractibility, and difficulty concentrating.   Howard [name] reports some difficulties consistent with relatively mild or transient depressive symptomatology.   Howard [name] reports that alcohol use has caused occasional problems in his life.  These problems may involve difficulties in interpersonal relationships, problems on Howard job, and/or Howard use of alcohol to reduce stress. According to Howard [name]'s self-report, he describes NO significant problems in Howard following areas: problems with empathy; undue suspiciousness or hostility; extreme moodiness and impulsivity; unusually elevated mood or heightened activity; problematic behaviors used to manage anxiety. With respect to suicidal ideation, Howard [name] is NOT reporting distress from thoughts of self-harm.   Clinical Impressions: Mild cognitive impairment (most likely due to effects of alcohol, benzodiazepines and opiates as well as depression/anxiety and chronic pain); Major depressive disorder; Anxiety disorder unspecified. Cognitive testing results are positive for mildly slowed processing speed, which likely affects other aspects of cognitive  functioning. Encoding of non-contextual information, visual-spatial construction, and mental flexibility are areas of relative weakness as well.  Slowed processing speed is rather nonspecific but is often present in mood disorders and chronic pain, and Howard [name] does appear to be experiencing depression and anxiety as well as chronic pain.  I am concerned that Howard [name] is using alcohol to self-medicate for anxiety/stress and chronic pain. I am also concerned that his alcohol use and Xanax use may be problematic for him from a neurocognitive perspective. His cognitive profile of mild executive dysfunction and mild problems with visual-spatial construction could represent Howard early stages of a developing alcohol-related dementia. He is also on a high dose of Xanax which can impact cognitive functioning. He certainly does not meet criteria for dementia at this time, but it is likely that his alcohol consumption and Xanax use is having a negative effect on his brain. Additionally, chronic opiate use could be affecting cognitive functioning as well.    Recommendations/Plan: Based on Howard findings of Howard present evaluation, Howard following recommendations are offered:  1. Mental health treatment is highly recommended -- specifically, cognitive behavioral therapy for anxiety and chronic pain, with Howard goal of reducing alcohol use over time as he develops healthier coping skills. He accepted a referral to [facility]. 2. Ideally he would also participate in an addiction program like AA. 3. Education regarding Howard impact of regular/heavy alcohol consumption and chronic benzodiazepine use on brain function was provided.  4. He may wish to pursue psychiatry consultation to review his current psychiatric medication regimen. I am concerned about Howard amount of Alprazolam he is taking per day, especially in combination with daily drinking. I would think an SSRI for anxiety/depression would be a safer  option for him. He notes that he has tried to see a psychiatrist in Howard past but wait times were 6+ months.    Feedback to [name]: Howard [name] and his wife returned for a feedback appointment on 02/10/2017 to review Howard results of his neuropsychological evaluation with this provider. 30 minutes face-to-face time was spent reviewing his test results, my impressions and my recommendations as detailed above.    Total time spent on this [name]'s case: [id number] unit for interview with psychologist; [phone] units of testing by psychometrician under psychologist's supervision; [phone] units for medical record review, scoring of neuropsychological tests, interpretation of test results, preparation of this report, and review of results to Howard [name] by psychologist.  Thank you for your referral of Howard [name]. Please feel free to contact me if you have any questions or concerns regarding this report.

## 2017-02-10 ENCOUNTER — Encounter: Payer: Self-pay | Admitting: Psychology

## 2017-02-10 ENCOUNTER — Ambulatory Visit (INDEPENDENT_AMBULATORY_CARE_PROVIDER_SITE_OTHER): Payer: Managed Care, Other (non HMO) | Admitting: Psychology

## 2017-02-10 DIAGNOSIS — Z79891 Long term (current) use of opiate analgesic: Secondary | ICD-10-CM | POA: Diagnosis not present

## 2017-02-10 DIAGNOSIS — G894 Chronic pain syndrome: Secondary | ICD-10-CM

## 2017-02-10 DIAGNOSIS — F10288 Alcohol dependence with other alcohol-induced disorder: Secondary | ICD-10-CM | POA: Diagnosis not present

## 2017-02-10 DIAGNOSIS — F339 Major depressive disorder, recurrent, unspecified: Secondary | ICD-10-CM | POA: Diagnosis not present

## 2017-02-10 DIAGNOSIS — Z79899 Other long term (current) drug therapy: Secondary | ICD-10-CM | POA: Diagnosis not present

## 2017-02-10 DIAGNOSIS — F32A Depression, unspecified: Secondary | ICD-10-CM

## 2017-02-10 DIAGNOSIS — G3184 Mild cognitive impairment, so stated: Secondary | ICD-10-CM | POA: Diagnosis not present

## 2017-02-10 DIAGNOSIS — IMO0002 Reserved for concepts with insufficient information to code with codable children: Secondary | ICD-10-CM

## 2017-02-10 DIAGNOSIS — F329 Major depressive disorder, single episode, unspecified: Secondary | ICD-10-CM

## 2017-02-10 DIAGNOSIS — R4189 Other symptoms and signs involving cognitive functions and awareness: Secondary | ICD-10-CM

## 2017-02-10 DIAGNOSIS — F419 Anxiety disorder, unspecified: Secondary | ICD-10-CM

## 2017-02-10 DIAGNOSIS — F1099 Alcohol use, unspecified with unspecified alcohol-induced disorder: Secondary | ICD-10-CM

## 2017-02-10 NOTE — Patient Instructions (Signed)
Clinical Impressions: Mild cognitive impairment most likely due to alcohol use, depression/anxiety and chronic pain. Cognitive testing results are positive for mildly slowed processing speed, which likely affects other aspects of cognitive functioning. Encoding of non-contextual information, visual-spatial construction, and mental flexibility are areas of relative weakness as well.  Slowed processing speed is rather nonspecific but is often present in mood disorders and chronic pain, and the patient does appear to be experiencing depression and anxiety as well as chronic pain.  I am concerned that the patient is using alcohol to self-medicate for anxiety/stress and chronic pain. I am also concerned that his alcohol use may be problematic for him from a neurologic perspective. His cognitive profile of mild executive dysfunction and mild problems with visual-spatial construction could represent the early stages of a developing alcohol-related dementia. He certainly does not meet criteria for dementia at this time, but it is likely that his alcohol consumption is having a deleterious effect on his brain.     Recommendations/Plan: Based on the findings of the present evaluation, the following recommendations are offered:  1. Mental health treatment is highly recommended -- specifically, cognitive behavioral therapy for anxiety and chronic pain, with the goal of reducing alcohol use over time as he develops healthier coping skills.  2. Ideally he would also participate in an addiction program like AA. 3. Education regarding the impact of regular/heavy alcohol consumption on brain function was provided.  4. He may wish to pursue psychiatry consultation to review his current psychiatric medication regimen. I am concerned about the amount of Alprazolam he is taking per day, especially in combination with daily drinking. I would think an SSRI for anxiety/depression would be a safer option for him.

## 2017-12-26 DIAGNOSIS — E663 Overweight: Secondary | ICD-10-CM | POA: Diagnosis not present

## 2017-12-26 DIAGNOSIS — J449 Chronic obstructive pulmonary disease, unspecified: Secondary | ICD-10-CM | POA: Diagnosis not present

## 2017-12-26 DIAGNOSIS — G894 Chronic pain syndrome: Secondary | ICD-10-CM | POA: Diagnosis not present

## 2018-01-23 DIAGNOSIS — Z23 Encounter for immunization: Secondary | ICD-10-CM | POA: Diagnosis not present

## 2018-02-07 DIAGNOSIS — M25511 Pain in right shoulder: Secondary | ICD-10-CM | POA: Diagnosis not present

## 2018-02-16 DIAGNOSIS — R945 Abnormal results of liver function studies: Secondary | ICD-10-CM | POA: Diagnosis not present

## 2018-02-16 DIAGNOSIS — R7989 Other specified abnormal findings of blood chemistry: Secondary | ICD-10-CM | POA: Diagnosis not present

## 2018-02-24 DIAGNOSIS — M25511 Pain in right shoulder: Secondary | ICD-10-CM | POA: Diagnosis not present

## 2018-03-03 DIAGNOSIS — M75121 Complete rotator cuff tear or rupture of right shoulder, not specified as traumatic: Secondary | ICD-10-CM | POA: Diagnosis not present

## 2018-03-03 DIAGNOSIS — M19011 Primary osteoarthritis, right shoulder: Secondary | ICD-10-CM | POA: Diagnosis not present

## 2018-03-03 DIAGNOSIS — S43431A Superior glenoid labrum lesion of right shoulder, initial encounter: Secondary | ICD-10-CM | POA: Diagnosis not present

## 2018-03-28 DIAGNOSIS — S46191A Other injury of muscle, fascia and tendon of long head of biceps, right arm, initial encounter: Secondary | ICD-10-CM | POA: Diagnosis not present

## 2018-03-28 DIAGNOSIS — G8918 Other acute postprocedural pain: Secondary | ICD-10-CM | POA: Diagnosis not present

## 2018-03-28 DIAGNOSIS — S46011A Strain of muscle(s) and tendon(s) of the rotator cuff of right shoulder, initial encounter: Secondary | ICD-10-CM | POA: Diagnosis not present

## 2018-03-28 DIAGNOSIS — M19011 Primary osteoarthritis, right shoulder: Secondary | ICD-10-CM | POA: Diagnosis not present

## 2018-04-05 DIAGNOSIS — Z4789 Encounter for other orthopedic aftercare: Secondary | ICD-10-CM | POA: Diagnosis not present

## 2018-04-07 ENCOUNTER — Encounter: Payer: Self-pay | Admitting: Internal Medicine

## 2018-04-24 DIAGNOSIS — S29012A Strain of muscle and tendon of back wall of thorax, initial encounter: Secondary | ICD-10-CM | POA: Diagnosis not present

## 2018-05-19 DIAGNOSIS — M25511 Pain in right shoulder: Secondary | ICD-10-CM | POA: Diagnosis not present

## 2018-05-23 DIAGNOSIS — M25511 Pain in right shoulder: Secondary | ICD-10-CM | POA: Diagnosis not present

## 2018-05-25 DIAGNOSIS — M25511 Pain in right shoulder: Secondary | ICD-10-CM | POA: Diagnosis not present

## 2018-06-01 DIAGNOSIS — M25511 Pain in right shoulder: Secondary | ICD-10-CM | POA: Diagnosis not present

## 2018-06-06 DIAGNOSIS — M25511 Pain in right shoulder: Secondary | ICD-10-CM | POA: Diagnosis not present

## 2018-06-08 DIAGNOSIS — M25511 Pain in right shoulder: Secondary | ICD-10-CM | POA: Diagnosis not present

## 2018-06-13 DIAGNOSIS — M25511 Pain in right shoulder: Secondary | ICD-10-CM | POA: Diagnosis not present

## 2018-06-16 DIAGNOSIS — G894 Chronic pain syndrome: Secondary | ICD-10-CM | POA: Diagnosis not present

## 2018-06-16 DIAGNOSIS — E039 Hypothyroidism, unspecified: Secondary | ICD-10-CM | POA: Diagnosis not present

## 2018-06-19 DIAGNOSIS — R7301 Impaired fasting glucose: Secondary | ICD-10-CM | POA: Diagnosis not present

## 2018-06-19 DIAGNOSIS — Z1389 Encounter for screening for other disorder: Secondary | ICD-10-CM | POA: Diagnosis not present

## 2018-06-19 DIAGNOSIS — I10 Essential (primary) hypertension: Secondary | ICD-10-CM | POA: Diagnosis not present

## 2018-06-19 DIAGNOSIS — Z79899 Other long term (current) drug therapy: Secondary | ICD-10-CM | POA: Diagnosis not present

## 2018-06-19 DIAGNOSIS — R634 Abnormal weight loss: Secondary | ICD-10-CM | POA: Diagnosis not present

## 2018-06-27 ENCOUNTER — Ambulatory Visit: Payer: 59 | Admitting: Podiatry

## 2018-06-27 ENCOUNTER — Encounter: Payer: Self-pay | Admitting: Podiatry

## 2018-06-27 ENCOUNTER — Ambulatory Visit: Payer: Managed Care, Other (non HMO)

## 2018-06-27 ENCOUNTER — Telehealth: Payer: Self-pay | Admitting: *Deleted

## 2018-06-27 VITALS — BP 117/72 | HR 78 | Resp 16

## 2018-06-27 DIAGNOSIS — M722 Plantar fascial fibromatosis: Secondary | ICD-10-CM | POA: Diagnosis not present

## 2018-06-27 DIAGNOSIS — Z01812 Encounter for preprocedural laboratory examination: Secondary | ICD-10-CM

## 2018-06-27 NOTE — Telephone Encounter (Signed)
-----   Message from Kristian Covey, [facility] sent at 06/27/2018  3:59 PM EST ----- Regarding: MRI MRI left foot with contrast - evaluate rheumatoid and plantar fibroma left - surgical consideration   Patient just had BUN and Creatinine done and will bring the facility a copy with him on the day of his appt, per Dr. Al Corpus

## 2018-06-27 NOTE — Telephone Encounter (Signed)
Orders to Tomasa HoseJ. Quintana, RN for pre-cert, [facility]

## 2018-06-28 ENCOUNTER — Telehealth: Payer: Self-pay | Admitting: Podiatry

## 2018-06-28 ENCOUNTER — Encounter: Payer: Self-pay | Admitting: *Deleted

## 2018-06-28 NOTE — Telephone Encounter (Signed)
I informed pt the letter for work was complete and he could pick up the letter and may want to take a copy of his blood work to the MRI appt once scheduled.

## 2018-06-28 NOTE — Progress Notes (Signed)
He presents today for follow-up of his plantar fibromas left foot.  He states the ones of the right foot are just about as painful but my left foot is really killing me.  He says it never really went away with the injections as he refers to him we injected them 5 years ago.  He states that he has history of alcoholism with some numbness in his feet.  He also has rheumatoid arthritis is being treated with Humira however he quit going to rheumatology and quit the Humira a while back because he felt that it was not doing anything.  He states that he is recently had a physical that he seemed to be doing pretty good.  Objective: Vital signs are stable he is alert oriented x3.  Pulses are palpable.  Neurologic sensorium is diminished per Semmes Weinstein monofilament.  Deep tendon reflexes are intact muscle strength is normal symmetrical.  Orthopedic evaluation demonstrates hammertoe deformities #2 #3 of the left foot with medial deviation of all of the toes of the left foot toward the hallux with the hallux is rectus.  Right foot demonstrates rectus foot for the most part.  Severe fibromas or rheumatoid nodules to the plantar medial aspect right foot appears to be the largest.  Left foot multinodular lesions in the plantar fascia along the medial band.  Radiographs confirm hammertoe deformities and fibromas.  Assessment: Severe plantar fibromatosis and digital deformities left greater than right.  Plan: Discussed etiology pathology and surgical therapies at this point is necessary to consider surgical treatment to help straighten the toes manage the pain and rid him of the fibromas.  I am requesting MRI with contrast first and then I will follow-up with him once that has come in.

## 2018-06-28 NOTE — Telephone Encounter (Signed)
Patient needs a Doctors note for work to be able to elevate foot. Stating that I need to stay off foot. Patient will come in to pick up note. Patient dropped Lab work in front, placed in [employer].

## 2018-07-03 ENCOUNTER — Ambulatory Visit: Payer: 59 | Admitting: Nurse Practitioner

## 2018-07-03 ENCOUNTER — Encounter: Payer: Self-pay | Admitting: Nurse Practitioner

## 2018-07-03 ENCOUNTER — Encounter: Payer: Self-pay | Admitting: Internal Medicine

## 2018-07-03 DIAGNOSIS — K838 Other specified diseases of biliary tract: Secondary | ICD-10-CM | POA: Diagnosis not present

## 2018-07-03 DIAGNOSIS — R945 Abnormal results of liver function studies: Secondary | ICD-10-CM

## 2018-07-03 DIAGNOSIS — F1011 Alcohol abuse, in remission: Secondary | ICD-10-CM

## 2018-07-03 DIAGNOSIS — R7989 Other specified abnormal findings of blood chemistry: Secondary | ICD-10-CM | POA: Insufficient documentation

## 2018-07-03 NOTE — Assessment & Plan Note (Signed)
Admits previous alcohol abuse.  He does have a mildly enlarged liver as per physical exam findings.  He is quit drinking as of 03/01/2018.  He was congratulated on this and encouraged to continue to abstain from alcohol given recent abnormalities in his liver testing as well as general overall health recommendations.

## 2018-07-03 NOTE — Patient Instructions (Signed)
Your health issues we discussed today were:   Abnormal liver enzymes/ultrasound: 1. We will repeat some liver labs 2. We will repeat an ultrasound just of your liver (not your entire abdomen) 3. Further recommendations will follow your results  Previous heavy alcohol consumption: 1. Congratulations on quitting drinking in October! 2. You are doing well with this, continue to try to avoid all alcohol to prevent worsening liver problems  Overall I recommend:  1. Follow-up in 2 months 2. Call us if you have any questions or concerns  At [facility] we value your feedback. You may receive a survey about your visit today. Please share your experience as we strive to create trusting relationships with our patients to provide genuine, compassionate, quality care.  We appreciate your understanding and patience as we review any laboratory studies, imaging, and other diagnostic tests that are ordered as we care for you. Our office policy is 5 business days for review of these results, and any emergent or urgent results are addressed in a timely manner for your best interest. If you do not hear from our office in 1 week, please contact us.   We also encourage the use of MyChart, which contains your medical information for your review as well. If you are not enrolled in this feature, an access code is on this after visit summary for your convenience. Thank you for allowing [place] to be involved in your care.  It was great to see you today!  I hope you have a great day!!

## 2018-07-03 NOTE — Progress Notes (Signed)
Primary Care Physician:  Elfredia Nevins, MD Primary Gastroenterologist:  Dr. Jena Gauss  Chief Complaint  Patient presents with  . dilated bile duct    HPI:   Howard [name] is a 53 y.o. male who presents on referral from primary care for dilated bile ducts and transaminitis.  Reviewed information provided with the referral including office visit dated 02/15/2018 which is a chronic follow-up appointment.  There were no overt GI complaints at that time.  Routine labs were completed which showed essentially normal CBC.  CMP with normal creatinine at 0.73, elevated AST/ALT at 69/106, elevated alkaline phosphatase at 160, normal bilirubin at 0.4.  Because of elevated LFTs abdominal ultrasound was completed on 02/16/2018.  Gallbladder with no gallstones or wall thickening, no sonographic Murphy sign.  Common bile duct diameter was measured at 11 mm which is dilated and slightly enlarged compared to a prior exam.  Liver without focal lesion, within normal limits and parenchymal echogenicity, patent portal vein.  No other acute findings.  Recommended consider MRCP for further evaluation.  Most recent CMP in our system dated 06/26/2015 which found elevated AST at 46, normal ALT at 53, normal alkaline phosphatase at 88, normal bilirubin at 1.0.  Just prior to that on 03/27/2015 had transaminitis with AST/ALT both elevated at 51/59, normal alkaline phosphatase at 71, elevated bilirubin at 1.4.  No abdominal imaging found in our system.  Today he states he's doing ok overall. He notes more recent bloodwork (last week) was normal ("everything looked good.") States when he was seen in October he had been drinking a lot. Previous U/S was done in [place], [place]. Denies abdominal pain, N/V, fever, chills, hematochezia, melena. Has lost 20-25 lbs over 4-5 months, since quitting drinking. Was previously on a lot of pain medication but has since been started on Methadone. Denies chest pain, dyspnea, dizziness,  lightheadedness, syncope, near syncope. Denies any other upper or lower GI symptoms.  Has had 2-3 colonoscopy, last about 3-4 years ago which he states was at [facility] (states it was normal).   Past Medical History:  Diagnosis Date  . Alcohol abuse   . Anxiety   . Chronic back pain   . Chronic pain   . Fall   . Fracture of right elbow   . Osteoarthritis   . Psoriasis     Past Surgical History:  Procedure Laterality Date  . APPENDECTOMY    . LUMBAR LAMINECTOMY/DECOMPRESSION MICRODISCECTOMY Bilateral 12/31/2015   Procedure: LUMBER DECOMPRESSION L4-5/ L5-S1 BILATERALLY  ;  Surgeon: Jene Every, MD;  Location: [facility];  Service: Orthopedics;  Laterality: Bilateral;  . SHOULDER SURGERY Right   . TOTAL HIP ARTHROPLASTY    . TOTAL HIP ARTHROPLASTY Left 08/27/2014   Procedure: LEFT TOTAL HIP ARTHROPLASTY ANTERIOR APPROACH;  Surgeon: Durene Romans, MD;  Location: [facility];  Service: Orthopedics;  Laterality: Left;    Current Outpatient Medications  Medication Sig Dispense Refill  . aspirin EC 81 MG tablet Take 1 tablet (81 mg total) by mouth as needed (headache). May resume 4 days post-op    . busPIRone (BUSPAR) 15 MG tablet Take 15 mg by mouth daily.     . carbamazepine (TEGRETOL) 100 MG chewable tablet Chew 100 mg by mouth 2 (two) times daily.    Marland Kitchen esomeprazole (NEXIUM) 40 MG capsule Take 40 mg by mouth. Every 2-3 days    . ibuprofen (ADVIL,MOTRIN) 800 MG tablet Take 800 mg by mouth every 8 (eight) hours as needed.    Marland Kitchen  levothyroxine (SYNTHROID, LEVOTHROID) 25 MCG tablet Take 25 mcg by mouth daily before breakfast.     . methadone (DOLOPHINE) 10 MG/ML solution Take 110 mg by mouth daily.    Marland Kitchen oxyCODONE (ROXICODONE) 15 MG immediate release tablet Take 15 mg by mouth 2 (two) times daily.     Current Facility-Administered Medications  Medication Dose Route Frequency Provider Last Rate Last Dose  . vancomycin (VANCOCIN) 1,000 mg in sodium chloride 0.9 % 500 mL IVPB  1,000 mg Intravenous  Once Karie Chimera, PA-C        Allergies as of 07/03/2018  . (No Known Allergies)    Family History  Problem Relation Age of Onset  . Colon cancer Neg Hx     Social History   Socioeconomic History  . Marital status: Married    Spouse name: Not on file  . Number of children: Not on file  . Years of education: Not on file  . Highest education level: Not on file  Occupational History  . Not on file  Social Needs  . Financial resource strain: Not on file  . Food insecurity:    Worry: Not on file    Inability: Not on file  . Transportation needs:    Medical: Not on file    Non-medical: Not on file  Tobacco Use  . Smoking status: Current Every Day Smoker    Packs/day: 1.00    Years: 32.00    Pack years: 32.00    Types: Cigarettes  . Smokeless tobacco: Never Used  Substance and Sexual Activity  . Alcohol use: Not Currently    Alcohol/week: 16.0 standard drinks    Types: 6 Glasses of wine, 10 Cans of beer per week    Comment: None currently (last ETOH 03/01/2018); previously drinks 3 to 4 beers daily and 1 glass of wine; "way more" on the weekend  . Drug use: No    Comment: hx of marijuana use years ago   . Sexual activity: Not on file  Lifestyle  . Physical activity:    Days per week: Not on file    Minutes per session: Not on file  . Stress: Not on file  Relationships  . Social connections:    Talks on phone: Not on file    Gets together: Not on file    Attends religious service: Not on file    Active member of club or organization: Not on file    Attends meetings of clubs or organizations: Not on file    Relationship status: Not on file  . Intimate partner violence:    Fear of current or ex partner: Not on file    Emotionally abused: Not on file    Physically abused: Not on file    Forced sexual activity: Not on file  Other Topics Concern  . Not on file  Social History Narrative  . Not on file    Review of Systems: General: Negative for anorexia,  weight loss, fever, chills, fatigue, weakness. ENT: Negative for hoarseness, difficulty swallowing. CV: Negative for chest pain, angina, palpitations, peripheral edema.  Respiratory: Negative for dyspnea at rest, cough, sputum, wheezing.  GI: See history of present illness. MS: Negative for joint pain, low back pain.  Derm: Negative for rash or itching. Endo: Negative for unusual weight change.  Heme: Negative for bruising or bleeding. Allergy: Negative for rash or hives.    Physical Exam: BP 135/72   Pulse 81   Temp (!) 96.8 F (36 C) (  Oral)   Ht 5\' 11"  (1.803 m)   Wt 200 lb 12.8 oz (91.1 kg)   BMI 28.01 kg/m  General:   Alert and oriented. Pleasant and cooperative. Well-nourished and well-developed.  Head:  Normocephalic and atraumatic. Eyes:  Without icterus, sclera clear and conjunctiva pink.  Ears:  Normal auditory acuity. Cardiovascular:  S1, S2 present without murmurs appreciated. Extremities without clubbing or edema. Respiratory:  Clear to auscultation bilaterally. No wheezes, rales, or rhonchi. No distress.  Gastrointestinal:  +BS, soft, non-tender and non-distended. Hepatomegaly noted 1-2 fingerbreadths below the right costal margin. No splenomegaly noted. No guarding or rebound. No masses appreciated.  Rectal:  Deferred  Musculoskalatal:  Symmetrical without gross deformities. Skin:  Intact without significant lesions or rashes. Neurologic:  Alert and oriented x4;  grossly normal neurologically. Psych:  Alert and cooperative. Normal mood and affect. Heme/Lymph/Immune: No excessive bruising noted.    07/03/2018 9:39 AM   Disclaimer: This note was dictated with voice recognition software. Similar sounding words can inadvertently be transcribed and may not be corrected upon review.

## 2018-07-03 NOTE — Assessment & Plan Note (Signed)
Previously had elevated LFTs including AST/ALT and alkaline phosphatase.  He notes he was drinking heavily at that time.  He has since quit alcohol.  I will recheck a hepatic function panel and GGT.  Right upper quadrant ultrasound as per above.  Pending results may need to follow-up with MRCP or autoimmune serologies.  Return for follow-up in 2 months.  As an aside he notes he has had 2 or 3 colonoscopies and his last one was about 3 to 4 years ago "at [facility]" although it is not found in our system.  We will request from medical records.  He notes his previous colonoscopies were normal.

## 2018-07-03 NOTE — Assessment & Plan Note (Signed)
Previous ultrasound completed approximately 4 to 6 months ago found dilated common bile duct at 11 mm with no stones or obvious obstructions.  LFTs were also elevated at that time, but he noted that he was drinking heavily.  He has since given up alcohol.  Given the time that his labs I will repeat a right upper quadrant specific ultrasound to reevaluate for persistent CBD dilation.  If this is still present we will likely need to consider MRCP.  Further labs as per above.  Follow-up in 2 months.

## 2018-07-03 NOTE — Progress Notes (Signed)
CC'D TO PCP °

## 2018-07-05 DIAGNOSIS — G3184 Mild cognitive impairment, so stated: Secondary | ICD-10-CM | POA: Diagnosis not present

## 2018-07-08 ENCOUNTER — Other Ambulatory Visit: Payer: Self-pay

## 2018-07-08 ENCOUNTER — Ambulatory Visit
Admission: RE | Admit: 2018-07-08 | Discharge: 2018-07-08 | Disposition: A | Discharge status: Home / Self Care | Payer: 59 | Source: Ambulatory Visit | Attending: Podiatry | Admitting: Podiatry

## 2018-07-08 DIAGNOSIS — M19072 Primary osteoarthritis, left ankle and foot: Secondary | ICD-10-CM | POA: Diagnosis not present

## 2018-07-08 DIAGNOSIS — M722 Plantar fascial fibromatosis: Secondary | ICD-10-CM | POA: Diagnosis not present

## 2018-07-08 MED ORDER — GADOBENATE DIMEGLUMINE 529 MG/ML IV SOLN
18.0000 mL | Freq: Once | INTRAVENOUS | Status: AC | PRN
  Administered 2018-07-08: 18 mL via INTRAVENOUS

## 2018-07-10 ENCOUNTER — Ambulatory Visit (HOSPITAL_COMMUNITY): Payer: 59 | Attending: Nurse Practitioner

## 2018-07-11 DIAGNOSIS — M25511 Pain in right shoulder: Secondary | ICD-10-CM | POA: Diagnosis not present

## 2018-07-12 ENCOUNTER — Telehealth: Payer: Self-pay | Admitting: Podiatry

## 2018-07-12 NOTE — Telephone Encounter (Signed)
I informed pt the MRI results were available and he should schedule to discuss results with Dr. Al Corpus.

## 2018-07-12 NOTE — Telephone Encounter (Signed)
I had an MRI done Saturday afternoon and was wondering if any results were available? Please call me on my cell number.

## 2018-07-13 DIAGNOSIS — R569 Unspecified convulsions: Secondary | ICD-10-CM | POA: Diagnosis not present

## 2018-07-18 ENCOUNTER — Telehealth: Payer: Self-pay | Admitting: *Deleted

## 2018-07-18 ENCOUNTER — Ambulatory Visit: Payer: 59 | Admitting: Podiatry

## 2018-07-18 ENCOUNTER — Encounter: Payer: Self-pay | Admitting: Podiatry

## 2018-07-18 DIAGNOSIS — M2041 Other hammer toe(s) (acquired), right foot: Secondary | ICD-10-CM

## 2018-07-18 DIAGNOSIS — M722 Plantar fascial fibromatosis: Secondary | ICD-10-CM | POA: Diagnosis not present

## 2018-07-18 DIAGNOSIS — R0989 Other specified symptoms and signs involving the circulatory and respiratory systems: Secondary | ICD-10-CM

## 2018-07-18 NOTE — Telephone Encounter (Signed)
Faxed orders to [facility]. Prepared referral to be faxed once clinicals 07/18/2018 were received.

## 2018-07-18 NOTE — Progress Notes (Signed)
He presents today complaining of painful fibromas plantar aspect of the left foot and hammertoe deformities left foot.  Objective: Vital signs are stable he is alert and oriented x3 currently is not being treated by rheumatology and his pulses are no longer as palpable as they have been.  Capillary fill time is sluggish to the toes.  MRI does demonstrate plantar fibromas and osteoarthritic changes to the forefoot.  Assessment: Plantar fibromas hammertoe deformities dislocation of the toes most likely secondary to rheumatoid arthritis.  Plan: Discussed etiology pathology and surgical therapies at this point I am going to ask for vascular evaluation ABIs and toe pressures.  I am also going to request that he get started back with his rheumatologist to make sure that he is under good control with his rheumatoid arthritis before any surgeries performed.  I did discuss the possible need to remove the heads of the metatarsals he understands and is amenable to it if necessary.

## 2018-07-18 NOTE — Telephone Encounter (Deleted)
-----   Message from Max T Hyatt, DPM sent at 07/17/2018  6:58 AM EST ----- Please send for an over read and and inform the patient of the delay. 

## 2018-07-18 NOTE — Telephone Encounter (Signed)
-----   Message from Kristian Covey, [facility] sent at 07/18/2018 11:11 AM EST ----- Regarding: Vascular Vascular - ABI's and vascular consult - non-palpable pulses - surgical consideration

## 2018-07-19 DIAGNOSIS — M75121 Complete rotator cuff tear or rupture of right shoulder, not specified as traumatic: Secondary | ICD-10-CM | POA: Diagnosis not present

## 2018-07-19 DIAGNOSIS — M19011 Primary osteoarthritis, right shoulder: Secondary | ICD-10-CM | POA: Diagnosis not present

## 2018-07-19 NOTE — Telephone Encounter (Signed)
-----   Message from Elinor Parkinson, [place] sent at 07/17/2018  6:58 AM EST ----- Please send for an over read and and inform the patient of the delay.

## 2018-07-19 NOTE — Telephone Encounter (Signed)
NO.  Thanks for catching that.

## 2018-07-21 NOTE — Telephone Encounter (Signed)
Faxed referral, clinicals and demographics to [facility]. 

## 2018-07-25 DIAGNOSIS — M25811 Other specified joint disorders, right shoulder: Secondary | ICD-10-CM | POA: Diagnosis not present

## 2018-07-25 DIAGNOSIS — M25511 Pain in right shoulder: Secondary | ICD-10-CM | POA: Diagnosis not present

## 2018-07-27 DIAGNOSIS — G5601 Carpal tunnel syndrome, right upper limb: Secondary | ICD-10-CM | POA: Diagnosis not present

## 2018-07-27 DIAGNOSIS — G5602 Carpal tunnel syndrome, left upper limb: Secondary | ICD-10-CM | POA: Diagnosis not present

## 2018-07-28 ENCOUNTER — Ambulatory Visit (HOSPITAL_COMMUNITY)
Admission: RE | Admit: 2018-07-28 | Discharge: 2018-07-28 | Disposition: A | Discharge status: Home / Self Care | Payer: 59 | Source: Ambulatory Visit | Attending: Cardiology | Admitting: Cardiology

## 2018-07-28 ENCOUNTER — Other Ambulatory Visit: Payer: Self-pay | Admitting: Orthopedic Surgery

## 2018-07-28 ENCOUNTER — Other Ambulatory Visit: Payer: Self-pay

## 2018-07-28 DIAGNOSIS — R0989 Other specified symptoms and signs involving the circulatory and respiratory systems: Secondary | ICD-10-CM | POA: Diagnosis not present

## 2018-07-28 DIAGNOSIS — M25511 Pain in right shoulder: Secondary | ICD-10-CM

## 2018-08-01 DIAGNOSIS — R7989 Other specified abnormal findings of blood chemistry: Secondary | ICD-10-CM | POA: Diagnosis not present

## 2018-08-01 DIAGNOSIS — L409 Psoriasis, unspecified: Secondary | ICD-10-CM | POA: Diagnosis not present

## 2018-08-01 DIAGNOSIS — M2550 Pain in unspecified joint: Secondary | ICD-10-CM | POA: Diagnosis not present

## 2018-08-01 DIAGNOSIS — R5382 Chronic fatigue, unspecified: Secondary | ICD-10-CM | POA: Diagnosis not present

## 2018-08-01 DIAGNOSIS — L4059 Other psoriatic arthropathy: Secondary | ICD-10-CM | POA: Diagnosis not present

## 2018-08-04 ENCOUNTER — Telehealth: Payer: Self-pay | Admitting: *Deleted

## 2018-08-04 NOTE — Telephone Encounter (Signed)
Left message on pt's mobile phone to call for test results.

## 2018-08-04 NOTE — Telephone Encounter (Signed)
-----   Message from Elinor Parkinson, [place] sent at 07/31/2018  7:09 AM EDT ----- Let him know that his blood work is good and I will follow up with him for a surgical consult once he has rheumatoid under good control with his rheumatologist.

## 2018-08-04 NOTE — Telephone Encounter (Signed)
Left message with pt's wife to call for results.

## 2018-08-04 NOTE — Telephone Encounter (Signed)
Pt called I informed of Dr. Geryl Rankins review of results and orders. Pt states he has already started with his rheumatologist. I told pt that once cleared with the rheumatologist to have a medical clearance letter prepared for Dr. Al Corpus.

## 2018-08-07 ENCOUNTER — Telehealth: Payer: Self-pay | Admitting: *Deleted

## 2018-08-07 ENCOUNTER — Ambulatory Visit
Admission: RE | Admit: 2018-08-07 | Discharge: 2018-08-07 | Disposition: A | Discharge status: Home / Self Care | Payer: 59 | Source: Ambulatory Visit | Attending: Orthopedic Surgery | Admitting: Orthopedic Surgery

## 2018-08-07 ENCOUNTER — Other Ambulatory Visit: Payer: Self-pay

## 2018-08-07 DIAGNOSIS — M25511 Pain in right shoulder: Secondary | ICD-10-CM

## 2018-08-07 NOTE — Telephone Encounter (Signed)
Call placed to the patient. He stated that he did not feel like he needed to be seen since his leg segmental was within normal range and he was asymptomatic. Dr. Kirke Corin has agreed.  The appointment will be canceled due to the COVID precautions. The patient has been advised to please call if anything is needed.

## 2018-08-08 ENCOUNTER — Ambulatory Visit: Payer: 59 | Admitting: Cardiovascular Disease

## 2018-08-11 DIAGNOSIS — M25811 Other specified joint disorders, right shoulder: Secondary | ICD-10-CM | POA: Diagnosis not present

## 2018-09-05 ENCOUNTER — Ambulatory Visit (INDEPENDENT_AMBULATORY_CARE_PROVIDER_SITE_OTHER): Payer: 59 | Admitting: Nurse Practitioner

## 2018-09-05 ENCOUNTER — Encounter: Payer: Self-pay | Admitting: Internal Medicine

## 2018-09-05 ENCOUNTER — Encounter: Payer: Self-pay | Admitting: Nurse Practitioner

## 2018-09-05 ENCOUNTER — Telehealth: Payer: Self-pay | Admitting: *Deleted

## 2018-09-05 ENCOUNTER — Other Ambulatory Visit: Payer: Self-pay

## 2018-09-05 DIAGNOSIS — K838 Other specified diseases of biliary tract: Secondary | ICD-10-CM

## 2018-09-05 DIAGNOSIS — R945 Abnormal results of liver function studies: Secondary | ICD-10-CM | POA: Diagnosis not present

## 2018-09-05 DIAGNOSIS — R7989 Other specified abnormal findings of blood chemistry: Secondary | ICD-10-CM

## 2018-09-05 NOTE — Telephone Encounter (Signed)
I have mailed patient a letter with u/s appt details. Called patient and LMOVM making aware.

## 2018-09-05 NOTE — Patient Instructions (Signed)
Your health issues we discussed today were:   Abnormal liver labs and abnormal ultrasound: 1. Have your labs checked soon as you can 2. When the coronavirus pandemic is improving we can schedule your ultrasound for you 3. Call us if you have any worsening symptoms or abnormal symptoms  Overall I recommend:  1. Continue your current medications 2. Call us if you have any questions or concerns. 3. Return for follow-up in 6 months   Because of recent events of COVID-19 ("Coronavirus"), follow CDC recommendations:  1. Wash your hand frequently 2. Avoid touching your face 3. Stay away from people who are sick 4. If you have symptoms such as fever, cough, shortness of breath then call your healthcare provider for further guidance 5. If you are sick, STAY AT HOME unless otherwise directed by your healthcare provider. 6. Follow directions from state and national officials regarding staying safe   At [facility] we value your feedback. You may receive a survey about your visit today. Please share your experience as we strive to create trusting relationships with our patients to provide genuine, compassionate, quality care.  We appreciate your understanding and patience as we review any laboratory studies, imaging, and other diagnostic tests that are ordered as we care for you. Our office policy is 5 business days for review of these results, and any emergent or urgent results are addressed in a timely manner for your best interest. If you do not hear from our office in 1 week, please contact us.   We also encourage the use of MyChart, which contains your medical information for your review as well. If you are not enrolled in this feature, an access code is on this after visit summary for your convenience. Thank you for allowing [place] to be involved in your care.  It was great to see you today!  I hope you have a great day!!

## 2018-09-05 NOTE — Assessment & Plan Note (Signed)
We have seen him for elevated LFTs which are noted in HPI.  Most recent PCP labs showed normalization.  We recommended recheck of LFTs at his last visit but he states he completely forgot to do this.  He will likely go later today to have these rechecked.  He also had a dilated common bile duct, as per above.  Recommend complete labs, schedule ultrasound when able (no longer a hold on nonemergent radiology per COVID-19 pandemic).  Follow-up in 6 months.  Call if any questions or worsening symptoms.

## 2018-09-05 NOTE — Assessment & Plan Note (Signed)
His common bile duct was mildly dilated at 11 mm at last ultrasound.  At his last visit we recommended a repeat ultrasound to evaluate for any change.  His LFTs have since normalized.  However, he did not have his ultrasound completed.  There is currently a hold on nonemergent/urgent radiology given the COVID-19 pandemic.  Recommend he have this ultrasound scheduled when things have resolved with the pandemic.  Otherwise, get LFT labs as per below.  Follow-up in 6 months.

## 2018-09-05 NOTE — Progress Notes (Signed)
cc'd to pcp 

## 2018-09-05 NOTE — Progress Notes (Signed)
Referring Provider: Elfredia NevinsFusco, Lawrence, MD Primary Care Physician:  Elfredia NevinsFusco, Lawrence, MD Primary GI:  Dr. Jena Gaussourk  NOTE: Service was provided via telemedicine and was requested by the patient due to COVID-19 pandemic.  Method of visit: FaceTime  Patient Location: Home  Provider Location: Office  Reason for Phone Visit: Follow-up  The patient was consented to phone follow-up via telephone encounter including billing of the encounter (yes/no): Yes  Persons present on the phone encounter, with roles: Wife  Total time (minutes) spent on medical discussion: 12 minutes  Chief Complaint  Patient presents with  . elevated LFT's    Doing well    HPI:   Howard [name] is a 53 y.o. male who presents for virtual visit regarding: Follow-up regarding elevated LFTs and dilated common bile duct.  The patient was last seen in our office 07/03/2018 for elevated LFTs, history of alcohol abuse, and CBD dilation.  At the time of his referral he was asymptomatic from a GI standpoint but had elevated AST/ALT at 69/106, elevated alkaline phosphatase 160, normal bilirubin at 0.4.  Abdominal ultrasound completed 02/16/2018 found CBD diameter measured at 11 mm which is dilated and slightly enlarged compared to a prior exam, otherwise normal.  Recommended consideration of possible MRCP for further evaluation.  The most recent labs in our system unfortunately are dated 06/26/2015 with normal LFTs.  However, in 2016 he did have mild transaminitis with AST/ALT at 51/59, alkaline phosphatase and bilirubin normal.  At the time of his last visit he was doing okay.  Most recent blood work at primary care the week prior was normal, per the patient.  At his previous primary care office visit he had been drinking a lot.  He has since quit drinking and lost 20 to 25 pounds over 4 to 5 months.  His last colonoscopy was 3 to 4 years ago at [facility], although procedure report cannot be found (potentially at a different facility  and he is confused).  Recommended repeat LFTs, right upper quadrant ultrasound, continue avoidance of all alcohol, follow-up in 2 months.  Unfortunately, he did not complete his ultrasound nor his labs.  Today he states he's doing well overall. Feels great. Still not drinking. Stomach feels great. He states he forgot to get his labs. Denies abdominal pain, N/V, hematochezia, melena, fever, chills, unintentional weight loss. Denies URI and flu-like symptoms. Denies chest pain, dyspnea, dizziness, lightheadedness, syncope, near syncope. Denies any other upper or lower GI symptoms.  Past Medical History:  Diagnosis Date  . Alcohol abuse   . Anxiety   . Chronic back pain   . Chronic pain   . Fall   . Fracture of right elbow   . Osteoarthritis   . Psoriasis     Past Surgical History:  Procedure Laterality Date  . APPENDECTOMY    . LUMBAR LAMINECTOMY/DECOMPRESSION MICRODISCECTOMY Bilateral 12/31/2015   Procedure: LUMBER DECOMPRESSION L4-5/ L5-S1 BILATERALLY  ;  Surgeon: Jene EveryJeffrey Beane, MD;  Location: [facility];  Service: Orthopedics;  Laterality: Bilateral;  . SHOULDER SURGERY Right   . TOTAL HIP ARTHROPLASTY    . TOTAL HIP ARTHROPLASTY Left 08/27/2014   Procedure: LEFT TOTAL HIP ARTHROPLASTY ANTERIOR APPROACH;  Surgeon: Durene RomansMatthew Olin, MD;  Location: [facility];  Service: Orthopedics;  Laterality: Left;    Current Outpatient Medications  Medication Sig Dispense Refill  . amphetamine-dextroamphetamine (ADDERALL) 10 MG tablet Take 10 mg by mouth 2 (two) times daily with a meal.     . aspirin EC  81 MG tablet Take 1 tablet (81 mg total) by mouth as needed (headache). May resume 4 days post-op    . busPIRone (BUSPAR) 15 MG tablet Take 15 mg by mouth daily.     Marland Kitchen esomeprazole (NEXIUM) 40 MG capsule Take 40 mg by mouth. Every 2-3 days    . hydrOXYzine (ATARAX/VISTARIL) 25 MG tablet TAKE 2 TABLETS BY MOUTH BY MOUTH AS NEEDED 1 TO 2 HOURS BEFORE BEDTIME AS NEEDED    . ibuprofen (ADVIL,MOTRIN) 800 MG tablet  Take 800 mg by mouth every 8 (eight) hours as needed.    Marland Kitchen levothyroxine (SYNTHROID, LEVOTHROID) 25 MCG tablet Take 25 mcg by mouth daily before breakfast.     . methadone (DOLOPHINE) 10 MG/ML solution Take 110 mg by mouth daily.    . sildenafil (VIAGRA) 100 MG tablet as needed.      Current Facility-Administered Medications  Medication Dose Route Frequency Provider Last Rate Last Dose  . vancomycin (VANCOCIN) 1,000 mg in sodium chloride 0.9 % 500 mL IVPB  1,000 mg Intravenous Once Karie Chimera, PA-C        Allergies as of 09/05/2018  . (No Known Allergies)    Family History  Problem Relation Age of Onset  . Colon cancer Neg Hx     Social History   Socioeconomic History  . Marital status: Married    Spouse name: Not on file  . Number of children: Not on file  . Years of education: Not on file  . Highest education level: Not on file  Occupational History  . Not on file  Social Needs  . Financial resource strain: Not on file  . Food insecurity:    Worry: Not on file    Inability: Not on file  . Transportation needs:    Medical: Not on file    Non-medical: Not on file  Tobacco Use  . Smoking status: Current Every Day Smoker    Packs/day: 1.00    Years: 32.00    Pack years: 32.00    Types: Cigarettes  . Smokeless tobacco: Never Used  Substance and Sexual Activity  . Alcohol use: Not Currently    Alcohol/week: 16.0 standard drinks    Types: 6 Glasses of wine, 10 Cans of beer per week    Comment: None currently (last ETOH 03/01/2018); previously drinks 3 to 4 beers daily and 1 glass of wine; "way more" on the weekend  . Drug use: No    Comment: hx of marijuana use years ago   . Sexual activity: Not on file  Lifestyle  . Physical activity:    Days per week: Not on file    Minutes per session: Not on file  . Stress: Not on file  Relationships  . Social connections:    Talks on phone: Not on file    Gets together: Not on file    Attends religious service: Not on  file    Active member of club or organization: Not on file    Attends meetings of clubs or organizations: Not on file    Relationship status: Not on file  Other Topics Concern  . Not on file  Social History Narrative  . Not on file    Review of Systems: General: Negative for anorexia, weight loss, fever, chills, fatigue, weakness. Eyes: Negative for vision changes.  ENT: Negative for hoarseness, difficulty swallowing. CV: Negative for chest pain, angina, palpitations, peripheral edema.  Respiratory: Negative for dyspnea at rest, cough, sputum, wheezing.  GI:  See history of present illness. MS: Rotator cuff pain, holding on surgery due to COVID-19.  Derm: Negative for rash or itching.  Endo: Negative for unusual weight change.  Heme: Negative for bruising or bleeding. Allergy: Negative for rash or hives.  Physical Exam: Note: limited exam due to virtual visit General:   Alert and oriented. Pleasant and cooperative. Well-nourished and well-developed.  Head:  Normocephalic and atraumatic. Eyes:  Without icterus, sclera clear and conjunctiva pink.  Ears:  Normal auditory acuity. Skin:  Intact without facial significant lesions or rashes. Neurologic:  Alert and oriented x4;  grossly normal neurologically. Psych:  Alert and cooperative. Normal mood and affect. Heme/Lymph/Immune: No excessive bruising noted.

## 2018-09-25 DIAGNOSIS — M549 Dorsalgia, unspecified: Secondary | ICD-10-CM | POA: Diagnosis not present

## 2018-10-18 ENCOUNTER — Ambulatory Visit (HOSPITAL_COMMUNITY): Payer: 59

## 2018-10-20 NOTE — Pre-Procedure Instructions (Addendum)
Howard [name]  10/20/2018      [id number] [place], [place] - [address] [place] [address] Phone: [phone] Fax: [phone]   Your procedure is scheduled on Tuesday, June 9th  Report to [facility] Entrance A at 10:30 A.M.  Call this number if you have problems the morning of surgery:  [phone]   Remember:  Do not eat after midnight.  You may drink clear liquids until 9:30 A.M. Clear liquids allowed are:   Water, Juice (non-citric and without pulp), Carbonated beverages, Clear Tea, Black Coffee only, Plain Jell-O only, Gatorade and Plain Popsicles only   Please complete your PRE-SURGERY ENSURE that was provided to you by 9:30 A.M.  Please, if able, drink it in one setting. DO NOT SIP.   Take these medicines the morning of surgery with A SIP OF WATER   esomeprazole (NEXIUM)  levothyroxine (SYNTHROID, LEVOTHROID)  methadone (DOLOPHINE)   Follow your surgeon's instructions on when to stop Aspirin.  If no instructions were given by your surgeon then you will need to call the office to get those instructions.    7 days prior to surgery STOP taking any Aspirin (unless otherwise instructed by your surgeon), Aleve, Naproxen, Ibuprofen, Motrin, Advil, Goody's, BC's, all herbal medications, fish oil, and all vitamins.    Do not wear jewelry, make-up or nail polish.  Do not wear lotions, powders, or perfumes, or deodorant.  Do not shave 48 hours prior to surgery.  Men may shave face and neck.  Do not bring valuables to the hospital.  [facility] is not responsible for any belongings or valuables.  Contacts, dentures or bridgework may not be worn into surgery.  Leave your suitcase in the car.  After surgery it may be brought to your room.  For patients admitted to the hospital, discharge time will be determined by your treatment team.  Patients discharged the day of surgery will not be allowed to drive  home.   Special instructions:  [place]- Preparing For Surgery  Before surgery, you can play an important role. Because skin is not sterile, your skin needs to be as free of germs as possible. You can reduce the number of germs on your skin by washing with CHG (chlorahexidine gluconate) Soap before surgery.  CHG is an antiseptic cleaner which kills germs and bonds with the skin to continue killing germs even after washing.    Oral Hygiene is also important to reduce your risk of infection.  Remember - BRUSH YOUR TEETH THE MORNING OF SURGERY WITH YOUR REGULAR TOOTHPASTE  Please do not use if you have an allergy to CHG or antibacterial soaps. If your skin becomes reddened/irritated stop using the CHG.  Do not shave (including legs and underarms) for at least 48 hours prior to first CHG shower. It is OK to shave your face.  Please follow these instructions carefully.   1. Shower the NIGHT BEFORE SURGERY and the MORNING OF SURGERY with CHG.   2. If you chose to wash your hair, wash your hair first as usual with your normal shampoo.  3. After you shampoo, rinse your hair and body thoroughly to remove the shampoo.  4. Use CHG as you would any other liquid soap. You can apply CHG directly to the skin and wash gently with a scrungie or a clean washcloth.   5. Apply the CHG Soap to your body ONLY FROM THE NECK DOWN.  Do not use on open wounds or open sores. Avoid contact with your eyes, ears, mouth and genitals (private parts). Wash Face and genitals (private parts)  with your normal soap.  6. Wash thoroughly, paying special attention to the area where your surgery will be performed.  7. Thoroughly rinse your body with warm water from the neck down.  8. DO NOT shower/wash with your normal soap after using and rinsing off the CHG Soap.  9. Pat yourself dry with a CLEAN TOWEL.  10. Wear CLEAN PAJAMAS to bed the night before surgery, wear comfortable clothes the morning of surgery  11. Place  CLEAN SHEETS on your bed the night of your first shower and DO NOT SLEEP WITH PETS.   Day of Surgery:  Do not apply any deodorants/lotions.  Please wear clean clothes to the hospital/surgery center.   Remember to brush your teeth WITH YOUR REGULAR TOOTHPASTE.  Please read over the following fact sheets that you were given. Pain Booklet, Coughing and Deep Breathing, MRSA Information and Surgical Site Infection Prevention

## 2018-10-23 ENCOUNTER — Other Ambulatory Visit: Payer: Self-pay

## 2018-10-23 ENCOUNTER — Encounter (HOSPITAL_COMMUNITY): Payer: Self-pay

## 2018-10-23 ENCOUNTER — Other Ambulatory Visit (HOSPITAL_COMMUNITY)
Admission: RE | Admit: 2018-10-23 | Discharge: 2018-10-23 | Disposition: A | Discharge status: Home / Self Care | Payer: 59 | Source: Ambulatory Visit | Attending: Orthopedic Surgery | Admitting: Orthopedic Surgery

## 2018-10-23 ENCOUNTER — Encounter (HOSPITAL_COMMUNITY)
Admission: RE | Admit: 2018-10-23 | Discharge: 2018-10-23 | Disposition: A | Discharge status: Home / Self Care | Payer: 59 | Source: Ambulatory Visit | Attending: Orthopedic Surgery | Admitting: Orthopedic Surgery

## 2018-10-23 DIAGNOSIS — Z01812 Encounter for preprocedural laboratory examination: Secondary | ICD-10-CM | POA: Insufficient documentation

## 2018-10-23 DIAGNOSIS — Z1159 Encounter for screening for other viral diseases: Secondary | ICD-10-CM | POA: Diagnosis not present

## 2018-10-23 HISTORY — DX: Depression, unspecified: F32.A

## 2018-10-23 LAB — BASIC METABOLIC PANEL
Anion gap: 7 (ref 5–15)
BUN: 19 mg/dL (ref 6–20)
CO2: 26 mmol/L (ref 22–32)
Calcium: 8.7 mg/dL — ABNORMAL LOW (ref 8.9–10.3)
Chloride: 106 mmol/L (ref 98–111)
Creatinine, Ser: 0.62 mg/dL (ref 0.61–1.24)
GFR calc Af Amer: >60 mL/min (ref >60)
GFR calc non Af Amer: >60 mL/min (ref >60)
Glucose, Bld: 103 mg/dL — ABNORMAL HIGH (ref 70–99)
Potassium: 4.3 mmol/L (ref 3.5–5.1)
Sodium: 139 mmol/L (ref 135–145)

## 2018-10-23 LAB — CBC
HCT: 40 % (ref 39.0–52.0)
Hemoglobin: 13.4 g/dL (ref 13.0–17.0)
MCH: 30.9 pg (ref 26.0–34.0)
MCHC: 33.5 g/dL (ref 30.0–36.0)
MCV: 92.4 fL (ref 80.0–100.0)
Platelets: 168 10*3/uL (ref 150–400)
RBC: 4.33 MIL/uL (ref 4.22–5.81)
RDW: 12.9 % (ref 11.5–15.5)
WBC: 5.5 10*3/uL (ref 4.0–10.5)
nRBC: 0 % (ref 0.0–0.2)

## 2018-10-23 LAB — SURGICAL PCR SCREEN
MRSA, PCR: NEGATIVE
Staphylococcus aureus: NEGATIVE

## 2018-10-23 LAB — SARS CORONAVIRUS 2 BY RT PCR (HOSPITAL ORDER, PERFORMED IN ~~LOC~~ HOSPITAL LAB): SARS Coronavirus 2: NEGATIVE

## 2018-10-23 MED ORDER — TRANEXAMIC ACID-NACL 1000-0.7 MG/100ML-% IV SOLN
1000.0000 mg | INTRAVENOUS | Status: AC
  Administered 2018-10-24: 1000 mg via INTRAVENOUS
  Filled 2018-10-23: qty 100

## 2018-10-23 NOTE — Progress Notes (Signed)
PCP - Dr. Redmond School Cardiologist - denies  Chest x-ray - denies EKG - denies Stress Test - per patient "done more than five years ago and no follow-up needed" ECHO - denies Cardiac Cath - denies  Sleep Study - denies CPAP - denies  Blood Thinner Instructions: N/A Aspirin Instructions: per patient no longer uses, stopped about 5 months ago  Anesthesia review: No  Coronavirus Screening  Have you experienced the following symptoms:  Cough yes/no: No Fever (>100.28F)  yes/no: No Runny nose yes/no: No Sore throat yes/no: No Difficulty breathing/shortness of breath  yes/no: No  Have you or a family member traveled in the last 14 days and where? yes/no: No  If the patient indicates "YES" to the above questions, their PAT will be rescheduled to limit the exposure to others and, the surgeon will be notified. THE PATIENT WILL NEED TO BE ASYMPTOMATIC FOR 14 DAYS.   If the patient is not experiencing any of these symptoms, the PAT nurse will instruct them to NOT bring anyone with them to their appointment since they may have these symptoms or traveled as well.   Please remind your patients and families that hospital visitation restrictions are in effect and the importance of the restrictions.   Patient denies shortness of breath, fever, cough and chest pain at PAT appointment   Patient verbalized understanding of instructions that were given to them at the PAT appointment. Patient was also instructed that they will need to review over the PAT instructions again at home before surgery.

## 2018-10-24 ENCOUNTER — Encounter (HOSPITAL_COMMUNITY): Payer: Self-pay | Admitting: Anesthesiology

## 2018-10-24 ENCOUNTER — Other Ambulatory Visit: Payer: Self-pay

## 2018-10-24 ENCOUNTER — Encounter (HOSPITAL_COMMUNITY)
Admission: RE | Disposition: A | Discharge status: Home / Self Care | Payer: Self-pay | Source: Home / Self Care | Attending: Orthopedic Surgery

## 2018-10-24 ENCOUNTER — Inpatient Hospital Stay (HOSPITAL_COMMUNITY): Payer: 59 | Admitting: Anesthesiology

## 2018-10-24 ENCOUNTER — Inpatient Hospital Stay (HOSPITAL_COMMUNITY)
Admission: RE | Admit: 2018-10-24 | Discharge: 2018-10-25 | DRG: 483 | Disposition: A | Discharge status: Home / Self Care | Payer: 59 | Attending: Orthopedic Surgery | Admitting: Orthopedic Surgery

## 2018-10-24 ENCOUNTER — Inpatient Hospital Stay (HOSPITAL_COMMUNITY): Payer: 59

## 2018-10-24 DIAGNOSIS — Z79890 Hormone replacement therapy (postmenopausal): Secondary | ICD-10-CM

## 2018-10-24 DIAGNOSIS — Z96643 Presence of artificial hip joint, bilateral: Secondary | ICD-10-CM | POA: Diagnosis present

## 2018-10-24 DIAGNOSIS — Z96611 Presence of right artificial shoulder joint: Secondary | ICD-10-CM

## 2018-10-24 DIAGNOSIS — M12811 Other specific arthropathies, not elsewhere classified, right shoulder: Secondary | ICD-10-CM | POA: Diagnosis present

## 2018-10-24 DIAGNOSIS — Z79899 Other long term (current) drug therapy: Secondary | ICD-10-CM | POA: Diagnosis not present

## 2018-10-24 DIAGNOSIS — M75101 Unspecified rotator cuff tear or rupture of right shoulder, not specified as traumatic: Secondary | ICD-10-CM | POA: Diagnosis present

## 2018-10-24 DIAGNOSIS — F329 Major depressive disorder, single episode, unspecified: Secondary | ICD-10-CM | POA: Diagnosis present

## 2018-10-24 DIAGNOSIS — F1010 Alcohol abuse, uncomplicated: Secondary | ICD-10-CM | POA: Diagnosis present

## 2018-10-24 DIAGNOSIS — F419 Anxiety disorder, unspecified: Secondary | ICD-10-CM | POA: Diagnosis present

## 2018-10-24 DIAGNOSIS — F17210 Nicotine dependence, cigarettes, uncomplicated: Secondary | ICD-10-CM | POA: Diagnosis present

## 2018-10-24 DIAGNOSIS — Z7982 Long term (current) use of aspirin: Secondary | ICD-10-CM

## 2018-10-24 HISTORY — PX: REVERSE SHOULDER ARTHROPLASTY: SHX5054

## 2018-10-24 SURGERY — ARTHROPLASTY, SHOULDER, TOTAL, REVERSE
Anesthesia: General | Site: Shoulder | Laterality: Right

## 2018-10-24 MED ORDER — KETOROLAC TROMETHAMINE 30 MG/ML IJ SOLN
30.0000 mg | Freq: Once | INTRAMUSCULAR | Status: AC | PRN
  Administered 2018-10-24: 30 mg via INTRAVENOUS

## 2018-10-24 MED ORDER — VANCOMYCIN HCL 1000 MG IV SOLR
INTRAVENOUS | Status: DC | PRN
  Administered 2018-10-24: 1000 mg via TOPICAL

## 2018-10-24 MED ORDER — HYDROCODONE-ACETAMINOPHEN 5-325 MG PO TABS
1.0000 | ORAL_TABLET | ORAL | Status: DC | PRN
  Administered 2018-10-25: 2 via ORAL
  Filled 2018-10-24: qty 2

## 2018-10-24 MED ORDER — MORPHINE SULFATE (PF) 2 MG/ML IV SOLN: 0.5000 mg | INTRAVENOUS | Status: DC | PRN

## 2018-10-24 MED ORDER — BUPIVACAINE-EPINEPHRINE (PF) 0.25% -1:200000 IJ SOLN
INTRAMUSCULAR | Status: AC
  Filled 2018-10-24: qty 30

## 2018-10-24 MED ORDER — PROMETHAZINE HCL 25 MG/ML IJ SOLN: 6.2500 mg | INTRAMUSCULAR | Status: DC | PRN

## 2018-10-24 MED ORDER — ONDANSETRON HCL 4 MG PO TABS: 4.0000 mg | ORAL_TABLET | Freq: Four times a day (QID) | ORAL | Status: DC | PRN

## 2018-10-24 MED ORDER — HYDROXYZINE HCL 25 MG PO TABS
25.0000 mg | ORAL_TABLET | Freq: Every day | ORAL | Status: DC
  Administered 2018-10-24: 25 mg via ORAL
  Filled 2018-10-24: qty 1

## 2018-10-24 MED ORDER — HYDROMORPHONE HCL 1 MG/ML IJ SOLN
INTRAMUSCULAR | Status: AC
  Filled 2018-10-24: qty 1

## 2018-10-24 MED ORDER — HYDROMORPHONE HCL 1 MG/ML IJ SOLN
0.2500 mg | INTRAMUSCULAR | Status: DC | PRN
  Administered 2018-10-24 (×4): 0.5 mg via INTRAVENOUS

## 2018-10-24 MED ORDER — KETOROLAC TROMETHAMINE 30 MG/ML IJ SOLN
INTRAMUSCULAR | Status: AC
  Filled 2018-10-24: qty 1

## 2018-10-24 MED ORDER — CEFAZOLIN SODIUM-DEXTROSE 2-4 GM/100ML-% IV SOLN
INTRAVENOUS | Status: AC
  Filled 2018-10-24: qty 100

## 2018-10-24 MED ORDER — SODIUM CHLORIDE 0.9 % IV SOLN
INTRAVENOUS | Status: DC | PRN
  Administered 2018-10-24: 25 ug/min via INTRAVENOUS

## 2018-10-24 MED ORDER — MIDAZOLAM HCL 2 MG/2ML IJ SOLN
INTRAMUSCULAR | Status: AC
  Filled 2018-10-24: qty 2

## 2018-10-24 MED ORDER — SODIUM CHLORIDE 0.9 % IR SOLN
Status: DC | PRN
  Administered 2018-10-24 (×2): 1000 mL

## 2018-10-24 MED ORDER — PANTOPRAZOLE SODIUM 40 MG PO TBEC
40.0000 mg | DELAYED_RELEASE_TABLET | Freq: Every day | ORAL | Status: DC
  Administered 2018-10-24 – 2018-10-25 (×2): 40 mg via ORAL
  Filled 2018-10-24 (×2): qty 1

## 2018-10-24 MED ORDER — FENTANYL CITRATE (PF) 100 MCG/2ML IJ SOLN
INTRAMUSCULAR | Status: AC
  Filled 2018-10-24: qty 2

## 2018-10-24 MED ORDER — ACETAMINOPHEN 10 MG/ML IV SOLN
1000.0000 mg | Freq: Once | INTRAVENOUS | Status: AC
  Administered 2018-10-24: 1000 mg via INTRAVENOUS

## 2018-10-24 MED ORDER — SUCCINYLCHOLINE CHLORIDE 200 MG/10ML IV SOSY
PREFILLED_SYRINGE | INTRAVENOUS | Status: AC
  Filled 2018-10-24: qty 10

## 2018-10-24 MED ORDER — HYDROCODONE-ACETAMINOPHEN 7.5-325 MG PO TABS
1.0000 | ORAL_TABLET | ORAL | Status: DC | PRN
  Administered 2018-10-24: 2 via ORAL
  Filled 2018-10-24: qty 2

## 2018-10-24 MED ORDER — CEFAZOLIN SODIUM-DEXTROSE 1-4 GM/50ML-% IV SOLN
1.0000 g | Freq: Four times a day (QID) | INTRAVENOUS | Status: AC
  Administered 2018-10-24 – 2018-10-25 (×3): 1 g via INTRAVENOUS
  Filled 2018-10-24 (×3): qty 50

## 2018-10-24 MED ORDER — MIDAZOLAM HCL 2 MG/2ML IJ SOLN
INTRAMUSCULAR | Status: AC
  Administered 2018-10-24: 3 mg via INTRAVENOUS
  Filled 2018-10-24: qty 2

## 2018-10-24 MED ORDER — BUPIVACAINE-EPINEPHRINE 0.25% -1:200000 IJ SOLN
INTRAMUSCULAR | Status: DC | PRN
  Administered 2018-10-24: 10 mL

## 2018-10-24 MED ORDER — LEVOTHYROXINE SODIUM 25 MCG PO TABS
25.0000 ug | ORAL_TABLET | Freq: Every day | ORAL | Status: DC
  Administered 2018-10-25: 25 ug via ORAL
  Filled 2018-10-24: qty 1

## 2018-10-24 MED ORDER — TRANEXAMIC ACID-NACL 1000-0.7 MG/100ML-% IV SOLN
1000.0000 mg | Freq: Once | INTRAVENOUS | Status: AC
  Administered 2018-10-24: 1000 mg via INTRAVENOUS
  Filled 2018-10-24 (×2): qty 100

## 2018-10-24 MED ORDER — ACETAMINOPHEN 500 MG PO TABS
500.0000 mg | ORAL_TABLET | Freq: Four times a day (QID) | ORAL | Status: DC
  Administered 2018-10-24: 500 mg via ORAL
  Filled 2018-10-24: qty 1

## 2018-10-24 MED ORDER — METHOCARBAMOL 500 MG PO TABS: 500.0000 mg | ORAL_TABLET | Freq: Four times a day (QID) | ORAL | Status: DC | PRN

## 2018-10-24 MED ORDER — BUPIVACAINE-EPINEPHRINE (PF) 0.5% -1:200000 IJ SOLN
INTRAMUSCULAR | Status: DC | PRN
  Administered 2018-10-24 (×5): 3 mL via PERINEURAL

## 2018-10-24 MED ORDER — PROPOFOL 10 MG/ML IV BOLUS
INTRAVENOUS | Status: AC
  Filled 2018-10-24: qty 20

## 2018-10-24 MED ORDER — MEPERIDINE HCL 25 MG/ML IJ SOLN: 6.2500 mg | INTRAMUSCULAR | Status: DC | PRN

## 2018-10-24 MED ORDER — MIDAZOLAM HCL 2 MG/2ML IJ SOLN
3.0000 mg | Freq: Once | INTRAMUSCULAR | Status: AC
  Administered 2018-10-24: 3 mg via INTRAVENOUS

## 2018-10-24 MED ORDER — DEXAMETHASONE SODIUM PHOSPHATE 10 MG/ML IJ SOLN
INTRAMUSCULAR | Status: DC | PRN
  Administered 2018-10-24: 10 mg via INTRAVENOUS

## 2018-10-24 MED ORDER — KETOROLAC TROMETHAMINE 15 MG/ML IJ SOLN
7.5000 mg | Freq: Four times a day (QID) | INTRAMUSCULAR | Status: DC
  Administered 2018-10-24 – 2018-10-25 (×2): 7.5 mg via INTRAVENOUS
  Filled 2018-10-24 (×2): qty 1

## 2018-10-24 MED ORDER — CEFAZOLIN SODIUM-DEXTROSE 2-4 GM/100ML-% IV SOLN
2.0000 g | INTRAVENOUS | Status: AC
  Administered 2018-10-24: 2 g via INTRAVENOUS

## 2018-10-24 MED ORDER — LIDOCAINE 2% (20 MG/ML) 5 ML SYRINGE
INTRAMUSCULAR | Status: AC
  Filled 2018-10-24: qty 5

## 2018-10-24 MED ORDER — FENTANYL CITRATE (PF) 100 MCG/2ML IJ SOLN
150.0000 ug | Freq: Once | INTRAMUSCULAR | Status: AC
  Administered 2018-10-24: 150 ug via INTRAVENOUS

## 2018-10-24 MED ORDER — VANCOMYCIN HCL 1000 MG IV SOLR
INTRAVENOUS | Status: AC
  Filled 2018-10-24: qty 1000

## 2018-10-24 MED ORDER — METHOCARBAMOL 1000 MG/10ML IJ SOLN
500.0000 mg | Freq: Four times a day (QID) | INTRAVENOUS | Status: DC | PRN
  Filled 2018-10-24: qty 5

## 2018-10-24 MED ORDER — METHADONE HCL 10 MG/ML PO CONC: 110.0000 mg | Freq: Every day | ORAL | Status: DC

## 2018-10-24 MED ORDER — LACTATED RINGERS IV SOLN
INTRAVENOUS | Status: DC
  Administered 2018-10-24 (×2): via INTRAVENOUS

## 2018-10-24 MED ORDER — DEXAMETHASONE SODIUM PHOSPHATE 10 MG/ML IJ SOLN
INTRAMUSCULAR | Status: AC
  Filled 2018-10-24: qty 1

## 2018-10-24 MED ORDER — METHADONE HCL 10 MG PO TABS
110.0000 mg | ORAL_TABLET | Freq: Every day | ORAL | Status: DC
  Administered 2018-10-25: 110 mg via ORAL
  Filled 2018-10-24: qty 11

## 2018-10-24 MED ORDER — CHLORHEXIDINE GLUCONATE 4 % EX LIQD: 60.0000 mL | Freq: Once | CUTANEOUS | Status: DC

## 2018-10-24 MED ORDER — PROPOFOL 10 MG/ML IV BOLUS
INTRAVENOUS | Status: DC | PRN
  Administered 2018-10-24: 200 mg via INTRAVENOUS

## 2018-10-24 MED ORDER — PHENOL 1.4 % MT LIQD: 1.0000 | OROMUCOSAL | Status: DC | PRN

## 2018-10-24 MED ORDER — METOCLOPRAMIDE HCL 5 MG/ML IJ SOLN: 5.0000 mg | Freq: Three times a day (TID) | INTRAMUSCULAR | Status: DC | PRN

## 2018-10-24 MED ORDER — ACETAMINOPHEN 325 MG PO TABS: 325.0000 mg | ORAL_TABLET | Freq: Four times a day (QID) | ORAL | Status: DC | PRN

## 2018-10-24 MED ORDER — ONDANSETRON HCL 4 MG/2ML IJ SOLN
INTRAMUSCULAR | Status: AC
  Filled 2018-10-24: qty 2

## 2018-10-24 MED ORDER — MENTHOL 3 MG MT LOZG: 1.0000 | LOZENGE | OROMUCOSAL | Status: DC | PRN

## 2018-10-24 MED ORDER — BUPIVACAINE LIPOSOME 1.3 % IJ SUSP
INTRAMUSCULAR | Status: DC | PRN
  Administered 2018-10-24 (×5): 2 mL via PERINEURAL

## 2018-10-24 MED ORDER — DOCUSATE SODIUM 100 MG PO CAPS
100.0000 mg | ORAL_CAPSULE | Freq: Two times a day (BID) | ORAL | Status: DC
  Administered 2018-10-24: 100 mg via ORAL
  Filled 2018-10-24 (×2): qty 1

## 2018-10-24 MED ORDER — FENTANYL CITRATE (PF) 100 MCG/2ML IJ SOLN
INTRAMUSCULAR | Status: AC
  Administered 2018-10-24: 150 ug via INTRAVENOUS
  Filled 2018-10-24: qty 2

## 2018-10-24 MED ORDER — METOCLOPRAMIDE HCL 5 MG PO TABS: 5.0000 mg | ORAL_TABLET | Freq: Three times a day (TID) | ORAL | Status: DC | PRN

## 2018-10-24 MED ORDER — ONDANSETRON HCL 4 MG/2ML IJ SOLN: 4.0000 mg | Freq: Four times a day (QID) | INTRAMUSCULAR | Status: DC | PRN

## 2018-10-24 MED ORDER — ROCURONIUM BROMIDE 10 MG/ML (PF) SYRINGE
PREFILLED_SYRINGE | INTRAVENOUS | Status: AC
  Filled 2018-10-24: qty 10

## 2018-10-24 MED ORDER — ONDANSETRON HCL 4 MG/2ML IJ SOLN
INTRAMUSCULAR | Status: DC | PRN
  Administered 2018-10-24: 4 mg via INTRAVENOUS

## 2018-10-24 MED ORDER — ACETAMINOPHEN 10 MG/ML IV SOLN
INTRAVENOUS | Status: AC
  Filled 2018-10-24: qty 100

## 2018-10-24 MED ORDER — LIDOCAINE HCL (CARDIAC) PF 100 MG/5ML IV SOSY
PREFILLED_SYRINGE | INTRAVENOUS | Status: DC | PRN
  Administered 2018-10-24: 100 mg via INTRAVENOUS

## 2018-10-24 MED ORDER — SUCCINYLCHOLINE CHLORIDE 200 MG/10ML IV SOSY
PREFILLED_SYRINGE | INTRAVENOUS | Status: DC | PRN
  Administered 2018-10-24: 160 mg via INTRAVENOUS

## 2018-10-24 MED ORDER — ROCURONIUM BROMIDE 10 MG/ML (PF) SYRINGE
PREFILLED_SYRINGE | INTRAVENOUS | Status: DC | PRN
  Administered 2018-10-24: 10 mg via INTRAVENOUS
  Administered 2018-10-24: 20 mg via INTRAVENOUS
  Administered 2018-10-24: 30 mg via INTRAVENOUS
  Administered 2018-10-24: 10 mg via INTRAVENOUS

## 2018-10-24 MED ORDER — AMPHETAMINE-DEXTROAMPHETAMINE 10 MG PO TABS
20.0000 mg | ORAL_TABLET | Freq: Two times a day (BID) | ORAL | Status: DC
  Administered 2018-10-25: 20 mg via ORAL
  Filled 2018-10-24: qty 2

## 2018-10-24 SURGICAL SUPPLY — 74 items
ALCOHOL 70% 16 OZ (MISCELLANEOUS) IMPLANT
AUG COMP REV MI TAPER ADAPTER (Joint) ×2 IMPLANT
AUGMENT COMP REV MI TAPR ADPTR (Joint) ×1 IMPLANT
BEARING HUMERAL SHLDER 36M STD (Shoulder) ×1 IMPLANT
BIT DRILL 2.7 W/STOP DISP (BIT) ×2 IMPLANT
BIT DRILL 5/64X5 DISP (BIT) ×2 IMPLANT
BIT DRILL QUICK REL 1/8 2PK SL (DRILL) ×1 IMPLANT
BIT DRILL TWIST 2.7 (BIT) ×2 IMPLANT
BLADE SAG 18X100X1.27 (BLADE) ×2 IMPLANT
CLSR STERI-STRIP ANTIMIC 1/2X4 (GAUZE/BANDAGES/DRESSINGS) ×2 IMPLANT
COVER SURGICAL LIGHT HANDLE (MISCELLANEOUS) ×2 IMPLANT
COVER WAND RF STERILE (DRAPES) IMPLANT
DRAPE IMP U-DRAPE 54X76 (DRAPES) ×4 IMPLANT
DRAPE INCISE IOBAN 66X45 STRL (DRAPES) ×2 IMPLANT
DRAPE ORTHO SPLIT 77X108 STRL (DRAPES) ×2
DRAPE SURG 17X23 STRL (DRAPES) ×2 IMPLANT
DRAPE SURG ORHT 6 SPLT 77X108 (DRAPES) ×2 IMPLANT
DRAPE U-SHAPE 47X51 STRL (DRAPES) ×2 IMPLANT
DRILL QUICK RELEASE 1/8 INCH (DRILL) ×1
DRSG AQUACEL AG ADV 3.5X10 (GAUZE/BANDAGES/DRESSINGS) ×2 IMPLANT
DURAPREP 26ML APPLICATOR (WOUND CARE) ×2 IMPLANT
ELECT BLADE 4.0 EZ CLEAN MEGAD (MISCELLANEOUS) ×2
ELECT REM PT RETURN 9FT ADLT (ELECTROSURGICAL) ×2
ELECTRODE BLDE 4.0 EZ CLN MEGD (MISCELLANEOUS) ×1 IMPLANT
ELECTRODE REM PT RTRN 9FT ADLT (ELECTROSURGICAL) ×1 IMPLANT
GLENOID SPHERE 36MM CVD +3 (Orthopedic Implant) ×2 IMPLANT
GLOVE BIO SURGEON STRL SZ7.5 (GLOVE) ×2 IMPLANT
GLOVE BIOGEL PI IND STRL 8 (GLOVE) ×1 IMPLANT
GLOVE BIOGEL PI INDICATOR 8 (GLOVE) ×1
GOWN STRL REUS W/ TWL LRG LVL3 (GOWN DISPOSABLE) ×1 IMPLANT
GOWN STRL REUS W/ TWL XL LVL3 (GOWN DISPOSABLE) ×1 IMPLANT
GOWN STRL REUS W/TWL LRG LVL3 (GOWN DISPOSABLE) ×1
GOWN STRL REUS W/TWL XL LVL3 (GOWN DISPOSABLE) ×1
KIT BASIN OR (CUSTOM PROCEDURE TRAY) ×2 IMPLANT
KIT TURNOVER KIT B (KITS) ×2 IMPLANT
MANIFOLD NEPTUNE II (INSTRUMENTS) ×2 IMPLANT
NEEDLE 1/2 CIR MAYO (NEEDLE) ×2 IMPLANT
NEEDLE HYPO 25GX1X1/2 BEV (NEEDLE) ×2 IMPLANT
NS IRRIG 1000ML POUR BTL (IV SOLUTION) ×2 IMPLANT
PACK SHOULDER (CUSTOM PROCEDURE TRAY) ×2 IMPLANT
PAD ARMBOARD 7.5X6 YLW CONV (MISCELLANEOUS) ×4 IMPLANT
PIN THREADED REVERSE (PIN) ×2 IMPLANT
REAMER GUIDE BUSHING SURG DISP (MISCELLANEOUS) ×2 IMPLANT
REAMER GUIDE W/SCREW AUG (MISCELLANEOUS) ×2 IMPLANT
RESTRAINT HEAD UNIVERSAL NS (MISCELLANEOUS) ×2 IMPLANT
SCREW BONE STRL 6.5MMX30MM (Screw) ×2 IMPLANT
SCREW LOCKING 4.75MMX15MM (Screw) ×4 IMPLANT
SCREW LOCKING NS 4.75MMX20MM (Screw) ×2 IMPLANT
SCREW LOCKING STRL 4.75X25X3.5 (Screw) ×2 IMPLANT
SHOULDER HUMERAL BEAR 36M STD (Shoulder) ×2 IMPLANT
SLING ARM IMMOBILIZER LRG (SOFTGOODS) IMPLANT
SLING ARM IMMOBILIZER MED (SOFTGOODS) IMPLANT
SPONGE LAP 18X18 RF (DISPOSABLE) ×2 IMPLANT
SPONGE LAP 4X18 RFD (DISPOSABLE) ×2 IMPLANT
STEM HUMERAL STRL 16MMX83MM (Stem) ×2 IMPLANT
STRIP CLOSURE SKIN 1/2X4 (GAUZE/BANDAGES/DRESSINGS) ×2 IMPLANT
SUCTION FRAZIER HANDLE 10FR (MISCELLANEOUS) ×1
SUCTION TUBE FRAZIER 10FR DISP (MISCELLANEOUS) ×1 IMPLANT
SUT BROADBAND TAPE 2PK 1.5 (SUTURE) ×2 IMPLANT
SUT FIBERWIRE #2 38 T-5 BLUE (SUTURE) ×2
SUT MAXBRAID (SUTURE) ×4 IMPLANT
SUT MNCRL AB 4-0 PS2 18 (SUTURE) ×2 IMPLANT
SUT VIC AB 1 CT1 27 (SUTURE)
SUT VIC AB 1 CT1 27XBRD ANBCTR (SUTURE) IMPLANT
SUT VIC AB 2-0 CT1 27 (SUTURE) ×2
SUT VIC AB 2-0 CT1 TAPERPNT 27 (SUTURE) ×2 IMPLANT
SUTURE FIBERWR #2 38 T-5 BLUE (SUTURE) ×1 IMPLANT
SYR CONTROL 10ML LL (SYRINGE) ×2 IMPLANT
TOWEL OR 17X24 6PK STRL BLUE (TOWEL DISPOSABLE) ×2 IMPLANT
TOWEL OR 17X26 10 PK STRL BLUE (TOWEL DISPOSABLE) ×2 IMPLANT
TOWER CARTRIDGE SMART MIX (DISPOSABLE) IMPLANT
TRAY HUM REV SHOULDER STD +6 (Shoulder) ×2 IMPLANT
WATER STERILE IRR 1000ML POUR (IV SOLUTION) IMPLANT
YANKAUER SUCT BULB TIP NO VENT (SUCTIONS) ×2 IMPLANT

## 2018-10-24 NOTE — Discharge Instructions (Signed)
Maintain your sling to the right upper extremity for comfort only.  You may remove this as needed for daily exercises and activities of daily living.  No lifting over 2 pounds with the right arm.  Maintain your postoperative dressing until your follow-up appointment.  This is waterproof and you may shower with this in place but do not submerge underwater.  For pain control take Tylenol and Advil as needed for mild to moderate pain.  You should also apply ice to the right shoulder for 20 to 30 minutes at a time for every hour that you are awake.  For breakthrough pain use oxycodone as directed.   Return to see Dr. Stann Mainland in 2 weeks for routine wound check.

## 2018-10-24 NOTE — Anesthesia Postprocedure Evaluation (Signed)
Anesthesia Post Note  Patient: Howard [name]  Procedure(s) Performed: REVERSE SHOULDER ARTHROPLASTY (Right Shoulder)     Patient location during evaluation: PACU Anesthesia Type: General Level of consciousness: sedated Pain management: pain level controlled Vital Signs Assessment: post-procedure vital signs reviewed and stable Respiratory status: spontaneous breathing Cardiovascular status: stable Postop Assessment: no apparent nausea or vomiting Anesthetic complications: no    Last Vitals:  Vitals:   10/24/18 1647 10/24/18 1916  BP: 117/84 110/73  Pulse: 65 (!) 59  Resp: 16 16  Temp: (!) 36.3 C 36.7 C  SpO2: 98% 97%    Last Pain:  Vitals:   10/24/18 1916  TempSrc: Oral  PainSc:    Pain Goal: Patients Stated Pain Goal: 2 (10/24/18 1119)                 [name]

## 2018-10-24 NOTE — Op Note (Signed)
10/24/2018  2:23 PM  PATIENT:  Howard [name]    PRE-OPERATIVE DIAGNOSIS:  Right shoulder rotator cuff arthropathy  POST-OPERATIVE DIAGNOSIS:  Same  PROCEDURE: 1. REVERSE SHOULDER ARTHROPLASTY, Right 2.  Right transfer of long head of biceps tendon to the pectoralis major tendon  SURGEON:  Nicholes Stairs, MD  ASSISTANT: Ky Barban, RNFA  ANESTHESIA:   General  ESTIMATED BLOOD LOSS: 350 cc  PREOPERATIVE INDICATIONS:  Howard [name] is a  53 y.o. male with a diagnosis of Right shoulder rotator cuff arthropathy who failed conservative measures and elected for surgical management.  He has had a recurrent tear of a previously fixed massive superior rotator cuff tear.  The risks benefits and alternatives were discussed with the patient preoperatively including but not limited to the risks of infection, bleeding, nerve injury, cardiopulmonary complications, the need for revision surgery, dislocation, brachial plexus palsy, incomplete relief of pain, among others, and the patient was willing to proceed.  OPERATIVE IMPLANTS: Biomet size 30mm mini humeral stem press-fit standard with a 40 mm reverse shoulder arthroplasty tray with a standard +6 liner and a 36 mm +3 glenosphere with a small augment superior/posterior (10 oclock) mini baseplate and 4 locking screws and one central nonlocking screw.  OPERATIVE FINDINGS: Massive recurrent superior rotator cuff tear with lytic changes in the humeral head and greater tuberosity at the location of the rotator cuff anchors.  High riding humeral head noted.  Posterior cuff intact.  Long head biceps was noted to be in the groove but absent from the joint.  OPERATIVE PROCEDURE: The patient was brought to the operating room and placed in the supine position. General anesthesia was administered. IV antibiotics were given. A Foley was not placed. Time out was performed. The upper extremity was prepped and draped in usual sterile fashion. The patient was  in a beachchair position. Deltopectoral approach was carried out. The biceps was transferred and tenodesed to the pectoralis tendon with #2 Fiberwire. The subscapularis was released off of the bone.   I then performed circumferential releases of the humerus, and then dislocated the head, and then reamed with the reamer to the above named size.  I then applied the jig, and cut the humeral head in 30 of retroversion, and then turned my attention to the glenoid.  Deep retractors were placed, and I resected the labrum, and then placed a guidepin into the center position on the glenoid, with slight inferior inclination. I then reamed over the guidepin, and this created a small metaphyseal cancellus blush inferiorly, removing just the cartilage to the subchondral bone superiorly. At this junction, based off of the preop CT we elected to place a superior augment with the small sized base augment in the 10 oclock position.  We used the second step reamer to prep and allow for minimal bone resection.  The base plate was selected and impacted place, and then I secured it centrally with a nonlocking screw, and I had excellent purchase both inferiorly and superiorly. I placed a short locking screws on anterior and posterior aspects.  I then turned my attention to the glenosphere, and impacted this into place, placing slight inferior offset (set on B).   The glenoid sphere was completely seated, and had engagement of the [facility] taper. I then turned my attention back to the humerus.  I sequentially broached, and then trialed, and was found to restore soft tissue tension, and it had 2 finger tightness. Therefore the above named components were selected. The  shoulder felt stable throughout functional motion.  Before I placed the real prosthesis I had also placed a total of 2 #2 FiberWire through drill holes in the humerus for later subscapularis repair.  I then impacted the real prosthesis into place, as well as the  real humeral tray, and reduced the shoulder. The shoulder had excellent motion, and was stable, and I irrigated the wounds copiously.    I then used these to repair the subscapularis. This came down to bone.  I then irrigated the shoulder copiously once more, repaired the deltopectoral interval with #2 fiberwire followed by subcutaneous Vicryl and then 3-0 subcuticular running suture for the skin with Steri-Strips and sterile gauze for the skin. An Aquacell dressing was applied to the shoulder.  The patient was awakened and returned back in stable and satisfactory condition. There no complications and He tolerated the procedure well.  Transported to PACU in stable condition.  Disposition: Howard [name] will be admitted to the floor postoperatively for routine total shoulder care.  He will be in his sling and nonweightbearing to the right upper extremity.  He may begin early passive and active range of motion with guidance from the occupational therapist.  We will limit external rotation to neutral but otherwise can perform forward elevation and abduction as tolerated.  Anticipate he will be discharged home tomorrow.

## 2018-10-24 NOTE — Brief Op Note (Signed)
10/24/2018  2:19 PM  PATIENT:  Howard [name]  53 y.o. male  PRE-OPERATIVE DIAGNOSIS:  Right shoulder rotator cuff arthropathy  POST-OPERATIVE DIAGNOSIS:  Right shoulder rotator cuff arthropathy  PROCEDURE:  Procedure(s): REVERSE SHOULDER ARTHROPLASTY (Right)  SURGEON:  Surgeon(s) and Role:    * Nicholes Stairs, MD - Primary  PHYSICIAN ASSISTANT:   ASSISTANTS: Ky Barban, RNFA   ANESTHESIA:   general  And regional  EBL:  350 mL   BLOOD ADMINISTERED:none  DRAINS: none   LOCAL MEDICATIONS USED:  NONE  SPECIMEN:  No Specimen  DISPOSITION OF SPECIMEN: N/A  COUNTS:  YES  TOURNIQUET:  * No tourniquets in log *  DICTATION: .Note written in EPIC  PLAN OF CARE: Admit to inpatient   PATIENT DISPOSITION:  PACU - hemodynamically stable.   Delay start of Pharmacological VTE agent (>24hrs) due to surgical blood loss or risk of bleeding: not applicable

## 2018-10-24 NOTE — H&P (Signed)
ORTHOPAEDIC H and P  REQUESTING PHYSICIAN: Nicholes Stairs, MD  PCP:  Redmond School, MD  Chief Complaint: Right shoulder rotator cuff arthropathy  HPI: Howard [name] is a 53 y.o. male who complains of right shoulder weakness and pain.  He has failed conservative management for his recurrent massive right shoulder rotator cuff tear.  He is here today for reverse shoulder arthroplasty.  We discussed other options such as superior capsule reconstruction.  However, he has opted for the reverse replacement.  He is here today for that surgery.  He has no new complaints.  He is ready to move forward.  Past Medical History:  Diagnosis Date  . Alcohol abuse   . Anxiety   . Chronic back pain   . Chronic pain   . Depression   . Fall   . Fracture of right elbow   . Osteoarthritis   . Psoriasis    Past Surgical History:  Procedure Laterality Date  . APPENDECTOMY    . LUMBAR LAMINECTOMY/DECOMPRESSION MICRODISCECTOMY Bilateral 12/31/2015   Procedure: LUMBER DECOMPRESSION L4-5/ L5-S1 BILATERALLY  ;  Surgeon: Susa Day, MD;  Location: [facility];  Service: Orthopedics;  Laterality: Bilateral;  . SHOULDER SURGERY Right   . TOTAL HIP ARTHROPLASTY    . TOTAL HIP ARTHROPLASTY Left 08/27/2014   Procedure: LEFT TOTAL HIP ARTHROPLASTY ANTERIOR APPROACH;  Surgeon: Paralee Cancel, MD;  Location: [facility];  Service: Orthopedics;  Laterality: Left;   Social History   Socioeconomic History  . Marital status: Married    Spouse name: Not on file  . Number of children: Not on file  . Years of education: Not on file  . Highest education level: Not on file  Occupational History  . Not on file  Social Needs  . Financial resource strain: Not on file  . Food insecurity:    Worry: Not on file    Inability: Not on file  . Transportation needs:    Medical: Not on file    Non-medical: Not on file  Tobacco Use  . Smoking status: Current Every Day Smoker    Packs/day: 1.00    Years: 32.00    Pack  years: 32.00    Types: Cigarettes  . Smokeless tobacco: Never Used  Substance and Sexual Activity  . Alcohol use: Not Currently    Alcohol/week: 16.0 standard drinks    Types: 6 Glasses of wine, 10 Cans of beer per week    Comment: None currently (last ETOH 03/01/2018); previously drinks 3 to 4 beers daily and 1 glass of wine; "way more" on the weekend  . Drug use: No    Comment: hx of marijuana use years ago   . Sexual activity: Not on file  Lifestyle  . Physical activity:    Days per week: Not on file    Minutes per session: Not on file  . Stress: Not on file  Relationships  . Social connections:    Talks on phone: Not on file    Gets together: Not on file    Attends religious service: Not on file    Active member of club or organization: Not on file    Attends meetings of clubs or organizations: Not on file    Relationship status: Not on file  Other Topics Concern  . Not on file  Social History Narrative  . Not on file   Family History  Problem Relation Age of Onset  . Colon cancer Neg Hx    No  Known Allergies Prior to Admission medications   Medication Sig Start Date End Date Taking? Authorizing Provider  amphetamine-dextroamphetamine (ADDERALL) 20 MG tablet Take 20 mg by mouth 2 (two) times daily with a meal.  07/14/18  Yes [provider]  ENBREL SURECLICK 50 MG/ML injection Inject 50 mg as directed once a week. Thursday 08/15/18  Yes [provider]  esomeprazole (NEXIUM) 40 MG capsule Take 40 mg by mouth See admin instructions. Every 2-3 days 06/26/18  Yes [provider]  hydrOXYzine (ATARAX/VISTARIL) 25 MG tablet Take 25 mg by mouth at bedtime.  07/05/18  Yes [provider]  ibuprofen (ADVIL,MOTRIN) 800 MG tablet Take 800 mg by mouth every 8 (eight) hours as needed.   Yes [provider]  levothyroxine (SYNTHROID, LEVOTHROID) 25 MCG tablet Take 25 mcg by mouth daily before breakfast.  06/26/18  Yes [provider]   methadone (DOLOPHINE) 10 MG/ML solution Take 110 mg by mouth daily.   Yes [provider]  aspirin EC 81 MG tablet Take 1 tablet (81 mg total) by mouth as needed (headache). May resume 4 days post-op Patient not taking: Reported on 10/18/2018 01/01/16   Dorothy SparkBissell, Jaclyn M, PA-C   No results found.  Positive ROS: All other systems have been reviewed and were otherwise negative with the exception of those mentioned in the HPI and as above.  Physical Exam: General: Alert, no acute distress Cardiovascular: No pedal edema Respiratory: No cyanosis, no use of accessory musculature GI: No organomegaly, abdomen is soft and non-tender Skin: No lesions in the area of chief complaint Neurologic: Sensation intact distally Psychiatric: Patient is competent for consent with normal mood and affect Lymphatic: No axillary or cervical lymphadenopathy  MUSCULOSKELETAL:  Right shoulder:  No open wounds.  Skin is warm and well-perfused.  Assessment: Right shoulder rotator cuff arthropathy  Plan: -Plan for right shoulder reverse arthroplasty today.  We have previously discussed this at length.  We again reviewed the risk, benefits, and indications of this procedure. -After full discussion of the risk of bleeding, infection, dislocation, fracture, need for further surgery, persistent pain, stiffness, and the risk of anesthesia he has provided informed consent.  -We will admit postoperatively for routine perioperative care.  Anticipate discharge home tomorrow.    Yolonda KidaJason Patrick Rogers, MD Cell [phone]    10/24/2018 12:07 PM

## 2018-10-24 NOTE — Plan of Care (Signed)

## 2018-10-24 NOTE — Anesthesia Procedure Notes (Signed)
Procedure Name: Intubation Date/Time: 10/24/2018 12:37 PM Performed by: Raenette Rover, CRNA Pre-anesthesia Checklist: Patient identified, Emergency Drugs available, Suction available and Patient being monitored Patient Re-evaluated:Patient Re-evaluated prior to induction Oxygen Delivery Method: Circle system utilized Preoxygenation: Pre-oxygenation with 100% oxygen Induction Type: IV induction and Rapid sequence Laryngoscope Size: Mac and 4 Grade View: Grade I Tube type: Oral Tube size: 7.5 mm Number of attempts: 1 Airway Equipment and Method: Stylet Placement Confirmation: ETT inserted through vocal cords under direct vision,  positive ETCO2,  CO2 detector and breath sounds checked- equal and bilateral Secured at: 23 cm Tube secured with: Tape Dental Injury: Teeth and Oropharynx as per pre-operative assessment

## 2018-10-24 NOTE — Anesthesia Preprocedure Evaluation (Signed)
Anesthesia Evaluation  Patient identified by MRN, date of birth, ID band Patient awake    Reviewed: Allergy & Precautions, NPO status , Patient's Chart, lab work & pertinent test results  Airway Mallampati: I       Dental no notable dental hx. (+) Teeth Intact   Pulmonary Current Smoker,    Pulmonary exam normal breath sounds clear to auscultation       Cardiovascular negative cardio ROS Normal cardiovascular exam Rhythm:Regular Rate:Normal     Neuro/Psych PSYCHIATRIC DISORDERS Anxiety Depression negative neurological ROS     GI/Hepatic negative GI ROS, (+)     substance abuse  alcohol use,   Endo/Other    Renal/GU negative Renal ROS  negative genitourinary   Musculoskeletal  (+) Arthritis , Osteoarthritis,    Abdominal Normal abdominal exam  (+)   Peds  Hematology negative hematology ROS (+)   Anesthesia Other Findings   Reproductive/Obstetrics                             Anesthesia Physical Anesthesia Plan  ASA: II  Anesthesia Plan: General   Post-op Pain Management:    Induction: Intravenous  PONV Risk Score and Plan: 1 and Ondansetron and Dexamethasone  Airway Management Planned: Oral ETT  Additional Equipment:   Intra-op Plan:   Post-operative Plan: Extubation in OR  Informed Consent: I have reviewed the patients History and Physical, chart, labs and discussed the procedure including the risks, benefits and alternatives for the proposed anesthesia with the patient or authorized representative who has indicated his/her understanding and acceptance.     Dental advisory given  Plan Discussed with: CRNA  Anesthesia Plan Comments:         Anesthesia Quick Evaluation

## 2018-10-24 NOTE — Transfer of Care (Signed)
Immediate Anesthesia Transfer of Care Note  Patient: Howard [name]  Procedure(s) Performed: REVERSE SHOULDER ARTHROPLASTY (Right Shoulder)  Patient Location: PACU  Anesthesia Type:General  Level of Consciousness: sedated  Airway & Oxygen Therapy: Patient Spontanous Breathing, Patient connected to nasal cannula oxygen and oral airway  Post-op Assessment: Report given to RN and Post -op Vital signs reviewed and stable  Post vital signs: Reviewed and stable  Last Vitals:  Vitals Value Taken Time  BP 92/60 10/24/2018  2:53 PM  Temp    Pulse 64 10/24/2018  2:55 PM  Resp 9 10/24/2018  2:55 PM  SpO2 100 % 10/24/2018  2:55 PM  Vitals shown include unvalidated device data.  Last Pain:  Vitals:   10/24/18 1119  TempSrc:   PainSc: 0-No pain      Patients Stated Pain Goal: 2 (00/86/76 1950)  Complications: No apparent anesthesia complications

## 2018-10-24 NOTE — Anesthesia Procedure Notes (Signed)
Anesthesia Regional Block: Interscalene brachial plexus block   Pre-Anesthetic Checklist: ,, timeout performed, Correct Patient, Correct Site, Correct Laterality, Correct Procedure, Correct Position, site marked, Risks and benefits discussed,  Surgical consent,  Pre-op evaluation,  At surgeon's request and post-op pain management  Laterality: Right and Upper  Prep: chloraprep       Needles:  Injection technique: Single-shot  Needle Type: Echogenic Stimulator Needle     Needle Length: 10cm  Needle Gauge: 21   Needle insertion depth: 1.5 cm   Additional Needles:   Procedures:,,,, ultrasound used (permanent image in chart),,,,  Narrative:  Start time: 10/24/2018 11:30 AM End time: 10/24/2018 11:38 AM Injection made incrementally with aspirations every 5 mL.  Performed by: Personally  Anesthesiologist: Lyn Hollingshead, MD

## 2018-10-25 ENCOUNTER — Encounter (HOSPITAL_COMMUNITY): Payer: Self-pay | Admitting: General Practice

## 2018-10-25 LAB — HEMOGLOBIN AND HEMATOCRIT, BLOOD
HCT: 38.4 % — ABNORMAL LOW (ref 39.0–52.0)
Hemoglobin: 12.9 g/dL — ABNORMAL LOW (ref 13.0–17.0)

## 2018-10-25 MED ORDER — OXYCODONE HCL 5 MG PO TABS: 5.0000 mg | ORAL_TABLET | ORAL | 0 refills | Status: AC | PRN

## 2018-10-25 MED ORDER — ONDANSETRON 4 MG PO TBDP: 4.0000 mg | ORAL_TABLET | Freq: Three times a day (TID) | ORAL | 0 refills | Status: DC | PRN

## 2018-10-25 NOTE — Progress Notes (Signed)
   Subjective:  Patient reports pain as mild.  Doing very well.  OT worked with already.  Denies, SOB/CP/V/N.  Objective:   VITALS:   Vitals:   10/24/18 1916 10/24/18 2340 10/25/18 0536 10/25/18 0711  BP: 110/73 94/66 100/68 106/75  Pulse: (!) 59 63 64 (!) 59  Resp: 16 16 16 16   Temp: 98 F (36.7 C) 98.2 F (36.8 C) 97.9 F (36.6 C) 98.2 F (36.8 C)  TempSrc: Oral Oral Oral Oral  SpO2: 97% 95% 95% 99%  Weight:      Height:        Neurologically intact Sensation intact distally Intact pulses distally Incision: dressing C/D/I No cellulitis present Compartment soft   Lab Results  Component Value Date   WBC 5.5 10/23/2018   HGB 12.9 (L) 10/25/2018   HCT 38.4 (L) 10/25/2018   MCV 92.4 10/23/2018   PLT 168 10/23/2018   BMET    Component Value Date/Time   NA 139 10/23/2018 1141   K 4.3 10/23/2018 1141   CL 106 10/23/2018 1141   CO2 26 10/23/2018 1141   GLUCOSE 103 (H) 10/23/2018 1141   BUN 19 10/23/2018 1141   CREATININE 0.62 10/23/2018 1141   CREATININE 0.58 (L) 03/27/2015 1008   CALCIUM 8.7 (L) 10/23/2018 1141   GFRNONAA >60 10/23/2018 1141   GFRNONAA >89 03/27/2015 1008   GFRAA >60 10/23/2018 1141   GFRAA >89 03/27/2015 1008     Assessment/Plan: 1 Day Post-Op   Active Problems:   Rotator cuff arthropathy of right shoulder   S/P reverse total shoulder arthroplasty, right   Up with therapy  D/c home today Follow up in 2 weeks.   Nicholes Stairs 10/25/2018, 12:03 PM   Geralynn Rile, MD [phone]

## 2018-10-25 NOTE — Discharge Summary (Signed)
Patient ID: Howard [name] MRN: [id number] DOB/AGE: [birth year] 53 y.o.  Admit date: 10/24/2018 Discharge date: 10/25/2018  Primary Diagnosis: Right shoulder rotator cuff arthropathy  Admission Diagnoses:  Past Medical History:  Diagnosis Date  . Alcohol abuse   . Anxiety   . Chronic back pain   . Chronic pain   . Depression   . Fall   . Fracture of right elbow   . Osteoarthritis   . Psoriasis    Discharge Diagnoses:   Active Problems:   Rotator cuff arthropathy of right shoulder   S/P reverse total shoulder arthroplasty, right  Estimated body mass index is 27.12 kg/m as calculated from the following:   Height as of this encounter: 6' (1.829 m).   Weight as of this encounter: 90.7 kg.  Procedure:  Procedure(s) (LRB): REVERSE SHOULDER ARTHROPLASTY (Right)   Consults: None  HPI: Admitted for routine perioperative care following right shoulder reverse arthroplasty. Laboratory Data: Admission on 10/24/2018, Discharged on 10/25/2018  Component Date Value Ref Range Status  . Hemoglobin 10/25/2018 12.9* 13.0 - 17.0 g/dL Final  . HCT 14/78/295606/02/2019 38.4* 39.0 - 52.0 % Final   Performed at [facility], [address] [place], [place] [address]  Hospital Outpatient Visit on 10/23/2018  Component Date Value Ref Range Status  . SARS Coronavirus 2 10/23/2018 NEGATIVE  NEGATIVE Final   Comment: (NOTE) If result is NEGATIVE SARS-CoV-2 target nucleic acids are NOT DETECTED. The SARS-CoV-2 RNA is generally detectable in upper and lower  respiratory specimens during the acute phase of infection. The lowest  concentration of SARS-CoV-2 viral copies this assay can detect is 250  copies / mL. A negative result does not preclude SARS-CoV-2 infection  and should not be used as the sole basis for treatment or other  patient management decisions.  A negative result may occur with  improper specimen collection / handling, submission of specimen other  than nasopharyngeal swab,  presence of viral mutation(s) within the  areas targeted by this assay, and inadequate number of viral copies  (<250 copies / mL). A negative result must be combined with clinical  observations, patient history, and epidemiological information. If result is POSITIVE SARS-CoV-2 target nucleic acids are DETECTED. The SARS-CoV-2 RNA is generally detectable in upper and lower  respiratory specimens dur                          ing the acute phase of infection.  Positive  results are indicative of active infection with SARS-CoV-2.  Clinical  correlation with patient history and other diagnostic information is  necessary to determine patient infection status.  Positive results do  not rule out bacterial infection or co-infection with other viruses. If result is PRESUMPTIVE POSTIVE SARS-CoV-2 nucleic acids MAY BE PRESENT.   A presumptive positive result was obtained on the submitted specimen  and confirmed on repeat testing.  While 2019 novel coronavirus  (SARS-CoV-2) nucleic acids may be present in the submitted sample  additional confirmatory testing may be necessary for epidemiological  and / or clinical management purposes  to differentiate between  SARS-CoV-2 and other Sarbecovirus currently known to infect humans.  If clinically indicated additional testing with an alternate test  methodology [phone]([id number]) is advised. The SARS-CoV-2 RNA is generally  detectable in upper and lower respiratory sp                          ecimens  during the acute  phase of infection. The expected result is Negative. Fact Sheet for Patients:  BoilerBrush.com.cyhttps://www.fda.gov/media/136312/download Fact Sheet for Healthcare Providers: https://pope.com/https://www.fda.gov/media/136313/download This test is not yet approved or cleared by the [place] FDA and has been authorized for detection and/or diagnosis of SARS-CoV-2 by FDA under an Emergency Use Authorization (EUA).  This EUA will remain in effect (meaning this test can be used)  for the duration of the COVID-19 declaration under Section 564(b)(1) of the Act, 21 U.S.C. section 360bbb-3(b)(1), unless the authorization is terminated or revoked sooner. Performed at [facility], [address] [place], [place] [address]   Hospital Outpatient Visit on 10/23/2018  Component Date Value Ref Range Status  . MRSA, PCR 10/23/2018 NEGATIVE  NEGATIVE Final  . Staphylococcus aureus 10/23/2018 NEGATIVE  NEGATIVE Final   Comment: (NOTE) The Xpert SA Assay (FDA approved for NASAL specimens in patients 51 years of age and older), is one component of a comprehensive surveillance program. It is not intended to diagnose infection nor to guide or monitor treatment. Performed at [facility], [address] [place], [place] [address]   . Sodium 10/23/2018 139  135 - 145 mmol/L Final  . Potassium 10/23/2018 4.3  3.5 - 5.1 mmol/L Final  . Chloride 10/23/2018 106  98 - 111 mmol/L Final  . CO2 10/23/2018 26  22 - 32 mmol/L Final  . Glucose, Bld 10/23/2018 103* 70 - 99 mg/dL Final  . BUN 82/95/621306/12/2018 19  6 - 20 mg/dL Final  . Creatinine, Ser 10/23/2018 0.62  0.61 - 1.24 mg/dL Final  . Calcium 08/65/784606/12/2018 8.7* 8.9 - 10.3 mg/dL Final  . GFR calc non Af Amer 10/23/2018 >60  >60 mL/min Final  . GFR calc Af Amer 10/23/2018 >60  >60 mL/min Final  . Anion gap 10/23/2018 7  5 - 15 Final   Performed at [facility], [address] [place], [place] [address]  . WBC 10/23/2018 5.5  4.0 - 10.5 K/uL Final  . RBC 10/23/2018 4.33  4.22 - 5.81 MIL/uL Final  . Hemoglobin 10/23/2018 13.4  13.0 - 17.0 g/dL Final  . HCT 28/41/324406/12/2018 40.0  39.0 - 52.0 % Final  . MCV 10/23/2018 92.4  80.0 - 100.0 fL Final  . MCH 10/23/2018 30.9  26.0 - 34.0 pg Final  . MCHC 10/23/2018 33.5  30.0 - 36.0 g/dL Final  . RDW 01/02/725306/12/2018 12.9  11.5 - 15.5 % Final  . Platelets 10/23/2018 168  150 - 400 K/uL Final  . nRBC 10/23/2018 0.0  0.0 - 0.2 % Final   Performed at [facility],  [address] [place], [place] [address]     X-Rays:Dg Shoulder Right Port  Result Date: 10/24/2018 CLINICAL DATA:  Status post reverse shoulder total arthroplasty. EXAM: PORTABLE RIGHT SHOULDER COMPARISON:  CT of the RIGHT shoulder, 08/07/2018. FINDINGS: The patient has undergone a reverse shoulder total arthroplasty. IMPRESSION: Satisfactory postoperative appearance. Electronically Signed   By: Elsie StainJohn T Curnes M.D.   On: 10/24/2018 15:44    EKG: Orders placed or performed during the hospital encounter of 08/27/14  . EKG 12-Lead  . EKG 12-Lead  . EKG 12-Lead     Hospital Course: Howard [name] is a 53 y.o. who was admitted to Hospital. They were brought to the operating room on 10/24/2018 and underwent Procedure(s): REVERSE SHOULDER ARTHROPLASTY.  Patient tolerated the procedure well and was later transferred to the recovery room and then to the orthopaedic floor  for postoperative care.  They were given PO and IV analgesics for pain control following their surgery.  They were given 24 hours of postoperative antibiotics of  Anti-infectives (From admission, onward)   Start     Dose/Rate Route Frequency Ordered Stop   10/24/18 1830  ceFAZolin (ANCEF) IVPB 1 g/50 mL premix     1 g 100 mL/hr over 30 Minutes Intravenous Every 6 hours 10/24/18 1642 10/25/18 0841   10/24/18 1408  vancomycin (VANCOCIN) powder  Status:  Discontinued       As needed 10/24/18 1408 10/24/18 1445   10/24/18 1115  ceFAZolin (ANCEF) IVPB 2g/100 mL premix     2 g 200 mL/hr over 30 Minutes Intravenous On call to O.R. 10/24/18 1108 10/24/18 1230   10/24/18 1112  ceFAZolin (ANCEF) 2-4 GM/100ML-% IVPB    Note to Pharmacy:  [facility]   : cabinet override      10/24/18 1112 10/24/18 1230     and started on DVT prophylaxis in the form of Aspirin.   OT was ordered for total joint protocol.  Discharge planning consulted to help with postop disposition and equipment needs.  Patient had a good night on the evening of surgery.   By the midday of day 1, the patient had progressed with therapy and meeting their goals.  Incision was healing well.  Patient was seen in rounds and was ready to go home.   Diet: Regular diet Activity:NWB Follow-up:in 2 weeks Disposition - Home Discharged Condition: good   Discharge Instructions    Call MD / Call 911   Complete by:  As directed    If you experience chest pain or shortness of breath, CALL 911 and be transported to the hospital emergency room.  If you develope a fever above 101 F, pus (white drainage) or increased drainage or redness at the wound, or calf pain, call your surgeon's office.   Call MD / Call 911   Complete by:  As directed    If you experience chest pain or shortness of breath, CALL 911 and be transported to the hospital emergency room.  If you develope a fever above 101 F, pus (white drainage) or increased drainage or redness at the wound, or calf pain, call your surgeon's office.   Constipation Prevention   Complete by:  As directed    Drink plenty of fluids.  Prune juice may be helpful.  You may use a stool softener, such as Colace (over the counter) 100 mg twice a day.  Use MiraLax (over the counter) for constipation as needed.   Constipation Prevention   Complete by:  As directed    Drink plenty of fluids.  Prune juice may be helpful.  You may use a stool softener, such as Colace (over the counter) 100 mg twice a day.  Use MiraLax (over the counter) for constipation as needed.   Diet - low sodium heart healthy   Complete by:  As directed    Diet - low sodium heart healthy   Complete by:  As directed    Increase activity slowly as tolerated   Complete by:  As directed    Increase activity slowly as tolerated   Complete by:  As directed      Allergies as of 10/25/2018   No Known Allergies     Medication List    TAKE these medications   amphetamine-dextroamphetamine 20 MG tablet Commonly known as:  ADDERALL Take 20 mg by mouth 2 (two) times  daily with a meal.   aspirin EC 81 MG tablet Take 1 tablet (81 mg total) by mouth as needed (headache). May resume 4 days post-op   Enbrel SureClick 50 MG/ML injection Generic drug:  etanercept Inject 50 mg as directed once a week. Thursday   esomeprazole 40 MG capsule Commonly known as:  NEXIUM Take 40 mg by mouth See admin instructions. Every 2-3 days   hydrOXYzine 25 MG tablet Commonly known as:  ATARAX/VISTARIL Take 25 mg by mouth at bedtime.   ibuprofen 800 MG tablet Commonly known as:  ADVIL Take 800 mg by mouth every 8 (eight) hours as needed.   levothyroxine 25 MCG tablet Commonly known as:  SYNTHROID Take 25 mcg by mouth daily before breakfast.   methadone 10 MG/ML solution Commonly known as:  DOLOPHINE Take 110 mg by mouth daily.   ondansetron 4 MG disintegrating tablet Commonly known as:  Zofran ODT Take 1 tablet (4 mg total) by mouth every 8 (eight) hours as needed.   oxyCODONE 5 MG immediate release tablet Commonly known as:  Roxicodone Take 1 tablet (5 mg total) by mouth every 4 (four) hours as needed for up to 7 days for moderate pain or severe pain.      Follow-up Information    Yolonda Kidaogers, Loreli Debruler Patrick, MD.   Specialty:  Orthopedic Surgery Contact information: [address] [place] [address] [phone]           Signed: Maryan RuedJason P Tereka Thorley, MD Orthopaedic Surgery 10/25/2018, 1:11 PM

## 2018-10-25 NOTE — Progress Notes (Signed)
Pt given discharge instructions and gone over with him. All questions answered. All belongings gathered to be sent home.

## 2018-10-25 NOTE — Evaluation (Signed)
Occupational Therapy Evaluation and Discharge Patient Details Name: Howard FowlerJohn R Lizardi MRN: [id number] DOB: July 18, 1965 Today's Date: 10/25/2018    History of Present Illness s/p R reverse TSA secondary to rotator cuff arthropathy   Clinical Impression   All education completed and performed R shoulder self ROM as per MD parameters as well as AROM R elbow to hand. Reinforced compensatory strategies, exercises, positioning and precautions with written handouts. Pt verbalizing understanding. Eager to go home.    Follow Up Recommendations  Follow surgeon's recommendation for DC plan and follow-up therapies    Equipment Recommendations  None recommended by OT    Recommendations for Other Services       Precautions / Restrictions Precautions Precautions: Shoulder Type of Shoulder Precautions: PROM FF 90, ABD 60, ER to neutral Shoulder Interventions: Shoulder sling/immobilizer;Off for dressing/bathing/exercises Precaution Booklet Issued: Yes (comment) Required Braces or Orthoses: Sling Restrictions Weight Bearing Restrictions: Yes RUE Weight Bearing: Non weight bearing      Mobility Bed Mobility                  Transfers Overall transfer level: Independent Equipment used: None                  Balance                                           ADL either performed or assessed with clinical judgement   ADL Overall ADL's : Modified independent                                       General ADL Comments: Educated in compensatory strategies for ADL, AROM R elbow to hand, positioning R UE in bed and chair, NWB      Vision Patient Visual Report: No change from baseline       Perception     Praxis      Pertinent Vitals/Pain Pain Assessment: 0-10 Pain Score: 6  Pain Location: R shoulder with exercises Pain Descriptors / Indicators: Sore;Operative site guarding;Grimacing Pain Intervention(s): Monitored during  session;Repositioned;Ice applied     Hand Dominance Right   Extremity/Trunk Assessment Upper Extremity Assessment Upper Extremity Assessment: RUE deficits/detail RUE Deficits / Details: performed self ROM in supine to pain tolerance per MD parameters RUE Coordination: decreased gross motor   Lower Extremity Assessment Lower Extremity Assessment: Overall WFL for tasks assessed   Cervical / Trunk Assessment Cervical / Trunk Assessment: Normal   Communication Communication Communication: No difficulties   Cognition Arousal/Alertness: Awake/alert Behavior During Therapy: WFL for tasks assessed/performed Overall Cognitive Status: Within Functional Limits for tasks assessed                                     General Comments       Exercises     Shoulder Instructions      Home Living Family/patient expects to be discharged to:: Private residence Living Arrangements: Spouse/significant other Available Help at Discharge: Family;Available 24 hours/day                         Home Equipment: None          Prior Functioning/Environment Level of Independence: Independent  OT Problem List:        OT Treatment/Interventions:      OT Goals(Current goals can be found in the care plan section) Acute Rehab OT Goals Patient Stated Goal: regain use of R shoulder  OT Frequency:     Barriers to D/C:            Co-evaluation              AM-PAC OT "6 Clicks" Daily Activity     Outcome Measure Help from another person eating meals?: None Help from another person taking care of personal grooming?: None Help from another person toileting, which includes using toliet, bedpan, or urinal?: None Help from another person bathing (including washing, rinsing, drying)?: None Help from another person to put on and taking off regular upper body clothing?: None Help from another person to put on and taking off regular lower body  clothing?: None 6 Click Score: 24   End of Session    Activity Tolerance: Patient tolerated treatment well Patient left: in chair;with call bell/phone within reach  OT Visit Diagnosis: Pain Pain - Right/Left: Right Pain - part of body: Shoulder                Time: 0160-1093 OT Time Calculation (min): 27 min Charges:  OT General Charges $OT Visit: 1 Visit OT Evaluation $OT Eval Low Complexity: 1 Low OT Treatments $Therapeutic Activity: 8-22 mins  Nestor Lewandowsky, OTR/L Acute Rehabilitation Services Pager: [phone] Office: [phone]  Malka So 10/25/2018, 9:10 AM

## 2018-10-26 ENCOUNTER — Encounter (HOSPITAL_COMMUNITY): Payer: Self-pay | Admitting: Orthopedic Surgery

## 2018-12-19 ENCOUNTER — Ambulatory Visit: Payer: 59 | Admitting: Podiatry

## 2018-12-26 ENCOUNTER — Ambulatory Visit: Payer: 59 | Admitting: Podiatry

## 2018-12-26 ENCOUNTER — Ambulatory Visit (INDEPENDENT_AMBULATORY_CARE_PROVIDER_SITE_OTHER): Payer: 59

## 2018-12-26 ENCOUNTER — Other Ambulatory Visit: Payer: Self-pay

## 2018-12-26 ENCOUNTER — Encounter: Payer: Self-pay | Admitting: Podiatry

## 2018-12-26 VITALS — Temp 97.6°F

## 2018-12-26 DIAGNOSIS — M2042 Other hammer toe(s) (acquired), left foot: Secondary | ICD-10-CM | POA: Diagnosis not present

## 2018-12-26 DIAGNOSIS — M722 Plantar fascial fibromatosis: Secondary | ICD-10-CM

## 2018-12-26 NOTE — Patient Instructions (Signed)
Pre-Operative Instructions  Congratulations, you have decided to take an important step towards improving your quality of life.  You can be assured that the doctors and staff at [facility] will be with you every step of the way.  Here are some important things you should know:  1. Plan to be at the surgery center/hospital at least 1 (one) hour prior to your scheduled time, unless otherwise directed by the surgical center/hospital staff.  You must have a responsible adult accompany you, remain during the surgery and drive you home.  Make sure you have directions to the surgical center/hospital to ensure you arrive on time. 2. If you are having surgery at [facility] or [facility] hospitals, you will need a copy of your medical history and physical form from your family physician within one month prior to the date of surgery. We will give you a form for your primary physician to complete.  3. We make every effort to accommodate the date you request for surgery.  However, there are times where surgery dates or times have to be moved.  We will contact you as soon as possible if a change in schedule is required.   4. No aspirin/ibuprofen for one week before surgery.  If you are on aspirin, any non-steroidal anti-inflammatory medications (Mobic, Aleve, Ibuprofen) should not be taken seven (7) days prior to your surgery.  You make take Tylenol for pain prior to surgery.  5. Medications - If you are taking daily heart and blood pressure medications, seizure, reflux, allergy, asthma, anxiety, pain or diabetes medications, make sure you notify the surgery center/hospital before the day of surgery so they can tell you which medications you should take or avoid the day of surgery. 6. No food or drink after midnight the night before surgery unless directed otherwise by surgical center/hospital staff. 7. No alcoholic beverages 24-hours prior to surgery.  No smoking 24-hours prior or 24-hours after  surgery. 8. Wear loose pants or shorts. They should be loose enough to fit over bandages, boots, and casts. 9. Don't wear slip-on shoes. Sneakers are preferred. 10. Bring your boot with you to the surgery center/hospital.  Also bring crutches or a walker if your physician has prescribed it for you.  If you do not have this equipment, it will be provided for you after surgery. 11. If you have not been contacted by the surgery center/hospital by the day before your surgery, call to confirm the date and time of your surgery. 12. Leave-time from work may vary depending on the type of surgery you have.  Appropriate arrangements should be made prior to surgery with your employer. 13. Prescriptions will be provided immediately following surgery by your doctor.  Fill these as soon as possible after surgery and take the medication as directed. Pain medications will not be refilled on weekends and must be approved by the doctor. 14. Remove nail polish on the operative foot and avoid getting pedicures prior to surgery. 15. Wash the night before surgery.  The night before surgery wash the foot and leg well with water and the antibacterial soap provided. Be sure to pay special attention to beneath the toenails and in between the toes.  Wash for at least three (3) minutes. Rinse thoroughly with water and dry well with a towel.  Perform this wash unless told not to do so by your physician.  Enclosed: 1 Ice pack (please put in freezer the night before surgery)   1 Hibiclens skin cleaner     Pre-op instructions  If you have any questions regarding the instructions, please do not hesitate to call our office.  [place]: [address], [place], [place] [address] -- [phone]  [place]: [address] [place], [place] [address] -- [phone]  [place]: [address]  [place], [place] [address] -- [phone]  [place]: [address], [address], [place], [place] [address] -- [phone]  Website:  [email or web address] 

## 2018-12-27 ENCOUNTER — Telehealth: Payer: Self-pay | Admitting: *Deleted

## 2018-12-27 ENCOUNTER — Encounter: Payer: Self-pay | Admitting: Podiatry

## 2018-12-27 ENCOUNTER — Encounter: Payer: Self-pay | Admitting: *Deleted

## 2018-12-27 NOTE — Progress Notes (Signed)
He presents today for follow-up of his plantar fibromas and his hammertoes on his left foot.  States them still having a whole lot of problems with his fibromas to a lesser degree the hammertoes.  Objective: Vital signs are stable alert and oriented x3.  Pulses are palpable.  He has small plantar fibromas though they are x4 on the plantar aspect of the medial band plantar fascial.  They are painful on palpation there are no open lesions or wounds.  Severe hammertoe deformities associated with psoriatic arthritis left foot.  Assessment: Hammertoe deformities dislocated metatarsal phalangeal joint secondary to psoriatic arthritis severe painful plantar fibromas left foot.  Plan: Discussed etiology pathology conservative versus surgical therapies at this point consented him for excision of plantar fibroma's time for plantar aspect left foot.  We did discuss the possible postop complications which may include but are not limited to postop pain bleeding swelling infection recurrence overcorrection under correction loss of digit loss of limb loss of life.  I expressed to him that I would not want to perform the hammertoe surgery the same time they were performing the plantar surgery.  He understands and is amenable to it.  He will be nonweightbearing utilizing a cam walker or a knee scooter he understands this is amenable to it.  We dispensed both oral and written home-going instructions for the morning of preop the surgery center and anesthesia group.  We also dispensed a Cam walker.  Follow-up with him in the near future for surgery.  Requested medical clearance from Dr. Lenna Gilford and whether to discontinue his Enbrel.

## 2018-12-27 NOTE — Telephone Encounter (Signed)
I attempted to call the patient.  I left him messages to call me tomorrow to give me that name of his Rheumatologist.

## 2018-12-28 ENCOUNTER — Encounter: Payer: Self-pay | Admitting: *Deleted

## 2018-12-28 ENCOUNTER — Telehealth: Payer: Self-pay | Admitting: *Deleted

## 2018-12-28 NOTE — Telephone Encounter (Signed)
I am calling to get the name of your Rheumatologist.  "I missed you yesterday, didn't I?  I think I gave him the wrong name, didn't I?"  Yes, I think you did.  "I'm sorry.  It's Leafy Kindle at [facility].  Would you like her phone number?"  Yes, that would be great.  "It is [phone]."  Per Dr. Milinda Pointer, I sent a surgical medical clearance request letter to Dr. Annamaria Boots.

## 2018-12-28 NOTE — Telephone Encounter (Signed)
DOS 01/05/2019; PLANTAR FIBROMA LT FOOT - 28062  [employer]: Effective Date - 06/17/2018 - 05/17/2019  Individual In-Network (Calendar Year) Deductible Deductible has been met  $0.00 remaining  $750.00 Plan Amt.   Out-of-Pocket Out-of-Pocket Maximum has been met  $0.00 remaining  $3,000.00 Plan Amt.  Professional Fees for Surgical and Medical Services 80% of eligible expenses after satisfying $750 deductible.    This Careers adviser does not currently require a prior authorization for these services. If you have general questions about the prior authorization requirements, please call us at [phone] or visit [email or web address] > Clinician Resources > Advance and Admission Notification Requirements. The number above acknowledges your notification. Please write this number down for future reference. Notification is not a guarantee of coverage or payment.  Decision ID #:[id number]

## 2018-12-28 NOTE — Progress Notes (Signed)
Per Dr. Milinda Pointer, I sent a surgical medical clearance request letter to Leafy Kindle, [place].  [name] is scheduled for surgery on 01/05/2019.

## 2019-01-03 ENCOUNTER — Telehealth: Payer: Self-pay | Admitting: *Deleted

## 2019-01-03 NOTE — Telephone Encounter (Signed)
I am returning your call.  "I got what I needed.  I was wanting to know if I was still on for the surgery.  They called and told me what I needed to know."

## 2019-01-03 NOTE — Telephone Encounter (Signed)
I am calling you to see if you have stopped your Enbrel.  "I stopped taking it about a week ago."  Okay great, your doctor sent [place] a letter today stating you need to be off it for a week prior to your surgery date.  She said you can resume taking it once your sutures are out and there's no sign of infection.  "Alright, thanks for letting me know."

## 2019-01-03 NOTE — Telephone Encounter (Signed)
"  I have surgery scheduled for Friday with Dr. Milinda Pointer.  I still haven't heard anybody.  I just want to make sure everything is okay."

## 2019-01-04 ENCOUNTER — Other Ambulatory Visit: Payer: Self-pay | Admitting: Podiatry

## 2019-01-04 MED ORDER — OXYCODONE-ACETAMINOPHEN 10-325 MG PO TABS: 1.0000 | ORAL_TABLET | Freq: Three times a day (TID) | ORAL | 0 refills | Status: DC | PRN

## 2019-01-04 MED ORDER — CEPHALEXIN 500 MG PO CAPS: 500.0000 mg | ORAL_CAPSULE | Freq: Three times a day (TID) | ORAL | 0 refills | Status: DC

## 2019-01-04 MED ORDER — ONDANSETRON HCL 4 MG PO TABS: 4.0000 mg | ORAL_TABLET | Freq: Three times a day (TID) | ORAL | 0 refills | Status: DC | PRN

## 2019-01-05 ENCOUNTER — Encounter: Payer: Self-pay | Admitting: Podiatry

## 2019-01-05 DIAGNOSIS — M722 Plantar fascial fibromatosis: Secondary | ICD-10-CM

## 2019-01-09 ENCOUNTER — Ambulatory Visit (INDEPENDENT_AMBULATORY_CARE_PROVIDER_SITE_OTHER): Payer: 59 | Admitting: Podiatry

## 2019-01-09 ENCOUNTER — Other Ambulatory Visit: Payer: Self-pay

## 2019-01-09 ENCOUNTER — Encounter: Payer: Self-pay | Admitting: Podiatry

## 2019-01-09 VITALS — Temp 97.6°F

## 2019-01-09 DIAGNOSIS — M722 Plantar fascial fibromatosis: Secondary | ICD-10-CM

## 2019-01-09 DIAGNOSIS — Z09 Encounter for follow-up examination after completed treatment for conditions other than malignant neoplasm: Secondary | ICD-10-CM

## 2019-01-09 MED ORDER — OXYCODONE-ACETAMINOPHEN 5-325 MG PO TABS: 1.0000 | ORAL_TABLET | ORAL | 0 refills | Status: DC | PRN

## 2019-01-15 ENCOUNTER — Telehealth: Payer: Self-pay | Admitting: *Deleted

## 2019-01-15 NOTE — Telephone Encounter (Signed)
Pt states he had surgery about 1 week ago and wanted to know if he could take the dressing off.

## 2019-01-15 NOTE — Telephone Encounter (Signed)
I informed pt the dressing needed to remain intact until he was seen in office again.

## 2019-01-23 ENCOUNTER — Ambulatory Visit (INDEPENDENT_AMBULATORY_CARE_PROVIDER_SITE_OTHER): Payer: Self-pay | Admitting: Podiatry

## 2019-01-23 ENCOUNTER — Other Ambulatory Visit: Payer: Self-pay

## 2019-01-23 DIAGNOSIS — Z09 Encounter for follow-up examination after completed treatment for conditions other than malignant neoplasm: Secondary | ICD-10-CM

## 2019-01-23 DIAGNOSIS — M722 Plantar fascial fibromatosis: Secondary | ICD-10-CM

## 2019-01-23 NOTE — Progress Notes (Signed)
Subjective: Howard [name] is a 53 y.o. is seen today in office s/p left foot plantar fibroma excision preformed on 01/05/2019. They state their pain is managable and not taking any medication. Denies any systemic complaints such as fevers, chills, nausea, vomiting. No calf pain, chest pain, shortness of breath.   Objective: General: No acute distress, AAOx3  DP/PT pulses palpable 2/4, CRT < 3 sec to all digits.  Protective sensation intact. Motor function intact.  Left foot: Incision is well coapted without any evidence of dehiscence and suture/staples intact. There is no surrounding erythema, ascending cellulitis, fluctuance, crepitus, malodor, drainage/purulence. There is mild edema around the surgical site. There is no pain along the surgical site.  No other areas of tenderness to bilateral lower extremities.  No other open lesions or pre-ulcerative lesions.  No pain with calf compression, swelling, warmth, erythema.   Assessment and Plan:  Status post left foot plantar fibroma excision, doing well with no complications   -Treatment options discussed including all alternatives, risks, and complications -Incision appears to be healing well there is still some mild motion across the incision so therefore left the sutures, staples intact.  Antibiotic ointment was applied followed by a bandage.  Keep dressing clean, dry, intact. -NWB -Ice/elevation -Pain medication as needed. -Monitor for any clinical signs or symptoms of infection and DVT/PE and directed to call the office immediately should any occur or go to the ER. -Follow-up [place] or sooner if any problems arise. In the meantime, encouraged to call the office with any questions, concerns, change in symptoms.   Celesta Gentile, DPM

## 2019-02-01 ENCOUNTER — Encounter: Payer: Self-pay | Admitting: Podiatry

## 2019-02-01 ENCOUNTER — Ambulatory Visit (INDEPENDENT_AMBULATORY_CARE_PROVIDER_SITE_OTHER): Payer: Self-pay | Admitting: Podiatry

## 2019-02-01 ENCOUNTER — Other Ambulatory Visit: Payer: Self-pay

## 2019-02-01 DIAGNOSIS — Z09 Encounter for follow-up examination after completed treatment for conditions other than malignant neoplasm: Secondary | ICD-10-CM

## 2019-02-01 DIAGNOSIS — M722 Plantar fascial fibromatosis: Secondary | ICD-10-CM

## 2019-02-01 NOTE — Progress Notes (Signed)
He presents today for a postop visit date of surgery January 05, 2019 status post plantar fibroma excision left states that is doing really good he denies fever chills nausea vomiting muscle aches and pains presents today with nonweightbearing status and crutches.  Objective: Vital signs are stable he is alert and oriented x3.  Pulses are palpable.  Staples are intact once removed demonstrates some mild dehiscence of the proximal incision site otherwise appears to be doing very well there is no erythema cellulitis drainage or odor no signs of infection.  Assessment: Well-healing surgical foot left.  Plan: Steri-Strip the foot today dressed a compressive dressing encouraged continue nonweightbearing status keep the foot dry and clean and elevated follow-up with him in 1 week to reevaluate.

## 2019-02-08 ENCOUNTER — Ambulatory Visit (INDEPENDENT_AMBULATORY_CARE_PROVIDER_SITE_OTHER): Payer: 59 | Admitting: Podiatry

## 2019-02-08 ENCOUNTER — Other Ambulatory Visit: Payer: Self-pay

## 2019-02-08 DIAGNOSIS — Z09 Encounter for follow-up examination after completed treatment for conditions other than malignant neoplasm: Secondary | ICD-10-CM

## 2019-02-08 DIAGNOSIS — M722 Plantar fascial fibromatosis: Secondary | ICD-10-CM

## 2019-02-08 NOTE — Progress Notes (Signed)
He presents today for follow-up of his excision plantar fibroma left foot.  He denies fever chills nausea vomiting muscle aches and pains.  We removed staples last visit but had a mild dehiscence that was Steri-Stripped and looks much better this week he states his been doing fine no pain.  Objective: Vital signs are stable he is alert oriented x3 presents with 1 crutch and his cam walker.  Once removed demonstrates Steri-Strips still in place once removed demonstrates no dehiscence of the wound.  Still some mild scabbing present.  Assessment: Well-healing surgical foot.  Plan: I encouraged him to start soaking Epson salts and warm water and allow him to shower during the day but soak at least once to twice a day.  Once the incision site appears to have been dried up that he can start putting some lotion on it.  I want him to continue his cam walker until he is able to walk freely with a cam walker is no pain that he may progress to a tennis shoe otherwise I will follow-up with him in a couple of weeks.

## 2019-02-15 ENCOUNTER — Other Ambulatory Visit: Payer: 59 | Admitting: Podiatry

## 2019-02-22 ENCOUNTER — Other Ambulatory Visit: Payer: Self-pay

## 2019-02-22 ENCOUNTER — Ambulatory Visit (INDEPENDENT_AMBULATORY_CARE_PROVIDER_SITE_OTHER): Payer: 59 | Admitting: Podiatry

## 2019-02-22 ENCOUNTER — Encounter: Payer: Self-pay | Admitting: Podiatry

## 2019-02-22 DIAGNOSIS — Z09 Encounter for follow-up examination after completed treatment for conditions other than malignant neoplasm: Secondary | ICD-10-CM

## 2019-02-22 DIAGNOSIS — M722 Plantar fascial fibromatosis: Secondary | ICD-10-CM

## 2019-02-22 NOTE — Progress Notes (Signed)
He presents today for a postop visit date of surgery 821 states that is doing good as he refers to the excision of large plantar fibroma to the plantar aspect of the left foot.  He denies fever chills nausea vomiting muscle aches and pains states that it still swells a little bit.  Otherwise he states that he is very happy with the outcome.  Objective: Minimal edema pulses are palpable surgical site is gone on to heal uneventfully.  Assessment: Well-healing surgical foot.  Plan: Follow-up with [place] on an as-needed basis.

## 2019-02-27 ENCOUNTER — Other Ambulatory Visit: Payer: Self-pay | Admitting: Internal Medicine

## 2019-02-27 ENCOUNTER — Other Ambulatory Visit (HOSPITAL_COMMUNITY): Payer: Self-pay | Admitting: Internal Medicine

## 2019-02-27 DIAGNOSIS — M79605 Pain in left leg: Secondary | ICD-10-CM

## 2019-02-28 ENCOUNTER — Ambulatory Visit (HOSPITAL_COMMUNITY)
Admission: RE | Admit: 2019-02-28 | Discharge: 2019-02-28 | Disposition: A | Discharge status: Home / Self Care | Payer: 59 | Source: Ambulatory Visit | Attending: Internal Medicine | Admitting: Internal Medicine

## 2019-02-28 ENCOUNTER — Other Ambulatory Visit: Payer: Self-pay

## 2019-02-28 DIAGNOSIS — M79605 Pain in left leg: Secondary | ICD-10-CM | POA: Diagnosis not present

## 2019-03-07 ENCOUNTER — Ambulatory Visit (INDEPENDENT_AMBULATORY_CARE_PROVIDER_SITE_OTHER): Payer: 59 | Admitting: Nurse Practitioner

## 2019-03-07 ENCOUNTER — Other Ambulatory Visit: Payer: Self-pay

## 2019-03-07 ENCOUNTER — Encounter: Payer: Self-pay | Admitting: Nurse Practitioner

## 2019-03-07 VITALS — BP 120/79 | HR 85 | Temp 97.3°F | Ht 70.0 in | Wt 191.2 lb

## 2019-03-07 DIAGNOSIS — R7989 Other specified abnormal findings of blood chemistry: Secondary | ICD-10-CM | POA: Diagnosis not present

## 2019-03-07 DIAGNOSIS — K838 Other specified diseases of biliary tract: Secondary | ICD-10-CM

## 2019-03-07 DIAGNOSIS — Z1211 Encounter for screening for malignant neoplasm of colon: Secondary | ICD-10-CM

## 2019-03-07 DIAGNOSIS — F1011 Alcohol abuse, in remission: Secondary | ICD-10-CM

## 2019-03-07 DIAGNOSIS — R945 Abnormal results of liver function studies: Secondary | ICD-10-CM

## 2019-03-07 DIAGNOSIS — Z0000 Encounter for general adult medical examination without abnormal findings: Secondary | ICD-10-CM

## 2019-03-07 MED ORDER — SUPREP BOWEL PREP KIT 17.5-3.13-1.6 GM/177ML PO SOLN: 1.0000 | ORAL | 0 refills | Status: DC

## 2019-03-07 NOTE — Patient Instructions (Signed)
Your health issues we discussed today were:   Elevated liver enzymes and abnormal liver ultrasound previously: 1. Has been recommended previously, you need to have your labs drawn 2. We will help schedule another appointment to have your ultrasound completed 3. Continue to abstain from alcohol 4. We will call you with results 5. Further recommendations to follow 6. Call us if you have any worsening of significant symptoms.  Need for colonoscopy: 1. We will schedule your colonoscopy for you 2. Further recommendations will follow your colonoscopy  Overall I recommend:  1. Continue your current medications 2. Call us if you have any questions or concerns 3. Follow-up in 4 months.   Because of recent events of COVID-19 ("Coronavirus"), follow CDC recommendations:  1. Wash your hand frequently 2. Avoid touching your face 3. Stay away from people who are sick 4. If you have symptoms such as fever, cough, shortness of breath then call your healthcare provider for further guidance 5. If you are sick, STAY AT HOME unless otherwise directed by your healthcare provider. 6. Follow directions from state and national officials regarding staying safe   At [facility] we value your feedback. You may receive a survey about your visit today. Please share your experience as we strive to create trusting relationships with our patients to provide genuine, compassionate, quality care.  We appreciate your understanding and patience as we review any laboratory studies, imaging, and other diagnostic tests that are ordered as we care for you. Our office policy is 5 business days for review of these results, and any emergent or urgent results are addressed in a timely manner for your best interest. If you do not hear from our office in 1 week, please contact us.   We also encourage the use of MyChart, which contains your medical information for your review as well. If you are not enrolled in  this feature, an access code is on this after visit summary for your convenience. Thank you for allowing [place] to be involved in your care.  It was great to see you today!  I hope you have a great Fall!!

## 2019-03-07 NOTE — Assessment & Plan Note (Signed)
Previously elevated LFTs. His last 2 visits we have recommended he have labs updated and has not done this.  He states he will do this today or tomorrow.  Strongly encouraged him to update his labs.  Follow-up in 4 months.

## 2019-03-07 NOTE — Progress Notes (Signed)
hepat

## 2019-03-07 NOTE — Assessment & Plan Note (Signed)
Previous CBD dilation on ultrasound as per HPI.  His last 2 office visits we have recommended he have an updated ultrasound which she has not completed yet.  We will have our office work on getting his ultrasound rescheduled follow-up in 4 months.

## 2019-03-07 NOTE — Assessment & Plan Note (Signed)
Patient is not sure how many colonoscopies he had.  He states they were all at [facility].  The only report available with a standard 2200.  He felt current time after that pain is not sure.  Regardless, at this point he is currently due for screening colonoscopy given his age we will proceed with scheduling at this time.  Proceed with TCS on propofol/MAC with Dr. Gala Romney in near future: the risks, benefits, and alternatives have been discussed with the patient in detail. The patient states understanding and desires to proceed.  The patient is currently on Adderall and methadone.  History of significant alcohol but none currently drinking. No other anticoagulants, anxiolytics, chronic medications, antidepressants, antidiabetic medicines, or iron supplements.  We will plan for the procedure on propofol/MAC to promote adequate sedation.

## 2019-03-07 NOTE — Progress Notes (Signed)
Referring Provider: Elfredia Nevins, MD Primary Care Physician:  Elfredia Nevins, MD Primary GI:  Dr. Jena Gauss  Chief Complaint  Patient presents with  . Colonoscopy    fu elevated lft's    HPI:   Howard [name] is a 53 y.o. male who presents on referral from primary care for colonoscopy.  The patient was last seen in our office 09/05/2018 for CBD dilation and elevated LFTs.  Office visit at that time was a virtual office visit due to COVID-19/coronavirus pandemic.  History of alcohol abuse noted.  Asymptomatic from a GI standpoint previously.  AST/ALT at 69/106, elevated alkaline phosphatase 160 but normal bilirubin 0.4.  Abdominal ultrasound 02/16/2018 found CBD diameter of 11 mm which is slightly enlarged compared to prior exam.  At his previous visit on 07/03/2018 he noted primary care blood work completed the week prior.  At his previous office visit with primary care he has been drinking a lot and since quitting alcohol has lost 20 to 25 pounds over 4 to 5 months.  Last colonoscopy "3 to 4 years ago" at [facility], although procedure report could not be found. The noted procedure date was 08/18/2000. Recommended repeat LFTs, ultrasound, complete abstinence of alcohol, follow-up in 2 months.  Unfortunately labs and imaging were not completed  At his most recent office visit he was doing well, still not drinking.  No other GI complaints.  Recommended completing labs and ultrasound.  Unfortunately, elective radiology was on hold due to the pandemic.  Recommended follow-up in 6 months.  Again his labs that we ordered including hepatic function panel and GGT were not completed.  No recent evaluation of his liver enzymes are on file.  Additionally a letter was mailed to the patient with his ultrasound appointment details.  However, this too was not completed.  Reviewed primary care office note dated 09/04/2018 which was a follow-up visit.  At that time degenerated referral to GI for screening  colonoscopy.  Eventually a colonoscopy report was found dated 08/18/2000 by Dr. Lovell Sheehan with surgery.  This was completed due to sigmoid diverticulosis.  Findings included a small 3 mm polyp in the sigmoid colon which was removed.  Surgical pathology found the polyp to be hyperplastic.  Recommended 3-year repeat exam.  No additional colonoscopy reports could be found to document repeat exam as recommended.  Today he states he's doing ok overall. Keeps forgetting to have labs and imaging. Is going to go today or tomorrow. Has had 2 surgeries since his last visit: right shoulder replacement and left foot neurofibroma excision. Denies abdominal pain, N/V, hematochezia, melena, fever, chills, unintentional weight loss. Denies yellowing of skin/eyes, darkened urine, acute episodic confusion, tremors/shakes, generalized pruritis. Thinks he's had a second colonoscopy but isn't sure. Denies URI or flu-like symptoms. Denies loss of sense of taste or smell. Denies chest pain, dyspnea, dizziness, lightheadedness, syncope, near syncope. Denies any other upper or lower GI symptoms.  Past Medical History:  Diagnosis Date  . Alcohol abuse   . Anxiety   . Chronic back pain   . Chronic pain   . Depression   . Fall   . Fracture of right elbow   . Osteoarthritis   . Psoriasis     Past Surgical History:  Procedure Laterality Date  . APPENDECTOMY    . FOOT SURGERY     per patient: "neurofibroma excision"  . LUMBAR LAMINECTOMY/DECOMPRESSION MICRODISCECTOMY Bilateral 12/31/2015   Procedure: LUMBER DECOMPRESSION L4-5/ L5-S1 BILATERALLY  ;  Surgeon: Tinnie Gens  Tonita Cong, MD;  Location: [facility];  Service: Orthopedics;  Laterality: Bilateral;  . REVERSE SHOULDER ARTHROPLASTY Right 10/24/2018  . REVERSE SHOULDER ARTHROPLASTY Right 10/24/2018   Procedure: REVERSE SHOULDER ARTHROPLASTY;  Surgeon: Nicholes Stairs, MD;  Location: [place];  Service: Orthopedics;  Laterality: Right;  . SHOULDER SURGERY Right   . TOTAL HIP  ARTHROPLASTY    . TOTAL HIP ARTHROPLASTY Left 08/27/2014   Procedure: LEFT TOTAL HIP ARTHROPLASTY ANTERIOR APPROACH;  Surgeon: Paralee Cancel, MD;  Location: [facility];  Service: Orthopedics;  Laterality: Left;    Current Outpatient Medications  Medication Sig Dispense Refill  . amphetamine-dextroamphetamine (ADDERALL) 20 MG tablet Take 20 mg by mouth 2 (two) times daily with a meal.     . esomeprazole (NEXIUM) 40 MG capsule Take 40 mg by mouth See admin instructions. Every 2-3 days    . ibuprofen (ADVIL,MOTRIN) 800 MG tablet Take 800 mg by mouth 2 (two) times daily.     Marland Kitchen levothyroxine (SYNTHROID, LEVOTHROID) 25 MCG tablet Take 25 mcg by mouth daily before breakfast.     . methadone (DOLOPHINE) 10 MG/ML solution Take 40 mg by mouth daily.     . Secukinumab (COSENTYX) 150 MG/ML SOSY Inject into the skin. Takes 1 a week for 4 weeks and then once a month.    . sildenafil (VIAGRA) 100 MG tablet as needed.      Current Facility-Administered Medications  Medication Dose Route Frequency Provider Last Rate Last Dose  . vancomycin (VANCOCIN) 1,000 mg in sodium chloride 0.9 % 500 mL IVPB  1,000 mg Intravenous Once Avelina Laine, PA-C        Allergies as of 03/07/2019  . (No Known Allergies)    Family History  Problem Relation Age of Onset  . Colon cancer Neg Hx     Social History   Socioeconomic History  . Marital status: Married    Spouse name: Not on file  . Number of children: Not on file  . Years of education: Not on file  . Highest education level: Not on file  Occupational History  . Not on file  Social Needs  . Financial resource strain: Not on file  . Food insecurity    Worry: Not on file    Inability: Not on file  . Transportation needs    Medical: Not on file    Non-medical: Not on file  Tobacco Use  . Smoking status: Current Every Day Smoker    Packs/day: 1.00    Years: 32.00    Pack years: 32.00    Types: Cigarettes  . Smokeless tobacco: Never Used  Substance and  Sexual Activity  . Alcohol use: Not Currently    Alcohol/week: 16.0 standard drinks    Types: 6 Glasses of wine, 10 Cans of beer per week    Comment: None currently (last ETOH 03/01/2018); previously drinks 3 to 4 beers daily and 1 glass of wine; "way more" on the weekend  . Drug use: No    Comment: hx of marijuana use years ago   . Sexual activity: Not on file  Lifestyle  . Physical activity    Days per week: Not on file    Minutes per session: Not on file  . Stress: Not on file  Relationships  . Social Herbalist on phone: Not on file    Gets together: Not on file    Attends religious service: Not on file    Active member of club or organization:  Not on file    Attends meetings of clubs or organizations: Not on file    Relationship status: Not on file  Other Topics Concern  . Not on file  Social History Narrative  . Not on file    Review of Systems: General: Negative for anorexia, weight loss, fever, chills, fatigue, weakness. ENT: Negative for hoarseness, difficulty swallowing. CV: Negative for chest pain, angina, palpitations, peripheral edema.  Respiratory: Negative for dyspnea at rest, cough, sputum, wheezing.  GI: See history of present illness. MS: some continued foot pain after surgery.  Derm: Negative for rash or itching.  Neuro: Negative for memory loss, confusion.  Endo: Negative for unusual weight change.  Heme: Negative for bruising or bleeding. Allergy: Negative for rash or hives.   Physical Exam: BP 120/79   Pulse 85   Temp (!) 97.3 F (36.3 C)   Ht 5\' 10"  (1.778 m)   Wt 191 lb 3.2 oz (86.7 kg)   BMI 27.43 kg/m  General:   Alert and oriented. Pleasant and cooperative. Well-nourished and well-developed.  Eyes:  Without icterus, sclera clear and conjunctiva pink.  Ears:  Normal auditory acuity. Cardiovascular:  S1, S2 present without murmurs appreciated. Extremities without clubbing or edema. Respiratory:  Clear to auscultation  bilaterally. No wheezes, rales, or rhonchi. No distress.  Gastrointestinal:  +BS, soft, non-tender and non-distended. No HSM noted. No guarding or rebound. No masses appreciated.  Rectal:  Deferred  Musculoskalatal:  Symmetrical without gross deformities. Neurologic:  Alert and oriented x4;  grossly normal neurologically. Psych:  Alert and cooperative. Normal mood and affect. Heme/Lymph/Immune: No excessive bruising noted.    03/07/2019 9:49 AM   Disclaimer: This note was dictated with voice recognition software. Similar sounding words can inadvertently be transcribed and may not be corrected upon review.

## 2019-03-08 ENCOUNTER — Telehealth: Payer: Self-pay | Admitting: *Deleted

## 2019-03-08 NOTE — Addendum Note (Signed)
Addended by: Cheron Every on: 03/08/2019 01:26 PM   Modules accepted: Orders

## 2019-03-08 NOTE — Telephone Encounter (Signed)
PA approved via [facility] website. Auth#: [id number] Dates 06/07/2019-09/05/2019

## 2019-03-12 ENCOUNTER — Ambulatory Visit (HOSPITAL_COMMUNITY)
Admission: RE | Admit: 2019-03-12 | Discharge: 2019-03-12 | Disposition: A | Discharge status: Home / Self Care | Payer: 59 | Source: Ambulatory Visit | Attending: Nurse Practitioner | Admitting: Nurse Practitioner

## 2019-03-12 ENCOUNTER — Other Ambulatory Visit: Payer: Self-pay

## 2019-03-12 DIAGNOSIS — K838 Other specified diseases of biliary tract: Secondary | ICD-10-CM | POA: Diagnosis present

## 2019-03-12 DIAGNOSIS — F1011 Alcohol abuse, in remission: Secondary | ICD-10-CM | POA: Diagnosis present

## 2019-03-12 DIAGNOSIS — R7989 Other specified abnormal findings of blood chemistry: Secondary | ICD-10-CM

## 2019-03-13 LAB — HEPATIC FUNCTION PANEL
AG Ratio: 2.5 (calc) (ref 1.0–2.5)
ALT: 37 U/L (ref 9–46)
AST: 32 U/L (ref 10–35)
Albumin: 4 g/dL (ref 3.6–5.1)
Alkaline phosphatase (APISO): 107 U/L (ref 35–144)
Bilirubin, Direct: 0.1 mg/dL (ref 0.0–0.2)
Globulin: 1.6 g/dL (calc) — ABNORMAL LOW (ref 1.9–3.7)
Indirect Bilirubin: 0.5 mg/dL (calc) (ref 0.2–1.2)
Total Bilirubin: 0.6 mg/dL (ref 0.2–1.2)
Total Protein: 5.6 g/dL — ABNORMAL LOW (ref 6.1–8.1)

## 2019-03-13 LAB — GAMMA GT: GGT: 29 U/L (ref 3–95)

## 2019-03-22 ENCOUNTER — Encounter: Payer: 59 | Admitting: Podiatry

## 2019-03-27 ENCOUNTER — Encounter: Payer: 59 | Admitting: Podiatry

## 2019-03-29 ENCOUNTER — Encounter: Payer: 59 | Admitting: Podiatry

## 2019-04-05 ENCOUNTER — Other Ambulatory Visit (HOSPITAL_COMMUNITY): Payer: Self-pay | Admitting: Internal Medicine

## 2019-04-05 ENCOUNTER — Ambulatory Visit (HOSPITAL_COMMUNITY)
Admission: RE | Admit: 2019-04-05 | Discharge: 2019-04-05 | Disposition: A | Discharge status: Home / Self Care | Payer: 59 | Source: Ambulatory Visit | Attending: Internal Medicine | Admitting: Internal Medicine

## 2019-04-05 ENCOUNTER — Ambulatory Visit (INDEPENDENT_AMBULATORY_CARE_PROVIDER_SITE_OTHER): Payer: 59 | Admitting: Podiatry

## 2019-04-05 ENCOUNTER — Encounter: Payer: Self-pay | Admitting: Podiatry

## 2019-04-05 ENCOUNTER — Encounter: Payer: 59 | Admitting: Podiatry

## 2019-04-05 ENCOUNTER — Other Ambulatory Visit: Payer: Self-pay

## 2019-04-05 DIAGNOSIS — M722 Plantar fascial fibromatosis: Secondary | ICD-10-CM

## 2019-04-05 DIAGNOSIS — R0781 Pleurodynia: Secondary | ICD-10-CM

## 2019-04-05 DIAGNOSIS — Z09 Encounter for follow-up examination after completed treatment for conditions other than malignant neoplasm: Secondary | ICD-10-CM

## 2019-04-07 NOTE — Progress Notes (Signed)
He presents today date of surgery 01/05/2019 status post plantar fibroma left.  He states overall is good the toes are just numb but we know that is going to be that way because of the way they are shaped.  Objective: Vital signs are stable he is alert and oriented x3.  Plantar fibroma incision is gone on to heal very nicely there is no signs of plantar fibroma remaining.  Assessment: Well-healing surgical foot left with severe hammertoe deformities left.  Plan: Follow-up with him when he is ready for hammertoe repair.

## 2019-06-04 NOTE — Patient Instructions (Signed)
Howard [name]  06/04/2019     @PREFPERIOPPHARMACY @   Your procedure is scheduled on  06/07/2019.  Report to 06/09/2019 at  0815  A.M.  Call this number if you have problems the morning of surgery:  [phone]   Remember:  Follow the diet and prep instructions given to you by Dr [phone] office.                  Take these medicines the morning of surgery with A SIP OF WATER  Adderall, buspar, nexium, levothyroxne, methadone.    Do not wear jewelry, make-up or nail polish.  Do not wear lotions, powders, or perfumes. Please wear deodorant and brush your teeth.  Do not shave 48 hours prior to surgery.  Men may shave face and neck.  Do not bring valuables to the hospital.  [facility] is not responsible for any belongings or valuables.  Contacts, dentures or bridgework may not be worn into surgery.  Leave your suitcase in the car.  After surgery it may be brought to your room.  For patients admitted to the hospital, discharge time will be determined by your treatment team.  Patients discharged the day of surgery will not be allowed to drive home.   Name and phone number of your driver:   family Special instructions:  None  Please read over the following fact sheets that you were given. Anesthesia Post-op Instructions and Care and Recovery After Surgery       Colonoscopy, Adult, Care After This sheet gives you information about how to care for yourself after your procedure. Your health care provider may also give you more specific instructions. If you have problems or questions, contact your health care provider. What can I expect after the procedure? After the procedure, it is common to have:  A small amount of blood in your stool for 24 hours after the procedure.  Some gas.  Mild cramping or bloating of your abdomen. Follow these instructions at home: Eating and drinking   Drink enough fluid to keep your urine pale yellow.  Follow instructions from your  health care provider about eating or drinking restrictions.  Resume your normal diet as instructed by your health care provider. Avoid heavy or fried foods that are hard to digest. Activity  Rest as told by your health care provider.  Avoid sitting for a long time without moving. Get up to take short walks every 1-2 hours. This is important to improve blood flow and breathing. Ask for help if you feel weak or unsteady.  Return to your normal activities as told by your health care provider. Ask your health care provider what activities are safe for you. Managing cramping and bloating   Try walking around when you have cramps or feel bloated.  Apply heat to your abdomen as told by your health care provider. Use the heat source that your health care provider recommends, such as a moist heat pack or a heating pad. ? Place a towel between your skin and the heat source. ? Leave the heat on for 20-30 minutes. ? Remove the heat if your skin turns bright red. This is especially important if you are unable to feel pain, heat, or cold. You may have a greater risk of getting burned. General instructions  For the first 24 hours after the procedure: ? Do not drive or use machinery. ? Do not sign important documents. ? Do not drink alcohol. ?  Do your regular daily activities at a slower pace than normal. ? Eat soft foods that are easy to digest.  Take over-the-counter and prescription medicines only as told by your health care provider.  Keep all follow-up visits as told by your health care provider. This is important. Contact a health care provider if:  You have blood in your stool 2-3 days after the procedure. Get help right away if you have:  More than a small spotting of blood in your stool.  Large blood clots in your stool.  Swelling of your abdomen.  Nausea or vomiting.  A fever.  Increasing pain in your abdomen that is not relieved with medicine. Summary  After the procedure,  it is common to have a small amount of blood in your stool. You may also have mild cramping and bloating of your abdomen.  For the first 24 hours after the procedure, do not drive or use machinery, sign important documents, or drink alcohol.  Get help right away if you have a lot of blood in your stool, nausea or vomiting, a fever, or increased pain in your abdomen. This information is not intended to replace advice given to you by your health care provider. Make sure you discuss any questions you have with your health care provider. Document Revised: 11/27/2018 Document Reviewed: 11/27/2018 Elsevier Patient Education  [place] After These instructions provide you with information about caring for yourself after your procedure. Your health care provider may also give you more specific instructions. Your treatment has been planned according to current medical practices, but problems sometimes occur. Call your health care provider if you have any problems or questions after your procedure. What can I expect after the procedure? After your procedure, you may:  Feel sleepy for several hours.  Feel clumsy and have poor balance for several hours.  Feel forgetful about what happened after the procedure.  Have poor judgment for several hours.  Feel nauseous or vomit.  Have a sore throat if you had a breathing tube during the procedure. Follow these instructions at home: For at least 24 hours after the procedure:      Have a responsible adult stay with you. It is important to have someone help care for you until you are awake and alert.  Rest as needed.  Do not: ? Participate in activities in which you could fall or become injured. ? Drive. ? Use heavy machinery. ? Drink alcohol. ? Take sleeping pills or medicines that cause drowsiness. ? Make important decisions or sign legal documents. ? Take care of children on your own. Eating and  drinking  Follow the diet that is recommended by your health care provider.  If you vomit, drink water, juice, or soup when you can drink without vomiting.  Make sure you have little or no nausea before eating solid foods. General instructions  Take over-the-counter and prescription medicines only as told by your health care provider.  If you have sleep apnea, surgery and certain medicines can increase your risk for breathing problems. Follow instructions from your health care provider about wearing your sleep device: ? Anytime you are sleeping, including during daytime naps. ? While taking prescription pain medicines, sleeping medicines, or medicines that make you drowsy.  If you smoke, do not smoke without supervision.  Keep all follow-up visits as told by your health care provider. This is important. Contact a health care provider if:  You keep feeling nauseous or you keep  vomiting.  You feel light-headed.  You develop a rash.  You have a fever. Get help right away if:  You have trouble breathing. Summary  For several hours after your procedure, you may feel sleepy and have poor judgment.  Have a responsible adult stay with you for at least 24 hours or until you are awake and alert. This information is not intended to replace advice given to you by your health care provider. Make sure you discuss any questions you have with your health care provider. Document Revised: 08/01/2017 Document Reviewed: 08/24/2015 Elsevier Patient Education  [place].

## 2019-06-05 ENCOUNTER — Encounter (HOSPITAL_COMMUNITY): Payer: Self-pay

## 2019-06-05 ENCOUNTER — Other Ambulatory Visit: Payer: Self-pay

## 2019-06-05 ENCOUNTER — Encounter (HOSPITAL_COMMUNITY)
Admission: RE | Admit: 2019-06-05 | Discharge: 2019-06-05 | Disposition: A | Discharge status: Home / Self Care | Payer: 59 | Source: Ambulatory Visit | Attending: Internal Medicine | Admitting: Internal Medicine

## 2019-06-05 ENCOUNTER — Other Ambulatory Visit (HOSPITAL_COMMUNITY)
Admission: RE | Admit: 2019-06-05 | Discharge: 2019-06-05 | Disposition: A | Discharge status: Home / Self Care | Payer: 59 | Source: Ambulatory Visit | Attending: Internal Medicine | Admitting: Internal Medicine

## 2019-06-05 DIAGNOSIS — K649 Unspecified hemorrhoids: Secondary | ICD-10-CM | POA: Diagnosis not present

## 2019-06-05 DIAGNOSIS — Z01812 Encounter for preprocedural laboratory examination: Secondary | ICD-10-CM | POA: Diagnosis not present

## 2019-06-05 DIAGNOSIS — K5792 Diverticulitis of intestine, part unspecified, without perforation or abscess without bleeding: Secondary | ICD-10-CM | POA: Diagnosis not present

## 2019-06-05 LAB — COMPREHENSIVE METABOLIC PANEL
ALT: 40 U/L (ref 0–44)
AST: 28 U/L (ref 15–41)
Albumin: 4.1 g/dL (ref 3.5–5.0)
Alkaline Phosphatase: 112 U/L (ref 38–126)
Anion gap: 8 (ref 5–15)
BUN: 17 mg/dL (ref 6–20)
CO2: 28 mmol/L (ref 22–32)
Calcium: 8.8 mg/dL — ABNORMAL LOW (ref 8.9–10.3)
Chloride: 99 mmol/L (ref 98–111)
Creatinine, Ser: 0.5 mg/dL — ABNORMAL LOW (ref 0.61–1.24)
GFR calc Af Amer: >60 mL/min (ref >60)
GFR calc non Af Amer: >60 mL/min (ref >60)
Glucose, Bld: 195 mg/dL — ABNORMAL HIGH (ref 70–99)
Potassium: 4 mmol/L (ref 3.5–5.1)
Sodium: 135 mmol/L (ref 135–145)
Total Bilirubin: 0.9 mg/dL (ref 0.3–1.2)
Total Protein: 6.3 g/dL — ABNORMAL LOW (ref 6.5–8.1)

## 2019-06-05 LAB — SARS CORONAVIRUS 2 (TAT 6-24 HRS): SARS Coronavirus 2: NEGATIVE

## 2019-06-07 ENCOUNTER — Other Ambulatory Visit: Payer: Self-pay

## 2019-06-07 ENCOUNTER — Encounter (HOSPITAL_COMMUNITY)
Admission: RE | Disposition: A | Discharge status: Home / Self Care | Payer: Self-pay | Source: Home / Self Care | Attending: Internal Medicine

## 2019-06-07 ENCOUNTER — Ambulatory Visit (HOSPITAL_COMMUNITY): Payer: 59 | Admitting: Anesthesiology

## 2019-06-07 ENCOUNTER — Ambulatory Visit (HOSPITAL_COMMUNITY)
Admission: RE | Admit: 2019-06-07 | Discharge: 2019-06-07 | Disposition: A | Discharge status: Home / Self Care | Payer: 59 | Attending: Internal Medicine | Admitting: Internal Medicine

## 2019-06-07 DIAGNOSIS — Z79890 Hormone replacement therapy (postmenopausal): Secondary | ICD-10-CM | POA: Diagnosis not present

## 2019-06-07 DIAGNOSIS — L409 Psoriasis, unspecified: Secondary | ICD-10-CM | POA: Diagnosis not present

## 2019-06-07 DIAGNOSIS — Z79899 Other long term (current) drug therapy: Secondary | ICD-10-CM | POA: Insufficient documentation

## 2019-06-07 DIAGNOSIS — K5730 Diverticulosis of large intestine without perforation or abscess without bleeding: Secondary | ICD-10-CM | POA: Diagnosis not present

## 2019-06-07 DIAGNOSIS — M1990 Unspecified osteoarthritis, unspecified site: Secondary | ICD-10-CM | POA: Insufficient documentation

## 2019-06-07 DIAGNOSIS — F419 Anxiety disorder, unspecified: Secondary | ICD-10-CM | POA: Diagnosis not present

## 2019-06-07 DIAGNOSIS — Z79891 Long term (current) use of opiate analgesic: Secondary | ICD-10-CM | POA: Diagnosis not present

## 2019-06-07 DIAGNOSIS — F17210 Nicotine dependence, cigarettes, uncomplicated: Secondary | ICD-10-CM | POA: Diagnosis not present

## 2019-06-07 DIAGNOSIS — K640 First degree hemorrhoids: Secondary | ICD-10-CM | POA: Diagnosis not present

## 2019-06-07 DIAGNOSIS — E039 Hypothyroidism, unspecified: Secondary | ICD-10-CM | POA: Insufficient documentation

## 2019-06-07 DIAGNOSIS — Z1211 Encounter for screening for malignant neoplasm of colon: Secondary | ICD-10-CM | POA: Insufficient documentation

## 2019-06-07 DIAGNOSIS — F988 Other specified behavioral and emotional disorders with onset usually occurring in childhood and adolescence: Secondary | ICD-10-CM | POA: Insufficient documentation

## 2019-06-07 DIAGNOSIS — K219 Gastro-esophageal reflux disease without esophagitis: Secondary | ICD-10-CM | POA: Diagnosis not present

## 2019-06-07 HISTORY — PX: COLONOSCOPY WITH PROPOFOL: SHX5780

## 2019-06-07 SURGERY — COLONOSCOPY WITH PROPOFOL
Anesthesia: General

## 2019-06-07 MED ORDER — CHLORHEXIDINE GLUCONATE CLOTH 2 % EX PADS: 6.0000 | MEDICATED_PAD | Freq: Once | CUTANEOUS | Status: DC

## 2019-06-07 MED ORDER — PROPOFOL 10 MG/ML IV BOLUS
INTRAVENOUS | Status: DC | PRN
  Administered 2019-06-07: 20 mg via INTRAVENOUS
  Administered 2019-06-07: 40 mg via INTRAVENOUS
  Administered 2019-06-07: 20 mg via INTRAVENOUS

## 2019-06-07 MED ORDER — KETAMINE HCL 50 MG/5ML IJ SOSY
PREFILLED_SYRINGE | INTRAMUSCULAR | Status: AC
  Filled 2019-06-07: qty 5

## 2019-06-07 MED ORDER — LACTATED RINGERS IV SOLN: INTRAVENOUS | Status: DC | PRN

## 2019-06-07 MED ORDER — PROPOFOL 500 MG/50ML IV EMUL
INTRAVENOUS | Status: DC | PRN
  Administered 2019-06-07: 200 ug/kg/min via INTRAVENOUS
  Administered 2019-06-07: 125 ug/kg/min via INTRAVENOUS

## 2019-06-07 MED ORDER — LACTATED RINGERS IV SOLN
Freq: Once | INTRAVENOUS | Status: AC
  Administered 2019-06-07: 08:00:00 1000 mL via INTRAVENOUS

## 2019-06-07 MED ORDER — KETOROLAC TROMETHAMINE 30 MG/ML IJ SOLN
INTRAMUSCULAR | Status: DC | PRN
  Administered 2019-06-07: 20 mg via INTRAVENOUS

## 2019-06-07 NOTE — Anesthesia Preprocedure Evaluation (Signed)
Anesthesia Evaluation  Patient identified by MRN, date of birth, ID band Patient awake    Reviewed: Allergy & Precautions, NPO status , Patient's Chart, lab work & pertinent test results  Airway Mallampati: II  TM Distance: >3 FB Neck ROM: Full    Dental  (+) Caps, Dental Advisory Given Upper front bridge :   Pulmonary Current SmokerPatient did not abstain from smoking.,    Pulmonary exam normal breath sounds clear to auscultation       Cardiovascular Exercise Tolerance: Good Normal cardiovascular exam Rhythm:Regular Rate:Normal     Neuro/Psych PSYCHIATRIC DISORDERS Anxiety Depression    GI/Hepatic Neg liver ROS, GERD  Medicated and Controlled,  Endo/Other  Hypothyroidism   Renal/GU negative Renal ROS     Musculoskeletal  (+) Arthritis , Osteoarthritis,  Chronic pain on methadone    Abdominal   Peds  Hematology negative hematology ROS (+)   Anesthesia Other Findings ADD  Reproductive/Obstetrics negative OB ROS                             Anesthesia Physical Anesthesia Plan  ASA: II  Anesthesia Plan: General   Post-op Pain Management:    Induction: Intravenous  PONV Risk Score and Plan: TIVA  Airway Management Planned: Nasal Cannula, Natural Airway and Simple Face Mask  Additional Equipment:   Intra-op Plan:   Post-operative Plan:   Informed Consent: I have reviewed the patients History and Physical, chart, labs and discussed the procedure including the risks, benefits and alternatives for the proposed anesthesia with the patient or authorized representative who has indicated his/her understanding and acceptance.     Dental advisory given  Plan Discussed with: CRNA  Anesthesia Plan Comments:         Anesthesia Quick Evaluation

## 2019-06-07 NOTE — H&P (Signed)
'@LOGO' @   Primary Care Physician:  Redmond School, MD Primary Gastroenterologist:  Dr. Gala Romney  Pre-Procedure History & Physical: HPI:  Howard [name] is a 54 y.o. male here for a screening colonoscopy.  No bowel symptoms.  Colonoscopy 2002 related to diverticulitis hyperplastic polyp and diverticula found.  Here for average her screening colonoscopy.  Past Medical History:  Diagnosis Date  . Alcohol abuse   . Anxiety   . Chronic back pain   . Chronic pain   . Depression   . Fall   . Fracture of right elbow   . Osteoarthritis   . Psoriasis     Past Surgical History:  Procedure Laterality Date  . APPENDECTOMY    . FOOT SURGERY     per patient: "neurofibroma excision"  . LUMBAR LAMINECTOMY/DECOMPRESSION MICRODISCECTOMY Bilateral 12/31/2015   Procedure: LUMBER DECOMPRESSION L4-5/ L5-S1 BILATERALLY  ;  Surgeon: Susa Day, MD;  Location: [facility];  Service: Orthopedics;  Laterality: Bilateral;  . REVERSE SHOULDER ARTHROPLASTY Right 10/24/2018  . REVERSE SHOULDER ARTHROPLASTY Right 10/24/2018   Procedure: REVERSE SHOULDER ARTHROPLASTY;  Surgeon: Nicholes Stairs, MD;  Location: [place];  Service: Orthopedics;  Laterality: Right;  . SHOULDER SURGERY Right   . TOTAL HIP ARTHROPLASTY    . TOTAL HIP ARTHROPLASTY Left 08/27/2014   Procedure: LEFT TOTAL HIP ARTHROPLASTY ANTERIOR APPROACH;  Surgeon: Paralee Cancel, MD;  Location: [facility];  Service: Orthopedics;  Laterality: Left;    Prior to Admission medications   Medication Sig Start Date End Date Taking? Authorizing Provider  amphetamine-dextroamphetamine (ADDERALL) 20 MG tablet Take 20 mg by mouth 2 (two) times daily with a meal.  07/14/18  Yes [provider]  busPIRone (BUSPAR) 30 MG tablet Take 30 mg by mouth 2 (two) times daily. 03/21/19  Yes [provider]  COSENTYX SENSOREADY PEN 150 MG/ML SOAJ Inject 150 mg into the skin every 14 (fourteen) days.  05/01/19  Yes [provider]  esomeprazole (NEXIUM)  40 MG capsule Take 40 mg by mouth daily as needed (acid reflux/indigestion.).  06/26/18  Yes [provider]  ibuprofen (ADVIL,MOTRIN) 800 MG tablet Take 800 mg by mouth every 8 (eight) hours as needed (pain).    Yes [provider]  levothyroxine (SYNTHROID, LEVOTHROID) 25 MCG tablet Take 25 mcg by mouth daily before breakfast.  06/26/18  Yes [provider]  methadone (DOLOPHINE) 10 MG/ML solution Take 30 mg by mouth daily.    Yes [provider]  Na Sulfate-K Sulfate-Mg Sulf (SUPREP BOWEL PREP KIT) 17.5-3.13-1.6 GM/177ML SOLN Take 1 kit by mouth as directed. 03/07/19  Yes Daneil Dolin, MD    Allergies as of 03/07/2019  . (No Known Allergies)    Family History  Problem Relation Age of Onset  . Colon cancer Neg Hx     Social History   Socioeconomic History  . Marital status: Married    Spouse name: Not on file  . Number of children: Not on file  . Years of education: Not on file  . Highest education level: Not on file  Occupational History  . Not on file  Tobacco Use  . Smoking status: Current Every Day Smoker    Packs/day: 1.00    Years: 32.00    Pack years: 32.00    Types: Cigarettes  . Smokeless tobacco: Never Used  Substance and Sexual Activity  . Alcohol use: Not Currently    Alcohol/week: 16.0 standard drinks    Types: 6 Glasses of wine, 10 Cans  of beer per week    Comment: None currently (last ETOH 03/01/2018); previously drinks 3 to 4 beers daily and 1 glass of wine; "way more" on the weekend  . Drug use: No    Comment: hx of marijuana use years ago   . Sexual activity: Not on file  Other Topics Concern  . Not on file  Social History Narrative  . Not on file   Social Determinants of Health   Financial Resource Strain:   . Difficulty of Paying Living Expenses: Not on file  Food Insecurity:   . Worried About Charity fundraiser in the Last Year: Not on file  . Ran Out of Food in the Last Year: Not on file  Transportation  Needs:   . Lack of Transportation (Medical): Not on file  . Lack of Transportation (Non-Medical): Not on file  Physical Activity:   . Days of Exercise per Week: Not on file  . Minutes of Exercise per Session: Not on file  Stress:   . Feeling of Stress : Not on file  Social Connections:   . Frequency of Communication with Friends and Family: Not on file  . Frequency of Social Gatherings with Friends and Family: Not on file  . Attends Religious Services: Not on file  . Active Member of Clubs or Organizations: Not on file  . Attends Archivist Meetings: Not on file  . Marital Status: Not on file  Intimate Partner Violence:   . Fear of Current or Ex-Partner: Not on file  . Emotionally Abused: Not on file  . Physically Abused: Not on file  . Sexually Abused: Not on file    Review of Systems: See HPI, otherwise negative ROS  Physical Exam: BP 117/81   Pulse 74   Temp 98.2 F (36.8 C) (Oral)   Resp 18   Ht '5\' 11"'  (1.803 m)   Wt 83.9 kg   SpO2 95%   BMI 25.80 kg/m  General:   Alert,  Well-developed, well-nourished, pleasant and cooperative in NAD Neck:  Supple; no masses or thyromegaly. No significant cervical adenopathy. Lungs:  Clear throughout to auscultation.   No wheezes, crackles, or rhonchi. No acute distress. Heart:  Regular rate and rhythm; no murmurs, clicks, rubs,  or gallops. Abdomen: Non-distended, normal bowel sounds.  Soft and nontender without appreciable mass or hepatosplenomegaly.  Pulses:  Normal pulses noted. Extremities:  Without clubbing or edema.  Impression: 54 year old man here for average rescreening colonoscopy.  His first colonoscopy for screening purposes. .The risks, benefits, limitations, alternatives and imponderables have been reviewed with the patient. Questions have been answered. All parties are agreeable.      Notice: This dictation was prepared with Dragon dictation along with smaller phrase technology. Any transcriptional  errors that result from this process are unintentional and may not be corrected upon review.

## 2019-06-07 NOTE — Anesthesia Postprocedure Evaluation (Signed)
Anesthesia Post Note  Patient: Howard [name]  Procedure(s) Performed: COLONOSCOPY WITH PROPOFOL (N/A )  Patient location during evaluation: PACU Anesthesia Type: General Level of consciousness: awake and alert and patient cooperative Pain management: satisfactory to patient Vital Signs Assessment: post-procedure vital signs reviewed and stable Respiratory status: spontaneous breathing Cardiovascular status: stable Postop Assessment: no apparent nausea or vomiting Anesthetic complications: no     Last Vitals:  Vitals:   06/07/19 0813 06/07/19 1027  BP: 117/81 112/66  Pulse: 74 71  Resp: 18 13  Temp: 36.8 C (!) 36.4 C  SpO2: 95% 100%    Last Pain:  Vitals:   06/07/19 1027  TempSrc:   PainSc: (P) 0-No pain                 Quin Mcpherson

## 2019-06-07 NOTE — Transfer of Care (Signed)
Immediate Anesthesia Transfer of Care Note  Patient: Howard [name]  Procedure(s) Performed: COLONOSCOPY WITH PROPOFOL (N/A )  Patient Location: PACU  Anesthesia Type:General  Level of Consciousness: awake and patient cooperative  Airway & Oxygen Therapy: Patient Spontanous Breathing  Post-op Assessment: Report given to RN and Post -op Vital signs reviewed and stable  Post vital signs: Reviewed and stable  Last Vitals:  Vitals Value Taken Time  BP 112/66   Temp 97.5   Pulse 71 06/07/19 1027  Resp 13 06/07/19 1027  SpO2 100 % 06/07/19 1027  Vitals shown include unvalidated device data.  Last Pain:  Vitals:   06/07/19 0813  TempSrc: Oral  PainSc:       Patients Stated Pain Goal: 7 (06/07/19 0805)  Complications: No apparent anesthesia complications

## 2019-06-07 NOTE — Anesthesia Procedure Notes (Signed)
Date/Time: 06/07/2019 9:57 AM Performed by: Franco Nones, CRNA Pre-anesthesia Checklist: Patient identified, Emergency Drugs available, Suction available, Timeout performed and Patient being monitored Patient Re-evaluated:Patient Re-evaluated prior to induction Oxygen Delivery Method: Non-rebreather mask

## 2019-06-07 NOTE — Op Note (Signed)
[facility] Patient Name: Howard [name] Procedure Date: 06/07/2019 9:40 AM MRN: [id number] Date of Birth: 1966-04-21 Attending MD: Gennette Pac , MD CSN: [id number] Age: 54 Admit Type: Outpatient Procedure:                Colonoscopy Indications:              Screening for colorectal malignant neoplasm Providers:                Gennette Pac, MD, Jannett Celestine, RN, Dyann Ruddle Referring MD:              Medicines:                Propofol per Anesthesia Complications:            No immediate complications. Estimated Blood Loss:     Estimated blood loss: none. Procedure:                Pre-Anesthesia Assessment:                           - Prior to the procedure, a History and Physical                            was performed, and patient medications and                            allergies were reviewed. The patient's tolerance of                            previous anesthesia was also reviewed. The risks                            and benefits of the procedure and the sedation                            options and risks were discussed with the patient.                            All questions were answered, and informed consent                            was obtained. Prior Anticoagulants: The patient has                            taken no previous anticoagulant or antiplatelet                            agents. ASA Grade Assessment: II - A patient with                            mild systemic disease. After reviewing the risks  and benefits, the patient was deemed in                            satisfactory condition to undergo the procedure.                           After obtaining informed consent, the colonoscope                            was passed under direct vision. Throughout the                            procedure, the patient's blood pressure, pulse, and                            oxygen  saturations were monitored continuously. The                            CF-[id number] ([id number]) scope was introduced through                            the anus and advanced to the the cecum, identified                            by appendiceal orifice and ileocecal valve. The                            colonoscopy was performed without difficulty. The                            patient tolerated the procedure well. The quality                            of the bowel preparation was adequate. Scope In: 10:04:48 AM Scope Out: 10:15:01 AM Scope Withdrawal Time: 0 hours 6 minutes 23 seconds  Total Procedure Duration: 0 hours 10 minutes 13 seconds  Findings:      The perianal and digital rectal examinations were normal.      Scattered small-mouthed diverticula were found in the sigmoid colon and       descending colon.      The exam was otherwise without abnormality on direct and retroflexion       views.      Non-bleeding internal hemorrhoids were found during retroflexion. The       hemorrhoids were moderate, medium-sized and Grade I (internal       hemorrhoids that do not prolapse). Impression:               - Diverticulosis in the sigmoid colon and in the                            descending colon.                           - The examination was otherwise normal on direct  and retroflexion views.                           - Non-bleeding internal hemorrhoids.                           - No specimens collected. Moderate Sedation:      Moderate (conscious) sedation was personally administered by an       anesthesia professional. The following parameters were monitored: oxygen       saturation, heart rate, blood pressure, respiratory rate, EKG, adequacy       of pulmonary ventilation, and response to care. Recommendation:           - Patient has a contact number available for                            emergencies. The signs and symptoms of potential                             delayed complications were discussed with the                            patient. Return to normal activities tomorrow.                            Written discharge instructions were provided to the                            patient.                           - Resume previous diet.                           - Continue present medications.                           - Repeat colonoscopy in 10 years for screening                            purposes.                           - Return to GI office PRN. Procedure Code(s):        --- Professional ---                           [id number], Colonoscopy, flexible; diagnostic, including                            collection of specimen(s) by brushing or washing,                            when performed (separate procedure) Diagnosis Code(s):        --- Professional ---  Z12.11, Encounter for screening for malignant                            neoplasm of colon                           K57.30, Diverticulosis of large intestine without                            perforation or abscess without bleeding CPT copyright 2019 American Medical Association. All rights reserved. The codes documented in this report are preliminary and upon coder review may  be revised to meet current compliance requirements. Howard [name]. Howard Tramell, MD Gennette Pac, MD 06/07/2019 10:24:17 AM This report has been signed electronically. Number of Addenda: 0

## 2019-06-07 NOTE — Discharge Instructions (Signed)
Colonoscopy Discharge Instructions  Read the instructions outlined below and refer to this sheet in the next few weeks. These discharge instructions provide you with general information on caring for yourself after you leave the hospital. Your doctor may also give you specific instructions. While your treatment has been planned according to the most current medical practices available, unavoidable complications occasionally occur. If you have any problems or questions after discharge, call Dr. Jena Gauss at [phone]. ACTIVITY  You may resume your regular activity, but move at a slower pace for the next 24 hours.   Take frequent rest periods for the next 24 hours.   Walking will help get rid of the air and reduce the bloated feeling in your belly (abdomen).   No driving for 24 hours (because of the medicine (anesthesia) used during the test).    Do not sign any important legal documents or operate any machinery for 24 hours (because of the anesthesia used during the test).  NUTRITION  Drink plenty of fluids.   You may resume your normal diet as instructed by your doctor.   Begin with a light meal and progress to your normal diet. Heavy or fried foods are harder to digest and may make you feel sick to your stomach (nauseated).   Avoid alcoholic beverages for 24 hours or as instructed.  MEDICATIONS  You may resume your normal medications unless your doctor tells you otherwise.  WHAT YOU CAN EXPECT TODAY  Some feelings of bloating in the abdomen.   Passage of more gas than usual.   Spotting of blood in your stool or on the toilet paper.  IF YOU HAD POLYPS REMOVED DURING THE COLONOSCOPY:  No aspirin products for 7 days or as instructed.   No alcohol for 7 days or as instructed.   Eat a soft diet for the next 24 hours.  FINDING OUT THE RESULTS OF YOUR TEST Not all test results are available during your visit. If your test results are not back during the visit, make an appointment  with your caregiver to find out the results. Do not assume everything is normal if you have not heard from your caregiver or the medical facility. It is important for you to follow up on all of your test results.  SEEK IMMEDIATE MEDICAL ATTENTION IF:  You have more than a spotting of blood in your stool.   Your belly is swollen (abdominal distention).   You are nauseated or vomiting.   You have a temperature over 101.   You have abdominal pain or discomfort that is severe or gets worse throughout the day.    Diverticulosis information provided  Recommend repeat colonoscopy for screening purposes in 10 years  At patient request, I called [facility] Citizen at [phone] and reviewed results.     Diverticulosis  Diverticulosis is a condition that develops when small pouches (diverticula) form in the wall of the large intestine (colon). The colon is where water is absorbed and stool (feces) is formed. The pouches form when the inside layer of the colon pushes through weak spots in the outer layers of the colon. You may have a few pouches or many of them. The pouches usually do not cause problems unless they become inflamed or infected. When this happens, the condition is called diverticulitis. What are the causes? The cause of this condition is not known. What increases the risk? The following factors may make you more likely to develop this condition:  Being older than age 40. Your risk  for this condition increases with age. Diverticulosis is rare among people younger than age 52. By age 53, many people have it.  Eating a low-fiber diet.  Having frequent constipation.  Being overweight.  Not getting enough exercise.  Smoking.  Taking over-the-counter pain medicines, like aspirin and ibuprofen.  Having a family history of diverticulosis. What are the signs or symptoms? In most people, there are no symptoms of this condition. If you do have symptoms, they may  include:  Bloating.  Cramps in the abdomen.  Constipation or diarrhea.  Pain in the lower left side of the abdomen. How is this diagnosed? Because diverticulosis usually has no symptoms, it is most often diagnosed during an exam for other colon problems. The condition may be diagnosed by:  Using a flexible scope to examine the colon (colonoscopy).  Taking an X-ray of the colon after dye has been put into the colon (barium enema).  Having a CT scan. How is this treated? You may not need treatment for this condition. Your health care provider may recommend treatment to prevent problems. You may need treatment if you have symptoms or if you previously had diverticulitis. Treatment may include:  Eating a high-fiber diet.  Taking a fiber supplement.  Taking a live bacteria supplement (probiotic).  Taking medicine to relax your colon. Follow these instructions at home: Medicines  Take over-the-counter and prescription medicines only as told by your health care provider.  If told by your health care provider, take a fiber supplement or probiotic. Constipation prevention Your condition may cause constipation. To prevent or treat constipation, you may need to:  Drink enough fluid to keep your urine pale yellow.  Take over-the-counter or prescription medicines.  Eat foods that are high in fiber, such as beans, whole grains, and fresh fruits and vegetables.  Limit foods that are high in fat and processed sugars, such as fried or sweet foods.  General instructions  Try not to strain when you have a bowel movement.  Keep all follow-up visits as told by your health care provider. This is important. Contact a health care provider if you:  Have pain in your abdomen.  Have bloating.  Have cramps.  Have not had a bowel movement in 3 days. Get help right away if:  Your pain gets worse.  Your bloating becomes very bad.  You have a fever or chills, and your symptoms  suddenly get worse.  You vomit.  You have bowel movements that are bloody or black.  You have bleeding from your rectum. Summary  Diverticulosis is a condition that develops when small pouches (diverticula) form in the wall of the large intestine (colon).  You may have a few pouches or many of them.  This condition is most often diagnosed during an exam for other colon problems.  Treatment may include increasing the fiber in your diet, taking supplements, or taking medicines. This information is not intended to replace advice given to you by your health care provider. Make sure you discuss any questions you have with your health care provider. Document Revised: 11/30/2018 Document Reviewed: 11/30/2018 Elsevier Patient Education  [place] After These instructions provide you with information about caring for yourself after your procedure. Your health care provider may also give you more specific instructions. Your treatment has been planned according to current medical practices, but problems sometimes occur. Call your health care provider if you have any problems or questions after your procedure.  What can I expect after the procedure? After your procedure, you may:  Feel sleepy for several hours.  Feel clumsy and have poor balance for several hours.  Feel forgetful about what happened after the procedure.  Have poor judgment for several hours.  Feel nauseous or vomit.  Have a sore throat if you had a breathing tube during the procedure. Follow these instructions at home: For at least 24 hours after the procedure:      Have a responsible adult stay with you. It is important to have someone help care for you until you are awake and alert.  Rest as needed.  Do not: ? Participate in activities in which you could fall or become injured. ? Drive. ? Use heavy machinery. ? Drink alcohol. ? Take sleeping pills or medicines  that cause drowsiness. ? Make important decisions or sign legal documents. ? Take care of children on your own. Eating and drinking  Follow the diet that is recommended by your health care provider.  If you vomit, drink water, juice, or soup when you can drink without vomiting.  Make sure you have little or no nausea before eating solid foods. General instructions  Take over-the-counter and prescription medicines only as told by your health care provider.  If you have sleep apnea, surgery and certain medicines can increase your risk for breathing problems. Follow instructions from your health care provider about wearing your sleep device: ? Anytime you are sleeping, including during daytime naps. ? While taking prescription pain medicines, sleeping medicines, or medicines that make you drowsy.  If you smoke, do not smoke without supervision.  Keep all follow-up visits as told by your health care provider. This is important. Contact a health care provider if:  You keep feeling nauseous or you keep vomiting.  You feel light-headed.  You develop a rash.  You have a fever. Get help right away if:  You have trouble breathing. Summary  For several hours after your procedure, you may feel sleepy and have poor judgment.  Have a responsible adult stay with you for at least 24 hours or until you are awake and alert. This information is not intended to replace advice given to you by your health care provider. Make sure you discuss any questions you have with your health care provider. Document Revised: 08/01/2017 Document Reviewed: 08/24/2015 Elsevier Patient Education  2020 [employer].

## 2019-07-12 ENCOUNTER — Ambulatory Visit: Payer: 59 | Admitting: Gastroenterology

## 2019-07-12 ENCOUNTER — Encounter: Payer: Self-pay | Admitting: Gastroenterology

## 2019-07-12 ENCOUNTER — Other Ambulatory Visit: Payer: Self-pay

## 2019-07-12 DIAGNOSIS — K760 Fatty (change of) liver, not elsewhere classified: Secondary | ICD-10-CM | POA: Diagnosis not present

## 2019-07-12 NOTE — Progress Notes (Signed)
Referring Provider: Elfredia Nevins, MD Primary Care Physician:  Elfredia Nevins, MD  Primary GI: Dr. Jena Gauss   Chief Complaint  Patient presents with  . Follow-up    HPI:   Howard [name] is a 54 y.o. male presenting today with a history of elevated LFTs in setting of alcohol use, chronically dilated CBD with last [place] Oct 2020 and stable. CBD 11 mm. Fatty liver on ultrasound. Most recent HFP normal. GGT normal. Colonoscopy completed in interim from last appointment with diverticulosis, otherwise normal. Non-bleeding internal hemorrhoids.   No drinking since Oct 2019. Chronic methadone. No abdominal pain, N/V, constipation, GERD, overt GI bleeding, dysphagia, jaundice, weight loss, lack of appetite. Active with cardio 6 days a week. Light weights several times a week. Knows he needs to cut back on sodas. Eager to make dietary changes and do what it takes to keep himself healthy.   Past Medical History:  Diagnosis Date  . Alcohol abuse   . Anxiety   . Chronic back pain   . Chronic pain   . Depression   . Fall   . Fracture of right elbow   . Osteoarthritis   . Psoriasis     Past Surgical History:  Procedure Laterality Date  . APPENDECTOMY    . COLONOSCOPY WITH PROPOFOL N/A 06/07/2019   diverticulosis, otherwise normal. Non-bleeding internal hemorrhoids.   Marland Kitchen FOOT SURGERY     per patient: "neurofibroma excision"  . LUMBAR LAMINECTOMY/DECOMPRESSION MICRODISCECTOMY Bilateral 12/31/2015   Procedure: LUMBER DECOMPRESSION L4-5/ L5-S1 BILATERALLY  ;  Surgeon: Jene Every, MD;  Location: [facility] ORS;  Service: Orthopedics;  Laterality: Bilateral;  . REVERSE SHOULDER ARTHROPLASTY Right 10/24/2018  . REVERSE SHOULDER ARTHROPLASTY Right 10/24/2018   Procedure: REVERSE SHOULDER ARTHROPLASTY;  Surgeon: Yolonda Kida, MD;  Location[facility];  Service: Orthopedics;  Laterality: Right;  . SHOULDER SURGERY Right   . TOTAL HIP ARTHROPLASTY    . TOTAL HIP ARTHROPLASTY Left 08/27/2014   Procedure: LEFT TOTAL HIP ARTHROPLASTY ANTERIOR APPROACH;  Surgeon: Durene Romans, MD;  Location: [facility] OR[facility];  Service: Orthopedics;  Laterality: Left;    Current Outpatient Medications  Medication Sig Dispense Refill  . amphetamine-dextroamphetamine (ADDERALL) 20 MG tablet Take 20 mg by mouth 2 (two) times daily with a meal.     . buPROPion (WELLBUTRIN) 100 MG tablet Take 100 mg by mouth 2 (two) times daily.    . busPIRone (BUSPAR) 30 MG tablet Take 30 mg by mouth 2 (two) times daily.    Lorenso Quarry SENSOREADY PEN 150 MG/ML SOAJ Inject 150 mg into the skin every 14 (fourteen) days.     Marland Kitchen esomeprazole (NEXIUM) 40 MG capsule Take 40 mg by mouth daily as needed (acid reflux/indigestion.).     Marland Kitchen ibuprofen (ADVIL,MOTRIN) 800 MG tablet Take 800 mg by mouth every 8 (eight) hours as needed (pain).     Marland Kitchen levothyroxine (SYNTHROID, LEVOTHROID) 25 MCG tablet Take 25 mcg by mouth daily before breakfast.     . methadone (DOLOPHINE) 10 MG/ML solution Take 20 mg by mouth daily.      No current facility-administered medications for this visit.    Allergies as of 07/12/2019  . (No Known Allergies)    Family History  Problem Relation Age of Onset  . Colon cancer Neg Hx     Social History   Socioeconomic History  . Marital status: Married    Spouse name: Not on file  . Number of children: Not on file  . Years of  education: Not on file  . Highest education level: Not on file  Occupational History  . Not on file  Tobacco Use  . Smoking status: Current Every Day Smoker    Packs/day: 1.00    Years: 32.00    Pack years: 32.00    Types: Cigarettes  . Smokeless tobacco: Never Used  Substance and Sexual Activity  . Alcohol use: Not Currently    Alcohol/week: 16.0 standard drinks    Types: 6 Glasses of wine, 10 Cans of beer per week    Comment: None currently (last ETOH 03/01/2018); previously drinks 3 to 4 beers daily and 1 glass of wine; "way more" on the weekend  . Drug use: No    Comment: hx of  marijuana use years ago   . Sexual activity: Not on file  Other Topics Concern  . Not on file  Social History Narrative  . Not on file   Social Determinants of Health   Financial Resource Strain:   . Difficulty of Paying Living Expenses: Not on file  Food Insecurity:   . Worried About Charity fundraiser in the Last Year: Not on file  . Ran Out of Food in the Last Year: Not on file  Transportation Needs:   . Lack of Transportation (Medical): Not on file  . Lack of Transportation (Non-Medical): Not on file  Physical Activity:   . Days of Exercise per Week: Not on file  . Minutes of Exercise per Session: Not on file  Stress:   . Feeling of Stress : Not on file  Social Connections:   . Frequency of Communication with Friends and Family: Not on file  . Frequency of Social Gatherings with Friends and Family: Not on file  . Attends Religious Services: Not on file  . Active Member of Clubs or Organizations: Not on file  . Attends Archivist Meetings: Not on file  . Marital Status: Not on file    Review of Systems: Gen: Denies fever, chills, anorexia. Denies fatigue, weakness, weight loss.  CV: Denies chest pain, palpitations, syncope, peripheral edema, and claudication. Resp: Denies dyspnea at rest, cough, wheezing, coughing up blood, and pleurisy. GI: see HPI  Derm: Denies rash, itching, dry skin Psych: Denies depression, anxiety, memory loss, confusion. No homicidal or suicidal ideation.  Heme: Denies bruising, bleeding, and enlarged lymph nodes.  Physical Exam: BP 106/63   Pulse 61   Temp (!) 97.1 F (36.2 C) (Temporal)   Ht 5\' 11"  (1.803 m)   Wt 194 lb 6.4 oz (88.2 kg)   BMI 27.11 kg/m  General:   Alert and oriented. No distress noted. Pleasant and cooperative.  Head:  Normocephalic and atraumatic. Eyes:  Conjuctiva clear without scleral icterus. Abdomen:  +BS, soft, non-tender and non-distended. No rebound or guarding. No HSM or masses noted. Msk:   Symmetrical without gross deformities. Normal posture. Extremities:  Without edema. Neurologic:  Alert and  oriented x4 Psych:  Alert and cooperative. Normal mood and affect.  ASSESSMENT: Howard [name] is a 54 y.o. male presenting today with history of elevated LFTs now resolved, known fatty liver, history of ETOH abuse but has been sober since 2019, returning in follow-up today. Chronic CBD dilation at 11 mm noted on imaging.  Elevated LFTs: now resolved. Likely due to ETOH/fatty liver. Encouragingly, he has had normal LFTs in Oct 2020 and most recently Jan 2021. Applauded on alcohol cessation. Discussed fatty liver management.  CBD dilation: gallbladder present. He does take  chronic narcotics, which is likely contributing. As his LFTs are now normal after avoidance of ETOH, will hold off on MRI. However, we will follow HFP closely and if any bumps, recommend MRCP. He has no alarm signs/symptoms.    PLAN:  Return in 1 year  HFP in 6 months  Pursue MRI/MRCP if bump in LFTs  Continue diet/exercise   Fatty liver diet provided  Colonoscopy again in 10 years  Gelene Mink, PhD, ANP-BC [facility]

## 2019-07-12 NOTE — Progress Notes (Signed)
Cc'ed to pcp °

## 2019-07-12 NOTE — Patient Instructions (Addendum)
We will recheck your blood work in 6 months. If any bump again in your numbers, we can pursue further imaging.  Please call with pain, yellowing of skin or eyes, nausea, vomiting, changes in bowel habits.  We will see you in 1 year!  It was a pleasure to see you today. I want to create trusting relationships with patients to provide genuine, compassionate, and quality care. I value your feedback. If you receive a survey regarding your visit,  I greatly appreciate you taking time to fill this out.   Gelene Mink, PhD, ANP-BC [facility]    Fatty Liver Disease  Fatty liver disease occurs when too much fat has built up in your liver cells. Fatty liver disease is also called hepatic steatosis or steatohepatitis. The liver removes harmful substances from your bloodstream and produces fluids that your body needs. It also helps your body use and store energy from the food you eat. In many cases, fatty liver disease does not cause symptoms or problems. It is often diagnosed when tests are being done for other reasons. However, over time, fatty liver can cause inflammation that may lead to more serious liver problems, such as scarring of the liver (cirrhosis) and liver failure. Fatty liver is associated with insulin resistance, increased body fat, high blood pressure (hypertension), and high cholesterol. These are features of metabolic syndrome and increase your risk for stroke, diabetes, and heart disease. What are the causes? This condition may be caused by:  Drinking too much alcohol.  Poor nutrition.  Obesity.  Cushing's syndrome.  Diabetes.  High cholesterol.  Certain drugs.  Poisons.  Some viral infections.  Pregnancy. What increases the risk? You are more likely to develop this condition if you:  Abuse alcohol.  Are overweight.  Have diabetes.  Have hepatitis.  Have a high triglyceride level.  Are pregnant. What are the signs or symptoms? Fatty  liver disease often does not cause symptoms. If symptoms do develop, they can include:  Fatigue.  Weakness.  Weight loss.  Confusion.  Abdominal pain.  Nausea and vomiting.  Yellowing of your skin and the white parts of your eyes (jaundice).  Itchy skin. How is this diagnosed? This condition may be diagnosed by:  A physical exam and medical history.  Blood tests.  Imaging tests, such as an ultrasound, CT scan, or MRI.  A liver biopsy. A small sample of liver tissue is removed using a needle. The sample is then looked at under a microscope. How is this treated? Fatty liver disease is often caused by other health conditions. Treatment for fatty liver may involve medicines and lifestyle changes to manage conditions such as:  Alcoholism.  High cholesterol.  Diabetes.  Being overweight or obese. Follow these instructions at home:   Do not drink alcohol. If you have trouble quitting, ask your health care provider how to safely quit with the help of medicine or a supervised program. This is important to keep your condition from getting worse.  Eat a healthy diet as told by your health care provider. Ask your health care provider about working with a diet and nutrition specialist (dietitian) to develop an eating plan.  Exercise regularly. This can help you lose weight and control your cholesterol and diabetes. Talk to your health care provider about an exercise plan and which activities are best for you.  Take over-the-counter and prescription medicines only as told by your health care provider.  Keep all follow-up visits as told by your health  care provider. This is important. Contact a health care provider if: You have trouble controlling your:  Blood sugar. This is especially important if you have diabetes.  Cholesterol.  Drinking of alcohol. Get help right away if:  You have abdominal pain.  You have jaundice.  You have nausea and vomiting.  You vomit  blood or material that looks like coffee grounds.  You have stools that are black, tar-like, or bloody. Summary  Fatty liver disease develops when too much fat builds up in the cells of your liver.  Fatty liver disease often causes no symptoms or problems. However, over time, fatty liver can cause inflammation that may lead to more serious liver problems, such as scarring of the liver (cirrhosis).  You are more likely to develop this condition if you abuse alcohol, are pregnant, are overweight, have diabetes, have hepatitis, or have high triglyceride levels.  Contact your health care provider if you have trouble controlling your weight, blood sugar, cholesterol, or drinking of alcohol. This information is not intended to replace advice given to you by your health care provider. Make sure you discuss any questions you have with your health care provider. Document Revised: 04/15/2017 Document Reviewed: 02/09/2017 Elsevier Patient Education  2020 [employer].

## 2019-08-06 ENCOUNTER — Ambulatory Visit: Payer: 59 | Admitting: Podiatry

## 2019-08-13 IMAGING — MR MR FOOT*L* WO/W CM
8 series · 40 of 40 positions shown · IV contrast (multihance)
Comparison: None.

CLINICAL DATA: Chronic left foot pain and numbness.

EXAM:
MRI OF THE LEFT FOREFOOT WITHOUT AND WITH CONTRAST
TECHNIQUE: Multiplanar, multisequence MR imaging of the left forefoot was
performed both before and after administration of intravenous
contrast.
CONTRAST:  18 mL MULTIHANCE GADOBENATE DIMEGLUMINE 529 MG/ML IV SOLN

[Series 5: T1 · coronal · 3.0mm · 0.44mm/px · 7 of 36 slices shown (1 of 2)]
[im 1/36]
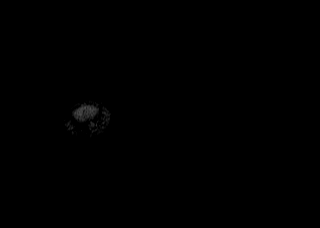
[im 6/36]
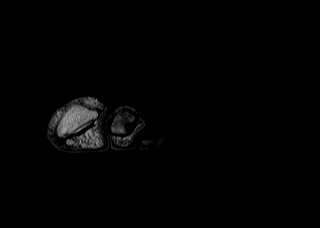
[im 12/36]
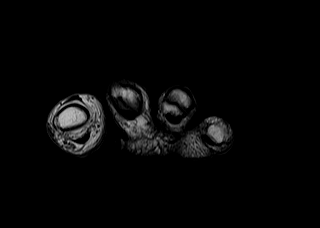
[im 18/36]
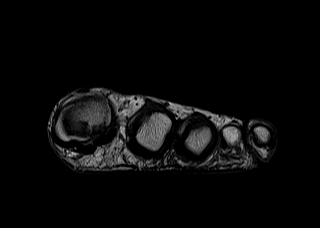
[im 24/36]
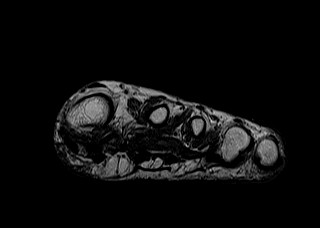
[im 30/36]
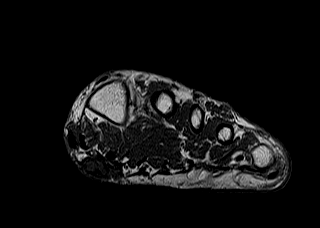
[im 36/36]
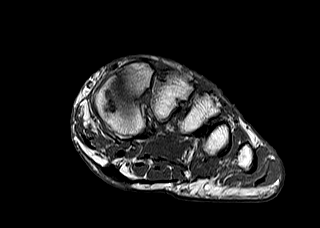

[Series 7: T2 fat-sat · axial · 3.0mm · 0.66mm/px · z∈[-93,-36]mm · 4 of 16 slices shown (1 of 2)]
[im 1/16]
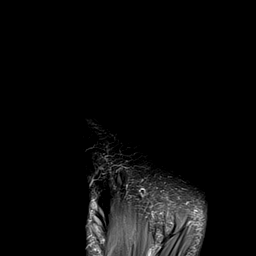
[im 6/16]
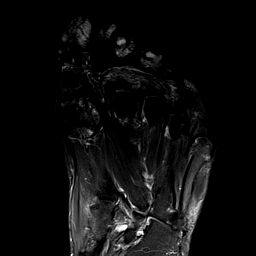
[im 11/16]
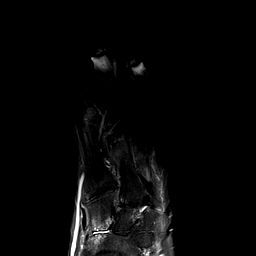
[im 16/16]
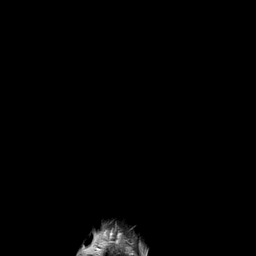

[Series 8: T2 fat-sat · coronal · 3.0mm · 0.55mm/px · 6 of 36 slices shown (2 of 2)]
[im 1/36]
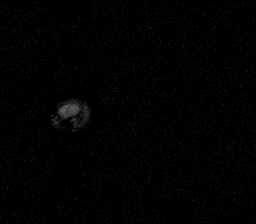
[im 8/36]
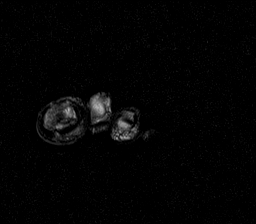
[im 15/36]
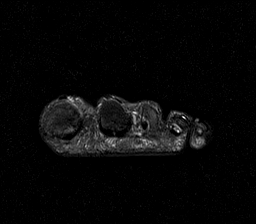
[im 22/36]
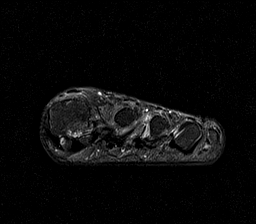
[im 29/36]
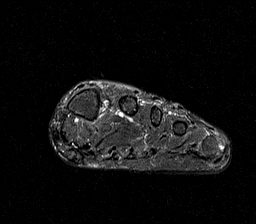
[im 36/36]
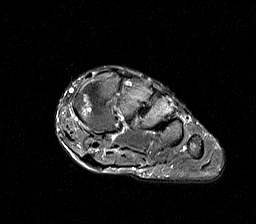

[Series 9: T1 · axial · 3.0mm · 0.53mm/px · z∈[-93,-36]mm · 3 of 16 slices shown (2 of 2)]
[im 1/16]
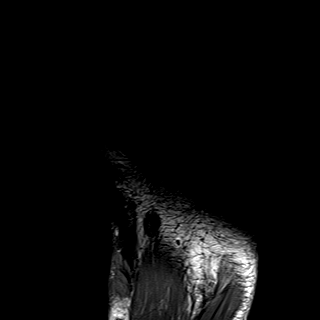
[im 8/16]
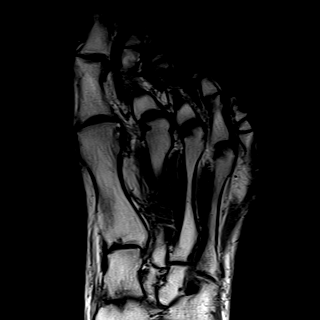
[im 16/16]
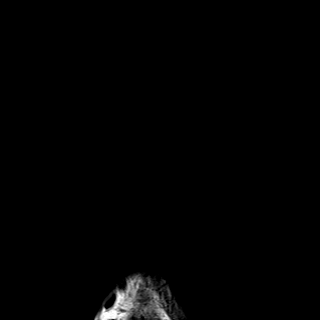

[Series 10: PD fat-sat · sagittal · 3.0mm · 0.44mm/px · 5 of 30 slices shown]
[im 1/30]
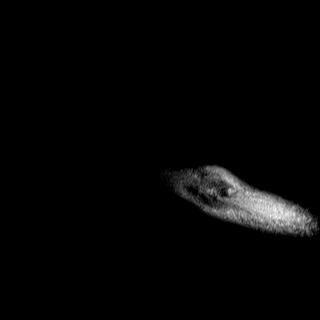
[im 8/30]
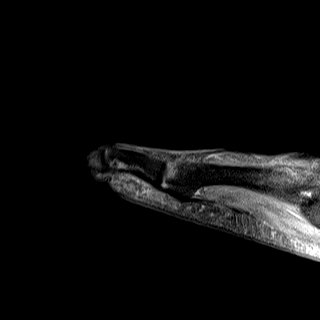
[im 15/30]
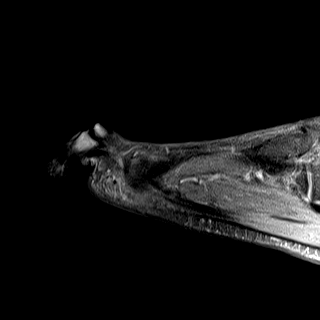
[im 22/30]
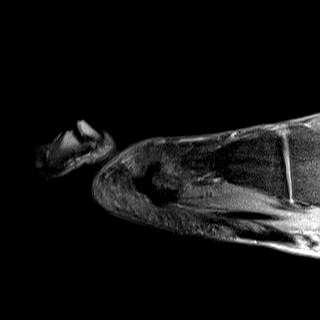
[im 30/30]
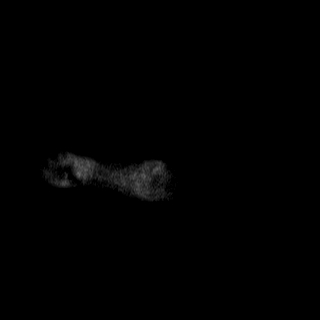

[Series 11: T1 fat-sat · coronal · non-contrast · 3.0mm · 0.55mm/px · 6 of 36 slices shown]
[im 1/36]
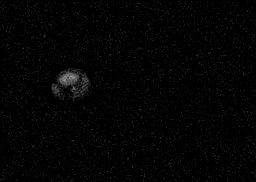
[im 8/36]
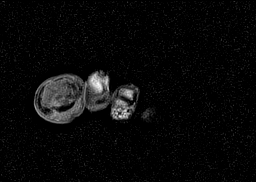
[im 15/36]
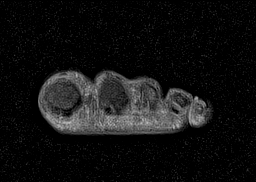
[im 22/36]
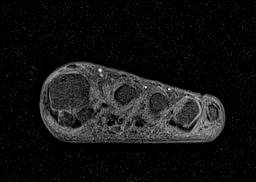
[im 29/36]
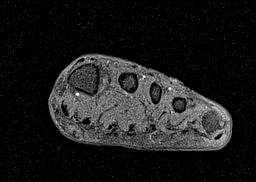
[im 36/36]
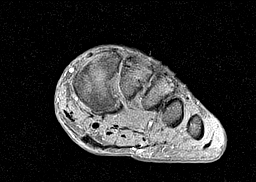

[Series 12: T1 fat-sat post-contrast · coronal · 3.0mm · 0.55mm/px · 6 of 36 slices shown (1 of 2)]
[im 1/36]
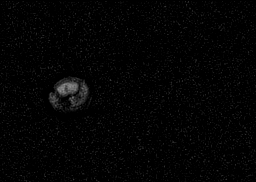
[im 8/36]
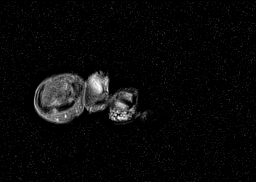
[im 15/36]
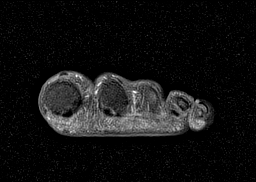
[im 22/36]
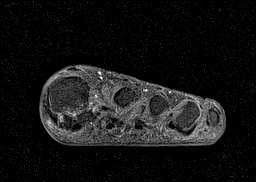
[im 29/36]
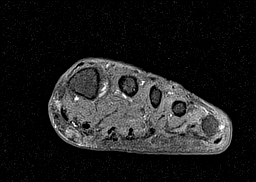
[im 36/36]
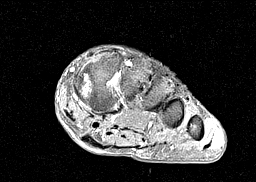

[Series 13: T1 fat-sat post-contrast · axial · 3.0mm · 0.53mm/px · z∈[-93,-36]mm · 3 of 16 slices shown (2 of 2)]
[im 1/16]
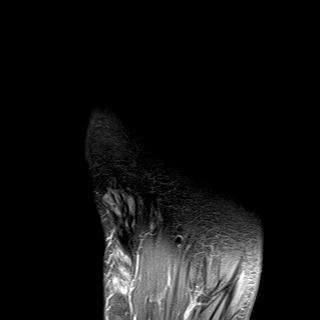
[im 8/16]
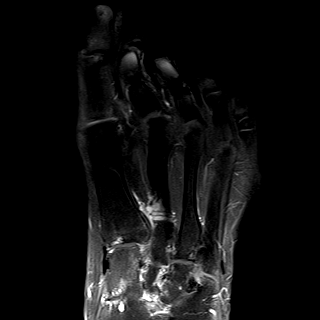
[im 16/16]
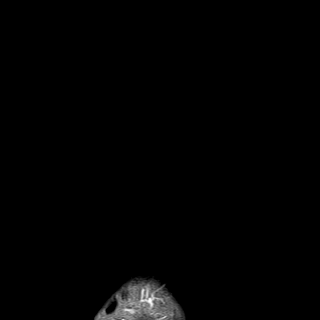

[40 of 40 positions shown; findings below may reference images not displayed]

FINDINGS: Bones/Joint/Cartilage

No acute bony or joint abnormality is identified. Since the prior
examination, there has been progression of advanced osteoarthritis
at the articulation of the navicular and medial cuneiform where
there is increased joint space narrowing, subchondral cyst formation
and edema. Mild to moderate first MTP osteoarthritis has also
worsened with new small subchondral cysts seen in the first
metatarsal head. No fracture or worrisome lesion is identified. No
stress change.

Ligaments

Intact.

Muscles and Tendons

Intact and normal in appearance.

Soft tissues

Again seen is a plantar fibroma subjacent to the first metatarsal
which measures approximately 1.8 cm transverse by 2.7 cm long by
cm craniocaudal. On the prior exam, the lesion was larger measuring
2 cm transverse by 1 cm craniocaudal by 5.4 cm long. Second plantar
fibroma more medial of the along the plantar surface of the foot
seen on the prior examination is no longer visualized. A third small
plantar fibroma measures 0.8 cm transverse by 0.6 cm craniocaudal by
1.2 cm long appears to be new since the prior exam. This lesion is
positioned between the first and second metatarsals at the level of
the metaphysis of the bones.
IMPRESSION: Plantar fibroma deep to the first metatarsal measures 1.8 x 2.7 x
0.6 cm and measures smaller than the prior examination. A second
plantar fibroma medial to the dominant lesion seen on the prior
examination is no longer present. New small plantar fibroma is seen
between the first and second tarsometatarsal.

Advanced osteoarthritis about the articulation of the navicular and
medial cuneiform has worsened since the prior examination. Mild to
moderate first MTP osteoarthritis is also progressed.

## 2019-12-06 ENCOUNTER — Other Ambulatory Visit: Payer: Self-pay

## 2019-12-06 DIAGNOSIS — K760 Fatty (change of) liver, not elsewhere classified: Secondary | ICD-10-CM

## 2020-01-15 ENCOUNTER — Other Ambulatory Visit: Payer: Self-pay | Admitting: Specialist

## 2020-01-15 DIAGNOSIS — M546 Pain in thoracic spine: Secondary | ICD-10-CM

## 2020-01-25 ENCOUNTER — Ambulatory Visit
Admission: RE | Admit: 2020-01-25 | Discharge: 2020-01-25 | Disposition: A | Discharge status: Home / Self Care | Payer: 59 | Source: Ambulatory Visit | Attending: Specialist | Admitting: Specialist

## 2020-01-25 ENCOUNTER — Other Ambulatory Visit: Payer: Self-pay | Admitting: Specialist

## 2020-01-25 DIAGNOSIS — M546 Pain in thoracic spine: Secondary | ICD-10-CM

## 2020-02-02 LAB — HEPATIC FUNCTION PANEL
AG Ratio: 2.4 (calc) (ref 1.0–2.5)
ALT: 38 U/L (ref 9–46)
AST: 38 U/L — ABNORMAL HIGH (ref 10–35)
Albumin: 4 g/dL (ref 3.6–5.1)
Alkaline phosphatase (APISO): 111 U/L (ref 35–144)
Bilirubin, Direct: 0.2 mg/dL (ref 0.0–0.2)
Globulin: 1.7 g/dL (calc) — ABNORMAL LOW (ref 1.9–3.7)
Indirect Bilirubin: 0.6 mg/dL (calc) (ref 0.2–1.2)
Total Bilirubin: 0.8 mg/dL (ref 0.2–1.2)
Total Protein: 5.7 g/dL — ABNORMAL LOW (ref 6.1–8.1)

## 2020-02-11 ENCOUNTER — Other Ambulatory Visit: Payer: Self-pay

## 2020-02-11 DIAGNOSIS — K760 Fatty (change of) liver, not elsewhere classified: Secondary | ICD-10-CM

## 2020-03-08 ENCOUNTER — Other Ambulatory Visit: Payer: Self-pay

## 2020-03-08 ENCOUNTER — Ambulatory Visit
Admission: EM | Admit: 2020-03-08 | Discharge: 2020-03-08 | Disposition: A | Discharge status: Home / Self Care | Payer: 59 | Attending: Emergency Medicine | Admitting: Emergency Medicine

## 2020-03-08 ENCOUNTER — Encounter: Payer: Self-pay | Admitting: Emergency Medicine

## 2020-03-08 DIAGNOSIS — L409 Psoriasis, unspecified: Secondary | ICD-10-CM

## 2020-03-08 MED ORDER — PREDNISONE 10 MG (21) PO TBPK: ORAL_TABLET | Freq: Every day | ORAL | 0 refills | Status: DC

## 2020-03-08 MED ORDER — KETOROLAC TROMETHAMINE 10 MG PO TABS: 10.0000 mg | ORAL_TABLET | Freq: Four times a day (QID) | ORAL | 0 refills | Status: DC | PRN

## 2020-03-08 NOTE — ED Triage Notes (Signed)
Pain to LT eye that started Wednesday.  Pt is unsure if it hit it on anything. Pt changed his psoriasis arthritis medication on Tuesday.

## 2020-03-08 NOTE — Discharge Instructions (Addendum)
Prednisone was prescribed/take as directed Toradol was prescribed for pain Follow-up with PCP for recheck Return or go to ED if you develop worsening of symptoms

## 2020-03-08 NOTE — ED Provider Notes (Signed)
[facility] CARE CENTER   [id number] 03/08/20 Arrival Time: 1534  CC: eye pain  SUBJECTIVE:  Howard [name] is a 54 y.o. male history of psoriasis presented to the urgent care with a complaint of psoriasis rash around left eye.  Denies a precipitating event, trauma, or close contacts with similar symptoms.  Reported rash is painful and itchy.  Has tried OTC medication without relief.  Denies similar symptoms in the past.denies alleviating or aggravating factors.  Denies fever, chills, nausea, vomiting, eye pain, painful eye movements, halos, discharge, itching, vision changes, double vision, FB sensation, periorbital erythema.     Denies contact lens use.    ROS: As per HPI.  All other pertinent ROS negative.     Past Medical History:  Diagnosis Date   Alcohol abuse    Anxiety    Chronic back pain    Chronic pain    Depression    Fall    Fracture of right elbow    Osteoarthritis    Psoriasis    Past Surgical History:  Procedure Laterality Date   APPENDECTOMY     COLONOSCOPY WITH PROPOFOL N/A 06/07/2019   diverticulosis, otherwise normal. Non-bleeding internal hemorrhoids.    FOOT SURGERY     per patient: "neurofibroma excision"   LUMBAR LAMINECTOMY/DECOMPRESSION MICRODISCECTOMY Bilateral 12/31/2015   Procedure: LUMBER DECOMPRESSION L4-5/ L5-S1 BILATERALLY  ;  Surgeon: Jene Every, MD;  Location: [facility] ORS;  Service: Orthopedics;  Laterality: Bilateral;   REVERSE SHOULDER ARTHROPLASTY Right 10/24/2018   REVERSE SHOULDER ARTHROPLASTY Right 10/24/2018   Procedure: REVERSE SHOULDER ARTHROPLASTY;  Surgeon: Yolonda Kida, MD;  Location: [facility] OR;  Service: Orthopedics;  Laterality: Right;   SHOULDER SURGERY Right    TOTAL HIP ARTHROPLASTY     TOTAL HIP ARTHROPLASTY Left 08/27/2014   Procedure: LEFT TOTAL HIP ARTHROPLASTY ANTERIOR APPROACH;  Surgeon: Durene Romans, MD;  Location: [facility] ORS;  Service: Orthopedics;  Laterality: Left;   No Known Allergies No current  facility-administered medications on file prior to encounter.   Current Outpatient Medications on File Prior to Encounter  Medication Sig Dispense Refill   amphetamine-dextroamphetamine (ADDERALL) 20 MG tablet Take 20 mg by mouth 2 (two) times daily with a meal.      buPROPion (WELLBUTRIN) 100 MG tablet Take 100 mg by mouth 2 (two) times daily.     busPIRone (BUSPAR) 30 MG tablet Take 30 mg by mouth 2 (two) times daily.     COSENTYX SENSOREADY PEN 150 MG/ML SOAJ Inject 150 mg into the skin every 14 (fourteen) days.      esomeprazole (NEXIUM) 40 MG capsule Take 40 mg by mouth daily as needed (acid reflux/indigestion.).      ibuprofen (ADVIL,MOTRIN) 800 MG tablet Take 800 mg by mouth every 8 (eight) hours as needed (pain).      levothyroxine (SYNTHROID, LEVOTHROID) 25 MCG tablet Take 25 mcg by mouth daily before breakfast.      methadone (DOLOPHINE) 10 MG/ML solution Take 20 mg by mouth daily.      Social History   Socioeconomic History   Marital status: Married    Spouse name: Not on file   Number of children: Not on file   Years of education: Not on file   Highest education level: Not on file  Occupational History   Not on file  Tobacco Use   Smoking status: Current Every Day Smoker    Packs/day: 1.00    Years: 32.00    Pack years: 32.00    Types: Cigarettes  Smokeless tobacco: Never Used  Vaping Use   Vaping Use: Never used  Substance and Sexual Activity   Alcohol use: Not Currently    Alcohol/week: 16.0 standard drinks    Types: 6 Glasses of wine, 10 Cans of beer per week    Comment: None currently (last ETOH 03/01/2018); previously drinks 3 to 4 beers daily and 1 glass of wine; "way more" on the weekend   Drug use: No    Comment: hx of marijuana use years ago    Sexual activity: Not on file  Other Topics Concern   Not on file  Social History Narrative   Not on file   Social Determinants of Health   Financial Resource Strain:    Difficulty of  Paying Living Expenses: Not on file  Food Insecurity:    Worried About Running Out of Food in the Last Year: Not on file   [employer] of Food in the Last Year: Not on file  Transportation Needs:    Lack of Transportation (Medical): Not on file   Lack of Transportation (Non-Medical): Not on file  Physical Activity:    Days of Exercise per Week: Not on file   Minutes of Exercise per Session: Not on file  Stress:    Feeling of Stress : Not on file  Social Connections:    Frequency of Communication with Friends and Family: Not on file   Frequency of Social Gatherings with Friends and Family: Not on file   Attends Religious Services: Not on file   Active Member of Clubs or Organizations: Not on file   Attends Banker Meetings: Not on file   Marital Status: Not on file  Intimate Partner Violence:    Fear of Current or Ex-Partner: Not on file   Emotionally Abused: Not on file   Physically Abused: Not on file   Sexually Abused: Not on file   Family History  Problem Relation Age of Onset   Colon cancer Neg Hx     OBJECTIVE:    Visual Acuity  Right Eye Distance:   Left Eye Distance:   Bilateral Distance:    Right Eye Near:   Left Eye Near:    Bilateral Near:      Vitals:   03/08/20 1546  BP: 117/73  Pulse: 80  Resp: 18  Temp: 97.9 F (36.6 C)  TempSrc: Oral  SpO2: 96%  Weight: 170 lb (77.1 kg)  Height: 6\' 5"  (1.956 m)    General appearance: alert; no distress Eyes: No conjunctival erythema. PERRL; EOMI without discomfort;  no obvious drainage; lid everted without obvious FB; no obvious fluorescein uptake  Neck: supple Lungs: clear to auscultation bilaterally Heart: regular rate and rhythm Skin: warm and dry, macular rash around left eye, painful and tender to touch Psychological: alert and cooperative; normal mood and affect   ASSESSMENT & PLAN:  1. Psoriasis     Meds ordered this encounter  Medications   predniSONE (STERAPRED  UNI-PAK 21 TAB) 10 MG (21) TBPK tablet    Sig: Take by mouth daily. Take 6 tabs by mouth daily  for 1 days, then 5 tabs for 1 days, then 4 tabs for 1 days, then 3 tabs for 1 days, 2 tabs for 1 days, then 1 tab by mouth daily for 1 days    Dispense:  21 tablet    Refill:  0   ketorolac (TORADOL) 10 MG tablet    Sig: Take 1 tablet (10 mg total) by  mouth every 6 (six) hours as needed.    Dispense:  20 tablet    Refill:  0     Discharge instructions  Prednisone was prescribed/take as directed Toradol was prescribed for pain Follow-up with PCP for recheck Return or go to ED if you develop worsening of symptoms  Reviewed expectations re: course of current medical issues. Questions answered. Outlined signs and symptoms indicating need for more acute intervention. Patient verbalized understanding. After Visit Summary given.   Durward Parcel, FNP 03/08/20 1607

## 2020-03-09 ENCOUNTER — Telehealth: Payer: Self-pay | Admitting: Emergency Medicine

## 2020-03-09 NOTE — Telephone Encounter (Signed)
Patient called requesting Tylenol 3 to be prescribed.  PDMP was reviewed and patient has filled tramadol 120 tablet on 02/18/2020.  Therefore Tylenol 3 was not prescribed.

## 2020-03-19 ENCOUNTER — Ambulatory Visit: Payer: 59 | Admitting: Interventional Cardiology

## 2020-03-19 ENCOUNTER — Other Ambulatory Visit: Payer: Self-pay

## 2020-03-19 ENCOUNTER — Encounter: Payer: Self-pay | Admitting: Interventional Cardiology

## 2020-03-19 VITALS — BP 110/64 | HR 72 | Ht 77.0 in | Wt 172.8 lb

## 2020-03-19 DIAGNOSIS — F1011 Alcohol abuse, in remission: Secondary | ICD-10-CM | POA: Diagnosis not present

## 2020-03-19 DIAGNOSIS — R072 Precordial pain: Secondary | ICD-10-CM | POA: Diagnosis not present

## 2020-03-19 DIAGNOSIS — E039 Hypothyroidism, unspecified: Secondary | ICD-10-CM | POA: Diagnosis not present

## 2020-03-19 DIAGNOSIS — Z720 Tobacco use: Secondary | ICD-10-CM

## 2020-03-19 DIAGNOSIS — F1111 Opioid abuse, in remission: Secondary | ICD-10-CM

## 2020-03-19 MED ORDER — ROSUVASTATIN CALCIUM 10 MG PO TABS: 10.0000 mg | ORAL_TABLET | Freq: Every day | ORAL | 3 refills | Status: DC

## 2020-03-19 MED ORDER — METOPROLOL TARTRATE 100 MG PO TABS: ORAL_TABLET | ORAL | 0 refills | Status: DC

## 2020-03-19 NOTE — Patient Instructions (Addendum)
Medication Instructions:  Your physician has recommended you make the following change in your medication:   START: Rosuvastatin 54m daily  *If you need a refill on your cardiac medications before your next appointment, please call your pharmacy*   Lab Work: BMET prior to CT - FLP, LFT in 3 mths  If you have labs (blood work) drawn today and your tests are completely normal, you will receive your results only by: .Marland KitchenMyChart Message (if you have MyChart) OR . A paper copy in the mail If you have any lab test that is abnormal or we need to change your treatment, we will call you to review the results.   Testing/Procedures: Non-Cardiac CT Angiography (CTA), is a special type of CT scan that uses a computer to produce multi-dimensional views of major blood vessels throughout the body. In CT angiography, a contrast material is injected through an IV to help visualize the blood vessels  Follow-Up: [facility].  As part of our continuing mission to provide you with exceptional heart care, we have created designated Provider Care Teams.  These Care Teams include your primary Cardiologist (physician) and Advanced Practice Providers (APPs -  Physician Assistants and Nurse Practitioners) who all work together to provide you with the care you need, when you need it.  We recommend signing up for the patient portal called "MyChart".  Sign up information is provided on this After Visit Summary.  MyChart is used to connect with patients for Virtual Visits (Telemedicine).  Patients are able to view lab/test results, encounter notes, upcoming appointments, etc.  Non-urgent messages can be sent to your provider as well.   To learn more about what you can do with MyChart, go to [email or web address]    Your next appointment:   1 year(s)  The format for your next appointment:   In Person  Provider:   You may see JLarae Grooms MD or one of the  following Advanced Practice Providers on your designated Care Team:    DMelina Copa PA-C  MErmalinda Barrios PA-C   Other Instructions  Your cardiac CT will be scheduled at one of the below locations:   [facility]GW[address] [place] [phone] [place] [phone] [facility] please arrive at the [facility] entrance of [facility] minutes prior to test start time. Pro[facility] to the [facility] (first floor) to check-in and test prep.  [facility] please arrive 15 mins early for check-in and test prep.  Please follow these instructions carefully (unless otherwise directed):  Hold all erectile dysfunction medications at least 3 days (72 hrs) prior to test.  On the Night Before the Test: . Be sure to Drink plenty of water. . Do not consume any caffeinated/decaffeinated beverages or chocolate 12 hours prior to your test. . Do not take any antihistamines 12 hours prior to your test.  On the Day of the Test: . Drink plenty of water. Do not drink any water within one hour of the test. . Do not eat any food 4 hours prior to the test. . You may take your regular medications prior to the test.  . Take metoprolol (Lopressor) two hours prior to test.       After the Test: . Drink plenty  of water. . After receiving IV contrast, you may experience a mild flushed feeling. This is normal. . On occasion, you may experience a mild rash up to 24 hours after the test. This is not dangerous. If this occurs, you can take Benadryl 25 mg and increase your fluid intake. . If you experience trouble breathing, this can be serious. If it is severe call 911 IMMEDIATELY. If it is mild, please call our office. . If you take any of these medications:  Glipizide/Metformin, Avandament, Glucavance, please do not take 48 hours after completing test unless otherwise instructed.   Once we have confirmed authorization from your insurance company, we will call you to set up a date and time for your test. Based on how quickly your insurance processes prior authorizations requests, please allow up to 4 weeks to be contacted for scheduling your Cardiac CT appointment. Be advised that routine Cardiac CT appointments could be scheduled as many as 8 weeks after your provider has ordered it.  For non-scheduling related questions, please contact the cardiac imaging nurse navigator should you have any questions/concerns: Marchia Bond, Cardiac Imaging Nurse Navigator Burley Saver, Interim Cardiac Imaging Nurse [place] [facility] Direct Office Dial: [phone]   For scheduling needs, including cancellations and rescheduling, please call Vivien Rota at [phone], option [phone].

## 2020-03-19 NOTE — Progress Notes (Signed)
Cardiology Office Note   Date:  03/19/2020   ID:  Howard [name], DOB August 08, 1965, MRN [id number]  PCP:  Elfredia Nevins, MD    No chief complaint on file.  Coronary artery calcification  Wt Readings from Last 3 Encounters:  03/19/20 172 lb 12.8 oz (78.4 kg)  03/08/20 170 lb (77.1 kg)  07/12/19 194 lb 6.4 oz (88.2 kg)       History of Present Illness: Howard [name] is a 54 y.o. male who is being seen today for the evaluation of coronary artery calcification at the request of Assunta Found, MD.  Negative stress test many years ago.   He also feels a lot of chronic anxiety- in regards to his psoriatic arthritis.  Multiple joint surgeries in the past.   No family h/o early CAD.  Siblings without CAD.   Mother had MI at 70.   He has lost 90 lbs from 2019, when he got sober ( stopping beer helped).  H/o alcohol /opioid abuse- sober since 2019.  Still has not stopped smoking.    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   He had a CT scan in 9/21 for scoliosis:"Age advanced atherosclerotic calcifications involving the thoracic aorta and branch vessels including coronary artery calcifications."  He has had some occasional chest tightness feeling.  Occasionally related to exertion.  Still tries to go to gym, and feels well there some days.   Got his COVID vaccines.    Denies : Dizziness. Leg edema. Nitroglycerin use. Orthopnea. Palpitations. Paroxysmal nocturnal dyspnea. Shortness of breath. Syncope.      Past Medical History:  Diagnosis Date  . Alcohol abuse   . Anxiety   . Chronic back pain   . Chronic pain   . Depression   . Fall   . Fracture of right elbow   . Osteoarthritis   . Psoriasis     Past Surgical History:  Procedure Laterality Date  . APPENDECTOMY    . COLONOSCOPY WITH PROPOFOL N/A 06/07/2019   diverticulosis, otherwise normal. Non-bleeding internal hemorrhoids.   Marland Kitchen FOOT SURGERY     per patient: "neurofibroma excision"  . LUMBAR  LAMINECTOMY/DECOMPRESSION MICRODISCECTOMY Bilateral 12/31/2015   Procedure: LUMBER DECOMPRESSION L4-5/ L5-S1 BILATERALLY  ;  Surgeon: Jene Every, MD;  Location: [facility];  Service: Orthopedics;  Laterality: Bilateral;  . REVERSE SHOULDER ARTHROPLASTY Right 10/24/2018  . REVERSE SHOULDER ARTHROPLASTY Right 10/24/2018   Procedure: REVERSE SHOULDER ARTHROPLASTY;  Surgeon: Yolonda Kida, MD;  Location: [facility];  Service: Orthopedics;  Laterality: Right;  . SHOULDER SURGERY Right   . TOTAL HIP ARTHROPLASTY    . TOTAL HIP ARTHROPLASTY Left 08/27/2014   Procedure: LEFT TOTAL HIP ARTHROPLASTY ANTERIOR APPROACH;  Surgeon: Durene Romans, MD;  Location: [facility];  Service: Orthopedics;  Laterality: Left;     Current Outpatient Medications  Medication Sig Dispense Refill  . amphetamine-dextroamphetamine (ADDERALL) 20 MG tablet Take 20 mg by mouth 2 (two) times daily with a meal.     . busPIRone (BUSPAR) 30 MG tablet Take 30 mg by mouth 2 (two) times daily.    Marland Kitchen esomeprazole (NEXIUM) 40 MG capsule Take 40 mg by mouth daily as needed (acid reflux/indigestion.).     Marland Kitchen Guselkumab (TREMFYA) 100 MG/ML SOPN Tremfya 100 mg/mL subcutaneous auto-injector    . ibuprofen (ADVIL,MOTRIN) 800 MG tablet Take 800 mg by mouth every 8 (eight) hours as needed (pain).     Marland Kitchen levothyroxine (SYNTHROID, LEVOTHROID) 25 MCG tablet Take 25 mcg by  mouth daily before breakfast.     . methadone (DOLOPHINE) 10 MG/ML solution Take 20 mg by mouth daily.     . predniSONE (STERAPRED UNI-PAK 21 TAB) 10 MG (21) TBPK tablet Take by mouth daily. Take 6 tabs by mouth daily  for 1 days, then 5 tabs for 1 days, then 4 tabs for 1 days, then 3 tabs for 1 days, 2 tabs for 1 days, then 1 tab by mouth daily for 1 days 21 tablet 0  . metoprolol tartrate (LOPRESSOR) 100 MG tablet Take 1 tablet 2 hours prior to CT Scan 1 tablet 0  . rosuvastatin (CRESTOR) 10 MG tablet Take 1 tablet (10 mg total) by mouth daily. 90 tablet 3   No current  facility-administered medications for this visit.    Allergies:   Patient has no known allergies.    Social History:  The patient  reports that he has been smoking cigarettes. He has a 32.00 pack-year smoking history. He has never used smokeless tobacco. He reports previous alcohol use of about 16.0 standard drinks of alcohol per week. He reports that he does not use drugs.   Family History:  The patient's family history includes Heart attack in his mother.    ROS:  Please see the history of present illness.   Otherwise, review of systems are positive for joint pain.   All other systems are reviewed and negative.    PHYSICAL EXAM: VS:  BP 110/64   Pulse 72   Ht 6\' 5"  (1.956 m)   Wt 172 lb 12.8 oz (78.4 kg)   SpO2 96%   BMI 20.49 kg/m  , BMI Body mass index is 20.49 kg/m. GEN: Well nourished, well developed, in no acute distress  HEENT: normal  Neck: no JVD, carotid bruits, or masses Cardiac: RRR; no murmurs, rubs, or gallops,no edema  Respiratory:  clear to auscultation bilaterally, normal work of breathing GI: soft, nontender, nondistended, + BS MS: no deformity or atrophy  Skin: warm and dry, no rash Neuro:  Strength and sensation are intact Psych: euthymic mood, full affect   EKG:   The ekg ordered today demonstrates normal ECG   Recent Labs: 06/05/2019: BUN 17; Creatinine, Ser 0.50; Potassium 4.0; Sodium 135 02/01/2020: ALT 38   Lipid Panel    Component Value Date/Time   CHOL 186 03/27/2015 1008   TRIG 92 03/27/2015 1008   HDL 61 03/27/2015 1008   CHOLHDL 3.0 03/27/2015 1008   VLDL 18 03/27/2015 1008   LDLCALC 107 03/27/2015 1008     Other studies Reviewed: Additional studies/ records that were reviewed today with results demonstrating: LDL 71 in 2/21.     ASSESSMENT AND PLAN:  1. Coronary artery calcification: Has some tightness in the chest.  Gets HR to 125 bpm for 15-20 minutes.  Rosuvastatin 10 mg daily for its anti-inflammatory effect.  His arthritis  and tobacco are two RF for CAD.  Will try to minimize inflammation in his body.  Metoprolol 100 mg x 1 before CTA.  2. Hypothyroid: The current medical regimen is effective;  continue present plan and medications. 3. Tobacco abuse: We discussed smoking cessation.  He wants to do it on his own.  4. Anxiety: He points to anxiety as a big issue in his life.  Working with PMD on controlled anxiety.     Current medicines are reviewed at length with the patient today.  The patient concerns regarding his medicines were addressed.  The following changes have been made:  No change  Labs/ tests ordered today include:   Orders Placed This Encounter  Procedures  . CT CORONARY MORPH W/CTA COR W/SCORE W/CA W/CM &/OR WO/CM  . CT CORONARY FRACTIONAL FLOW RESERVE DATA PREP  . CT CORONARY FRACTIONAL FLOW RESERVE FLUID ANALYSIS  . Basic metabolic panel  . Hepatic function panel  . Lipid panel  . EKG 12-Lead    Recommend 150 minutes/week of aerobic exercise Low fat, low carb, high fiber diet recommended  Disposition:   FU based on CT results.   Signed, Lance Muss, MD  03/19/2020 10:58 AM    [facility] [address] [place], [place], [place]  [address] Phone: [phone]; Fax: [phone]

## 2020-04-01 ENCOUNTER — Telehealth (HOSPITAL_COMMUNITY): Payer: Self-pay | Admitting: Emergency Medicine

## 2020-04-01 NOTE — Telephone Encounter (Signed)
Reaching out to patient to offer assistance regarding upcoming cardiac imaging study; pt verbalizes understanding of appt date/time, parking situation and where to check in, pre-test NPO status and medications ordered, and verified current allergies; name and call back number provided for further questions should they arise Dayshon Roback RN Navigator Cardiac Imaging [facility] [phone] office [phone] cell  Pt to take 100mg metoprolol 2 hr prior to scan 

## 2020-04-02 ENCOUNTER — Other Ambulatory Visit: Payer: Self-pay

## 2020-04-02 ENCOUNTER — Ambulatory Visit (HOSPITAL_COMMUNITY)
Admission: RE | Admit: 2020-04-02 | Discharge: 2020-04-02 | Disposition: A | Discharge status: Home / Self Care | Payer: 59 | Source: Ambulatory Visit | Attending: Interventional Cardiology | Admitting: Interventional Cardiology

## 2020-04-02 ENCOUNTER — Encounter (HOSPITAL_COMMUNITY): Payer: Self-pay

## 2020-04-02 DIAGNOSIS — R072 Precordial pain: Secondary | ICD-10-CM

## 2020-04-02 MED ORDER — SODIUM CHLORIDE 0.9 % IV SOLN: Freq: Once | INTRAVENOUS | Status: DC

## 2020-04-02 MED ORDER — NITROGLYCERIN 0.4 MG SL SUBL: 0.8000 mg | SUBLINGUAL_TABLET | Freq: Once | SUBLINGUAL | Status: DC

## 2020-04-02 MED ORDER — METOPROLOL TARTRATE 5 MG/5ML IV SOLN: 5.0000 mg | INTRAVENOUS | Status: DC | PRN

## 2020-04-02 NOTE — Progress Notes (Signed)
After 1L NS Bolus IV, pt was still hypotensive (see flowsheets) Phone call to Dr Servando Salina who advised to cancel this exam and have pt reschedule another time if he wishes to. Pt was very sleepy while preparing for test. He was appreciative of our efforts to do the scan. I advised him that I will inform ordering MD of these events. IV DC, pt discharged home via ambulation to main entrance.

## 2020-04-14 ENCOUNTER — Telehealth (HOSPITAL_COMMUNITY): Payer: Self-pay | Admitting: Emergency Medicine

## 2020-04-14 NOTE — Telephone Encounter (Signed)
Attempted to call patient regarding upcoming cardiac CT appointment. Left message on voicemail with name and callback number Rockwell Alexandria RN Navigator Cardiac Imaging [facility] [phone] Office [phone] Cell  NO PO meds , IV only since last attempt became hypotensive

## 2020-04-14 NOTE — Telephone Encounter (Signed)
Pt returning phone call regarding upcoming cardiac imaging study; pt verbalizes understanding of appt date/time, parking situation and where to check in, pre-test NPO status and medications ordered, and verified current allergies; name and call back number provided for further questions should they arise Rockwell Alexandria RN Navigator Cardiac Imaging [facility] [phone] office [phone] cell   Pt informed that he will not take PO metoprolol prior to appt since last time he got hypotensive. That he would instead receive IV medications for rate control.  Pt verbalized understanding. Huntley Dec

## 2020-04-15 ENCOUNTER — Other Ambulatory Visit: Payer: Self-pay

## 2020-04-15 ENCOUNTER — Ambulatory Visit (HOSPITAL_COMMUNITY)
Admission: RE | Admit: 2020-04-15 | Discharge: 2020-04-15 | Disposition: A | Discharge status: Home / Self Care | Payer: 59 | Source: Ambulatory Visit | Attending: Interventional Cardiology | Admitting: Interventional Cardiology

## 2020-04-15 DIAGNOSIS — R072 Precordial pain: Secondary | ICD-10-CM | POA: Insufficient documentation

## 2020-04-15 DIAGNOSIS — Z006 Encounter for examination for normal comparison and control in clinical research program: Secondary | ICD-10-CM

## 2020-04-15 MED ORDER — NITROGLYCERIN 0.4 MG SL SUBL
SUBLINGUAL_TABLET | SUBLINGUAL | Status: AC
  Filled 2020-04-15: qty 1

## 2020-04-15 MED ORDER — IOHEXOL 350 MG/ML SOLN
80.0000 mL | Freq: Once | INTRAVENOUS | Status: AC | PRN
  Administered 2020-04-15: 80 mL via INTRAVENOUS

## 2020-04-15 MED ORDER — NITROGLYCERIN 0.4 MG SL SUBL
0.4000 mg | SUBLINGUAL_TABLET | Freq: Once | SUBLINGUAL | Status: AC
  Administered 2020-04-15: 0.4 mg via SUBLINGUAL

## 2020-04-15 NOTE — Research (Signed)
Informed Consent   Subject Name: Howard [name]  Subject met inclusion and exclusion criteria.  The informed consent form, study requirements and expectations were reviewed with the subject and questions and concerns were addressed prior to the signing of the consent form.  The subject verbalized understanding of the trial requirements.  The subject agreed to participate in the IDENTIFY trial and signed the informed consent at 1117 on 15-Apr-2020  The informed consent was obtained prior to performance of any protocol-specific procedures for the subject.  A copy of the signed informed consent was given to the subject and a copy was placed in the subject's medical record.   Sherlyn Lees

## 2020-04-17 ENCOUNTER — Other Ambulatory Visit: Payer: Self-pay

## 2020-04-17 DIAGNOSIS — I77810 Thoracic aortic ectasia: Secondary | ICD-10-CM

## 2020-05-06 ENCOUNTER — Other Ambulatory Visit: Payer: Self-pay

## 2020-05-06 ENCOUNTER — Ambulatory Visit (INDEPENDENT_AMBULATORY_CARE_PROVIDER_SITE_OTHER): Payer: 59 | Admitting: Podiatry

## 2020-05-06 DIAGNOSIS — S99922A Unspecified injury of left foot, initial encounter: Secondary | ICD-10-CM

## 2020-05-06 DIAGNOSIS — M2042 Other hammer toe(s) (acquired), left foot: Secondary | ICD-10-CM | POA: Diagnosis not present

## 2020-05-06 NOTE — Progress Notes (Signed)
He presents today for a chief complaint of painful forefoot left.  He states that he has a history of psoriatic arthritis and these toes it is getting worse and worse and worse.  He healed very well from the fibroma that we removed on plantar aspect of the left foot several years ago however is states that his toes are getting worse and he would like to have these fixed if possible.  Objective: Vital signs are stable he is alert oriented x3.  Pulses are palpable.  Severe hammertoe deformities bilateral do not appear to be psoriatic necessarily in nature.  Medial deviation of the second and third toes with dorsal dislocation of the second toe consistent with a rupture of the plantar plate.  Assessment: Probable plantar plate rupture with severe hammertoe deformities.  Plan: At this point in order to consider surgical intervention I feel that it would be best that we get an MRI to evaluate capsular integrity and for other differential diagnoses.

## 2020-05-13 ENCOUNTER — Other Ambulatory Visit (HOSPITAL_COMMUNITY): Payer: 59

## 2020-05-14 ENCOUNTER — Other Ambulatory Visit: Payer: Self-pay

## 2020-05-14 ENCOUNTER — Ambulatory Visit (HOSPITAL_COMMUNITY): Payer: 59 | Attending: Internal Medicine

## 2020-05-14 DIAGNOSIS — I77810 Thoracic aortic ectasia: Secondary | ICD-10-CM | POA: Insufficient documentation

## 2020-05-14 LAB — ECHOCARDIOGRAM COMPLETE
Area-P 1/2: 3.48 cm2
P 1/2 time: 527 msec
S' Lateral: 3 cm

## 2020-05-22 ENCOUNTER — Ambulatory Visit
Admission: RE | Admit: 2020-05-22 | Discharge: 2020-05-22 | Disposition: A | Discharge status: Home / Self Care | Payer: 59 | Source: Ambulatory Visit | Attending: Podiatry | Admitting: Podiatry

## 2020-05-22 ENCOUNTER — Other Ambulatory Visit: Payer: Self-pay

## 2020-05-22 DIAGNOSIS — S99922A Unspecified injury of left foot, initial encounter: Secondary | ICD-10-CM

## 2020-05-22 DIAGNOSIS — M2042 Other hammer toe(s) (acquired), left foot: Secondary | ICD-10-CM

## 2020-05-29 ENCOUNTER — Telehealth: Payer: Self-pay

## 2020-05-29 NOTE — Telephone Encounter (Signed)
-----   Message from Elinor Parkinson, [place] sent at 05/22/2020  4:37 PM EST ----- Please request MRI and over read due to pain on bottom of MTPJ  concerned about capsulitis or tear of plantar plate. Inform patient of the delay.

## 2020-05-29 NOTE — Telephone Encounter (Signed)
Request has been faxed to [facility] for MRI disc and report.  Patient has been notified of delay

## 2020-06-05 ENCOUNTER — Ambulatory Visit: Payer: 59 | Admitting: Podiatry

## 2020-06-06 ENCOUNTER — Telehealth: Payer: Self-pay

## 2020-06-06 DIAGNOSIS — I712 Thoracic aortic aneurysm, without rupture, unspecified: Secondary | ICD-10-CM

## 2020-06-06 NOTE — Telephone Encounter (Signed)
The patient has been notified of the result and verbalized understanding.  All questions (if any) were answered. Chest ct ordered for 6 months Howard [name], [place] 06/06/2020 5:01 PM

## 2020-06-06 NOTE — Telephone Encounter (Signed)
-----   Message from Corky Crafts, MD sent at 05/16/2020 11:05 PM EST ----- Normal LV function.  Dilated aorta.  Bicuspid aortic valve.  Will need f/u of aorta size.  Plan for CTA chest to eval aorta in 6 months.

## 2020-06-13 ENCOUNTER — Telehealth: Payer: Self-pay

## 2020-06-13 NOTE — Telephone Encounter (Signed)
Mailed disc to [facility] 

## 2020-06-13 NOTE — Telephone Encounter (Signed)
-----   Message from Max T Hyatt, DPM sent at 05/22/2020  4:37 PM EST ----- Please request MRI and over read due to pain on bottom of MTPJ  concerned about capsulitis or tear of plantar plate. Inform patient of the delay. 

## 2020-06-18 ENCOUNTER — Encounter: Payer: Self-pay | Admitting: *Deleted

## 2020-06-18 ENCOUNTER — Other Ambulatory Visit: Payer: Self-pay | Admitting: *Deleted

## 2020-06-18 ENCOUNTER — Encounter: Payer: Self-pay | Admitting: Internal Medicine

## 2020-06-18 DIAGNOSIS — K760 Fatty (change of) liver, not elsewhere classified: Secondary | ICD-10-CM

## 2020-06-25 ENCOUNTER — Other Ambulatory Visit: Payer: 59

## 2020-07-02 ENCOUNTER — Ambulatory Visit: Payer: 59 | Admitting: Podiatry

## 2020-07-03 ENCOUNTER — Ambulatory Visit: Payer: 59 | Admitting: Podiatry

## 2020-07-03 ENCOUNTER — Other Ambulatory Visit: Payer: Self-pay

## 2020-07-03 ENCOUNTER — Encounter: Payer: Self-pay | Admitting: Podiatry

## 2020-07-03 DIAGNOSIS — M7742 Metatarsalgia, left foot: Secondary | ICD-10-CM

## 2020-07-03 DIAGNOSIS — M2042 Other hammer toe(s) (acquired), left foot: Secondary | ICD-10-CM | POA: Diagnosis not present

## 2020-07-03 DIAGNOSIS — M19072 Primary osteoarthritis, left ankle and foot: Secondary | ICD-10-CM

## 2020-07-03 DIAGNOSIS — M778 Other enthesopathies, not elsewhere classified: Secondary | ICD-10-CM | POA: Diagnosis not present

## 2020-07-06 NOTE — Progress Notes (Signed)
He presents today for follow-up of his left foot pain. He states that is just getting worse and is starting to get to the point where he can no longer perform his daily activities. States that it is affecting his ability to perform his work family functions.  Objective: Vital signs are stable he is alert and oriented x3. Pulses are palpable. Severe medial dislocation of the lesser metatarsophalangeal joints severe hammertoe deformities. MRI and overreading both suggest severe osteoarthritic changes of the distal foot and midfoot. I spoke personally to Dr. Katrinka Blazing this morning he does not see any tears of the plantar plate. However the lateral collateral ligaments are inconclusive for any stretching or tearing.  Assessment: Severe hammertoe deformities.  Plan: At this point were going to get him with Dr. Lilian Kapur to see about surgical evaluation of these toes in his forefoot with [place] medial deviation. He will see him for surgery consult.

## 2020-07-22 ENCOUNTER — Ambulatory Visit: Payer: 59 | Admitting: Podiatry

## 2020-07-22 ENCOUNTER — Other Ambulatory Visit: Payer: Self-pay

## 2020-07-22 DIAGNOSIS — M7742 Metatarsalgia, left foot: Secondary | ICD-10-CM | POA: Diagnosis not present

## 2020-07-22 DIAGNOSIS — Q66222 Congenital metatarsus adductus, left foot: Secondary | ICD-10-CM | POA: Diagnosis not present

## 2020-07-22 DIAGNOSIS — S99922A Unspecified injury of left foot, initial encounter: Secondary | ICD-10-CM

## 2020-07-22 DIAGNOSIS — M2042 Other hammer toe(s) (acquired), left foot: Secondary | ICD-10-CM | POA: Diagnosis not present

## 2020-07-22 DIAGNOSIS — M21622 Bunionette of left foot: Secondary | ICD-10-CM | POA: Diagnosis not present

## 2020-07-23 NOTE — Progress Notes (Signed)
Subjective:  Patient ID: Howard [name], male    DOB: [birth year],  MRN: [id number]  Chief Complaint  Patient presents with  . Hammer Toe    Surgery consult for hammer toe of left     55 y.o. male presents with the above complaint. History confirmed with patient.  He is referred to me by Dr. Al Corpus for surgical consult for painful deviated toes on the left foot.  Is been present for some time.  The cause quite a bit of discomfort for him especially in shoes.  Mostly hurts on the tip of the toes.  He recently completed an MRI.  Objective:  Physical Exam: warm, good capillary refill, no trophic changes or ulcerative lesions, normal DP and PT pulses and normal sensory exam. Left Foot: He has significant and severe semirigid hammertoe contractures with adduction deformity at the MTPJ and within the toe itself.  Dorsal second followed by third and fourth toes.  Painful dorsal PIPJ abrasions.  Tailor's bunion  Radiographs: X-ray of the left foot: Mild to moderate metatarsus adductus, flexion and adduction deformities of the digits of the MTPJ left foot 2 through 4  Study Result  Narrative & Impression  CLINICAL DATA:  Chronic left second and third toe pain.  EXAM: MRI OF THE LEFT FOOT WITHOUT CONTRAST  TECHNIQUE: Multiplanar, multisequence MR imaging of the left forefoot was performed. No intravenous contrast was administered.  COMPARISON:  Left foot x-rays dated December 26, 2018. MRI left forefoot dated July 08, 2018.  FINDINGS: Bones/Joint/Cartilage  No suspicious marrow signal abnormality. No fracture or dislocation. Similar appearing osteoarthritis of the midfoot, first MTP joint, and first IP joint. Flexion deformities of the second and third PIP joints. No joint effusion.  Ligaments  Collateral ligaments are intact.  Plantar plates are intact.  Muscles and Tendons Flexor and extensor tendons are intact. No muscle edema or atrophy.  Soft tissue No fluid  collection or hematoma. No soft tissue mass. Previously seen plantar fibromas at the level of the proximal first and second metatarsals are no longer identified. There is residual postsurgical thickening of the plantar fascia in these areas.  IMPRESSION: 1. Second and third hammertoe deformities. 2. Similar appearing osteoarthritis of the midfoot and great toe.   Electronically Signed   By: Obie Dredge M.D.   On: 05/22/2020 16:01    Assessment:   1. Hammer toe of left foot   2. Metatarsalgia of left foot   3. Injury of plantar plate of left foot, initial encounter   4. Metatarsus adductus of left foot   5. Tailor's bunion of left foot      Plan:  Patient was evaluated and treated and all questions answered.  Reviewed the radiographic and MRI findings with patient in detail.  I discussed surgical considerations that could address his deformity now that he has failed nonoperative treatment.  Surgically we discussed the following procedures: Multiple metatarsal osteotomies 2 through 4, tailor's bunion correction, phalangeal osteotomies and hammertoe correction with PIPJ arthrodesis and pinning across the MTPJ 2 through 4, adductovarus correction fifth toe.  Discussed the risk, benefits and potential complications of the surgery as well as the expected postoperative course and weightbearing restrictions.  Currently he is a pack per day smoker.  I discussed with him that this would be a significant risk for surgery including risk of infection as well as delayed or nonunion.  Would like him to quit smoking prior to surgery he thinks he can do this.  He will  return to see me in 2 months for follow-up, he likely will plan for surgery at the end of summer.  No follow-ups on file.

## 2020-08-05 LAB — HEPATIC FUNCTION PANEL
AG Ratio: 2.9 (calc) — ABNORMAL HIGH (ref 1.0–2.5)
ALT: 44 U/L (ref 9–46)
AST: 39 U/L — ABNORMAL HIGH (ref 10–35)
Albumin: 4.1 g/dL (ref 3.6–5.1)
Alkaline phosphatase (APISO): 105 U/L (ref 35–144)
Bilirubin, Direct: 0.1 mg/dL (ref 0.0–0.2)
Globulin: 1.4 g/dL (calc) — ABNORMAL LOW (ref 1.9–3.7)
Indirect Bilirubin: 0.4 mg/dL (calc) (ref 0.2–1.2)
Total Bilirubin: 0.5 mg/dL (ref 0.2–1.2)
Total Protein: 5.5 g/dL — ABNORMAL LOW (ref 6.1–8.1)

## 2020-08-12 ENCOUNTER — Encounter: Payer: Self-pay | Admitting: Internal Medicine

## 2020-08-21 ENCOUNTER — Telehealth: Payer: Self-pay | Admitting: *Deleted

## 2020-08-21 DIAGNOSIS — Z006 Encounter for examination for normal comparison and control in clinical research program: Secondary | ICD-10-CM

## 2020-08-21 NOTE — Telephone Encounter (Signed)
I called patient for 90-day phone call for Identify Study. Patient is doing well with no cardiac symptoms. I reminded patient I would call him in Nov.for 1 year phone call.

## 2020-09-09 ENCOUNTER — Ambulatory Visit: Payer: 59 | Admitting: Podiatry

## 2020-10-07 ENCOUNTER — Other Ambulatory Visit: Payer: Self-pay | Admitting: *Deleted

## 2020-10-07 ENCOUNTER — Encounter: Payer: Self-pay | Admitting: Internal Medicine

## 2020-10-07 ENCOUNTER — Ambulatory Visit: Payer: 59 | Admitting: Internal Medicine

## 2020-10-07 VITALS — BP 110/61 | HR 67 | Temp 97.3°F | Ht 71.0 in | Wt 170.8 lb

## 2020-10-07 DIAGNOSIS — F1011 Alcohol abuse, in remission: Secondary | ICD-10-CM | POA: Diagnosis not present

## 2020-10-07 DIAGNOSIS — K760 Fatty (change of) liver, not elsewhere classified: Secondary | ICD-10-CM

## 2020-10-07 DIAGNOSIS — R7989 Other specified abnormal findings of blood chemistry: Secondary | ICD-10-CM | POA: Diagnosis not present

## 2020-10-07 DIAGNOSIS — K838 Other specified diseases of biliary tract: Secondary | ICD-10-CM

## 2020-10-07 NOTE — Progress Notes (Signed)
Primary Care Physician:  Elfredia Nevins, MD Primary Gastroenterologist:  Dr. Jena Gauss  Pre-Procedure History & Physical: HPI:  Howard [name] is a 55 y.o. male here for for follow-up of mildly elevated aminotransferases, echogenic liver on ultrasound consistent with fatty infiltration and dilated bile duct.  Patient states he has done well clinically not having any abdominal pain, GERD or change in bowel habits.  He is lost another 21 pounds since he was seen here last fall.  No alcohol in 3 years.  No longer taking opioids.  He is taking methadone.  Has been on methotrexate for psoriatic arthritis but that dose was recently decreased.  Hepatic function profile 2 months ago revealed a normal bilirubin of 0.5 alkaline phosphatase 105 AST 39 ALT 44; previously hepatic function profile 8 months ago was normal except for AST of 38.  CBD dilated 11 mm-stable.  Gallbladder remains in situ.  He takes NSAIDs every day.  At times, upwards of ibuprofen 800 mg 3 times daily.  Is not take esomeprazole regularly.  Past Medical History:  Diagnosis Date  . Alcohol abuse   . Anxiety   . Chronic back pain   . Chronic pain   . Depression   . Fall   . Fracture of right elbow   . Osteoarthritis   . Psoriasis     Past Surgical History:  Procedure Laterality Date  . APPENDECTOMY    . COLONOSCOPY WITH PROPOFOL N/A 06/07/2019   diverticulosis, otherwise normal. Non-bleeding internal hemorrhoids.   Marland Kitchen FOOT SURGERY     per patient: "neurofibroma excision"  . LUMBAR LAMINECTOMY/DECOMPRESSION MICRODISCECTOMY Bilateral 12/31/2015   Procedure: LUMBER DECOMPRESSION L4-5/ L5-S1 BILATERALLY  ;  Surgeon: Jene Every, MD;  Location: [facility];  Service: Orthopedics;  Laterality: Bilateral;  . REVERSE SHOULDER ARTHROPLASTY Right 10/24/2018  . REVERSE SHOULDER ARTHROPLASTY Right 10/24/2018   Procedure: REVERSE SHOULDER ARTHROPLASTY;  Surgeon: Yolonda Kida, MD;  Location[facility];  Service: Orthopedics;  Laterality:  Right;  . SHOULDER SURGERY Right   . TOTAL HIP ARTHROPLASTY    . TOTAL HIP ARTHROPLASTY Left 08/27/2014   Procedure: LEFT TOTAL HIP ARTHROPLASTY ANTERIOR APPROACH;  Surgeon: Durene Romans, MD;  Location: [facility];  Service: Orthopedics;  Laterality: Left;    Prior to Admission medications   Medication Sig Start Date End Date Taking? Authorizing Provider  amphetamine-dextroamphetamine (ADDERALL) 20 MG tablet Take 10 mg by mouth 2 (two) times daily with a meal. 07/14/18  Yes [provider]  esomeprazole (NEXIUM) 40 MG capsule Take 40 mg by mouth daily as needed (acid reflux/indigestion.).  06/26/18  Yes [provider]  Golimumab (SIMPONI ARIA IV) Inject into the vein every 2 (two) months.   Yes [provider]  ibuprofen (ADVIL,MOTRIN) 800 MG tablet Take 800 mg by mouth every 8 (eight) hours as needed (pain).    Yes [provider]  levothyroxine (SYNTHROID, LEVOTHROID) 25 MCG tablet Take 25 mcg by mouth daily before breakfast.  06/26/18  Yes [provider]    Allergies as of 10/07/2020  . (No Known Allergies)    Family History  Problem Relation Age of Onset  . Heart attack Mother   . Colon cancer Neg Hx     Social History   Socioeconomic History  . Marital status: Married    Spouse name: Not on file  . Number of children: Not on file  . Years of education: Not on file  . Highest education level: Not on file  Occupational  History  . Not on file  Tobacco Use  . Smoking status: Current Every Day Smoker    Packs/day: 1.00    Years: 32.00    Pack years: 32.00    Types: Cigarettes  . Smokeless tobacco: Never Used  Vaping Use  . Vaping Use: Never used  Substance and Sexual Activity  . Alcohol use: Not Currently    Alcohol/week: 16.0 standard drinks    Types: 6 Glasses of wine, 10 Cans of beer per week    Comment: None currently (last ETOH 03/01/2018); previously drinks 3 to 4 beers daily and 1 glass of wine; "way more" on the weekend   . Drug use: No    Comment: hx of marijuana use years ago   . Sexual activity: Not on file  Other Topics Concern  . Not on file  Social History Narrative  . Not on file   Social Determinants of Health   Financial Resource Strain: Not on file  Food Insecurity: Not on file  Transportation Needs: Not on file  Physical Activity: Not on file  Stress: Not on file  Social Connections: Not on file  Intimate Partner Violence: Not on file    Review of Systems: See HPI, otherwise negative ROS  Physical Exam: BP 110/61   Pulse 67   Temp (!) 97.3 F (36.3 C) (Temporal)   Ht 5\' 11"  (1.803 m)   Wt 170 lb 12.8 oz (77.5 kg)   BMI 23.82 kg/m  General:   Alert,   pleasant and cooperative in NAD.  No cutaneous stigmata of chronic liver disease. Neck:  Supple; no masses or thyromegaly. No significant cervical adenopathy. Lungs:  Clear throughout to auscultation.   No wheezes, crackles, or rhonchi. No acute distress. Heart:  Regular rate and rhythm; no murmurs, clicks, rubs,  or gallops. Abdomen: Non-distended, normal bowel sounds.  Soft and nontender without appreciable mass or hepatosplenomegaly.  Pulses:  Normal pulses noted. Extremities:  Without clubbing or edema.  Impression/Plan: Pleasant 55 year old gentleman who is been able to accomplish significant weight loss with cessation of alcohol and opioid painkillers.  Clinically, he is doing well without any GI symptoms.  He is on pretty hefty doses of NSAIDs on a regular basis without regular cytoprotective medication.  Clinically, I feel he has a very well preserved hepatic synthetic function.  It is unlikely his advanced chronic liver disease.  Minimal bump in AST is nonspecific could be related to medication effect (methotrexate, NSAIDs, etc.) and/or resolving alcohol related fatty liver disease.  I understand methotrexate dosing was reduced recently.  Chronically dilated bile duct  - likely stigmata of chronic narcotic  therapy.   Recommendations:  Patient was commended on cessation of alcohol and opioid painkillers!  With continued regular NSAID use, I recommend taking esomeprazole 40 mg once daily as site of protection.  Liver enzymes were almost normal 2 months ago.  I do not recommend further liver evaluation at this time beyond continuing to follow liver enzymes.  Lets repeat a hepatic profile in 4 months  Continue to maintain a healthy weight.  Office visit with me in 6 months   Notice: This dictation was prepared with Dragon dictation along with smaller phrase technology. Any transcriptional errors that result from this process are unintentional and may not be corrected upon review.

## 2020-10-07 NOTE — Patient Instructions (Signed)
Outstanding accomplishment on cessation of alcohol and opioid painkillers!  If you are continuing on ibuprofen, meloxicam or Naprosyn, I recommend you take esomeprazole 40 mg once daily to protect your stomach  Your liver enzymes were almost normal 2 months ago.  Because you are having no symptoms, I do not recommend further evaluation of your liver at this time beyond continuing to follow your liver blood work  Lets repeat a hepatic profile in 4 months  Let me also congratulate you on weight loss!  Office visit with me in 6 months

## 2020-10-16 ENCOUNTER — Ambulatory Visit: Payer: 59 | Admitting: Podiatry

## 2020-11-12 ENCOUNTER — Inpatient Hospital Stay: Admission: RE | Admit: 2020-11-12 | Payer: 59 | Source: Ambulatory Visit

## 2020-11-18 ENCOUNTER — Other Ambulatory Visit: Payer: Self-pay

## 2020-11-18 ENCOUNTER — Ambulatory Visit: Payer: 59 | Admitting: Podiatry

## 2020-11-18 DIAGNOSIS — M2042 Other hammer toe(s) (acquired), left foot: Secondary | ICD-10-CM | POA: Diagnosis not present

## 2020-11-18 DIAGNOSIS — M21622 Bunionette of left foot: Secondary | ICD-10-CM

## 2020-11-18 DIAGNOSIS — M7742 Metatarsalgia, left foot: Secondary | ICD-10-CM | POA: Diagnosis not present

## 2020-11-18 DIAGNOSIS — Q66222 Congenital metatarsus adductus, left foot: Secondary | ICD-10-CM

## 2020-11-22 ENCOUNTER — Encounter: Payer: Self-pay | Admitting: Podiatry

## 2020-11-22 NOTE — Progress Notes (Signed)
  Subjective:  Patient ID: Howard [name], male    DOB: Jan 05, 1966,  MRN: [id number]  Chief Complaint  Patient presents with   Howard [name]      7 week follow up    55 y.o. male returns for follow-up with the above complaint. History confirmed with patient.  He was able to quit smoking and is on Chantix.  He is taking Symphony Aria for psoriatic arthritis.  He is interested in planning for surgery in mid-September  Objective:  Physical Exam: warm, good capillary refill, no trophic changes or ulcerative lesions, normal DP and PT pulses and normal sensory exam. Left Foot: He has significant and severe semirigid hammertoe contractures with adduction deformity at the MTPJ and within the [name] itself.  Dorsal second followed by third and fourth toes.  Painful dorsal PIPJ abrasions.  Tailor's bunion  Radiographs: X-ray of the left foot: Mild to moderate metatarsus adductus, flexion and adduction deformities of the digits of the MTPJ left foot 2 through 4  Study Result  Narrative & Impression  CLINICAL DATA:  Chronic left second and third [name] pain.   EXAM: MRI OF THE LEFT FOOT WITHOUT CONTRAST   TECHNIQUE: Multiplanar, multisequence MR imaging of the left forefoot was performed. No intravenous contrast was administered.   COMPARISON:  Left foot x-rays dated December 26, 2018. MRI left forefoot dated July 08, 2018.   FINDINGS: Bones/Joint/Cartilage   No suspicious marrow signal abnormality. No fracture or dislocation. Similar appearing osteoarthritis of the midfoot, first MTP joint, and first IP joint. Flexion deformities of the second and third PIP joints. No joint effusion.   Ligaments   Collateral ligaments are intact.  Plantar plates are intact.   Muscles and Tendons Flexor and extensor tendons are intact. No muscle edema or atrophy.   Soft tissue No fluid collection or hematoma. No soft tissue mass. Previously seen plantar fibromas at the level of the proximal first and  second metatarsals are no longer identified. There is residual postsurgical thickening of the plantar fascia in these areas.   IMPRESSION: 1. Second and third hammertoe deformities. 2. Similar appearing osteoarthritis of the midfoot and great [name].     Electronically Signed   By: Obie Dredge M.D.   On: 05/22/2020 16:01    Assessment:   1. Hammer [name] of left foot   2. Metatarsalgia of left foot   3. Metatarsus adductus of left foot   4. Tailor's bunion of left foot       Plan:  Patient was evaluated and treated and all questions answered.  we again discussed surgical correction.  He would like to plan for this for September 9.  Surgical plan will be as follows distal metatarsal metaphyseal osteotomies, PIPJ arthrodeses and tailor's bunion correction on the left foot.  He will continue his smoking cessation with Chantix.  I will see him back in mid August for signing of consent and final surgical preplanning visit.  Return in about 5 weeks (around 12/23/2020) for surgery planning visit left foot.

## 2020-12-23 ENCOUNTER — Other Ambulatory Visit: Payer: Self-pay

## 2020-12-23 DIAGNOSIS — K760 Fatty (change of) liver, not elsewhere classified: Secondary | ICD-10-CM

## 2020-12-23 DIAGNOSIS — F1011 Alcohol abuse, in remission: Secondary | ICD-10-CM

## 2020-12-24 ENCOUNTER — Other Ambulatory Visit: Payer: Self-pay

## 2020-12-24 ENCOUNTER — Ambulatory Visit (INDEPENDENT_AMBULATORY_CARE_PROVIDER_SITE_OTHER)
Admission: RE | Admit: 2020-12-24 | Discharge: 2020-12-24 | Disposition: A | Discharge status: Home / Self Care | Payer: 59 | Source: Ambulatory Visit | Attending: Interventional Cardiology | Admitting: Interventional Cardiology

## 2020-12-24 DIAGNOSIS — I712 Thoracic aortic aneurysm, without rupture, unspecified: Secondary | ICD-10-CM

## 2020-12-24 MED ORDER — IOHEXOL 350 MG/ML SOLN
100.0000 mL | Freq: Once | INTRAVENOUS | Status: AC | PRN
  Administered 2020-12-24: 100 mL via INTRAVENOUS

## 2020-12-25 ENCOUNTER — Ambulatory Visit: Payer: 59 | Admitting: Podiatry

## 2020-12-25 ENCOUNTER — Other Ambulatory Visit: Payer: Self-pay | Admitting: *Deleted

## 2020-12-25 DIAGNOSIS — Q66222 Congenital metatarsus adductus, left foot: Secondary | ICD-10-CM

## 2020-12-25 DIAGNOSIS — M7742 Metatarsalgia, left foot: Secondary | ICD-10-CM | POA: Diagnosis not present

## 2020-12-25 DIAGNOSIS — M2042 Other hammer toe(s) (acquired), left foot: Secondary | ICD-10-CM | POA: Diagnosis not present

## 2020-12-25 DIAGNOSIS — M21622 Bunionette of left foot: Secondary | ICD-10-CM

## 2020-12-25 DIAGNOSIS — I712 Thoracic aortic aneurysm, without rupture, unspecified: Secondary | ICD-10-CM

## 2020-12-29 ENCOUNTER — Encounter: Payer: Self-pay | Admitting: Podiatry

## 2020-12-29 NOTE — Progress Notes (Signed)
Subjective:  Patient ID: Howard [name], male    DOB: 1966-01-09,  MRN: [id number]  Chief Complaint  Patient presents with   [name]       SURGERY CONSULT    55 y.o. male returns for follow-up with the above complaint. History confirmed with patient.  Doing well he is still not smoking and has been able to quit successfully  Objective:  Physical Exam: warm, good capillary refill, no trophic changes or ulcerative lesions, normal DP and PT pulses and normal sensory exam. Left Foot: He has significant and severe semirigid hammertoe contractures with adduction deformity at the MTPJ and within the toe itself.  Dorsal second followed by third and fourth toes.  Painful dorsal PIPJ abrasions.  Tailor's bunion  Radiographs: X-ray of the left foot: Mild to moderate metatarsus adductus, flexion and adduction deformities of the digits of the MTPJ left foot 2 through 4  Study Result  Narrative & Impression  CLINICAL DATA:  Chronic left second and third toe pain.   EXAM: MRI OF THE LEFT FOOT WITHOUT CONTRAST   TECHNIQUE: Multiplanar, multisequence MR imaging of the left forefoot was performed. No intravenous contrast was administered.   COMPARISON:  Left foot x-rays dated December 26, 2018. MRI left forefoot dated July 08, 2018.   FINDINGS: Bones/Joint/Cartilage   No suspicious marrow signal abnormality. No fracture or dislocation. Similar appearing osteoarthritis of the midfoot, first MTP joint, and first IP joint. Flexion deformities of the second and third PIP joints. No joint effusion.   Ligaments   Collateral ligaments are intact.  Plantar plates are intact.   Muscles and Tendons Flexor and extensor tendons are intact. No muscle edema or atrophy.   Soft tissue No fluid collection or hematoma. No soft tissue mass. Previously seen plantar fibromas at the level of the proximal first and second metatarsals are no longer identified. There is residual  postsurgical thickening of the plantar fascia in these areas.   IMPRESSION: 1. Second and third hammertoe deformities. 2. Similar appearing osteoarthritis of the midfoot and great toe.     Electronically Signed   By: Obie Dredge M.D.   On: 05/22/2020 16:01    Assessment:   1. Hammer toe of left foot   2. Metatarsalgia of left foot   3. Metatarsus adductus of left foot   4. Tailor's bunion of left foot       Plan:  Patient was evaluated and treated and all questions answered.  We again discussed surgical correction.  He would like to plan for this for September 9.  Surgical plan will be as follows distal metatarsal metaphyseal osteotomies, PIPJ arthrodeses and tailor's bunion correction on the left foot.  He will continue his smoking cessation  which she has done well with.  We discussed all risk, benefits and potential complications including but not limited to pain, swelling, infection, scar, numbness which may be temporary or permanent, chronic pain, stiffness, nerve pain or damage, wound healing problems, bone healing problems including delayed or non-union.  Inform consent was signed today.  All questions were addressed.   Surgical plan:  Procedure: -Left foot distal metatarsal metaphyseal osteotomies 2, 3, 4 with hammertoe correction 2, 3, 4, 5 and tailor's bunion correction on the fifth metatarsal  Location: -[facility]  Anesthesia plan: -IV sedation with local field block  Postoperative pain plan: - Tylenol 1000 mg every 6 hours, ibuprofen 600 mg every 6 hours, gabapentin 300 mg every 8 hours x5 days, oxycodone 5 mg 1-2 tabs  every 6 hours only as needed  DVT prophylaxis: -None required  WB Restrictions / DME needs: -CAM boot which was dispensed today    No follow-ups on file.

## 2021-01-07 ENCOUNTER — Telehealth: Payer: Self-pay

## 2021-01-07 NOTE — Telephone Encounter (Signed)
ERROR

## 2021-01-16 ENCOUNTER — Telehealth: Payer: Self-pay | Admitting: Urology

## 2021-01-16 NOTE — Telephone Encounter (Signed)
DOS 01/23/21  METATARSAL OSTEO 2-5 LEFT --- 28308 HAMMERTOE REPAIR 2-5 LEFT --- 28285   [facility] EFFECTIVE DATE - 05/17/20   PLAN DEDUCTIBLE - $750.00 W/ $0.00 REMAINING OUT OF POCKET -  $3,000.00 W/ $58.83 REMAINING COINSURANCE - 20% COPAY - $0.00   SPOKE WITH JOYCE AND MAY WITH [employer] AND SHE STATED THAT FOR CPT CODES 70786 X'S 4 NO AUTH IS REQUIRED AND FOR CPT CODES 75449 X'S 4 AUTH IS REQUIRED, AUTH # [id number], GOOD FROM 01/23/21 - 04/23/21.  REF #[id number] REF # [id number]

## 2021-01-29 ENCOUNTER — Encounter: Payer: 59 | Admitting: Podiatry

## 2021-01-30 ENCOUNTER — Encounter: Payer: 59 | Admitting: Podiatry

## 2021-02-12 ENCOUNTER — Encounter: Payer: 59 | Admitting: Podiatry

## 2021-02-13 ENCOUNTER — Other Ambulatory Visit: Payer: Self-pay | Admitting: Podiatry

## 2021-02-13 DIAGNOSIS — M2042 Other hammer toe(s) (acquired), left foot: Secondary | ICD-10-CM | POA: Diagnosis not present

## 2021-02-13 DIAGNOSIS — M21542 Acquired clubfoot, left foot: Secondary | ICD-10-CM | POA: Diagnosis not present

## 2021-02-13 MED ORDER — GABAPENTIN 300 MG PO CAPS: 300.0000 mg | ORAL_CAPSULE | Freq: Three times a day (TID) | ORAL | 0 refills | Status: DC

## 2021-02-13 MED ORDER — ACETAMINOPHEN 500 MG PO TABS: 1000.0000 mg | ORAL_TABLET | Freq: Four times a day (QID) | ORAL | 0 refills | Status: AC | PRN

## 2021-02-13 MED ORDER — IBUPROFEN 600 MG PO TABS: 600.0000 mg | ORAL_TABLET | Freq: Four times a day (QID) | ORAL | 0 refills | Status: AC | PRN

## 2021-02-13 MED ORDER — OXYCODONE HCL 5 MG PO TABS: 5.0000 mg | ORAL_TABLET | ORAL | 0 refills | Status: DC | PRN

## 2021-02-16 ENCOUNTER — Telehealth: Payer: Self-pay | Admitting: *Deleted

## 2021-02-16 DIAGNOSIS — M7742 Metatarsalgia, left foot: Secondary | ICD-10-CM

## 2021-02-16 MED ORDER — OXYCODONE HCL 5 MG PO TABS: 5.0000 mg | ORAL_TABLET | ORAL | 0 refills | Status: AC | PRN

## 2021-02-16 NOTE — Telephone Encounter (Signed)
Returned the call to patient, informed that requested medication has been approved and sent to pharmacy on file. He verbalized understanding and said thank you.

## 2021-02-16 NOTE — Telephone Encounter (Signed)
Patient is calling to request a refill of his pain medicine one more time.(oxyCodone 5 mg) Please advise.

## 2021-02-19 ENCOUNTER — Ambulatory Visit (INDEPENDENT_AMBULATORY_CARE_PROVIDER_SITE_OTHER): Payer: 59 | Admitting: Podiatry

## 2021-02-19 ENCOUNTER — Other Ambulatory Visit: Payer: Self-pay

## 2021-02-19 ENCOUNTER — Ambulatory Visit (INDEPENDENT_AMBULATORY_CARE_PROVIDER_SITE_OTHER): Payer: 59

## 2021-02-19 DIAGNOSIS — M21622 Bunionette of left foot: Secondary | ICD-10-CM

## 2021-02-19 DIAGNOSIS — Q66222 Congenital metatarsus adductus, left foot: Secondary | ICD-10-CM

## 2021-02-19 DIAGNOSIS — M7742 Metatarsalgia, left foot: Secondary | ICD-10-CM

## 2021-02-19 DIAGNOSIS — M2042 Other hammer toe(s) (acquired), left foot: Secondary | ICD-10-CM

## 2021-02-19 MED ORDER — CEPHALEXIN 500 MG PO CAPS: 500.0000 mg | ORAL_CAPSULE | Freq: Three times a day (TID) | ORAL | 0 refills | Status: AC

## 2021-02-22 ENCOUNTER — Encounter: Payer: Self-pay | Admitting: Podiatry

## 2021-02-22 NOTE — Progress Notes (Signed)
  Subjective:  Patient ID: Howard [name], male    DOB: 08/22/1965,  MRN: [id number]  Chief Complaint  Patient presents with   Routine Post Op     (xray)POV #1 DOS 02/13/2021 METATARSAL OSTEOTOMY 2-5 LT, HAMMERTOE REPAIR 2-5 LT    55 y.o. male returns for post-op check.   Review of Systems: Negative except as noted in the HPI. Denies N/V/F/Ch.   Objective:  There were no vitals filed for this visit. There is no height or weight on file to calculate BMI. Constitutional Well developed. Well nourished.  Vascular Foot warm and well perfused. Capillary refill normal to all digits.   Neurologic Normal speech. Oriented to person, place, and time. Epicritic sensation to light touch grossly present bilaterally.  Dermatologic Skin healing well he does have some hemorrhagic blistering around the second and third toes, minor erythema around the incisions, no cellulitis no purulence, significant edema  Orthopedic: Tenderness to palpation noted about the surgical site.   Multiple view plain film radiographs: Status post metatarsal osteotomies with percutaneous pinning and hammertoe correction 2 through 5 Assessment:   1. Hammer toe of left foot   2. Metatarsalgia of left foot   3. Metatarsus adductus of left foot   4. Tailor's bunion of left foot    Plan:  Patient was evaluated and treated and all questions answered.  S/p foot surgery left -Progressing as expected post-operatively. -XR: As above no complication noted on his x-rays -WB Status: WBAT in CAM boot.  I advised him to rest and keep elevated and ice is much as possible and this will help reduce the swelling -Sutures: Removed at next visit. -Medications: I did put him on Keflex due to the erythema -Foot redressed.  Prepped the hemorrhagic blisters with Betadine and lanced them to allow them to drain, did not remove the roof.  Foot was redressed with sterile dressings.  Return in about 1 week (around 02/26/2021) for post op (no  x-rays).

## 2021-02-26 ENCOUNTER — Other Ambulatory Visit: Payer: Self-pay

## 2021-02-26 ENCOUNTER — Ambulatory Visit (INDEPENDENT_AMBULATORY_CARE_PROVIDER_SITE_OTHER): Payer: 59 | Admitting: Podiatry

## 2021-02-26 DIAGNOSIS — M21622 Bunionette of left foot: Secondary | ICD-10-CM

## 2021-02-26 DIAGNOSIS — M7742 Metatarsalgia, left foot: Secondary | ICD-10-CM

## 2021-02-26 DIAGNOSIS — Q66222 Congenital metatarsus adductus, left foot: Secondary | ICD-10-CM

## 2021-02-26 DIAGNOSIS — M2042 Other hammer toe(s) (acquired), left foot: Secondary | ICD-10-CM

## 2021-03-03 NOTE — Progress Notes (Signed)
  Subjective:  Patient ID: Howard [name], male    DOB: [birth year],  MRN: [id number]  Chief Complaint  Patient presents with   Routine Post Op     skin check and dressing change POV#2    55 y.o. male returns for post-op check.   Review of Systems: Negative except as noted in the HPI. Denies N/V/F/Ch.   Objective:  There were no vitals filed for this visit. There is no height or weight on file to calculate BMI. Constitutional Well developed. Well nourished.  Vascular Foot warm and well perfused. Capillary refill normal to all digits.   Neurologic Normal speech. Oriented to person, place, and time. Epicritic sensation to light touch grossly present bilaterally.  Dermatologic Blistering has resolved edema is improving skin's incisions are healing well  Orthopedic: Tenderness to palpation noted about the surgical site.   Multiple view plain film radiographs: Status post metatarsal osteotomies with percutaneous pinning and hammertoe correction 2 through 5 Assessment:   1. Hammer toe of left foot   2. Metatarsalgia of left foot   3. Metatarsus adductus of left foot   4. Tailor's bunion of left foot    Plan:  Patient was evaluated and treated and all questions answered.  S/p foot surgery left -Doing much better we will remove sutures at next visit, continue ice and elevation, WBAT in CAM boot advised to continue to rest as much as possible  Return in about 1 week (around 03/05/2021) for post op (no x-rays), suture removal.

## 2021-03-05 ENCOUNTER — Other Ambulatory Visit: Payer: Self-pay

## 2021-03-05 ENCOUNTER — Ambulatory Visit (INDEPENDENT_AMBULATORY_CARE_PROVIDER_SITE_OTHER): Payer: 59

## 2021-03-05 ENCOUNTER — Ambulatory Visit (INDEPENDENT_AMBULATORY_CARE_PROVIDER_SITE_OTHER): Payer: 59 | Admitting: Podiatry

## 2021-03-05 ENCOUNTER — Encounter: Payer: 59 | Admitting: Podiatry

## 2021-03-05 DIAGNOSIS — M2042 Other hammer toe(s) (acquired), left foot: Secondary | ICD-10-CM

## 2021-03-05 DIAGNOSIS — Z9889 Other specified postprocedural states: Secondary | ICD-10-CM

## 2021-03-05 NOTE — Progress Notes (Signed)
  Subjective:  Patient ID: Howard [name], male    DOB: 12-Sep-1965,  MRN: [id number]  Chief Complaint  Patient presents with   Routine Post Op    POV #3 DOS 02/13/2021 METATARSAL OSTEOTOMY 2-5 LT, HAMMERTOE REPAIR 2-5 LT    55 y.o. male returns for post-op check.   Review of Systems: Negative except as noted in the HPI. Denies N/V/F/Ch.   Objective:  There were no vitals filed for this visit. There is no height or weight on file to calculate BMI. Constitutional Well developed. Well nourished.  Vascular Foot warm and well perfused. Capillary refill normal to all digits.   Neurologic Normal speech. Oriented to person, place, and time. Epicritic sensation to light touch grossly present bilaterally.  Dermatologic Well-healed incisions  Orthopedic: Tenderness to palpation noted about the surgical site.   Multiple view plain film radiographs: Status post metatarsal osteotomies with percutaneous pinning and hammertoe correction 2 through 5 Assessment:   1. Hammer toe of left foot   2. Post-operative state    Plan:  Patient was evaluated and treated and all questions answered.  S/p foot surgery left -Doing well sutures removed today, continue to be WBAT in the CAM boot.  Plan for new x-rays and pin removal at next visit.  Possible transition to shoe gear if tolerated may need 2 more weeks walking in the CAM boot after pin removal.  Can begin bathing apply triple antibiotic ointment around pin sites prior to and after bathing  Return in about 3 weeks (around 03/26/2021) for post op (new x-rays); pin removal .

## 2021-03-14 ENCOUNTER — Other Ambulatory Visit: Payer: Self-pay | Admitting: Interventional Cardiology

## 2021-03-24 ENCOUNTER — Encounter: Payer: Self-pay | Admitting: Internal Medicine

## 2021-03-26 ENCOUNTER — Ambulatory Visit (INDEPENDENT_AMBULATORY_CARE_PROVIDER_SITE_OTHER): Payer: 59

## 2021-03-26 ENCOUNTER — Ambulatory Visit (INDEPENDENT_AMBULATORY_CARE_PROVIDER_SITE_OTHER): Payer: 59 | Admitting: Podiatry

## 2021-03-26 ENCOUNTER — Other Ambulatory Visit: Payer: Self-pay

## 2021-03-26 DIAGNOSIS — M2042 Other hammer toe(s) (acquired), left foot: Secondary | ICD-10-CM

## 2021-03-26 DIAGNOSIS — M21622 Bunionette of left foot: Secondary | ICD-10-CM

## 2021-03-26 DIAGNOSIS — Q66222 Congenital metatarsus adductus, left foot: Secondary | ICD-10-CM

## 2021-03-26 DIAGNOSIS — M7742 Metatarsalgia, left foot: Secondary | ICD-10-CM

## 2021-03-30 NOTE — Progress Notes (Signed)
  Subjective:  Patient ID: Howard [name], male    DOB: [birth year],  MRN: [id number]  Chief Complaint  Patient presents with   Routine Post Op      (xray)POV #4 DOS 02/13/2021 METATARSAL OSTEOTOMY 2-5 LT, HAMMERTOE REPAIR 2-5 LT    55 y.o. male returns for post-op check.   Review of Systems: Negative except as noted in the HPI. Denies N/V/F/Ch.   Objective:  There were no vitals filed for this visit. There is no height or weight on file to calculate BMI. Constitutional Well developed. Well nourished.  Vascular Foot warm and well perfused. Capillary refill normal to all digits.   Neurologic Normal speech. Oriented to person, place, and time. Epicritic sensation to light touch grossly present bilaterally.  Dermatologic Well-healed incisions  Orthopedic: Tenderness to palpation noted about the surgical site.   Multiple view plain film radiographs: Early bone callus and consolidation across the osteotomy and arthrodesis sites Assessment:   1. Hammer toe of left foot   2. Metatarsalgia of left foot   3. Metatarsus adductus of left foot   4. Tailor's bunion of left foot    Plan:  Patient was evaluated and treated and all questions answered.  S/p foot surgery left -Doing well Kirschner wires were removed today, he can transition back to regular shoe gear as tolerated avoid impact exercise for now.  Gradual increase in activity  Return in about 6 weeks (around 05/07/2021) for post op (new x-rays).

## 2021-05-07 ENCOUNTER — Ambulatory Visit (INDEPENDENT_AMBULATORY_CARE_PROVIDER_SITE_OTHER): Payer: 59

## 2021-05-07 ENCOUNTER — Other Ambulatory Visit: Payer: Self-pay

## 2021-05-07 ENCOUNTER — Ambulatory Visit (INDEPENDENT_AMBULATORY_CARE_PROVIDER_SITE_OTHER): Payer: 59 | Admitting: Podiatry

## 2021-05-07 DIAGNOSIS — Q66222 Congenital metatarsus adductus, left foot: Secondary | ICD-10-CM

## 2021-05-07 DIAGNOSIS — M2042 Other hammer toe(s) (acquired), left foot: Secondary | ICD-10-CM | POA: Diagnosis not present

## 2021-05-07 DIAGNOSIS — M7742 Metatarsalgia, left foot: Secondary | ICD-10-CM

## 2021-05-07 DIAGNOSIS — M21622 Bunionette of left foot: Secondary | ICD-10-CM

## 2021-05-07 NOTE — Progress Notes (Signed)
°  Subjective:  Patient ID: Howard [name], male    DOB: May 03, 1966,  MRN: [id number]  Chief Complaint  Patient presents with   Routine Post Op      (xray)POV #5 DOS 02/13/2021 METATARSAL OSTEOTOMY 2-5 LT, HAMMERTOE REPAIR 2-5 LT    55 y.o. male returns for post-op check.   Review of Systems: Negative except as noted in the HPI. Denies N/V/F/Ch.   Objective:  There were no vitals filed for this visit. There is no height or weight on file to calculate BMI. Constitutional Well developed. Well nourished.  Vascular Foot warm and well perfused. Capillary refill normal to all digits.   Neurologic Normal speech. Oriented to person, place, and time. Epicritic sensation to light touch grossly present bilaterally.  Dermatologic Well-healed incisions  Orthopedic: He has no tenderness to palpation noted about the surgical site.  Some limited range of motion of the toes and minimal edema   Multiple view plain film radiographs: Increasing bone callus and consolidation across the osteotomy and arthrodesis sites Assessment:   1. Hammer toe of left foot   2. Metatarsalgia of left foot   3. Metatarsus adductus of left foot   4. Tailor's bunion of left foot    Plan:  Patient was evaluated and treated and all questions answered.  S/p foot surgery left -Continue regular shoe gear and activity.  Avoid impact exercise until 6 months postop.  Return to see me as needed at this point  Return if symptoms worsen or fail to improve.

## 2021-06-02 NOTE — Progress Notes (Signed)
Cardiology Office Note   Date:  06/03/2021   ID:  Aram Candela Bowron, DOB 05-29-1965, MRN [id number]  PCP:  Elfredia Nevins, MD    Chief Complaint  Patient presents with   Follow-up   Coronary artery calcification  Wt Readings from Last 3 Encounters:  06/03/21 166 lb (75.3 kg)  10/07/20 170 lb 12.8 oz (77.5 kg)  03/19/20 172 lb 12.8 oz (78.4 kg)       History of Present Illness: Howard [name] is a 56 y.o. male   who is being seen today for the evaluation of coronary artery calcification at the request of Assunta Found, MD.  Negative stress test many years ago.   He also feels a lot of chronic anxiety- in regards to his psoriatic arthritis.  Multiple joint surgeries in the past.    No family h/o early CAD.  Siblings without CAD.   Mother had MI at 80.    He has lost 90 lbs from 2019-2021, when he got sober ( stopping beer helped).  H/o alcohol /opioid abuse.    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    He had a CT scan in 9/21 for scoliosis:"Age advanced atherosclerotic calcifications involving the thoracic aorta and branch vessels including coronary artery calcifications."  Had trouble quitting smoking.   He had foot surgery in 2022.  Back to exercising.  He is trying to quit smoking.  He is trying some pouches of nicotine.  Weaning himself off, and he thinks it is going well- down to 10 cigs.  Denies : Chest pain. Dizziness. Leg edema. Nitroglycerin use. Orthopnea. Palpitations. Paroxysmal nocturnal dyspnea. Shortness of breath. Syncope.           Past Medical History:  Diagnosis Date   Alcohol abuse    Anxiety    Chronic back pain    Chronic pain    Depression    Fall    Fracture of right elbow    Osteoarthritis    Psoriasis     Past Surgical History:  Procedure Laterality Date   APPENDECTOMY     COLONOSCOPY WITH PROPOFOL N/A 06/07/2019   diverticulosis, otherwise normal. Non-bleeding internal hemorrhoids.    FOOT SURGERY     per patient: "neurofibroma  excision"   LUMBAR LAMINECTOMY/DECOMPRESSION MICRODISCECTOMY Bilateral 12/31/2015   Procedure: LUMBER DECOMPRESSION L4-5/ L5-S1 BILATERALLY  ;  Surgeon: Jene Every, MD;  Location: [facility];  Service: Orthopedics;  Laterality: Bilateral;   REVERSE SHOULDER ARTHROPLASTY Right 10/24/2018   REVERSE SHOULDER ARTHROPLASTY Right 10/24/2018   Procedure: REVERSE SHOULDER ARTHROPLASTY;  Surgeon: Yolonda Kida, MD;  Location: [facility] OR;  Service: Orthopedics;  Laterality: Right;   SHOULDER SURGERY Right    TOTAL HIP ARTHROPLASTY     TOTAL HIP ARTHROPLASTY Left 08/27/2014   Procedure: LEFT TOTAL HIP ARTHROPLASTY ANTERIOR APPROACH;  Surgeon: Durene Romans, MD;  Location: [facility];  Service: Orthopedics;  Laterality: Left;     Current Outpatient Medications  Medication Sig Dispense Refill   ALPRAZolam (XANAX XR) 0.5 MG 24 hr tablet Take 0.5 mg by mouth 2 (two) times daily.     amoxicillin (AMOXIL) 500 MG capsule amoxicillin 500 mg capsule  TAKE 4 CAPSULES 1 HOUR BEFORE DENTAL APPOINTMENT FOR PRE-MED     amphetamine-dextroamphetamine (ADDERALL) 20 MG tablet Take 10 mg by mouth 2 (two) times daily with a meal.     esomeprazole (NEXIUM) 40 MG capsule Take 40 mg by mouth daily as needed (acid reflux/indigestion.).  folic acid (FOLVITE) 1 MG tablet Take 1 mg by mouth daily.     Golimumab (SIMPONI ARIA IV) Inject into the vein every 2 (two) months.     ibuprofen (ADVIL) 800 MG tablet Take 800 mg by mouth 3 (three) times daily as needed.     levothyroxine (SYNTHROID, LEVOTHROID) 25 MCG tablet Take 25 mcg by mouth daily before breakfast.      methotrexate (RHEUMATREX) 2.5 MG tablet Take 1 tablet by mouth once a week.     traMADol (ULTRAM) 50 MG tablet Take 50 mg by mouth 4 (four) times daily as needed for pain.     No current facility-administered medications for this visit.    Allergies:   Patient has no known allergies.    Social History:  The patient  reports that he has been smoking cigarettes. He  has a 32.00 pack-year smoking history. He has never used smokeless tobacco. He reports that he does not currently use alcohol after a past usage of about 16.0 standard drinks per week. He reports that he does not use drugs.   Family History:  The patient's family history includes Heart attack in his mother.    ROS:  Please see the history of present illness.   Otherwise, review of systems are positive for foot pain- improving.   All other systems are reviewed and negative.    PHYSICAL EXAM: VS:  BP 106/72    Pulse 72    Ht 5\' 11"  (1.803 m)    Wt 166 lb (75.3 kg)    SpO2 98%    BMI 23.15 kg/m  , BMI Body mass index is 23.15 kg/m. GEN: Well nourished, well developed, in no acute distress HEENT: normal Neck: no JVD, carotid bruits, or masses Cardiac: RRR; no murmurs, rubs, or gallops,no edema  Respiratory:  clear to auscultation bilaterally, normal work of breathing GI: soft, nontender, nondistended, + BS MS: no atrophy; left back asymmetric- scoliosis Skin: warm and dry, no rash Neuro:  Strength and sensation are intact Psych: euthymic mood, full affect   EKG:   The ekg ordered today demonstrates NSR, no ST changes   Recent Labs: 08/04/2020: ALT 44   Lipid Panel    Component Value Date/Time   CHOL 186 03/27/2015 1008   TRIG 92 03/27/2015 1008   HDL 61 03/27/2015 1008   CHOLHDL 3.0 03/27/2015 1008   VLDL 18 03/27/2015 1008   LDLCALC 107 03/27/2015 1008     Other studies Reviewed: Additional studies/ records that were reviewed today with results demonstrating: LDL 71 in 2021.   ASSESSMENT AND PLAN:  Coronary artery calcification: No angina.  Continue aggressive secondary prevention.  We spoke about healthy lifestyle including whole food, plant-based diet.  We spoke about starting low-dose rosuvastatin to help reduce inflammation.  He is agreeable to start rosuvastatin 10 mg daily.  Check liver and lipids in 3 months. Hypothyroid: Continue Synthroid. Tobacco abuse: He is  working on smoking cessation. H/o EtOH abuse: He continues to abstain from alcohol. Anxiety: Currently using medications for his anxiety.   Current medicines are reviewed at length with the patient today.  The patient concerns regarding his medicines were addressed.  The following changes have been made: Start rosuvastatin  Labs/ tests ordered today include: Lipids liver in 3 months No orders of the defined types were placed in this encounter.   Recommend 150 minutes/week of aerobic exercise Low fat, low carb, high fiber diet recommended  Disposition:   FU in 1 year  Signed, Lance MussJayadeep Keely Drennan, MD  06/03/2021 9:18 AM    [facility] [address], [place], [place]  [address] Phone: [phone]; Fax: [phone]

## 2021-06-03 ENCOUNTER — Encounter: Payer: Self-pay | Admitting: Interventional Cardiology

## 2021-06-03 ENCOUNTER — Other Ambulatory Visit: Payer: Self-pay

## 2021-06-03 ENCOUNTER — Ambulatory Visit: Payer: 59 | Admitting: Interventional Cardiology

## 2021-06-03 VITALS — BP 106/72 | HR 72 | Ht 71.0 in | Wt 166.0 lb

## 2021-06-03 DIAGNOSIS — E782 Mixed hyperlipidemia: Secondary | ICD-10-CM

## 2021-06-03 DIAGNOSIS — F1011 Alcohol abuse, in remission: Secondary | ICD-10-CM | POA: Diagnosis not present

## 2021-06-03 DIAGNOSIS — Z720 Tobacco use: Secondary | ICD-10-CM | POA: Diagnosis not present

## 2021-06-03 DIAGNOSIS — I7121 Aneurysm of the ascending aorta, without rupture: Secondary | ICD-10-CM | POA: Diagnosis not present

## 2021-06-03 DIAGNOSIS — Z79899 Other long term (current) drug therapy: Secondary | ICD-10-CM

## 2021-06-03 MED ORDER — ROSUVASTATIN CALCIUM 10 MG PO TABS: 10.0000 mg | ORAL_TABLET | Freq: Every day | ORAL | 3 refills | Status: DC

## 2021-06-03 NOTE — Patient Instructions (Signed)
Medication Instructions:  Your physician has recommended you make the following change in your medication: Start Rosuvastatin 10 mg by mouth daily.  *If you need a refill on your cardiac medications before your next appointment, please call your pharmacy*   Lab Work: Your physician recommends that you return for lab work on September 01, 2021.  Lipid and liver profiles.  This will be fasting.  The lab opens at 7:30 AM  If you have labs (blood work) drawn today and your tests are completely normal, you will receive your results only by: [place] (if you have MyChart) OR A paper copy in the mail If you have any lab test that is abnormal or we need to change your treatment, we will call you to review the results.   Testing/Procedures: none   Follow-Up: At [facility], you and your health needs are our priority.  As part of our continuing mission to provide you with exceptional heart care, we have created designated Provider Care Teams.  These Care Teams include your primary Cardiologist (physician) and Advanced Practice Providers (APPs -  Physician Assistants and Nurse Practitioners) who all work together to provide you with the care you need, when you need it.  We recommend signing up for the patient portal called "MyChart".  Sign up information is provided on this After Visit Summary.  MyChart is used to connect with patients for Virtual Visits (Telemedicine).  Patients are able to view lab/test results, encounter notes, upcoming appointments, etc.  Non-urgent messages can be sent to your provider as well.   To learn more about what you can do with MyChart, go to [email or web address].    Your next appointment:   12 month(s)  The format for your next appointment:   In Person  Provider:   Larae Grooms, MD     Other Instructions High-Fiber Eating Plan Fiber, also called dietary fiber, is a type of carbohydrate. It is found foods such as fruits, vegetables, whole grains,  and beans. A high-fiber diet can have many health benefits. Your health care provider may recommend a high-fiber diet to help: Prevent constipation. Fiber can make your bowel movements more regular. Lower your cholesterol. Relieve the following conditions: Inflammation of veins in the anus (hemorrhoids). Inflammation of specific areas of the digestive tract (uncomplicated diverticulosis). A problem of the large intestine, also called the colon, that sometimes causes pain and diarrhea (irritable bowel syndrome, or IBS). Prevent overeating as part of a weight-loss plan. Prevent heart disease, type 2 diabetes, and certain cancers. What are tips for following this plan? Reading food labels  Check the nutrition facts label on food products for the amount of dietary fiber. Choose foods that have 5 grams of fiber or more per serving. The goals for recommended daily fiber intake include: Men (age 74 or younger): 34-38 g. Men (over age 68): 28-34 g. Women (age 24 or younger): 25-28 g. Women (over age 32): 22-25 g. Your daily fiber goal is _____________ g. Shopping Choose whole fruits and vegetables instead of processed forms, such as apple juice or applesauce. Choose a wide variety of high-fiber foods such as avocados, lentils, oats, and kidney beans. Read the nutrition facts label of the foods you choose. Be aware of foods with added fiber. These foods often have high sugar and sodium amounts per serving. Cooking Use whole-grain flour for baking and cooking. Cook with brown rice instead of white rice. Meal planning Start the day with a breakfast that is high in fiber, such  as a cereal that contains 5 g of fiber or more per serving. Eat breads and cereals that are made with whole-grain flour instead of refined flour or white flour. Eat brown rice, bulgur wheat, or millet instead of white rice. Use beans in place of meat in soups, salads, and pasta dishes. Be sure that half of the grains you eat  each day are whole grains. General information You can get the recommended daily intake of dietary fiber by: Eating a variety of fruits, vegetables, grains, nuts, and beans. Taking a fiber supplement if you are not able to take in enough fiber in your diet. It is better to get fiber through food than from a supplement. Gradually increase how much fiber you consume. If you increase your intake of dietary fiber too quickly, you may have bloating, cramping, or gas. Drink plenty of water to help you digest fiber. Choose high-fiber snacks, such as berries, raw vegetables, nuts, and popcorn. What foods should I eat? Fruits Berries. Pears. Apples. Oranges. Avocado. Prunes and raisins. Dried figs. Vegetables Sweet potatoes. Spinach. Kale. Artichokes. Cabbage. Broccoli. Cauliflower. Green peas. Carrots. Squash. Grains Whole-grain breads. Multigrain cereal. Oats and oatmeal. Brown rice. Barley. Bulgur wheat. [place]. Quinoa. Bran muffins. Popcorn. Rye wafer crackers. Meats and other proteins Navy beans, kidney beans, and pinto beans. Soybeans. Split peas. Lentils. Nuts and seeds. Dairy Fiber-fortified yogurt. Beverages Fiber-fortified soy milk. Fiber-fortified orange juice. Other foods Fiber bars. The items listed above may not be a complete list of recommended foods and beverages. Contact a dietitian for more information. What foods should I avoid? Fruits Fruit juice. Cooked, strained fruit. Vegetables Fried potatoes. Canned vegetables. Well-cooked vegetables. Grains White bread. Pasta made with refined flour. White rice. Meats and other proteins Fatty cuts of meat. Fried chicken or fried fish. Dairy Milk. Yogurt. Cream cheese. Sour cream. Fats and oils Butters. Beverages Soft drinks. Other foods Cakes and pastries. The items listed above may not be a complete list of foods and beverages to avoid. Talk with your dietitian about what choices are best for you. Summary Fiber is a type  of carbohydrate. It is found in foods such as fruits, vegetables, whole grains, and beans. A high-fiber diet has many benefits. It can help to prevent constipation, lower blood cholesterol, aid weight loss, and reduce your risk of heart disease, diabetes, and certain cancers. Increase your intake of fiber gradually. Increasing fiber too quickly may cause cramping, bloating, and gas. Drink plenty of water while you increase the amount of fiber you consume. The best sources of fiber include whole fruits and vegetables, whole grains, nuts, seeds, and beans. This information is not intended to replace advice given to you by your health care provider. Make sure you discuss any questions you have with your health care provider. Document Revised: 09/06/2019 Document Reviewed: 09/06/2019 Elsevier Patient Education  2022 [employer].

## 2021-09-01 ENCOUNTER — Other Ambulatory Visit: Payer: 59

## 2021-09-01 DIAGNOSIS — E782 Mixed hyperlipidemia: Secondary | ICD-10-CM

## 2021-09-01 DIAGNOSIS — Z79899 Other long term (current) drug therapy: Secondary | ICD-10-CM

## 2021-09-01 LAB — LIPID PANEL
Chol/HDL Ratio: 2.2 ratio (ref 0.0–5.0)
Cholesterol, Total: 111 mg/dL (ref 100–199)
HDL: 51 mg/dL (ref >39)
LDL Chol Calc (NIH): 49 mg/dL (ref 0–99)
Triglycerides: 45 mg/dL (ref 0–149)
VLDL Cholesterol Cal: 11 mg/dL (ref 5–40)

## 2021-09-01 LAB — HEPATIC FUNCTION PANEL
ALT: 48 IU/L — ABNORMAL HIGH (ref 0–44)
AST: 46 IU/L — ABNORMAL HIGH (ref 0–40)
Albumin: 4.1 g/dL (ref 3.8–4.9)
Alkaline Phosphatase: 123 IU/L — ABNORMAL HIGH (ref 44–121)
Bilirubin Total: 0.5 mg/dL (ref 0.0–1.2)
Bilirubin, Direct: 0.19 mg/dL (ref 0.00–0.40)
Total Protein: 5.6 g/dL — ABNORMAL LOW (ref 6.0–8.5)

## 2021-09-11 ENCOUNTER — Telehealth: Payer: Self-pay | Admitting: *Deleted

## 2021-09-11 DIAGNOSIS — E782 Mixed hyperlipidemia: Secondary | ICD-10-CM

## 2021-09-11 NOTE — Telephone Encounter (Signed)
-----   Message from [name], MD sent at 09/05/2021 11:59 PM EDT ----- ?Minimal LFT elevation. Stable from prior. Lipids well controlled. Would continue current meds.  Repeat lipids and liver in 6 months.  ?

## 2021-09-11 NOTE — Telephone Encounter (Signed)
Patient notified.  He will come in for fasting lab work on March 13, 2022.  ?

## 2021-09-11 NOTE — Telephone Encounter (Signed)
Patient will come in for lab work on October 27 ?

## 2021-12-24 ENCOUNTER — Inpatient Hospital Stay: Admission: RE | Admit: 2021-12-24 | Payer: 59 | Source: Ambulatory Visit

## 2021-12-24 ENCOUNTER — Ambulatory Visit (HOSPITAL_BASED_OUTPATIENT_CLINIC_OR_DEPARTMENT_OTHER)
Admission: RE | Admit: 2021-12-24 | Discharge: 2021-12-24 | Disposition: A | Discharge status: Home / Self Care | Payer: 59 | Source: Ambulatory Visit | Attending: Interventional Cardiology | Admitting: Interventional Cardiology

## 2021-12-24 ENCOUNTER — Encounter (HOSPITAL_BASED_OUTPATIENT_CLINIC_OR_DEPARTMENT_OTHER): Payer: Self-pay

## 2021-12-24 DIAGNOSIS — I712 Thoracic aortic aneurysm, without rupture, unspecified: Secondary | ICD-10-CM | POA: Insufficient documentation

## 2021-12-24 MED ORDER — IOHEXOL 350 MG/ML SOLN
100.0000 mL | Freq: Once | INTRAVENOUS | Status: AC | PRN
  Administered 2021-12-24: 75 mL via INTRAVENOUS

## 2021-12-25 ENCOUNTER — Other Ambulatory Visit: Payer: Self-pay | Admitting: *Deleted

## 2021-12-25 DIAGNOSIS — I7121 Aneurysm of the ascending aorta, without rupture: Secondary | ICD-10-CM

## 2022-03-11 ENCOUNTER — Ambulatory Visit: Payer: 59 | Attending: Interventional Cardiology

## 2022-03-11 DIAGNOSIS — E782 Mixed hyperlipidemia: Secondary | ICD-10-CM

## 2022-03-12 ENCOUNTER — Other Ambulatory Visit: Payer: 59

## 2022-03-12 LAB — HEPATIC FUNCTION PANEL
ALT: 59 IU/L — ABNORMAL HIGH (ref 0–44)
AST: 58 IU/L — ABNORMAL HIGH (ref 0–40)
Albumin: 4.2 g/dL (ref 3.8–4.9)
Alkaline Phosphatase: 103 IU/L (ref 44–121)
Bilirubin Total: 0.7 mg/dL (ref 0.0–1.2)
Bilirubin, Direct: 0.21 mg/dL (ref 0.00–0.40)
Total Protein: 5.7 g/dL — ABNORMAL LOW (ref 6.0–8.5)

## 2022-03-12 LAB — LIPID PANEL
Chol/HDL Ratio: 2.1 ratio (ref 0.0–5.0)
Cholesterol, Total: 117 mg/dL (ref 100–199)
HDL: 55 mg/dL (ref >39)
LDL Chol Calc (NIH): 47 mg/dL (ref 0–99)
Triglycerides: 70 mg/dL (ref 0–149)
VLDL Cholesterol Cal: 15 mg/dL (ref 5–40)

## 2022-03-18 ENCOUNTER — Telehealth: Payer: Self-pay | Admitting: *Deleted

## 2022-03-18 DIAGNOSIS — E782 Mixed hyperlipidemia: Secondary | ICD-10-CM

## 2022-03-18 DIAGNOSIS — R7989 Other specified abnormal findings of blood chemistry: Secondary | ICD-10-CM

## 2022-03-18 MED ORDER — ROSUVASTATIN CALCIUM 5 MG PO TABS: 5.0000 mg | ORAL_TABLET | Freq: Every day | ORAL | 3 refills | Status: DC

## 2022-03-18 NOTE — Telephone Encounter (Signed)
-----   Message from [name], MD sent at 03/16/2022 10:57 PM EDT ----- LFTs slightly increased from baseline.  Lipids well controlled. Could decrease Crestor to 5 mg daily and recheck liver and lipids in 3 months.

## 2022-03-18 NOTE — Telephone Encounter (Signed)
Patient notified. Prescription sent to [facility] on [address].  Patient will have fasting lab work done on June 02, 2022 so results will be available for appointment on January 22.

## 2022-03-31 ENCOUNTER — Encounter: Payer: Self-pay | Admitting: Internal Medicine

## 2022-03-31 ENCOUNTER — Encounter: Payer: Self-pay | Admitting: *Deleted

## 2022-03-31 ENCOUNTER — Ambulatory Visit (INDEPENDENT_AMBULATORY_CARE_PROVIDER_SITE_OTHER): Payer: 59 | Admitting: Internal Medicine

## 2022-03-31 DIAGNOSIS — R1031 Right lower quadrant pain: Secondary | ICD-10-CM

## 2022-03-31 DIAGNOSIS — R7989 Other specified abnormal findings of blood chemistry: Secondary | ICD-10-CM

## 2022-03-31 DIAGNOSIS — K4090 Unilateral inguinal hernia, without obstruction or gangrene, not specified as recurrent: Secondary | ICD-10-CM | POA: Diagnosis not present

## 2022-03-31 DIAGNOSIS — K760 Fatty (change of) liver, not elsewhere classified: Secondary | ICD-10-CM | POA: Diagnosis not present

## 2022-03-31 DIAGNOSIS — K219 Gastro-esophageal reflux disease without esophagitis: Secondary | ICD-10-CM

## 2022-03-31 NOTE — Progress Notes (Signed)
Referring Provider: Elfredia Nevins, MD Primary Care Physician:  Elfredia Nevins, MD Primary GI:  Dr. Marletta Lor  Chief Complaint  Patient presents with   Abdominal Pain    Having lower abdominal pain. Patient thinks it is from a hernia.     HPI:   Howard [name] is a 56 y.o. male who presents to clinic today for follow-up visit.  Has not been seen in our clinic since May 2022.  History of chronically abnormal aminotransferases, previous alcohol use, sober for over 4 years, chronic methotrexate use for psoriatic arthritis.  Last ultrasound 2020 showed fatty liver disease.  No previous serological work-up that I can find.  Complaining of right lower quadrant abdominal pain and hernia.  Uses a binder regularly.  Easily reducible at present though causes him pain and discomfort in the region which radiates to his back.  Last colonoscopy 06/07/2019 with hemorrhoids, sigmoid diverticulosis, otherwise unremarkable.  Recommended 10-year recall.  Also with history of chronic GERD for which he takes Nexium 40 mg as needed.  No dysphagia odynophagia.  No epigastric or chest pain.  Past Medical History:  Diagnosis Date   Alcohol abuse    Anxiety    Chronic back pain    Chronic pain    Depression    Fall    Fracture of right elbow    Osteoarthritis    Psoriasis     Past Surgical History:  Procedure Laterality Date   APPENDECTOMY     COLONOSCOPY WITH PROPOFOL N/A 06/07/2019   diverticulosis, otherwise normal. Non-bleeding internal hemorrhoids.    FOOT SURGERY     per patient: "neurofibroma excision"   LUMBAR LAMINECTOMY/DECOMPRESSION MICRODISCECTOMY Bilateral 12/31/2015   Procedure: LUMBER DECOMPRESSION L4-5/ L5-S1 BILATERALLY  ;  Surgeon: Jene Every, MD;  Location: [facility];  Service: Orthopedics;  Laterality: Bilateral;   REVERSE SHOULDER ARTHROPLASTY Right 10/24/2018   REVERSE SHOULDER ARTHROPLASTY Right 10/24/2018   Procedure: REVERSE SHOULDER ARTHROPLASTY;  Surgeon: Yolonda Kida, MD;  Location: [facility] OR;  Service: Orthopedics;  Laterality: Right;   SHOULDER SURGERY Right    TOTAL HIP ARTHROPLASTY     TOTAL HIP ARTHROPLASTY Left 08/27/2014   Procedure: LEFT TOTAL HIP ARTHROPLASTY ANTERIOR APPROACH;  Surgeon: Durene Romans, MD;  Location: [facility];  Service: Orthopedics;  Laterality: Left;       Allergies as of 03/31/2022   (No Known Allergies)    Family History  Problem Relation Age of Onset   Heart attack Mother    Colon cancer Neg Hx     Social History   Socioeconomic History   Marital status: Married    Spouse name: Not on file   Number of children: Not on file   Years of education: Not on file   Highest education level: Not on file  Occupational History   Not on file  Tobacco Use   Smoking status: Every Day    Packs/day: 1.00    Years: 32.00    Total pack years: 32.00    Types: Cigarettes    Passive exposure: Current   Smokeless tobacco: Never  Vaping Use   Vaping Use: Never used  Substance and Sexual Activity   Alcohol use: Not Currently    Alcohol/week: 16.0 standard drinks of alcohol    Types: 6 Glasses of wine, 10 Cans of beer per week    Comment: None currently (last ETOH 03/01/2018); previously drinks 3 to 4 beers daily and 1 glass of wine; "way more" on the weekend  Drug use: No    Comment: hx of marijuana use years ago    Sexual activity: Not on file  Other Topics Concern   Not on file  Social History Narrative   Not on file   Social Determinants of Health   Financial Resource Strain: Not on file  Food Insecurity: Not on file  Transportation Needs: Not on file  Physical Activity: Not on file  Stress: Not on file  Social Connections: Not on file    Subjective: Review of Systems  Constitutional:  Negative for chills and fever.  HENT:  Negative for congestion and hearing loss.   Eyes:  Negative for blurred vision and double vision.  Respiratory:  Negative for cough and shortness of breath.   Cardiovascular:   Negative for chest pain and palpitations.  Gastrointestinal:  Positive for abdominal pain. Negative for blood in stool, constipation, diarrhea, heartburn, melena and vomiting.  Genitourinary:  Negative for dysuria and urgency.  Musculoskeletal:  Negative for joint pain and myalgias.  Skin:  Negative for itching and rash.  Neurological:  Negative for dizziness and headaches.  Psychiatric/Behavioral:  Negative for depression. The patient is not nervous/anxious.      Objective: BP 108/64 (BP Location: Left Arm, Patient Position: Sitting, Cuff Size: Large)   Pulse 81   Temp 97.9 F (36.6 C) (Oral)   Ht 5\' 11"  (1.803 m)   Wt 166 lb 3.2 oz (75.4 kg)   BMI 23.18 kg/m  Physical Exam Constitutional:      Appearance: Normal appearance.  HENT:     Head: Normocephalic and atraumatic.  Eyes:     Extraocular Movements: Extraocular movements intact.     Conjunctiva/sclera: Conjunctivae normal.  Cardiovascular:     Rate and Rhythm: Normal rate and regular rhythm.  Pulmonary:     Effort: Pulmonary effort is normal.     Breath sounds: Normal breath sounds.  Abdominal:     General: Bowel sounds are normal.     Palpations: Abdomen is soft.     Hernia: A hernia is present. Hernia is present in the right inguinal area.  Musculoskeletal:        General: Normal range of motion.     Cervical back: Normal range of motion and neck supple.  Skin:    General: Skin is warm.  Neurological:     General: No focal deficit present.     Mental Status: He is alert and oriented to person, place, and time.  Psychiatric:        Mood and Affect: Mood normal.        Behavior: Behavior normal.     Assessment: *Right-sided inguinal hernia *Abnormal aminotransferases *Fatty liver disease *Chronic GERD  Plan: Patient with history of fatty liver disease, chronic methotrexate use.  Previous alcohol use (has been sober for over 4 years).  I do not see where he has had full serological work-up prior.  We  will check for viral hepatitis, autoimmune hepatitis, hemochromatosis, celiac disease, A1A deficiency.  We will update ultrasound with elastography.  Refer to general surgery to discuss right inguinal hernia repair.  Patient wishes to see Dr. .  Continue abdominal binder.  Counseled if he has pain and unable to reduce that he will need to go to the emergency room and he understands.  Chronic GERD well-controlled on Nexium as needed.  We will continue.  Colonoscopy recall 2031.  Follow-up in 6 months.    03/31/2022 9:35 AM   Disclaimer: This note was dictated  with voice recognition software. Similar sounding words can inadvertently be transcribed and may not be corrected upon review.

## 2022-03-31 NOTE — Patient Instructions (Signed)
I am going to order ultrasound with elastography to further evaluate your liver.  I am also going to order blood work at [facility] today to check for secondary causes of chronic liver disease.  We will call you with these results.  I will refer you to Dr. Lovell Sheehan of general surgery to discuss inguinal hernia repair.  Follow-up with me in 6 months.  It was very nice meeting you today.  Dr. Marletta Lor  Nonalcoholic Fatty Liver Disease Diet, Adult Nonalcoholic fatty liver disease is a condition that causes fat to build up in and around the liver. The disease makes it harder for the liver to work the way that it should. Following a healthy diet can help to keep nonalcoholic fatty liver disease under control. It can also help to prevent or improve conditions that are associated with the disease, such as heart disease, diabetes, high blood pressure, and abnormal cholesterol levels. Along with regular exercise, this diet: Promotes weight loss. Helps to control blood sugar levels. Helps to improve the way that the body uses insulin. What are tips for following this plan? Reading food labels  Always check food labels for: The amount of saturated fat in a food. You should limit your intake of saturated fat. Saturated fat is found in foods that come from animals, including meat and dairy products such as butter, cheese, and whole milk. The amount of fiber in a food. You should choose high-fiber foods such as fruits, vegetables, and whole grains. Try to get 25-30 grams (g) of fiber a day.   Cooking When cooking, use heart-healthy oils that are high in monounsaturated fats. These include olive oil, canola oil, and avocado oil. Limit frying or deep-frying foods. Cook foods using healthy methods such as baking, boiling, steaming, and grilling instead. Meal planning You may want to keep track of how many calories you take in. Eating the right amount of calories will help you achieve a healthy weight. Meeting  with a registered dietitian can help you get started. Limit how often you eat takeout and fast food. These foods are usually very high in fat, salt, and sugar. Use the glycemic index (GI) to plan your meals. The index tells you how quickly a food will raise your blood sugar. Choose low-GI foods (GI less than 55). These foods take a longer time to raise blood sugar. A registered dietitian can help you identify foods lower on the GI scale. Lifestyle You may want to follow a Mediterranean diet. This diet includes a lot of vegetables, lean meats or fish, whole grains, fruits, and healthy oils and fats. What foods can I eat?    Fruits Bananas. Apples. Oranges. Grapes. Papaya. Mango. Pomegranate. Kiwi. Grapefruit. Cherries. Vegetables Lettuce. Spinach. Peas. Beets. Cauliflower. Cabbage. Broccoli. Carrots. Tomatoes. Squash. Eggplant. Herbs. Peppers. Onions. Cucumbers. Brussels sprouts. Yams and sweet potatoes. Beans. Lentils. Grains Whole wheat or whole-grain foods, including breads, crackers, cereals, and pasta. Stone-ground whole wheat. Unsweetened oatmeal. Bulgur. Barley. Quinoa. Brown or wild rice. Corn or whole wheat flour tortillas. Meats and other proteins Lean meats. Poultry. Tofu. Seafood and shellfish. Dairy Low-fat or fat-free dairy products, such as yogurt, cottage cheese, or cheese. Beverages Water. Sugar-free drinks. Tea. Coffee. Low-fat or skim milk. Milk alternatives, such as soy or almond milk. Real fruit juice. Fats and oils Avocado. Canola or olive oil. Nuts and nut butters. Seeds. Seasonings and condiments Mustard. Relish. Low-fat, low-sugar ketchup and barbecue sauce. Low-fat or fat-free mayonnaise. Sweets and desserts Sugar-free sweets. The items listed above  may not be a complete list of foods and beverages you can eat. Contact a dietitian for more information. What foods should I limit or avoid? Meats and other proteins Limit red meat to 1-2 times a  week. Dairy [employer]. Fats and oils Palm oil and coconut oil. Fried foods. Other foods Processed foods. Foods that contain a lot of salt or sodium. Sweets and desserts Sweets that contain sugar. Beverages Sweetened drinks, such as sweet tea, milkshakes, iced sweet drinks, and sodas. Alcohol. The items listed above may not be a complete list of foods and beverages you should avoid. Contact a dietitian for more information. Where to find more information The [employer] of Diabetes and Digestive and Kidney Diseases: [email or web address] Summary Nonalcoholic fatty liver disease is a condition that causes fat to build up in and around the liver. Following a healthy diet can help to keep nonalcoholic fatty liver disease under control. Your diet should be rich in fruits, vegetables, whole grains, and lean proteins. Limit your intake of saturated fat. Saturated fat is found in foods that come from animals, including meat and dairy products such as butter, cheese, and whole milk. This diet promotes weight loss, helps to control blood sugar levels, and helps to improve the way that the body uses insulin. This information is not intended to replace advice given to you by your health care provider. Make sure you discuss any questions you have with your health care provider. Document Revised: 08/25/2018 Document Reviewed: 05/25/2018 Elsevier Patient Education  2020 [employer].

## 2022-04-05 ENCOUNTER — Ambulatory Visit: Payer: 59 | Admitting: Gastroenterology

## 2022-04-12 LAB — ANTI-SMOOTH MUSCLE ANTIBODY, IGG: Actin (Smooth Muscle) Antibody (IGG): <20 U (ref <20)

## 2022-04-12 LAB — IRON,TIBC AND FERRITIN PANEL
%SAT: 95 % (calc) — ABNORMAL HIGH (ref 20–48)
Ferritin: 2828 ng/mL — ABNORMAL HIGH (ref 38–380)
Iron: 182 ug/dL — ABNORMAL HIGH (ref 50–180)
TIBC: 192 mcg/dL (calc) — ABNORMAL LOW (ref 250–425)

## 2022-04-12 LAB — HEPATITIS C ANTIBODY: Hepatitis C Ab: NONREACTIVE

## 2022-04-12 LAB — HEPATITIS B SURFACE ANTIBODY,QUALITATIVE: Hep B S Ab: NONREACTIVE

## 2022-04-12 LAB — HEPATITIS A ANTIBODY, TOTAL: Hepatitis A AB,Total: NONREACTIVE

## 2022-04-12 LAB — ALPHA-1-ANTITRYPSIN: A-1 Antitrypsin, Ser: 100 mg/dL (ref 83–199)

## 2022-04-12 LAB — ANTI-NUCLEAR AB-TITER (ANA TITER): ANA Titer 1: 1:40 {titer} — ABNORMAL HIGH

## 2022-04-12 LAB — HEPATITIS B SURFACE ANTIGEN: Hepatitis B Surface Ag: NONREACTIVE

## 2022-04-12 LAB — IGG, IGA, IGM
IgG (Immunoglobin G), Serum: 412 mg/dL — ABNORMAL LOW (ref 600–1640)
IgM, Serum: 70 mg/dL (ref 50–300)
Immunoglobulin A: 101 mg/dL (ref 47–310)

## 2022-04-12 LAB — ANA: Anti Nuclear Antibody (ANA): POSITIVE — AB

## 2022-04-12 LAB — TISSUE TRANSGLUTAMINASE ABS,IGG,IGA
(tTG) Ab, IgA: <1 U/mL
(tTG) Ab, IgG: <1 U/mL

## 2022-04-12 LAB — HEPATITIS B CORE ANTIBODY, TOTAL: Hep B Core Total Ab: NONREACTIVE

## 2022-04-12 LAB — PROTIME-INR
INR: 1
Prothrombin Time: 10.9 s (ref 9.0–11.5)

## 2022-04-13 ENCOUNTER — Other Ambulatory Visit (HOSPITAL_COMMUNITY): Payer: 59

## 2022-04-15 ENCOUNTER — Telehealth: Payer: Self-pay | Admitting: Internal Medicine

## 2022-04-15 DIAGNOSIS — R7989 Other specified abnormal findings of blood chemistry: Secondary | ICD-10-CM

## 2022-04-15 NOTE — Telephone Encounter (Signed)
See other note

## 2022-04-15 NOTE — Telephone Encounter (Signed)
Phoned the pt and LMOVM for the pt to return call 

## 2022-04-15 NOTE — Telephone Encounter (Signed)
Patient's blood work showed his iron studies are quite abnormal.  He has a significantly elevated ferritin level as well as Laveda Norman saturation level.  I need to perform a further blood test to rule out a condition called hemochromatosis which is a disorder in the body with too much iron.  I ordered this to be done at [facility].  We will call with results.  Proceed with ultrasound which is already scheduled.

## 2022-04-15 NOTE — Telephone Encounter (Signed)
Howard [name], the patient left a message saying he was returning a call but he didn't know who called.

## 2022-04-16 NOTE — Telephone Encounter (Signed)
Phoned and spoke with the pt and advised of results of labs. Pt was also advised to go to [facility] for additional bloodwork and to proceed with U/S. Pt states he is scheduled for next month and he will go for labs next week. Advised the pt we will call him with the results once they come in. Pt expressed understanding.

## 2022-04-20 ENCOUNTER — Ambulatory Visit: Payer: 59 | Admitting: General Surgery

## 2022-04-20 ENCOUNTER — Encounter: Payer: Self-pay | Admitting: General Surgery

## 2022-04-20 VITALS — BP 105/70 | HR 82 | Temp 98.0°F | Resp 12 | Ht 71.0 in | Wt 166.0 lb

## 2022-04-20 DIAGNOSIS — K4090 Unilateral inguinal hernia, without obstruction or gangrene, not specified as recurrent: Secondary | ICD-10-CM | POA: Diagnosis not present

## 2022-04-20 NOTE — H&P (Signed)
Howard [name]; [id number]; 05-Jun-1965   HPI Patient is a 56 year old white male who was referred to my care by Dr. Sherwood Gambler and Dr. Marletta Lor for evaluation and treatment of a right inguinal hernia.  He states he started having swelling and pain in the right groin region over the past few months.  It is made worse with straining.  No nausea or vomiting have been noted. Past Medical History:  Diagnosis Date   Alcohol abuse    Anxiety    Chronic back pain    Chronic pain    Depression    Fall    Fracture of right elbow    Osteoarthritis    Psoriasis     Past Surgical History:  Procedure Laterality Date   APPENDECTOMY     COLONOSCOPY WITH PROPOFOL N/A 06/07/2019   diverticulosis, otherwise normal. Non-bleeding internal hemorrhoids.    FOOT SURGERY     per patient: "neurofibroma excision"   LUMBAR LAMINECTOMY/DECOMPRESSION MICRODISCECTOMY Bilateral 12/31/2015   Procedure: LUMBER DECOMPRESSION L4-5/ L5-S1 BILATERALLY  ;  Surgeon: Jene Every, MD;  Location: [facility];  Service: Orthopedics;  Laterality: Bilateral;   REVERSE SHOULDER ARTHROPLASTY Right 10/24/2018   REVERSE SHOULDER ARTHROPLASTY Right 10/24/2018   Procedure: REVERSE SHOULDER ARTHROPLASTY;  Surgeon: Yolonda Kida, MD;  Location: [facility] OR;  Service: Orthopedics;  Laterality: Right;   SHOULDER SURGERY Right    TOTAL HIP ARTHROPLASTY     TOTAL HIP ARTHROPLASTY Left 08/27/2014   Procedure: LEFT TOTAL HIP ARTHROPLASTY ANTERIOR APPROACH;  Surgeon: Durene Romans, MD;  Location: [facility];  Service: Orthopedics;  Laterality: Left;    Family History  Problem Relation Age of Onset   Heart attack Mother    Colon cancer Neg Hx     Current Outpatient Medications on File Prior to Visit  Medication Sig Dispense Refill   ALPRAZolam (XANAX XR) 0.5 MG 24 hr tablet Take 0.5 mg by mouth 2 (two) times daily.     amoxicillin (AMOXIL) 500 MG capsule amoxicillin 500 mg capsule  TAKE 4 CAPSULES 1 HOUR BEFORE DENTAL APPOINTMENT FOR PRE-MED      amphetamine-dextroamphetamine (ADDERALL) 20 MG tablet Take 10 mg by mouth 2 (two) times daily with a meal.     Buprenorphine HCl-Naloxone HCl 8-2 MG FILM 1/2 FILM FIVE TIMES DAILY, MAX DAILY DOSE: 2.5      DULoxetine (CYMBALTA) 30 MG capsule Take 30 mg by mouth daily.     DULoxetine (CYMBALTA) 60 MG capsule Take 60 mg by mouth daily.     esomeprazole (NEXIUM) 40 MG capsule Take 40 mg by mouth daily as needed (acid reflux/indigestion.).      folic acid (FOLVITE) 1 MG tablet Take 1 mg by mouth daily.     Golimumab (SIMPONI ARIA IV) Inject into the vein every 2 (two) months.     ibuprofen (ADVIL) 800 MG tablet Take 800 mg by mouth 3 (three) times daily as needed.     levothyroxine (SYNTHROID, LEVOTHROID) 25 MCG tablet Take 25 mcg by mouth daily before breakfast.      Methotrexate, Anti-Rheumatic, (METHOTREXATE, PF, [place]) Inject into the skin. Inject once weekly     rosuvastatin (CRESTOR) 5 MG tablet Take 1 tablet (5 mg total) by mouth daily. 90 tablet 3   No current facility-administered medications on file prior to visit.    No Known Allergies  Social History   Substance and Sexual Activity  Alcohol Use Not Currently   Alcohol/week: 16.0 standard drinks of alcohol   Types: 6 Glasses  of wine, 10 Cans of beer per week   Comment: None currently (last ETOH 03/01/2018); previously drinks 3 to 4 beers daily and 1 glass of wine; "way more" on the weekend    Social History   Tobacco Use  Smoking Status Every Day   Packs/day: 1.00   Years: 32.00   Total pack years: 32.00   Types: Cigarettes   Passive exposure: Current  Smokeless Tobacco Never    Review of Systems  Constitutional: Negative.   HENT: Negative.    Eyes: Negative.   Respiratory: Negative.    Cardiovascular: Negative.   Gastrointestinal:  Positive for abdominal pain.  Genitourinary: Negative.   Musculoskeletal:  Positive for back pain, joint pain and neck pain.  Skin: Negative.   Neurological: Negative.    Endo/Heme/Allergies: Negative.   Psychiatric/Behavioral: Negative.      Objective   Vitals:   04/20/22 1525  BP: 105/70  Pulse: 82  Resp: 12  Temp: 98 F (36.7 C)  SpO2: 93%    Physical Exam Vitals reviewed.  Constitutional:      Appearance: Normal appearance. He is normal weight. He is not ill-appearing.  HENT:     Head: Normocephalic and atraumatic.  Cardiovascular:     Rate and Rhythm: Normal rate and regular rhythm.     Heart sounds: Normal heart sounds. No murmur heard.    No friction rub. No gallop.  Pulmonary:     Effort: Pulmonary effort is normal. No respiratory distress.     Breath sounds: Normal breath sounds. No stridor. No wheezing, rhonchi or rales.  Abdominal:     General: Abdomen is flat. Bowel sounds are normal. There is no distension.     Palpations: Abdomen is soft. There is no mass.     Tenderness: There is no abdominal tenderness. There is no guarding or rebound.     Hernia: A hernia is present.     Comments: Easily reducible right inguinal hernia.  No left inguinal hernia noted.  Genitourinary:    Testes: Normal.  Skin:    General: Skin is warm and dry.  Neurological:     Mental Status: He is alert and oriented to person, place, and time.     Assessment  Right inguinal hernia Plan  Patient is scheduled for robotic assisted right inguinal herniorrhaphy with mesh, possible bilateral on 05/12/2022.  The risks and benefits of the procedure including bleeding, infection, mesh use, and the possibility of recurrence of the hernia were fully explained to the patient, who gave informed consent.

## 2022-04-20 NOTE — Progress Notes (Signed)
Howard [name]; [id number]; 19-Sep-1965   HPI Patient is a 56 year old white male who was referred to my care by Dr. Sherwood Gambler and Dr. Marletta Lor for evaluation and treatment of a right inguinal hernia.  He states he started having swelling and pain in the right groin region over the past few months.  It is made worse with straining.  No nausea or vomiting have been noted. Past Medical History:  Diagnosis Date   Alcohol abuse    Anxiety    Chronic back pain    Chronic pain    Depression    Fall    Fracture of right elbow    Osteoarthritis    Psoriasis     Past Surgical History:  Procedure Laterality Date   APPENDECTOMY     COLONOSCOPY WITH PROPOFOL N/A 06/07/2019   diverticulosis, otherwise normal. Non-bleeding internal hemorrhoids.    FOOT SURGERY     per patient: "neurofibroma excision"   LUMBAR LAMINECTOMY/DECOMPRESSION MICRODISCECTOMY Bilateral 12/31/2015   Procedure: LUMBER DECOMPRESSION L4-5/ L5-S1 BILATERALLY  ;  Surgeon: Jene Every, MD;  Location: [facility];  Service: Orthopedics;  Laterality: Bilateral;   REVERSE SHOULDER ARTHROPLASTY Right 10/24/2018   REVERSE SHOULDER ARTHROPLASTY Right 10/24/2018   Procedure: REVERSE SHOULDER ARTHROPLASTY;  Surgeon: Yolonda Kida, MD;  Location: [facility] OR;  Service: Orthopedics;  Laterality: Right;   SHOULDER SURGERY Right    TOTAL HIP ARTHROPLASTY     TOTAL HIP ARTHROPLASTY Left 08/27/2014   Procedure: LEFT TOTAL HIP ARTHROPLASTY ANTERIOR APPROACH;  Surgeon: Durene Romans, MD;  Location: [facility];  Service: Orthopedics;  Laterality: Left;    Family History  Problem Relation Age of Onset   Heart attack Mother    Colon cancer Neg Hx        No Known Allergies  Social History   Substance and Sexual Activity  Alcohol Use Not Currently   Alcohol/week: 16.0 standard drinks of alcohol   Types: 6 Glasses of wine, 10 Cans of beer per week   Comment: None currently (last ETOH 03/01/2018); previously drinks 3 to 4 beers daily and 1 glass of  wine; "way more" on the weekend    Social History   Tobacco Use  Smoking Status Every Day   Packs/day: 1.00   Years: 32.00   Total pack years: 32.00   Types: Cigarettes   Passive exposure: Current  Smokeless Tobacco Never    Review of Systems  Constitutional: Negative.   HENT: Negative.    Eyes: Negative.   Respiratory: Negative.    Cardiovascular: Negative.   Gastrointestinal:  Positive for abdominal pain.  Genitourinary: Negative.   Musculoskeletal:  Positive for back pain, joint pain and neck pain.  Skin: Negative.   Neurological: Negative.   Endo/Heme/Allergies: Negative.   Psychiatric/Behavioral: Negative.      Objective   Vitals:   04/20/22 1525  BP: 105/70  Pulse: 82  Resp: 12  Temp: 98 F (36.7 C)  SpO2: 93%    Physical Exam Vitals reviewed.  Constitutional:      Appearance: Normal appearance. He is normal weight. He is not ill-appearing.  HENT:     Head: Normocephalic and atraumatic.  Cardiovascular:     Rate and Rhythm: Normal rate and regular rhythm.     Heart sounds: Normal heart sounds. No murmur heard.    No friction rub. No gallop.  Pulmonary:     Effort: Pulmonary effort is normal. No respiratory distress.     Breath sounds: Normal breath sounds. No stridor. No  wheezing, rhonchi or rales.  Abdominal:     General: Abdomen is flat. Bowel sounds are normal. There is no distension.     Palpations: Abdomen is soft. There is no mass.     Tenderness: There is no abdominal tenderness. There is no guarding or rebound.     Hernia: A hernia is present.     Comments: Easily reducible right inguinal hernia.  No left inguinal hernia noted.  Genitourinary:    Testes: Normal.  Skin:    General: Skin is warm and dry.  Neurological:     Mental Status: He is alert and oriented to person, place, and time.     Assessment  Right inguinal hernia Plan  Patient is scheduled for robotic assisted right inguinal herniorrhaphy with mesh, possible bilateral  on 05/12/2022.  The risks and benefits of the procedure including bleeding, infection, mesh use, and the possibility of recurrence of the hernia were fully explained to the patient, who gave informed consent.

## 2022-04-26 ENCOUNTER — Ambulatory Visit (HOSPITAL_COMMUNITY)
Admission: RE | Admit: 2022-04-26 | Discharge: 2022-04-26 | Disposition: A | Discharge status: Home / Self Care | Payer: 59 | Source: Ambulatory Visit | Attending: Internal Medicine | Admitting: Internal Medicine

## 2022-04-26 DIAGNOSIS — K760 Fatty (change of) liver, not elsewhere classified: Secondary | ICD-10-CM | POA: Insufficient documentation

## 2022-04-26 DIAGNOSIS — R7989 Other specified abnormal findings of blood chemistry: Secondary | ICD-10-CM | POA: Diagnosis present

## 2022-04-28 LAB — HEMOCHROMATOSIS DNA-PCR(C282Y,H63D)

## 2022-04-29 ENCOUNTER — Other Ambulatory Visit (INDEPENDENT_AMBULATORY_CARE_PROVIDER_SITE_OTHER): Payer: Self-pay | Admitting: *Deleted

## 2022-04-29 ENCOUNTER — Encounter (INDEPENDENT_AMBULATORY_CARE_PROVIDER_SITE_OTHER): Payer: Self-pay | Admitting: *Deleted

## 2022-04-29 DIAGNOSIS — R7989 Other specified abnormal findings of blood chemistry: Secondary | ICD-10-CM

## 2022-04-30 ENCOUNTER — Telehealth: Payer: Self-pay | Admitting: Internal Medicine

## 2022-04-30 NOTE — Telephone Encounter (Signed)
See result note.  

## 2022-04-30 NOTE — Telephone Encounter (Signed)
Patient stopped by the office and wanted to find out "what's going on".  He said that he is scheduled for surgery Dec. 27 with Dr. Lovell Sheehan and he keeps getting calls from a hematologist that his iron levels are "crazy high".  He says they speak in terms that he can't understand and wanted to see if you could help understand what seems to be happening with it.  Call back # he left is [phone]

## 2022-05-05 NOTE — Patient Instructions (Signed)
Aram CandelaJohn R Willbanks  05/05/2022     @PREFPERIOPPHARMACY @   Your procedure is scheduled on  05/12/2022.   Report to [facility] at  0600  A.M.   Call this number if you have problems the morning of surgery:  [phone]  If you experience any cold or flu symptoms such as cough, fever, chills, shortness of breath, etc. between now and your scheduled surgery, please notify us at the above number.   Remember:  Do not eat or drink after midnight.      Take these medicines the morning of surgery with A SIP OF WATER        Xanax(if needed),naloxone, cymbalta, levothyroxine, omeprazole, flomax.     Do not wear jewelry, make-up or nail polish.  Do not wear lotions, powders, or perfumes, or deodorant.  Do not shave 48 hours prior to surgery.  Men may shave face and neck.  Do not bring valuables to the hospital.  [facility] not [facility] any belongings or valuables.  Contacts, dentures or bridgework may not be worn into surgery.  Leave your suitcase in the car.  After surgery it may be brought to your room.  For patients admitted to the hospital, discharge time will be determined by your treatment team.  Patients discharged the day of surgery will not be allowed to drive home and must have someone with them for 24 hours.    Special instructions:   DO NOT smoke tobacco or vape for 24 hours before your procedure.  Please read over the following fact sheets that you were given. Pain Booklet, Coughing and Deep Breathing, Blood Transfusion Information, Surgical Site Infection Prevention, Anesthesia Post-op Instructions, and Care and Recovery After Surgery        Laparoscopic Inguinal Hernia Repair, Adult, Care After The following information offers guidance on how to care for yourself after your procedure. Your health care provider may also give you more specific instructions. If you have problems or questions, contact your health care provider. What can I expect  after the procedure? After the procedure, it is common to have: Pain. Swelling and bruising around the incision area. Scrotal swelling, in males. Some fluid or blood draining from your incisions. Follow these instructions at home: Medicines Take over-the-counter and prescription medicines only as told by your health care provider. Ask your health care provider if the medicine prescribed to you: Requires you to avoid driving or using machinery. Can cause constipation. You may need to take these actions to prevent or treat constipation: Drink enough fluid to keep your urine pale yellow. Take over-the-counter or prescription medicines. Eat foods that are high in fiber, such as beans, whole grains, and fresh fruits and vegetables. Limit foods that are high in fat and processed sugars, such as fried or sweet foods. Incision care  Follow instructions from your health care provider about how to take care of your incisions. Make sure you: Wash your hands with soap and water for at least 20 seconds before and after you change your bandage (dressing). If soap and water are not available, use hand sanitizer. Change your dressing as told by your health care provider. Leave stitches (sutures), skin glue, or adhesive strips in place. These skin closures may need to stay in place for 2 weeks or longer. If adhesive strip edges start to loosen and curl up, you may trim the loose edges. Do not remove adhesive strips completely unless your health care provider tells you  to do that. Check your incision area every day for signs of infection. Check for: More redness, swelling, or pain. More fluid or blood. Warmth. Pus or a bad smell. Wear loose, soft clothing while your incisions heal. Managing pain and swelling If directed, put ice on the painful or swollen areas. To do this: Put ice in a plastic bag. Place a towel between your skin and the bag. Leave the ice on for 20 minutes, 2-3 times a day. Remove  the ice if your skin turns bright red. This is very important. If you cannot feel pain, heat, or cold, you have a greater risk of damage to the area.  Activity Do not lift anything that is heavier than 10 lb (4.5 kg), or the limit that you are told, until your health care provider says that it is safe. Ask your health care provider what activities are safe for you. A lot of activity during the first week after surgery can increase pain and swelling. For 1 week after your procedure: Avoid activities that take a lot of effort, such as exercise or sports. You may walk and climb stairs as needed for daily activity, but avoid long walks or climbing stairs for exercise. General instructions If you were given a sedative during the procedure, it can affect you for several hours. Do not drive or operate machinery until your health care provider says that it is safe. Do not take baths, swim, or use a hot tub until your health care provider approves. Ask your health care provider if you may take showers. You may only be allowed to take sponge baths. Do not use any products that contain nicotine or tobacco. These products include cigarettes, chewing tobacco, and vaping devices, such as e-cigarettes. If you need help quitting, ask your health care provider. Keep all follow-up visits. This is important. Contact a health care provider if: You have any of these signs of infection: More redness, swelling, or pain around your incisions or your groin area. More fluid or blood coming from an incision. Warmth coming from an incision. Pus or a bad smell coming from an incision. A fever or chills. You have more swelling in your scrotum, if you are male. You have severe pain and medicines do not help. You have abdominal pain or swelling. You cannot urinate or have a bowel movement. You faint or feel dizzy. You have nausea and vomiting. Get help right away if: You have redness, warmth, or pain in your leg. You  have chest pain. You have problems breathing. These symptoms may represent a serious problem that is an emergency. Do not wait to see if the symptoms will go away. Get medical help right away. Call your local emergency services (911 in the [place] Do not drive yourself to the hospital. Summary Pain, swelling, and bruising are common after the procedure. Check your incision area every day for signs of infection, such as more redness, swelling, or pain. Put ice on painful or swollen areas for 20 minutes, 2-3 times a day. This information is not intended to replace advice given to you by your health care provider. Make sure you discuss any questions you have with your health care provider. Document Revised: 01/01/2020 Document Reviewed: 01/01/2020 Elsevier Patient Education  2023 [employer] General Anesthesia, Adult, Care After The following information offers guidance on how to care for yourself after your procedure. Your health care provider may also give you more specific instructions. If you have problems or questions, contact your  health care provider. What can I expect after the procedure? After the procedure, it is common for people to: Have pain or discomfort at the IV site. Have nausea or vomiting. Have a sore throat or hoarseness. Have trouble concentrating. Feel cold or chills. Feel weak, sleepy, or tired (fatigue). Have soreness and body aches. These can affect parts of the body that were not involved in surgery. Follow these instructions at home: For the time period you were told by your health care provider:  Rest. Do not participate in activities where you could fall or become injured. Do not drive or use machinery. Do not drink alcohol. Do not take sleeping pills or medicines that cause drowsiness. Do not make important decisions or sign legal documents. Do not take care of children on your own. General instructions Drink enough fluid to keep your urine pale  yellow. If you have sleep apnea, surgery and certain medicines can increase your risk for breathing problems. Follow instructions from your health care provider about wearing your sleep device: Anytime you are sleeping, including during daytime naps. While taking prescription pain medicines, sleeping medicines, or medicines that make you drowsy. Return to your normal activities as told by your health care provider. Ask your health care provider what activities are safe for you. Take over-the-counter and prescription medicines only as told by your health care provider. Do not use any products that contain nicotine or tobacco. These products include cigarettes, chewing tobacco, and vaping devices, such as e-cigarettes. These can delay incision healing after surgery. If you need help quitting, ask your health care provider. Contact a health care provider if: You have nausea or vomiting that does not get better with medicine. You vomit every time you eat or drink. You have pain that does not get better with medicine. You cannot urinate or have bloody urine. You develop a skin rash. You have a fever. Get help right away if: You have trouble breathing. You have chest pain. You vomit blood. These symptoms may be an emergency. Get help right away. Call 911. Do not wait to see if the symptoms will go away. Do not drive yourself to the hospital. Summary After the procedure, it is common to have a sore throat, hoarseness, nausea, vomiting, or to feel weak, sleepy, or fatigue. For the time period you were told by your health care provider, do not drive or use machinery. Get help right away if you have difficulty breathing, have chest pain, or vomit blood. These symptoms may be an emergency. This information is not intended to replace advice given to you by your health care provider. Make sure you discuss any questions you have with your health care provider. Document Revised: 07/31/2021 Document  Reviewed: 07/31/2021 Elsevier Patient Education  2023 [employer]. How to Use Chlorhexidine Before Surgery Chlorhexidine gluconate (CHG) is a germ-killing (antiseptic) solution that is used to clean the skin. It can get rid of the bacteria that normally live on the skin and can keep them away for about 24 hours. To clean your skin with CHG, you may be given: A CHG solution to use in the shower or as part of a sponge bath. A prepackaged cloth that contains CHG. Cleaning your skin with CHG may help lower the risk for infection: While you are staying in the intensive care unit of the hospital. If you have a vascular access, such as a central line, to provide short-term or long-term access to your veins. If you have a catheter to drain  urine from your bladder. If you are on a ventilator. A ventilator is a machine that helps you breathe by moving air in and out of your lungs. After surgery. What are the risks? Risks of using CHG include: A skin reaction. Hearing loss, if CHG gets in your ears and you have a perforated eardrum. Eye injury, if CHG gets in your eyes and is not rinsed out. The CHG product catching fire. Make sure that you avoid smoking and flames after applying CHG to your skin. Do not use CHG: If you have a chlorhexidine allergy or have previously reacted to chlorhexidine. On babies younger than 75 months of age. How to use CHG solution Use CHG only as told by your health care provider, and follow the instructions on the label. Use the full amount of CHG as directed. Usually, this is one bottle. During a shower Follow these steps when using CHG solution during a shower (unless your health care provider gives you different instructions): Start the shower. Use your normal soap and shampoo to wash your face and hair. Turn off the shower or move out of the shower stream. Pour the CHG onto a clean washcloth. Do not use any type of brush or rough-edged sponge. Starting at your  neck, lather your body down to your toes. Make sure you follow these instructions: If you will be having surgery, pay special attention to the part of your body where you will be having surgery. Scrub this area for at least 1 minute. Do not use CHG on your head or face. If the solution gets into your ears or eyes, rinse them well with water. Avoid your genital area. Avoid any areas of skin that have broken skin, cuts, or scrapes. Scrub your back and under your arms. Make sure to wash skin folds. Let the lather sit on your skin for 1-2 minutes or as long as told by your health care provider. Thoroughly rinse your entire body in the shower. Make sure that all body creases and crevices are rinsed well. Dry off with a clean towel. Do not put any substances on your body afterward--such as powder, lotion, or perfume--unless you are told to do so by your health care provider. Only use lotions that are recommended by the manufacturer. Put on clean clothes or pajamas. If it is the night before your surgery, sleep in clean sheets.  During a sponge bath Follow these steps when using CHG solution during a sponge bath (unless your health care provider gives you different instructions): Use your normal soap and shampoo to wash your face and hair. Pour the CHG onto a clean washcloth. Starting at your neck, lather your body down to your toes. Make sure you follow these instructions: If you will be having surgery, pay special attention to the part of your body where you will be having surgery. Scrub this area for at least 1 minute. Do not use CHG on your head or face. If the solution gets into your ears or eyes, rinse them well with water. Avoid your genital area. Avoid any areas of skin that have broken skin, cuts, or scrapes. Scrub your back and under your arms. Make sure to wash skin folds. Let the lather sit on your skin for 1-2 minutes or as long as told by your health care provider. Using a different  clean, wet washcloth, thoroughly rinse your entire body. Make sure that all body creases and crevices are rinsed well. Dry off with a clean towel. Do  not put any substances on your body afterward--such as powder, lotion, or perfume--unless you are told to do so by your health care provider. Only use lotions that are recommended by the manufacturer. Put on clean clothes or pajamas. If it is the night before your surgery, sleep in clean sheets. How to use CHG prepackaged cloths Only use CHG cloths as told by your health care provider, and follow the instructions on the label. Use the CHG cloth on clean, dry skin. Do not use the CHG cloth on your head or face unless your health care provider tells you to. When washing with the CHG cloth: Avoid your genital area. Avoid any areas of skin that have broken skin, cuts, or scrapes. Before surgery Follow these steps when using a CHG cloth to clean before surgery (unless your health care provider gives you different instructions): Using the CHG cloth, vigorously scrub the part of your body where you will be having surgery. Scrub using a back-and-forth motion for 3 minutes. The area on your body should be completely wet with CHG when you are done scrubbing. Do not rinse. Discard the cloth and let the area air-dry. Do not put any substances on the area afterward, such as powder, lotion, or perfume. Put on clean clothes or pajamas. If it is the night before your surgery, sleep in clean sheets.  For general bathing Follow these steps when using CHG cloths for general bathing (unless your health care provider gives you different instructions). Use a separate CHG cloth for each area of your body. Make sure you wash between any folds of skin and between your fingers and toes. Wash your body in the following order, switching to a new cloth after each step: The front of your neck, shoulders, and chest. Both of your arms, under your arms, and your hands. Your  stomach and groin area, avoiding the genitals. Your right leg and foot. Your left leg and foot. The back of your neck, your back, and your buttocks. Do not rinse. Discard the cloth and let the area air-dry. Do not put any substances on your body afterward--such as powder, lotion, or perfume--unless you are told to do so by your health care provider. Only use lotions that are recommended by the manufacturer. Put on clean clothes or pajamas. Contact a health care provider if: Your skin gets irritated after scrubbing. You have questions about using your solution or cloth. You swallow any chlorhexidine. Call your local poison control center [phone])165-1953 in the [place] Get help right away if: Your eyes itch badly, or they become very red or swollen. Your skin itches badly and is red or swollen. Your hearing changes. You have trouble seeing. You have swelling or tingling in your mouth or throat. You have trouble breathing. These symptoms may represent a serious problem that is an emergency. Do not wait to see if the symptoms will go away. Get medical help right away. Call your local emergency services (911 in the [place] Do not drive yourself to the hospital. Summary Chlorhexidine gluconate (CHG) is a germ-killing (antiseptic) solution that is used to clean the skin. Cleaning your skin with CHG may help to lower your risk for infection. You may be given CHG to use for bathing. It may be in a bottle or in a prepackaged cloth to use on your skin. Carefully follow your health care provider's instructions and the instructions on the product label. Do not use CHG if you have a chlorhexidine allergy. Contact your health care  provider if your skin gets irritated after scrubbing. This information is not intended to replace advice given to you by your health care provider. Make sure you discuss any questions you have with your health care provider. Document Revised: 08/31/2021 Document Reviewed:  07/14/2020 Elsevier Patient Education  2023 [employer].

## 2022-05-07 ENCOUNTER — Encounter (HOSPITAL_COMMUNITY): Payer: Self-pay

## 2022-05-07 ENCOUNTER — Other Ambulatory Visit: Payer: Self-pay

## 2022-05-07 ENCOUNTER — Encounter (HOSPITAL_COMMUNITY)
Admission: RE | Admit: 2022-05-07 | Discharge: 2022-05-07 | Disposition: A | Discharge status: Home / Self Care | Payer: 59 | Source: Ambulatory Visit | Attending: General Surgery | Admitting: General Surgery

## 2022-05-07 VITALS — BP 109/68 | HR 69 | Temp 97.5°F | Resp 14 | Ht 71.0 in | Wt 165.0 lb

## 2022-05-07 DIAGNOSIS — Z01812 Encounter for preprocedural laboratory examination: Secondary | ICD-10-CM | POA: Insufficient documentation

## 2022-05-07 DIAGNOSIS — Z01818 Encounter for other preprocedural examination: Secondary | ICD-10-CM

## 2022-05-07 LAB — TYPE AND SCREEN
ABO/RH(D): A NEG
Antibody Screen: NEGATIVE

## 2022-05-12 ENCOUNTER — Other Ambulatory Visit: Payer: Self-pay

## 2022-05-12 ENCOUNTER — Encounter (HOSPITAL_COMMUNITY)
Admission: RE | Disposition: A | Discharge status: Home / Self Care | Payer: Self-pay | Source: Home / Self Care | Attending: General Surgery

## 2022-05-12 ENCOUNTER — Encounter (HOSPITAL_COMMUNITY): Payer: Self-pay | Admitting: General Surgery

## 2022-05-12 ENCOUNTER — Ambulatory Visit (HOSPITAL_COMMUNITY): Payer: 59 | Admitting: Anesthesiology

## 2022-05-12 ENCOUNTER — Ambulatory Visit (HOSPITAL_COMMUNITY)
Admission: RE | Admit: 2022-05-12 | Discharge: 2022-05-12 | Disposition: A | Discharge status: Home / Self Care | Payer: 59 | Attending: General Surgery | Admitting: General Surgery

## 2022-05-12 ENCOUNTER — Ambulatory Visit (HOSPITAL_BASED_OUTPATIENT_CLINIC_OR_DEPARTMENT_OTHER): Payer: 59 | Admitting: Anesthesiology

## 2022-05-12 DIAGNOSIS — F17210 Nicotine dependence, cigarettes, uncomplicated: Secondary | ICD-10-CM | POA: Insufficient documentation

## 2022-05-12 DIAGNOSIS — K4090 Unilateral inguinal hernia, without obstruction or gangrene, not specified as recurrent: Secondary | ICD-10-CM

## 2022-05-12 DIAGNOSIS — F32A Depression, unspecified: Secondary | ICD-10-CM | POA: Insufficient documentation

## 2022-05-12 DIAGNOSIS — M1990 Unspecified osteoarthritis, unspecified site: Secondary | ICD-10-CM | POA: Insufficient documentation

## 2022-05-12 DIAGNOSIS — F419 Anxiety disorder, unspecified: Secondary | ICD-10-CM | POA: Insufficient documentation

## 2022-05-12 DIAGNOSIS — F418 Other specified anxiety disorders: Secondary | ICD-10-CM | POA: Diagnosis not present

## 2022-05-12 HISTORY — PX: XI ROBOTIC ASSISTED INGUINAL HERNIA REPAIR WITH MESH: SHX6706

## 2022-05-12 SURGERY — REPAIR, HERNIA, INGUINAL, ROBOT-ASSISTED, LAPAROSCOPIC, USING MESH
Anesthesia: General | Site: Abdomen | Laterality: Right

## 2022-05-12 MED ORDER — HYDROMORPHONE HCL 1 MG/ML IJ SOLN
0.2500 mg | INTRAMUSCULAR | Status: AC | PRN
  Administered 2022-05-12 (×4): 0.5 mg via INTRAVENOUS
  Filled 2022-05-12 (×4): qty 0.5

## 2022-05-12 MED ORDER — ONDANSETRON HCL 4 MG/2ML IJ SOLN
INTRAMUSCULAR | Status: AC
  Filled 2022-05-12: qty 2

## 2022-05-12 MED ORDER — ROCURONIUM 10MG/ML (10ML) SYRINGE FOR MEDFUSION PUMP - OPTIME
INTRAVENOUS | Status: DC | PRN
  Administered 2022-05-12: 20 mg via INTRAVENOUS
  Administered 2022-05-12: 40 mg via INTRAVENOUS

## 2022-05-12 MED ORDER — PHENYLEPHRINE HCL (PRESSORS) 10 MG/ML IV SOLN
INTRAVENOUS | Status: DC | PRN
  Administered 2022-05-12: 80 ug via INTRAVENOUS

## 2022-05-12 MED ORDER — ONDANSETRON HCL 4 MG/2ML IJ SOLN
INTRAMUSCULAR | Status: DC | PRN
  Administered 2022-05-12: 4 mg via INTRAVENOUS

## 2022-05-12 MED ORDER — PROPOFOL 10 MG/ML IV BOLUS
INTRAVENOUS | Status: DC | PRN
  Administered 2022-05-12: 60 mg via INTRAVENOUS
  Administered 2022-05-12: 200 mg via INTRAVENOUS

## 2022-05-12 MED ORDER — PROPOFOL 10 MG/ML IV BOLUS
INTRAVENOUS | Status: AC
  Filled 2022-05-12: qty 20

## 2022-05-12 MED ORDER — CHLORHEXIDINE GLUCONATE CLOTH 2 % EX PADS: 6.0000 | MEDICATED_PAD | Freq: Once | CUTANEOUS | Status: DC

## 2022-05-12 MED ORDER — KETOROLAC TROMETHAMINE 30 MG/ML IJ SOLN
30.0000 mg | Freq: Once | INTRAMUSCULAR | Status: AC
  Administered 2022-05-12: 30 mg via INTRAVENOUS
  Filled 2022-05-12: qty 1

## 2022-05-12 MED ORDER — CEFAZOLIN SODIUM-DEXTROSE 2-4 GM/100ML-% IV SOLN
2.0000 g | INTRAVENOUS | Status: AC
  Administered 2022-05-12: 2 g via INTRAVENOUS
  Filled 2022-05-12: qty 100

## 2022-05-12 MED ORDER — DEXAMETHASONE SODIUM PHOSPHATE 10 MG/ML IJ SOLN
INTRAMUSCULAR | Status: AC
  Filled 2022-05-12: qty 1

## 2022-05-12 MED ORDER — ROCURONIUM BROMIDE 10 MG/ML (PF) SYRINGE
PREFILLED_SYRINGE | INTRAVENOUS | Status: AC
  Filled 2022-05-12: qty 30

## 2022-05-12 MED ORDER — FENTANYL CITRATE (PF) 250 MCG/5ML IJ SOLN
INTRAMUSCULAR | Status: AC
  Filled 2022-05-12: qty 5

## 2022-05-12 MED ORDER — FENTANYL CITRATE (PF) 100 MCG/2ML IJ SOLN
INTRAMUSCULAR | Status: DC | PRN
  Administered 2022-05-12 (×2): 50 ug via INTRAVENOUS

## 2022-05-12 MED ORDER — BUPIVACAINE LIPOSOME 1.3 % IJ SUSP
INTRAMUSCULAR | Status: DC | PRN
  Administered 2022-05-12: 14 mL

## 2022-05-12 MED ORDER — METHOCARBAMOL 750 MG PO TABS: 750.0000 mg | ORAL_TABLET | Freq: Three times a day (TID) | ORAL | 1 refills | Status: DC | PRN

## 2022-05-12 MED ORDER — MEPERIDINE HCL 50 MG/ML IJ SOLN: 6.2500 mg | INTRAMUSCULAR | Status: DC | PRN

## 2022-05-12 MED ORDER — LACTATED RINGERS IV SOLN: INTRAVENOUS | Status: DC

## 2022-05-12 MED ORDER — BUPIVACAINE LIPOSOME 1.3 % IJ SUSP
INTRAMUSCULAR | Status: AC
  Filled 2022-05-12: qty 20

## 2022-05-12 MED ORDER — PHENYLEPHRINE 80 MCG/ML (10ML) SYRINGE FOR IV PUSH (FOR BLOOD PRESSURE SUPPORT)
PREFILLED_SYRINGE | INTRAVENOUS | Status: AC
  Filled 2022-05-12: qty 10

## 2022-05-12 MED ORDER — LIDOCAINE HCL (CARDIAC) PF 50 MG/5ML IV SOSY
PREFILLED_SYRINGE | INTRAVENOUS | Status: DC | PRN
  Administered 2022-05-12: 100 mg via INTRAVENOUS

## 2022-05-12 MED ORDER — ONDANSETRON HCL 4 MG/2ML IJ SOLN: 4.0000 mg | Freq: Once | INTRAMUSCULAR | Status: DC | PRN

## 2022-05-12 MED ORDER — SUGAMMADEX SODIUM 200 MG/2ML IV SOLN
INTRAVENOUS | Status: DC | PRN
  Administered 2022-05-12: 200 mg via INTRAVENOUS

## 2022-05-12 MED ORDER — MIDAZOLAM HCL 2 MG/2ML IJ SOLN
INTRAMUSCULAR | Status: AC
  Filled 2022-05-12: qty 2

## 2022-05-12 MED ORDER — OXYCODONE HCL 5 MG PO TABS: 5.0000 mg | ORAL_TABLET | ORAL | 0 refills | Status: DC | PRN

## 2022-05-12 MED ORDER — CHLORHEXIDINE GLUCONATE 0.12 % MT SOLN
15.0000 mL | Freq: Once | OROMUCOSAL | Status: AC
  Administered 2022-05-12: 15 mL via OROMUCOSAL
  Filled 2022-05-12: qty 15

## 2022-05-12 MED ORDER — STERILE WATER FOR IRRIGATION IR SOLN
Status: DC | PRN
  Administered 2022-05-12: 1000 mL

## 2022-05-12 MED ORDER — ORAL CARE MOUTH RINSE: 15.0000 mL | Freq: Once | OROMUCOSAL | Status: AC

## 2022-05-12 MED ORDER — LIDOCAINE HCL (PF) 2 % IJ SOLN
INTRAMUSCULAR | Status: AC
  Filled 2022-05-12: qty 5

## 2022-05-12 SURGICAL SUPPLY — 51 items
ADH SKN CLS APL DERMABOND .7 (GAUZE/BANDAGES/DRESSINGS) ×1
APL PRP STRL LF DISP 70% ISPRP (MISCELLANEOUS) ×1
APL SWBSTK 6 STRL LF DISP (MISCELLANEOUS) ×1
APPLICATOR COTTON TIP 6 STRL (MISCELLANEOUS) IMPLANT
APPLICATOR COTTON TIP 6IN STRL (MISCELLANEOUS) ×1
CHLORAPREP W/TINT 26 (MISCELLANEOUS) ×1 IMPLANT
COVER MAYO STAND STRL (DRAPES) ×1 IMPLANT
COVER TIP SHEARS 8 DVNC (MISCELLANEOUS) ×1 IMPLANT
COVER TIP SHEARS 8MM DA VINCI (MISCELLANEOUS) ×1
DEFOGGER SCOPE WARMER CLEARIFY (MISCELLANEOUS) IMPLANT
DERMABOND ADVANCED .7 DNX12 (GAUZE/BANDAGES/DRESSINGS) ×1 IMPLANT
DRAPE ARM DVNC X/XI (DISPOSABLE) ×3 IMPLANT
DRAPE COLUMN DVNC XI (DISPOSABLE) ×1 IMPLANT
DRAPE DA VINCI XI ARM (DISPOSABLE) ×3
DRAPE DA VINCI XI COLUMN (DISPOSABLE) ×1
DRAPE HALF SHEET 40X57 (DRAPES) ×1 IMPLANT
ELECT REM PT RETURN 9FT ADLT (ELECTROSURGICAL) ×1
ELECTRODE REM PT RTRN 9FT ADLT (ELECTROSURGICAL) ×1 IMPLANT
GAUZE SPONGE 4X4 12PLY STRL (GAUZE/BANDAGES/DRESSINGS) ×1 IMPLANT
GLOVE BIOGEL PI IND STRL 6 (GLOVE) IMPLANT
GLOVE BIOGEL PI IND STRL 6.5 (GLOVE) IMPLANT
GLOVE BIOGEL PI IND STRL 7.0 (GLOVE) ×1 IMPLANT
GLOVE SURG SS PI 7.5 STRL IVOR (GLOVE) ×2 IMPLANT
GOWN STRL REUS W/TWL LRG LVL3 (GOWN DISPOSABLE) ×2 IMPLANT
KIT PINK PAD W/HEAD ARE REST (MISCELLANEOUS) ×1
KIT PINK PAD W/HEAD ARM REST (MISCELLANEOUS) ×1 IMPLANT
KIT TURNOVER KIT A (KITS) ×1 IMPLANT
MESH 3DMAX MID 4X6 RT LRG (Mesh General) IMPLANT
NDL HYPO 21X1.5 SAFETY (NEEDLE) ×1 IMPLANT
NDL INSUFFLATION 14GA 120MM (NEEDLE) ×1 IMPLANT
NEEDLE HYPO 21X1.5 SAFETY (NEEDLE) ×1 IMPLANT
NEEDLE INSUFFLATION 14GA 120MM (NEEDLE) ×1 IMPLANT
OBTURATOR OPTICAL STANDARD 8MM (TROCAR) ×1
OBTURATOR OPTICAL STND 8 DVNC (TROCAR) ×1
OBTURATOR OPTICALSTD 8 DVNC (TROCAR) ×1 IMPLANT
PACK LAP CHOLE LZT030E (CUSTOM PROCEDURE TRAY) ×1 IMPLANT
PENCIL SMOKE EVACUATOR (MISCELLANEOUS) IMPLANT
SEAL CANN UNIV 5-8 DVNC XI (MISCELLANEOUS) ×3 IMPLANT
SEAL XI 5MM-8MM UNIVERSAL (MISCELLANEOUS) ×3
SET BASIN LINEN APH (SET/KITS/TRAYS/PACK) ×1 IMPLANT
SET TUBE SMOKE EVAC HIGH FLOW (TUBING) ×1 IMPLANT
SUT MNCRL AB 4-0 PS2 18 (SUTURE) ×1 IMPLANT
SUT V-LOC 90 ABS 3-0 VLT  V-20 (SUTURE) ×1
SUT V-LOC 90 ABS 3-0 VLT V-20 (SUTURE) ×2 IMPLANT
SUT VIC AB 2-0 SH 27 (SUTURE) ×1
SUT VIC AB 2-0 SH 27X BRD (SUTURE) ×1 IMPLANT
SYR 20ML LL LF (SYRINGE) ×1 IMPLANT
TAPE TRANSPORE STRL 2 31045 (GAUZE/BANDAGES/DRESSINGS) ×1 IMPLANT
TRAY FOL W/BAG SLVR 16FR STRL (SET/KITS/TRAYS/PACK) ×1 IMPLANT
TRAY FOLEY W/BAG SLVR 16FR LF (SET/KITS/TRAYS/PACK) ×1
WATER STERILE IRR 1000ML POUR (IV SOLUTION) IMPLANT

## 2022-05-12 NOTE — Interval H&P Note (Signed)
History and Physical Interval Note:  05/12/2022 7:17 AM  Howard [name]  has presented today for surgery, with the diagnosis of RIGHT INGUINAL HERNIA.  The various methods of treatment have been discussed with the patient and family. After consideration of risks, benefits and other options for treatment, the patient has consented to  Procedure(s): XI ROBOTIC ASSISTED INGUINAL HERNIA REPAIR WITH MESH, RIGHT, POSSIBLE BILATERAL (Right) as a surgical intervention.  The patient's history has been reviewed, patient examined, no change in status, stable for surgery.  I have reviewed the patient's chart and labs.  Questions were answered to the patient's satisfaction.     Franky Macho

## 2022-05-12 NOTE — Anesthesia Preprocedure Evaluation (Signed)
Anesthesia Evaluation  Patient identified by MRN, date of birth, ID band Patient awake    Reviewed: Allergy & Precautions, H&P , NPO status , Patient's Chart, lab work & pertinent test results  Airway Mallampati: II  TM Distance: >3 FB Neck ROM: Full    Dental  (+) Dental Advisory Given,    Pulmonary Current Smoker and Patient abstained from smoking.   Pulmonary exam normal breath sounds clear to auscultation       Cardiovascular Exercise Tolerance: Good Normal cardiovascular exam Rhythm:Regular Rate:Normal  Corky Crafts, MD 05/16/2020 11:05 PM EST   Normal LV function.  Dilated aorta.  Bicuspid aortic valve.  Will need f/u of aorta size.  Plan for CTA chest to eval aorta in 6 months.  Corky Crafts, MD 12/24/2021  5:15 PM EDT   stable 4.2 cm ascending thoracic aortic aneurysm. Recommend f/u study in one year.  Continue BP control.      Neuro/Psych  PSYCHIATRIC DISORDERS Anxiety Depression    negative neurological ROS     GI/Hepatic negative GI ROS,,,(+)     substance abuse (abuse)  alcohol use, Hepatitis - (high LFTs)  Endo/Other  negative endocrine ROS    Renal/GU negative Renal ROS  negative genitourinary   Musculoskeletal  (+) Arthritis , Osteoarthritis,    Abdominal   Peds negative pediatric ROS (+)  Hematology  (+) Blood dyscrasia (high ferritin levels)   Anesthesia Other Findings   Reproductive/Obstetrics negative OB ROS                             Anesthesia Physical Anesthesia Plan  ASA: 3  Anesthesia Plan: General   Post-op Pain Management: Dilaudid IV   Induction: Intravenous  PONV Risk Score and Plan: 3 and Ondansetron and Dexamethasone  Airway Management Planned: Oral ETT  Additional Equipment:   Intra-op Plan:   Post-operative Plan: Extubation in OR  Informed Consent: I have reviewed the patients History and Physical, chart, labs and  discussed the procedure including the risks, benefits and alternatives for the proposed anesthesia with the patient or authorized representative who has indicated his/her understanding and acceptance.     Dental advisory given  Plan Discussed with: CRNA and Surgeon  Anesthesia Plan Comments: (Risk of liver function getting worse was discussed with patient and Dr. Lovell Sheehan)        Anesthesia Quick Evaluation

## 2022-05-12 NOTE — Anesthesia Postprocedure Evaluation (Signed)
Anesthesia Post Note  Patient: Howard [name]  Procedure(s) Performed: XI ROBOTIC ASSISTED INGUINAL HERNIA REPAIR WITH MESH, RIGHT (Right: Abdomen)  Patient location during evaluation: Phase II Anesthesia Type: General Level of consciousness: awake and alert and oriented Pain management: pain level controlled Vital Signs Assessment: post-procedure vital signs reviewed and stable Respiratory status: spontaneous breathing, nonlabored ventilation and respiratory function stable Cardiovascular status: blood pressure returned to baseline and stable Postop Assessment: no apparent nausea or vomiting Anesthetic complications: no  No notable events documented.   Last Vitals:  Vitals:   05/12/22 0953 05/12/22 1000  BP: 127/75 126/74  Pulse: 73 66  Resp: 12 (!) 27  Temp: 36.6 C   SpO2: 96% 95%    Last Pain:  Vitals:   05/12/22 1037  TempSrc:   PainSc: Asleep                 Aspyn Warnke C Dannis Deroche

## 2022-05-12 NOTE — Transfer of Care (Signed)
Immediate Anesthesia Transfer of Care Note  Patient: Howard [name] Sem  Procedure(s) Performed: XI ROBOTIC ASSISTED INGUINAL HERNIA REPAIR WITH MESH, RIGHT (Right: Abdomen)  Patient Location: PACU  Anesthesia Type:General  Level of Consciousness: awake  Airway & Oxygen Therapy: Patient Spontanous Breathing  Post-op Assessment: Report given to RN  Post vital signs: Reviewed  Last Vitals:  Vitals Value Taken Time  BP 127/75 05/12/22 0951  Temp    Pulse 70 05/12/22 0955  Resp 11 05/12/22 0955  SpO2 95 % 05/12/22 0955  Vitals shown include unvalidated device data.  Last Pain:  Vitals:   05/12/22 0622  TempSrc: Oral  PainSc: 0-No pain         Complications: No notable events documented.

## 2022-05-12 NOTE — Anesthesia Procedure Notes (Signed)
Procedure Name: Intubation Date/Time: 05/12/2022 7:42 AM  Performed by: Ollen Bowl, CRNAPre-anesthesia Checklist: Patient identified, Patient being monitored, Timeout performed, Emergency Drugs available and Suction available Patient Re-evaluated:Patient Re-evaluated prior to induction Oxygen Delivery Method: Circle system utilized Preoxygenation: Pre-oxygenation with 100% oxygen Induction Type: IV induction Ventilation: Mask ventilation without difficulty Laryngoscope Size: Mac and 3 Grade View: Grade I Tube type: Oral Tube size: 7.5 mm Number of attempts: 1 Airway Equipment and Method: Stylet Placement Confirmation: ETT inserted through vocal cords under direct vision, positive ETCO2 and breath sounds checked- equal and bilateral Secured at: 24 cm Tube secured with: Tape Dental Injury: Teeth and Oropharynx as per pre-operative assessment

## 2022-05-12 NOTE — Op Note (Signed)
Patient:  Howard [name]  DOB:  [birth year]  MRN:  [id number]   Preop Diagnosis: Right inguinal hernia  Postop Diagnosis: Same  Procedure: Robotic assisted laparoscopic right inguinal herniorrhaphy with mesh  Surgeon: Franky Macho, MD  Anes: General endotracheal  Indications: Patient is a 55 year old white male who presents with a symptomatic right inguinal hernia.  The risks and benefits of the procedure including bleeding, infection, bowel injury, the possibility of an open procedure, and the possibility of recurrence of the hernia were fully explained to the patient, who gave informed consent.  Procedure note: The patient was placed in supine position.  After induction of general endotracheal anesthesia, the abdomen was prepped and draped using usual sterile technique with ChloraPrep.  Surgical site confirmation was performed.  A small incision was made supraumbilical and a Veress needle was introduced into the abdominal cavity.  Confirmation of placement was done using the saline drop test.  The abdomen was then insufflated to 15 mmHg pressure.  An 8 mm trocar was introduced in the supraumbilical region under direct visualization without difficulty.  Additional 8 mm trocars were placed the right upper quadrant and left upper quadrant regions.  The patient was placed in Trendelenburg position.  The camera was then inserted.  An obvious right inguinal hernia was noted which was indirect in nature.  No significant left inguinal hernia was noted.  The robot was then targeted and docked.  A flap was then made along the peritoneum down to the right inguinal region.  It was carried 2 cm past midline.  Cooper's ligament was fully visualized.  The lateral portion of the flap was also created.  The indirect hernia sac was freed away from the spermatic cord.  Care was taken to avoid the vessels and vas deferens.  Once I had adequate dissection, and large size Bard 3D max medium weight right-sided mesh  was placed.  It was secured to Cooper's ligament using a 2-0 Vicryl suture.  I then closed the flap using a 3-0 V lock 90 running suture.  The abdomen was then deflated in order to get the peritoneum to adhesed to the abdominal wall.  A single Vicryl suture was used to close a small hole in the anterior portion of the peritoneal flap.  The robot was then undocked.  All air was then evacuated from the abdominal cavity prior to removal of the trocars.  The incisions were injected with Exparel.  A bleeding was controlled using Bovie electrocautery.  All incisions were closed using a 4-0 Monocryl subcuticular suture.  Dermabond was applied.  All tape and needle counts were correct at the end of the procedure.  Patient was extubated in the operating room and transferred to PACU in stable condition.  Complications: None  EBL: Minimal  Specimen: None

## 2022-05-12 NOTE — Progress Notes (Signed)
Wife called stating saline lock present in L arm. Does not want to bring patient in to have it removed as he is sleeping. Instructed verbally over the phone how to remove saline lock. Verbalized understanding.

## 2022-05-19 ENCOUNTER — Encounter (HOSPITAL_COMMUNITY): Payer: Self-pay | Admitting: General Surgery

## 2022-05-20 ENCOUNTER — Encounter: Payer: Self-pay | Admitting: General Surgery

## 2022-05-20 ENCOUNTER — Ambulatory Visit (INDEPENDENT_AMBULATORY_CARE_PROVIDER_SITE_OTHER): Payer: 59 | Admitting: General Surgery

## 2022-05-20 DIAGNOSIS — Z09 Encounter for follow-up examination after completed treatment for conditions other than malignant neoplasm: Secondary | ICD-10-CM

## 2022-05-20 DIAGNOSIS — K4090 Unilateral inguinal hernia, without obstruction or gangrene, not specified as recurrent: Secondary | ICD-10-CM

## 2022-05-20 NOTE — Progress Notes (Signed)
Subjective:     Howard [name]  Telephone postoperative visit performed with patient.  He is status post a right robotic laparoscopic assisted inguinal herniorrhaphy with mesh.  He states he is doing great.  He is very pleased with the results.  He is already starting to return to his normal activity. Objective:    There were no vitals taken for this visit.  General:  alert and cooperative       Assessment:    Doing well postoperatively.    Plan:   May increase activity as able.  Follow-up here as needed.  As this was a postoperative telephone visit, this was not billable as this is part of the global surgical fee.  Total telephone time was 3 minutes.

## 2022-05-24 ENCOUNTER — Ambulatory Visit (HOSPITAL_COMMUNITY)
Admission: RE | Admit: 2022-05-24 | Discharge: 2022-05-24 | Disposition: A | Discharge status: Home / Self Care | Payer: 59 | Source: Ambulatory Visit | Attending: Internal Medicine | Admitting: Internal Medicine

## 2022-05-24 DIAGNOSIS — R7989 Other specified abnormal findings of blood chemistry: Secondary | ICD-10-CM | POA: Diagnosis present

## 2022-05-24 MED ORDER — GADOBUTROL 1 MMOL/ML IV SOLN
7.5000 mL | Freq: Once | INTRAVENOUS | Status: AC | PRN
  Administered 2022-05-24: 7.5 mL via INTRAVENOUS

## 2022-05-31 NOTE — Progress Notes (Unsigned)
[facility] [address] [place], [place] [id number]   CLINIC:  Medical Oncology/Hematology  CONSULT NOTE  Patient Care Team: Elfredia NevinsFusco, Lawrence, MD as PCP - General (Internal Medicine) Corky CraftsVaranasi, Jayadeep S, MD as PCP - Cardiology (Cardiology) Jena Gaussourk, Gerrit Friendsobert M, MD as Consulting Physician (Gastroenterology)  CHIEF COMPLAINTS/PURPOSE OF CONSULTATION:  Hemochromatosis   HISTORY OF PRESENTING ILLNESS:   Howard [name] 57 y.o. male is here at the request of Dr. Marletta Lorarver (gastroenterology) for further evaluation and management of hemochromatosis.  Patient follows with gastroenterology for transaminitis, fatty liver disease, and prior alcohol use (sober for 4+ years). Patient also recently had laparoscopic right inguinal hernia repair (05/12/2022) by Dr. Lovell SheehanJenkins.  Workup of transaminitis revealed significantly abnormal iron studies (04/07/2022) the with ferritin 2828, iron saturation 95%.  Hemochromatosis DNA testing revealed C282Y homozygosity.  MRI abdomen (05/24/2022) consistent with severe diffuse hepatic iron deposition disease, without evidence of iron deposition within the spleen, pancreas, or bone marrow.  Patient reports severe joint pain and history of psoriatic arthritis.  He reports moderate fatigue with energy 50 to 60%.  He denies any abdominal pain or skin changes.  He reports good appetite.  He denies any history of cardiac arrhythmia or congestive heart failure, but follows with cardiology due to bicuspid aorta and thoracic aortic aneurysm.  He reports memory issues and "mental fog."  Patient eats occasional red meat.  He does not cook in cast iron skillet.  Prior history of heavy alcohol use, but has been sober since 2019.  He denies any family history of hemochromatosis but notes that his mother had severe arthritis.  Past medical history significant for psoriatic arthritis (on chronic methotrexate), fatty liver disease, history of alcoholism, GERD, chronic pain,  bicuspid aorta with thoracic aortic aneurysm, anxiety/depression, osteoarthritis.  Last colonoscopy was in 2021, with recommendations for repeat colonoscopy in 10 years.  Patient lives at home with his wife.  He is currently on disability due to his psoriatic arthritis, but previously worked in [employer].  He has a history of alcoholism (drank heavily from age 57 to age 57), but has been sober since 2019.  He also reports history of illicit drug use (cocaine, marijuana, opioid pills, fentanyl), but has been clean for the past 4 years.  He is a current active smoker, 1 PPD since age 57.  No family history of hemochromatosis, blood problems, or cancer.  MEDICAL HISTORY:  Past Medical History:  Diagnosis Date   Alcohol abuse    Anxiety    Chronic back pain    Chronic pain    Depression    Fall    Fracture of right elbow    Osteoarthritis    Psoriasis     SURGICAL HISTORY: Past Surgical History:  Procedure Laterality Date   APPENDECTOMY     COLONOSCOPY WITH PROPOFOL N/A 06/07/2019   diverticulosis, otherwise normal. Non-bleeding internal hemorrhoids.    FOOT SURGERY     per patient: "neurofibroma excision"   LUMBAR LAMINECTOMY/DECOMPRESSION MICRODISCECTOMY Bilateral 12/31/2015   Procedure: LUMBER DECOMPRESSION L4-5/ L5-S1 BILATERALLY  ;  Surgeon: Jene EveryJeffrey Beane, MD;  Location: [facility];  Service: Orthopedics;  Laterality: Bilateral;   REVERSE SHOULDER ARTHROPLASTY Right 10/24/2018   REVERSE SHOULDER ARTHROPLASTY Right 10/24/2018   Procedure: REVERSE SHOULDER ARTHROPLASTY;  Surgeon: Yolonda Kidaogers, Jason Patrick, MD;  [facility];  Service: Orthopedics;  Laterality: Right;   SHOULDER SURGERY Right    TOTAL HIP ARTHROPLASTY Right    TOTAL HIP ARTHROPLASTY Left 08/27/2014  Procedure: LEFT TOTAL HIP ARTHROPLASTY ANTERIOR APPROACH;  Surgeon: Durene Romans, MD;  Location: [facility];  Service: Orthopedics;  Laterality: Left;   XI ROBOTIC ASSISTED INGUINAL HERNIA REPAIR WITH MESH Right 05/12/2022    Procedure: XI ROBOTIC ASSISTED INGUINAL HERNIA REPAIR WITH MESH, RIGHT;  Surgeon: Franky Macho, MD;  Location: [facility];  Service: General;  Laterality: Right;    SOCIAL HISTORY: Social History   Socioeconomic History   Marital status: Married    Spouse name: Not on file   Number of children: Not on file   Years of education: Not on file   Highest education level: Not on file  Occupational History   Not on file  Tobacco Use   Smoking status: Every Day    Packs/day: 1.00    Years: 32.00    Total pack years: 32.00    Types: Cigarettes    Passive exposure: Current   Smokeless tobacco: Never  Vaping Use   Vaping Use: Never used  Substance and Sexual Activity   Alcohol use: Not Currently    Alcohol/week: 16.0 standard drinks of alcohol    Types: 6 Glasses of wine, 10 Cans of beer per week    Comment: None currently (last ETOH 03/01/2018); previously drinks 3 to 4 beers daily and 1 glass of wine; "way more" on the weekend   Drug use: No    Comment: hx of marijuana use years ago    Sexual activity: Not on file  Other Topics Concern   Not on file  Social History Narrative   Not on file   Social Determinants of Health   Financial Resource Strain: Not on file  Food Insecurity: Not on file  Transportation Needs: Not on file  Physical Activity: Not on file  Stress: Not on file  Social Connections: Not on file  Intimate Partner Violence: Not on file    FAMILY HISTORY: Family History  Problem Relation Age of Onset   Heart attack Mother    Colon cancer Neg Hx     ALLERGIES:  has No Known Allergies.  MEDICATIONS:  Current Outpatient Medications  Medication Sig Dispense Refill   ALPRAZolam (XANAX) 0.5 MG tablet Take 0.5 mg by mouth 2 (two) times daily as needed for anxiety.     amoxicillin (AMOXIL) 500 MG capsule Take 2,000 mg by mouth See admin instructions. Take 2000 mg by mouth 1 hour before dental appointment for pre-med.     amphetamine-dextroamphetamine (ADDERALL) 20  MG tablet Take 20 mg by mouth 2 (two) times daily.     cholecalciferol (VITAMIN D3) 25 MCG (1000 UNIT) tablet Take 1,000 Units by mouth daily.     DULoxetine (CYMBALTA) 30 MG capsule Take 30 mg by mouth daily.     DULoxetine (CYMBALTA) 60 MG capsule Take 60 mg by mouth daily.     folic acid (FOLVITE) 1 MG tablet Take 1 mg by mouth daily.     Golimumab (SIMPONI ARIA IV) Inject 1 Dose into the vein every 8 (eight) weeks.     ibuprofen (ADVIL) 800 MG tablet Take 800 mg by mouth every 8 (eight) hours as needed for moderate pain.     levothyroxine (SYNTHROID, LEVOTHROID) 25 MCG tablet Take 25 mcg by mouth daily before breakfast.      magnesium oxide (MAG-OX) 400 (240 Mg) MG tablet Take 400 mg by mouth daily.     methocarbamol (ROBAXIN-750) 750 MG tablet Take 1 tablet (750 mg total) by mouth every 8 (eight) hours as needed for  muscle spasms. 30 tablet 1   Methotrexate Sodium (METHOTREXATE, PF,) 50 MG/2ML injection Inject 0.7 mLs into the muscle once a week.     omeprazole (PRILOSEC) 20 MG capsule Take 20 mg by mouth daily as needed (acid reflux).     OVER THE COUNTER MEDICATION Take 1 tablet by mouth daily. Mega Mens     oxyCODONE (ROXICODONE) 5 MG immediate release tablet Take 1 tablet (5 mg total) by mouth every 4 (four) hours as needed. 30 tablet 0   predniSONE (DELTASONE) 5 MG tablet Take 5-10 mg by mouth daily as needed (arthritis).     rosuvastatin (CRESTOR) 5 MG tablet Take 1 tablet (5 mg total) by mouth daily. 90 tablet 3   tamsulosin (FLOMAX) 0.4 MG CAPS capsule Take 0.4 mg by mouth daily.     varenicline (CHANTIX) 1 MG tablet Take 1 mg by mouth daily.     No current facility-administered medications for this visit.    REVIEW OF SYSTEMS:    Review of Systems  Constitutional:  Positive for fatigue. Negative for appetite change, chills, diaphoresis, fever and unexpected weight change.  HENT:   Negative for lump/mass and nosebleeds.   Eyes:  Negative for eye problems.  Respiratory:   Negative for cough, hemoptysis and shortness of breath.   Cardiovascular:  Negative for chest pain, leg swelling and palpitations.  Gastrointestinal:  Positive for nausea. Negative for abdominal pain, blood in stool, constipation, diarrhea and vomiting.  Genitourinary:  Negative for hematuria.   Musculoskeletal:  Positive for arthralgias.  Skin: Negative.   Neurological:  Negative for dizziness, headaches and light-headedness.  Hematological:  Does not bruise/bleed easily.  Psychiatric/Behavioral:  Positive for depression. The patient is nervous/anxious.       PHYSICAL EXAMINATION:   ECOG PERFORMANCE STATUS: 1 - Symptomatic but completely ambulatory  There were no vitals filed for this visit. There were no vitals filed for this visit.  Physical Exam Constitutional:      Appearance: Normal appearance.  HENT:     Head: Normocephalic and atraumatic.     Mouth/Throat:     Mouth: Mucous membranes are moist.  Eyes:     Extraocular Movements: Extraocular movements intact.     Pupils: Pupils are equal, round, and reactive to light.  Cardiovascular:     Rate and Rhythm: Normal rate and regular rhythm.     Pulses: Normal pulses.     Heart sounds: Normal heart sounds.  Pulmonary:     Effort: Pulmonary effort is normal.     Breath sounds: Normal breath sounds.  Abdominal:     General: Bowel sounds are normal.     Palpations: Abdomen is soft.     Tenderness: There is no abdominal tenderness.  Musculoskeletal:        General: No swelling.     Right lower leg: No edema.     Left lower leg: No edema.  Lymphadenopathy:     Cervical: No cervical adenopathy.  Skin:    General: Skin is warm and dry.  Neurological:     General: No focal deficit present.     Mental Status: He is alert and oriented to person, place, and time.  Psychiatric:        Mood and Affect: Mood normal.        Behavior: Behavior normal.       LABORATORY DATA:  I have reviewed the data as listed Recent Results  (from the past 2160 hour(s))  Lipid Profile  Status: None   Collection Time: 03/11/22  9:05 AM  Result Value Ref Range   Cholesterol, Total 117 100 - 199 mg/dL   Triglycerides 70 0 - 149 mg/dL   HDL 55 >16>39 mg/dL   VLDL Cholesterol Cal 15 5 - 40 mg/dL   LDL Chol Calc (NIH) 47 0 - 99 mg/dL   Chol/HDL Ratio 2.1 0.0 - 5.0 ratio    Comment:                                   T. Chol/HDL Ratio                                             Men  Women                               1/2 Avg.Risk  3.4    3.3                                   Avg.Risk  5.0    4.4                                2X Avg.Risk  9.6    7.1                                3X Avg.Risk 23.4   11.0   Hepatic function panel     Status: Abnormal   Collection Time: 03/11/22  9:05 AM  Result Value Ref Range   Total Protein 5.7 (L) 6.0 - 8.5 g/dL   Albumin 4.2 3.8 - 4.9 g/dL   Bilirubin Total 0.7 0.0 - 1.2 mg/dL   Bilirubin, Direct 1.090.21 0.00 - 0.40 mg/dL   Alkaline Phosphatase 103 44 - 121 IU/L   AST 58 (H) 0 - 40 IU/L   ALT 59 (H) 0 - 44 IU/L  Alpha-1-antitrypsin     Status: None   Collection Time: 04/07/22  7:56 AM  Result Value Ref Range   A-1 Antitrypsin, Ser 100 83 - 199 mg/dL  Iron, TIBC and Ferritin Panel     Status: Abnormal   Collection Time: 04/07/22  7:56 AM  Result Value Ref Range   Iron 182 (H) 50 - 180 mcg/dL   TIBC 604192 (L) 540250 - 981425 mcg/dL (calc)   %SAT 95 (H) 20 - 48 % (calc)   Ferritin 2,828 (H) 38 - 380 ng/mL    Comment: Verified by repeat analysis. .   Protime-INR     Status: None   Collection Time: 04/07/22  7:56 AM  Result Value Ref Range   INR 1.0     Comment: Reference Range                     0.9-1.1 Moderate-intensity Warfarin Therapy 2.0-3.0 Higher-intensity Warfarin Therapy   3.0-4.0  .    Prothrombin Time 10.9 9.0 - 11.5 sec    Comment: For additional information, please refer to http://education.quest[facility].com/faq/FAQ104 (This link is being provided for  informational/ educational purposes only.)  Anti-smooth muscle antibody, IgG     Status: None   Collection Time: 04/07/22  7:56 AM  Result Value Ref Range   Actin (Smooth Muscle) Antibody (IGG) <20 <20 U    Comment: . Reference Range:    <20 U: Negative >or=20 U: Positive . Marland Kitchen Antibodies recognizing actin are the main component of smooth muscle antibodies associated with auto- immune liver disease. Actin antibodies are found in approximately 75% of patients with autoimmune hepatitis (AIH) type 1, approximately 65% of patients with autoimmune cholangitis, approximately 30% of patients with primary biliary cirrhosis and approximately 2% of healthy controls. High values are closely correlated with AIH type 1. .   IgG, IgA, IgM     Status: Abnormal   Collection Time: 04/07/22  7:56 AM  Result Value Ref Range   Immunoglobulin A 101 47 - 310 mg/dL   IgG (Immunoglobin G), Serum 412 (L) 600 - 1,640 mg/dL   IgM, Serum 70 50 - 353 mg/dL  Tissue Transglutaminase Abs,IgG,IgA     Status: None   Collection Time: 04/07/22  7:56 AM  Result Value Ref Range   (tTG) Ab, IgG <1.0 U/mL    Comment: Value          Interpretation -----          -------------- <15.0          Antibody not detected > or = 15.0    Antibody detected .    (tTG) Ab, IgA <1.0 U/mL    Comment: Value          Interpretation -----          -------------- <15.0          Antibody not detected > or = 15.0    Antibody detected .   Antinuclear Antib (ANA)     Status: Abnormal   Collection Time: 04/07/22  7:56 AM  Result Value Ref Range   Anti Nuclear Antibody (ANA) POSITIVE (A) NEGATIVE    Comment: ANA IFA is a first line screen for detecting the presence of up to approximately 150 autoantibodies in various autoimmune diseases. A positive ANA IFA result is suggestive of autoimmune disease and reflexes to titer and pattern. Further laboratory testing may be considered if clinically indicated. . For additional  information, please refer to http://education.[facility].com/faq/FAQ177 (This link is being provided for informational/ educational purposes only.) .   Hepatitis B Surface AntiBODY     Status: None   Collection Time: 04/07/22  7:56 AM  Result Value Ref Range   Hep B S Ab NON-REACTIVE NON-REACTIVE  Hepatitis B Surface AntiGEN     Status: None   Collection Time: 04/07/22  7:56 AM  Result Value Ref Range   Hepatitis B Surface Ag NON-REACTIVE NON-REACTIVE    Comment: . For additional information, please refer to  http://education.[facility].com/faq/FAQ202  (This link is being provided for informational/ educational purposes only.) .   Hepatitis B Core Antibody, total     Status: None   Collection Time: 04/07/22  7:56 AM  Result Value Ref Range   Hep B Core Total Ab NON-REACTIVE NON-REACTIVE    Comment: . For additional information, please refer to  http://education.[facility].com/faq/FAQ202  (This link is being provided for informational/ educational purposes only.) .   Hepatitis A Ab, Total     Status: None   Collection Time: 04/07/22  7:56 AM  Result Value Ref Range   Hepatitis A AB,Total NON-REACTIVE NON-REACTIVE    Comment: . For additional information, please refer to  http://education.[facility]diagn[facility].com/faq/FAQ202  (This link is being provided for informational/ educational purposes only.) .   Hepatitis C Antibody     Status: None   Collection Time: 04/07/22  7:56 AM  Result Value Ref Range   Hepatitis C Ab NON-REACTIVE NON-REACTIVE    Comment: . HCV antibody was non-reactive. There is no laboratory  evidence of HCV infection. . In most cases, no further action is required. However, if recent HCV exposure is suspected, a test for HCV RNA (test code 16109) is suggested. . For additional information please refer to http://education.[facility].com/faq/FAQ22v1 (This link is being provided for informational/ educational purposes  only.) .   Anti-nuclear ab-titer (ANA titer)     Status: Abnormal   Collection Time: 04/07/22  7:56 AM  Result Value Ref Range   ANA Titer 1 1:40 (H) titer    Comment: A low level ANA titer may be present in pre-clinical autoimmune diseases and normal individuals.                 Reference Range                 <1:40        Negative                 1:40-1:80    Low Antibody Level                 >1:80        Elevated Antibody Level .    ANA Pattern 1 Nuclear, Speckled (A)     Comment: Speckled pattern is associated with mixed connective tissue disease (MCTD), systemic lupus erythematosus (SLE), Sjogren's syndrome, dermatomyositis, and  systemic sclerosis/polymyositis overlap. . AC-2,4,5,29: Speckled . International Consensus on ANA Patterns ([email or web address])   Hemochromatosis DNA-PCR(c282y,h63d)     Status: None   Collection Time: 04/19/22  9:33 AM  Result Value Ref Range   DNA MUTATION ANALYSIS See Below     Comment: RESULT: POSITIVE FOR TWO COPIES OF THE HFE GENE PATHOGENIC VARIANT: C282Y/C282Y (HOMOZYGOTE) . Interpretation: Two copies of the C282Y pathogenic variant in the HFE gene were detected. This patient is negative for the H63D pathogenic variant. Approximately 60% to 90% of individuals with a biochemical diagnosis of hereditary hemochromatosis (HH) have this genotype. Therefore, this result is consistent with a diagnosis of HH in an individual with clinical evidence of HH. However, this genotype does not predict a diagnosis of HH in an asymptomatic individual, as not all individuals with this genotype will develop symptoms or clinical evidence of this disorder. Disease diagnosis can only be made by demonstration of elevated iron stores. Consider genetic counseling and DNA testing for at-risk family members. . . Laboratory testing supervised and results monitored by Cordella Register, Ph.D., [facility], [facility]. . . . DETAILED ASSAY  INFORMATION: Hereditary hemochromatosis ( HH) is an autosomal recessive disorder of iron metabolism that can result in iron overload and potential organ failure. It is one of the most common genetic disorders in individuals of European-Caucasian ancestry, with an estimated carrier frequency of 10%. HH is caused by pathogenic variants in the HFE gene. Most individuals with HH (60-90%) are homozygous for the C282Y pathogenic variant. A smaller percentage of affected individuals are either compound heterozygous for the C282Y and H63D pathogenic variants (3%-8%), or homozygous for the H63D pathogenic variant (approximately 1%). . METHODOLOGY: This assay detects two pathogenic variants in the HFE gene, C282Y (NM 604540.9: c.845G>A, p.Cys282Tyr) and H63D (NM 811914.7: c.187C>G, p.His63Asp), that are commonly associated with  HH. These variants are detected by multiplex-polymerase chain reaction (PCR) amplification, followed by restriction enzyme digestion and capillary electrophoresis. Marland Kitchen LIMITATIONS: This assay d oes not detect other pathogenic variants in the HFE gene that may be associated with HH. Although rare, false positive or false negative results may occur. All results should be interpreted in the context of clinical findings, relevant history, and other laboratory data. . Health care providers, please contact your local [place]' genetic counselor or call [phone]) for assistance with the interpretation of these results. . This test was developed and its analytical performance characteristics have been determined by [facility]. It has not been cleared or approved by FDA. This assay has been validated pursuant to the CLIA regulations and is used for clinical purposes. . For more information, please refer to http://education.quest[facility].com/faq/hemochromatosis. (This link is being provided for  informational/educational purposes only.) . A portion of the testing was performed at [facility] . Reviewed and signed by Laboratory testing supervised and results monitored by Ulla Potash, Ph.D., [facility], [facility], Signed on 04/28/2022 at 10:59   Type and screen [facility]     Status: None   Collection Time: 05/07/22  9:19 AM  Result Value Ref Range   ABO/RH(D) A NEG    Antibody Screen NEG    Sample Expiration 05/21/2022,2359    Extend sample reason      NO TRANSFUSIONS OR PREGNANCY IN THE PAST 3 MONTHS Performed at [facility], [address], [place], [place] [address]     RADIOGRAPHIC STUDIES: I have personally reviewed the radiological images as listed and agreed with the findings in the report. MR LIVER W WO CONTRAST  Result Date: 05/24/2022 CLINICAL DATA:  Hemochromatosis. EXAM: MRI ABDOMEN WITHOUT AND WITH CONTRAST TECHNIQUE: Multiplanar multisequence MR imaging of the abdomen was performed both before and after the administration of intravenous contrast. CONTRAST:  7.50mL GADAVIST GADOBUTROL 1 MMOL/ML IV SOLN COMPARISON:  None Available. FINDINGS: Lower chest: No acute findings. Hepatobiliary: Severe diffuse T1 and T2 hypointensity of the hepatic parenchyma is seen, consistent with iron deposition disease. No hepatic masses identified. Gallbladder is unremarkable. Mild diffuse biliary ductal dilatation is seen with common bile duct measuring 9 mm. Smoothly tapered stricture of the distal common bile duct is seen near the ampulla. No evidence of choledocholithiasis or mass. Pancreas: Diffuse pancreatic atrophy is noted. No evidence of pancreatic mass or ductal dilatation. No evidence of increased iron deposition. Spleen: Within normal limits in size and appearance. Normal T2 signal intensity, without evidence of increased iron deposition. Adrenals/Urinary Tract: No suspicious masses identified. No evidence of hydronephrosis. Stomach/Bowel: Unremarkable. Vascular/Lymphatic: No  pathologically enlarged lymph nodes identified. No acute vascular findings. Other:  Mild ascites. Musculoskeletal:  No suspicious bone lesions identified. IMPRESSION: Severe diffuse hepatic iron deposition disease, without evidence iron deposition within the spleen, pancreas, or bone marrow. Findings are consistent with primary hemochromatosis. No evidence of hepatic neoplasm. Mild diffuse biliary ductal dilatation, with benign-appearing stricture of the distal common bile duct. No evidence of choledocholithiasis or mass. Mild ascites. Electronically Signed   By: Marlaine Hind M.D.   On: 05/24/2022 14:42     ASSESSMENT & PLAN:  1.  Hereditary hemochromatosis, C282Y homozygous - Seen at the request of Dr. Abbey Chatters (gastroenterology) for further evaluation and management of hemochromatosis, which was discovered during workup of transaminitis. Iron panel (04/07/2022) the with ferritin 2828, iron saturation 95%. Hemochromatosis DNA testing revealed C282Y homozygosity (04/19/2022) MRI abdomen (05/24/2022) consistent with severe  diffuse hepatic iron deposition disease, without evidence of iron deposition within the spleen, pancreas, or bone marrow. - No family history of hemochromatosis. - Symptomatic iron overload with fatigue and arthralgia - Have discussed with patient the natural course of untreated hereditary hemochromatosis with risk of cirrhosis, hepatocellular carcinoma, heart failure, and arrhythmia.  Iron overload also increases risk of certain bacterial infections. - We have discussed lifestyle modifications such as avoiding alcohol and iron supplementation.  Recent guidelines do not find that moderate red meat intake adversely affects patients with hemochromatosis.  Patient should avoid raw fish or undercooked meat due to risk of certain bacterial infections to thrive and iron rich environments. - We have discussed phlebotomy protocol for treatment of hemochromatosis: Weekly phlebotomy as tolerated  until goal ferritin is reached. If Hgb drops < 11.0, we will decrease frequency of phlebotomy. If patient experiences severe side effects or hemodynamic instability, we will decrease amount taken for phlebotomy. Goal is ferritin < 50 for normalization of iron stores. Once iron stores have normalized, we will decrease frequency of phlebotomy. - PLAN: CBC/D+ same-day phlebotomy (proceed as long as Hgb >11.0) - Continue weekly phlebotomy + monthly CBC/D and ferritin - Same-day labs (CBC/D, ferritin, iron/TIBC) + OFFICE visit + phlebotomy in 6 weeks  2.  Tobacco use - Current active smoker, 1 PPD since age 47 - Receives regular CT scans via cardiothoracic/cardiology for monitoring of bicuspid aorta and thoracic aortic aneurysm - CTA chest (12/24/2021) negative for any signs of pulmonary malignancy - PLAN: We discussed importance of smoking cessation and will continue to follow-up on this at future visits. - Patient qualifies for LDCT scan, but this is unnecessary as he is currently receiving regular cardiac/chest imaging as above.  3.  Other history - PMH: Psoriatic arthritis (on chronic methotrexate), fatty liver disease, history of alcoholism, GERD, chronic pain, bicuspid aorta with thoracic aortic aneurysm, anxiety/depression, osteoarthritis.  Last colonoscopy was in 2021, with recommendations for repeat colonoscopy in 10 years.  - SOCIAL: Lives at home with his wife. He is currently on disability due to his psoriatic arthritis, but previously worked in Air[employer]. He has a history of alcoholism (drank heavily from age 11 to age 62), but has been sober since 2019. He also reports history of illicit drug use (cocaine, marijuana, opioid pills, fentanyl), but has been clean for the past 4 years. He is a current active smoker, 1 PPD since age 50.  - FAMILY: No family history of hemochromatosis, blood problems, cancer   PLAN SUMMARY: >> Therapeutic phlebotomy TWICE weekly >> CBC/D x 1 (on same day as  first phlebotomy next week) >> Same-day labs (CBC/D, ferritin) + OFFICE visit in 6 weeks    All questions were answered. The patient knows to call the clinic with any problems, questions or concerns.  Medical decision making: Moderate  Time spent on visit: I spent 35 minutes counseling the patient face to face. The total time spent in the appointment was 55 minutes and more than 50% was on counseling.  I, Rojelio Brenner PA-C, have seen this patient in conjunction with Dr. Doreatha Massed.  Greater than 50% of visit was performed by Dr. Ellin Saba.   Carnella Guadalajara, PA-C 06/01/2022 10:20 AM  DR. Drucella Karbowski: I have independently evaluated this patient and formulated my assessment and plan.  I agree with HPI, assessment and plan written by Elizebeth Koller, PA-C.  Patient evaluated for newly diagnosed homozygous hereditary hemochromatosis when he was found to have elevated ferritin and LFTs.  An  MRI of the liver also showed severe iron deposition.  We talked about his new diagnosis and management plan with phlebotomies twice weekly if his hemoglobin allows to bring his ferritin target goal less than 50.  We will check his ferritin monthly to evaluate response.

## 2022-06-01 ENCOUNTER — Inpatient Hospital Stay: Payer: 59 | Attending: Hematology | Admitting: Hematology

## 2022-06-01 ENCOUNTER — Encounter: Payer: Self-pay | Admitting: Hematology

## 2022-06-01 ENCOUNTER — Other Ambulatory Visit: Payer: Self-pay

## 2022-06-01 DIAGNOSIS — F17210 Nicotine dependence, cigarettes, uncomplicated: Secondary | ICD-10-CM | POA: Diagnosis not present

## 2022-06-01 DIAGNOSIS — K219 Gastro-esophageal reflux disease without esophagitis: Secondary | ICD-10-CM | POA: Diagnosis not present

## 2022-06-01 DIAGNOSIS — I712 Thoracic aortic aneurysm, without rupture, unspecified: Secondary | ICD-10-CM | POA: Insufficient documentation

## 2022-06-01 DIAGNOSIS — K700 Alcoholic fatty liver: Secondary | ICD-10-CM | POA: Insufficient documentation

## 2022-06-01 DIAGNOSIS — Z79899 Other long term (current) drug therapy: Secondary | ICD-10-CM | POA: Diagnosis not present

## 2022-06-01 DIAGNOSIS — F32A Depression, unspecified: Secondary | ICD-10-CM | POA: Insufficient documentation

## 2022-06-01 DIAGNOSIS — L4050 Arthropathic psoriasis, unspecified: Secondary | ICD-10-CM | POA: Diagnosis not present

## 2022-06-01 DIAGNOSIS — Q231 Congenital insufficiency of aortic valve: Secondary | ICD-10-CM

## 2022-06-01 DIAGNOSIS — F419 Anxiety disorder, unspecified: Secondary | ICD-10-CM | POA: Insufficient documentation

## 2022-06-01 HISTORY — DX: Hereditary hemochromatosis: E83.110

## 2022-06-01 NOTE — Patient Instructions (Signed)
[facility] **   You were seen today by Dr. Delton Coombes & Tarri Abernethy PA-C for your hereditary hemochromatosis.    HEREDITARY HEMOCHROMATOSIS As we discussed, this is a genetic condition that causes you to accumulate too much iron, particularly in your liver.  Please see the attached paperwork for additional information. We will treat this with therapeutic phlebotomy ("blood donation") twice a week, until your ferritin is <50 (which may take 1 to 2 years to achieve)  FOLLOW-UP APPOINTMENT: Office visit in 6 weeks  ** Thank you for trusting me with your healthcare!  I strive to provide all of my patients with quality care at each visit.  If you receive a survey for this visit, I would be so grateful to you for taking the time to provide feedback.  Thank you in advance!  ~ Elvis Laufer                   Dr. Derek Jack   &   Tarri Abernethy, PA-C   - - - - - - - - - - - - - - - - - -    Thank you for choosing [place] [facility] to provide your oncology and hematology care.  To afford each patient quality time with our provider, please arrive at least 15 minutes before your scheduled appointment time.   If you have a lab appointment with the [facility] please come in thru the Main Entrance and check in at the main information desk.  You need to re-schedule your appointment should you arrive 10 or more minutes late.  We strive to give you quality time with our providers, and arriving late affects you and other patients whose appointments are after yours.  Also, if you no show three or more times for appointments you may be dismissed from the clinic at the providers discretion.     Again, thank you for choosing [facility].  Our hope is that these requests will decrease the amount of time that you wait before being seen by our physicians.        _____________________________________________________________  Should you have questions after your visit to [facility], please contact our office at [phone] and follow the prompts.  Our office hours are 8:00 a.m. and 4:30 p.m. Monday - Friday.  Please note that voicemails left after 4:00 p.m. may not be returned until the following business day.  We are closed weekends and major holidays.  You do have access to a nurse 24-7, just call the main number to the clinic [phone] and do not press any options, hold on the line and a nurse will answer the phone.    For prescription refill requests, have your pharmacy contact our office and allow 72 hours.

## 2022-06-02 ENCOUNTER — Ambulatory Visit: Payer: 59 | Attending: Interventional Cardiology

## 2022-06-02 DIAGNOSIS — R7989 Other specified abnormal findings of blood chemistry: Secondary | ICD-10-CM

## 2022-06-02 DIAGNOSIS — E782 Mixed hyperlipidemia: Secondary | ICD-10-CM

## 2022-06-02 LAB — HEPATIC FUNCTION PANEL
ALT: 42 IU/L (ref 0–44)
AST: 43 IU/L — ABNORMAL HIGH (ref 0–40)
Albumin: 4.1 g/dL (ref 3.8–4.9)
Alkaline Phosphatase: 106 IU/L (ref 44–121)
Bilirubin Total: 0.3 mg/dL (ref 0.0–1.2)
Bilirubin, Direct: 0.13 mg/dL (ref 0.00–0.40)
Total Protein: 5.6 g/dL — ABNORMAL LOW (ref 6.0–8.5)

## 2022-06-02 LAB — LIPID PANEL
Chol/HDL Ratio: 2.3 ratio (ref 0.0–5.0)
Cholesterol, Total: 115 mg/dL (ref 100–199)
HDL: 50 mg/dL (ref >39)
LDL Chol Calc (NIH): 53 mg/dL (ref 0–99)
Triglycerides: 53 mg/dL (ref 0–149)
VLDL Cholesterol Cal: 12 mg/dL (ref 5–40)

## 2022-06-05 NOTE — Progress Notes (Signed)
Cardiology Office Note   Date:  06/07/2022   ID:  Howard [name], DOB Apr 08, 1966, MRN [id number]  PCP:  Elfredia Nevins, MD    No chief complaint on file.    Wt Readings from Last 3 Encounters:  06/07/22 165 lb (74.8 kg)  06/01/22 166 lb (75.3 kg)  05/07/22 165 lb (74.8 kg)       History of Present Illness: Howard [name] is a 56 y.o. male  with coronary calcium.    Negative stress test many years ago.   He also feels a lot of chronic anxiety- in regards to his psoriatic arthritis.  Multiple joint surgeries in the past.    No family h/o early CAD.  Siblings without CAD.   Mother had MI at 19.    He has lost 90 lbs from 2019-2021, when he got sober ( stopping beer helped).  H/o alcohol /opioid abuse.    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    He had a CT scan in 9/21 for scoliosis:"Age advanced atherosclerotic calcifications involving the thoracic aorta and branch vessels including coronary artery calcifications."   Had trouble quitting smoking.    He had foot surgery in 2022.  Back to exercising.  He is trying to quit smoking.  He is trying some pouches of nicotine.  Weaning himself off, and he thinks it is going well- down to 10 cigs.  Diagnosed with hemochromatosis.  Starting phlebotomy today.   Denies : Chest pain. Dizziness. Leg edema. Nitroglycerin use. Orthopnea. Palpitations. Paroxysmal nocturnal dyspnea. Shortness of breath. Syncope.    Past Medical History:  Diagnosis Date   Alcohol abuse    Anxiety    Chronic back pain    Chronic pain    Depression    Fall    Fracture of right elbow    Hereditary hemochromatosis (HCC) 06/01/2022   Osteoarthritis    Psoriasis     Past Surgical History:  Procedure Laterality Date   APPENDECTOMY     COLONOSCOPY WITH PROPOFOL N/A 06/07/2019   diverticulosis, otherwise normal. Non-bleeding internal hemorrhoids.    FOOT SURGERY     per patient: "neurofibroma excision"   LUMBAR LAMINECTOMY/DECOMPRESSION  MICRODISCECTOMY Bilateral 12/31/2015   Procedure: LUMBER DECOMPRESSION L4-5/ L5-S1 BILATERALLY  ;  Surgeon: Jene Every, MD;  Location: [facility] ORS;  Service: Orthopedics;  Laterality: Bilateral;   REVERSE SHOULDER ARTHROPLASTY Right 10/24/2018   REVERSE SHOULDER ARTHROPLASTY Right 10/24/2018   Procedure: REVERSE SHOULDER ARTHROPLASTY;  Surgeon: Yolonda Kida, MD;  [facility];  Service: Orthopedics;  Laterality: Right;   SHOULDER SURGERY Right    TOTAL HIP ARTHROPLASTY Right    TOTAL HIP ARTHROPLASTY Left 08/27/2014   Procedure: LEFT TOTAL HIP ARTHROPLASTY ANTERIOR APPROACH;  Surgeon: Durene Romans, MD;  Location: [facility] ORS;  Service: Orthopedics;  Laterality: Left;   XI ROBOTIC ASSISTED INGUINAL HERNIA REPAIR WITH MESH Right 05/12/2022   Procedure: XI ROBOTIC ASSISTED INGUINAL HERNIA REPAIR WITH MESH, RIGHT;  Surgeon: Franky Macho, MD;  Location: [facility]S;  Service: General;  Laterality: Right;     Current Outpatient Medications  Medication Sig Dispense Refill   ALPRAZolam (XANAX) 0.5 MG tablet Take 0.5 mg by mouth 2 (two) times daily as needed for anxiety.     amoxicillin (AMOXIL) 500 MG capsule Take 2,000 mg by mouth See admin instructions. Take 2000 mg by mouth 1 hour before dental appointment for pre-med.     amphetamine-dextroamphetamine (ADDERALL) 20 MG tablet Take 20 mg by mouth 2 (two)  times daily.     Buprenorphine HCl-Naloxone HCl 8-2 MG FILM Place under the tongue.     DULoxetine (CYMBALTA) 60 MG capsule Take 60 mg by mouth daily.     folic acid (FOLVITE) 1 MG tablet Take 1 mg by mouth daily.     Golimumab (SIMPONI ARIA IV) Inject 1 Dose into the vein every 8 (eight) weeks.     levothyroxine (SYNTHROID, LEVOTHROID) 25 MCG tablet Take 25 mcg by mouth daily before breakfast.      Methotrexate Sodium (METHOTREXATE, PF,) 50 MG/2ML injection Inject 0.7 mLs into the muscle once a week.     omeprazole (PRILOSEC) 20 MG capsule Take 20 mg by mouth daily as needed (acid reflux).      predniSONE (DELTASONE) 5 MG tablet Take 5-10 mg by mouth daily as needed (arthritis).     rosuvastatin (CRESTOR) 5 MG tablet Take 1 tablet (5 mg total) by mouth daily. 90 tablet 3   tamsulosin (FLOMAX) 0.4 MG CAPS capsule Take 0.4 mg by mouth daily.     ibuprofen (ADVIL) 800 MG tablet Take 800 mg by mouth every 8 (eight) hours as needed for moderate pain. (Patient not taking: Reported on 06/07/2022)     magnesium oxide (MAG-OX) 400 (240 Mg) MG tablet Take 400 mg by mouth daily.     methocarbamol (ROBAXIN-750) 750 MG tablet Take 1 tablet (750 mg total) by mouth every 8 (eight) hours as needed for muscle spasms. 30 tablet 1   OVER THE COUNTER MEDICATION Take 1 tablet by mouth daily. Mega Mens     oxyCODONE (ROXICODONE) 5 MG immediate release tablet Take 1 tablet (5 mg total) by mouth every 4 (four) hours as needed. 30 tablet 0   varenicline (CHANTIX) 1 MG tablet Take 1 mg by mouth daily. (Patient not taking: Reported on 06/07/2022)     No current facility-administered medications for this visit.    Allergies:   Patient has no known allergies.    Social History:  The patient  reports that he has been smoking cigarettes. He has a 32.00 pack-year smoking history. He has been exposed to tobacco smoke. He has never used smokeless tobacco. He reports that he does not currently use alcohol after a past usage of about 16.0 standard drinks of alcohol per week. He reports current drug use. Drug: Marijuana.   Family History:  The patient's family history includes Heart attack in his mother.    ROS:  Please see the history of present illness.   Otherwise, review of systems are positive for joint pains.   All other systems are reviewed and negative.    PHYSICAL EXAM: VS:  BP 108/66   Pulse 72   Ht 5\' 11"  (1.803 m)   Wt 165 lb (74.8 kg)   SpO2 97%   BMI 23.01 kg/m  , BMI Body mass index is 23.01 kg/m. GEN: Well nourished, well developed, in no acute distress HEENT: normal Neck: no JVD, carotid  bruits, or masses Cardiac: RRR; no murmurs, rubs, or gallops,no edema  Respiratory:  clear to auscultation bilaterally, normal work of breathing GI: soft, nontender, nondistended, + BS MS: no deformity or atrophy Skin: warm and dry, no rash Neuro:  Strength and sensation are intact Psych: euthymic mood, full affect   EKG:   The ekg ordered today demonstrates NSR, prolonged PR interval   Recent Labs: 06/02/2022: ALT 42   Lipid Panel    Component Value Date/Time   CHOL 115 06/02/2022 0906   TRIG  53 06/02/2022 0906   HDL 50 06/02/2022 0906   CHOLHDL 2.3 06/02/2022 0906   CHOLHDL 3.0 03/27/2015 1008   VLDL 18 03/27/2015 1008   LDLCALC 53 06/02/2022 0906     Other studies Reviewed: Additional studies/ records that were reviewed today with results demonstrating: labs reviewed.  October 2023 total cholesterol 117 HDL 55 LDL 47 triglycerides 70   ASSESSMENT AND PLAN:  Coronary artery calcification: No angina.  Will start taking aspirin 81 mg 3-4 x/week.  Watch for bleeding or bruising.   He already feels like he bruises easily.  .  Cutting back on the eating meats. Recommended a whole food, Plant-based diet.  Continue regular exercise at the gym.   Tobacco abuse: Tried Chantix.  Had some success with that.  Pills were accidentally discarded.  Will restart Chantix starter pack. Hypothyroid: Followed with PMD.  History of alcohol abuse: No alcohol use. Anxiety: controlled.  Hemochromatosis: Starting treatment with phlebotomy in the near future.  We talked about the liver and heart effects from untreated hemochromatosis.   Current medicines are reviewed at length with the patient today.  The patient concerns regarding his medicines were addressed.  The following changes have been made:  No change  Labs/ tests ordered today include:  No orders of the defined types were placed in this encounter.   Recommend 150 minutes/week of aerobic exercise Low fat, low carb, high fiber diet  recommended  Disposition:   FU in 1 year   Signed, Larae Grooms, MD  06/07/2022 11:35 AM    [address], [place], [place]  [address] Phone: [phone]; Fax: [phone]

## 2022-06-07 ENCOUNTER — Other Ambulatory Visit: Payer: Self-pay | Admitting: Physician Assistant

## 2022-06-07 ENCOUNTER — Inpatient Hospital Stay: Payer: 59

## 2022-06-07 ENCOUNTER — Other Ambulatory Visit: Payer: Self-pay

## 2022-06-07 ENCOUNTER — Ambulatory Visit: Payer: 59 | Attending: Interventional Cardiology | Admitting: Interventional Cardiology

## 2022-06-07 VITALS — BP 108/66 | HR 72 | Ht 71.0 in | Wt 165.0 lb

## 2022-06-07 DIAGNOSIS — F419 Anxiety disorder, unspecified: Secondary | ICD-10-CM | POA: Diagnosis not present

## 2022-06-07 DIAGNOSIS — F1011 Alcohol abuse, in remission: Secondary | ICD-10-CM | POA: Diagnosis not present

## 2022-06-07 DIAGNOSIS — I2584 Coronary atherosclerosis due to calcified coronary lesion: Secondary | ICD-10-CM | POA: Diagnosis not present

## 2022-06-07 DIAGNOSIS — Z720 Tobacco use: Secondary | ICD-10-CM

## 2022-06-07 DIAGNOSIS — I2510 Atherosclerotic heart disease of native coronary artery without angina pectoris: Secondary | ICD-10-CM | POA: Diagnosis not present

## 2022-06-07 LAB — CBC WITH DIFFERENTIAL/PLATELET
Abs Immature Granulocytes: 0 10*3/uL (ref 0.00–0.07)
Basophils Absolute: 0 10*3/uL (ref 0.0–0.1)
Basophils Relative: 0 %
Eosinophils Absolute: 0.2 10*3/uL (ref 0.0–0.5)
Eosinophils Relative: 3 %
HCT: 37.5 % — ABNORMAL LOW (ref 39.0–52.0)
Hemoglobin: 12.5 g/dL — ABNORMAL LOW (ref 13.0–17.0)
Lymphocytes Relative: 44 %
Lymphs Abs: 2.7 10*3/uL (ref 0.7–4.0)
MCH: 32.6 pg (ref 26.0–34.0)
MCHC: 33.3 g/dL (ref 30.0–36.0)
MCV: 97.7 fL (ref 80.0–100.0)
Monocytes Absolute: 0.1 10*3/uL (ref 0.1–1.0)
Monocytes Relative: 2 %
Neutro Abs: 3.2 10*3/uL (ref 1.7–7.7)
Neutrophils Relative %: 51 %
Platelets: 151 10*3/uL (ref 150–400)
RBC: 3.84 MIL/uL — ABNORMAL LOW (ref 4.22–5.81)
RDW: 13.2 % (ref 11.5–15.5)
WBC: 6.2 10*3/uL (ref 4.0–10.5)
nRBC: 0 % (ref 0.0–0.2)

## 2022-06-07 LAB — FERRITIN: Ferritin: 1501 ng/mL — ABNORMAL HIGH (ref 24–336)

## 2022-06-07 MED ORDER — VARENICLINE TARTRATE (STARTER) 0.5 MG X 11 & 1 MG X 42 PO TBPK: ORAL_TABLET | ORAL | 0 refills | Status: DC

## 2022-06-07 NOTE — Patient Instructions (Signed)
Medication Instructions:  Your physician has recommended you make the following change in your medication: Start Chantix starter pack.  Follow label instructions.   *If you need a refill on your cardiac medications before your next appointment, please call your pharmacy*   Lab Work: none If you have labs (blood work) drawn today and your tests are completely normal, you will receive your results only by: [facility] (if you have MyChart) OR A paper copy in the mail If you have any lab test that is abnormal or we need to change your treatment, we will call you to review the results.   Testing/Procedures: none   Follow-Up: At [facility], you and your health needs are our priority.  As part of our continuing mission to provide you with exceptional heart care, we have created designated Provider Care Teams.  These Care Teams include your primary Cardiologist (physician) and Advanced Practice Providers (APPs -  Physician Assistants and Nurse Practitioners) who all work together to provide you with the care you need, when you need it.  We recommend signing up for the patient portal called "MyChart".  Sign up information is provided on this After Visit Summary.  MyChart is used to connect with patients for Virtual Visits (Telemedicine).  Patients are able to view lab/test results, encounter notes, upcoming appointments, etc.  Non-urgent messages can be sent to your provider as well.   To learn more about what you can do with MyChart, go to [email or web address].    Your next appointment:   12 month(s)  Provider:   Larae Grooms, MD     Other Instructions

## 2022-06-07 NOTE — Patient Instructions (Signed)
Howard [name]  Discharge Instructions: Thank you for choosing [facility] to provide your oncology and hematology care.  If you have a lab appointment with the [facility], please come in thru the Main Entrance and check in at the main information desk.  Wear comfortable clothing and clothing appropriate for easy access to any Portacath or PICC line.   We strive to give you quality time with your provider. You may need to reschedule your appointment if you arrive late (15 or more minutes).  Arriving late affects you and other patients whose appointments are after yours.  Also, if you miss three or more appointments without notifying the office, you may be dismissed from the clinic at the provider's discretion.      For prescription refill requests, have your pharmacy contact our office and allow 72 hours for refills to be completed.    Today you received the following Phlebotomy, return as scheduled.   To help prevent nausea and vomiting after your treatment, we encourage you to take your nausea medication as directed.  BELOW ARE SYMPTOMS THAT SHOULD BE REPORTED IMMEDIATELY: *FEVER GREATER THAN 100.4 F (38 C) OR HIGHER *CHILLS OR SWEATING *NAUSEA AND VOMITING THAT IS NOT CONTROLLED WITH YOUR NAUSEA MEDICATION *UNUSUAL SHORTNESS OF BREATH *UNUSUAL BRUISING OR BLEEDING *URINARY PROBLEMS (pain or burning when urinating, or frequent urination) *BOWEL PROBLEMS (unusual diarrhea, constipation, pain near the anus) TENDERNESS IN MOUTH AND THROAT WITH OR WITHOUT PRESENCE OF ULCERS (sore throat, sores in mouth, or a toothache) UNUSUAL RASH, SWELLING OR PAIN  UNUSUAL VAGINAL DISCHARGE OR ITCHING   Items with * indicate a potential emergency and should be followed up as soon as possible or go to the Emergency Department if any problems should occur.  Please show the CHEMOTHERAPY ALERT CARD or IMMUNOTHERAPY ALERT CARD at check-in to the Emergency Department and  triage nurse.  Should you have questions after your visit or need to cancel or reschedule your appointment, please contact [facility] [phone]  and follow the prompts.  Office hours are 8:00 a.m. to 4:30 p.m. Monday - Friday. Please note that voicemails left after 4:00 p.m. may not be returned until the following business day.  We are closed weekends and major holidays. You have access to a nurse at all times for urgent questions. Please call the main number to the clinic [phone] and follow the prompts.  For any non-urgent questions, you may also contact your provider using MyChart. We now offer e-Visits for anyone 9 and older to request care online for non-urgent symptoms. For details visit [email or web address].   Also download the MyChart app! Go to the app store, search "MyChart", open the app, select [facility], and log in with your MyChart username and password.

## 2022-06-07 NOTE — Progress Notes (Signed)
CBC shows mild anemia with Hgb 12.5.  Therefore, we will change phlebotomy to ONCE weekly (rather than twice weekly) and see how he tolerates it. We will add B12, folate, MMA, SPEP, light chains, immunofixation, CMP to his already scheduled CBC and ferritin on 07/13/2022.

## 2022-06-07 NOTE — Progress Notes (Signed)
Howard [name] presents today for phlebotomy per MD orders. Patient states he did eat this lunch and has been drinking a lot of water. Wife is present during today's visit. Patient provided with a Coke. Phlebotomy procedure started at 1345 and ended at 1348. 200 cc removed. Patient started to feel lightheaded and appeared diaphoretic. Phlebotomy stopped, IV removed, and patient was reclined back in chair, patient started to feel better after a couple of minutes BP 97/62. Tarri Abernethy, PA made aware of patient's symptoms. BP after 30 mins, 106/64 patient states he feels good. Patient discharged in satisfactory condition.

## 2022-06-10 ENCOUNTER — Inpatient Hospital Stay: Payer: 59

## 2022-06-14 ENCOUNTER — Inpatient Hospital Stay: Payer: 59

## 2022-06-14 NOTE — Progress Notes (Signed)
Howard [name] presents today for phlebotomy per MD orders. Phlebotomy procedure started at 1340 and ended at 1356. 500 cc removed. Patient tolerated procedure well. IV needle removed intact.

## 2022-06-14 NOTE — Patient Instructions (Signed)
Howard [name]  Discharge Instructions: Thank you for choosing [facility] to provide your oncology and hematology care.  If you have a lab appointment with the [facility], please come in thru the Main Entrance and check in at the main information desk.  Wear comfortable clothing and clothing appropriate for easy access to any Portacath or PICC line.   We strive to give you quality time with your provider. You may need to reschedule your appointment if you arrive late (15 or more minutes).  Arriving late affects you and other patients whose appointments are after yours.  Also, if you miss three or more appointments without notifying the office, you may be dismissed from the clinic at the provider's discretion.      For prescription refill requests, have your pharmacy contact our office and allow 72 hours for refills to be completed.    Today you received the following Phlebotomy, return as scheduled.   To help prevent nausea and vomiting after your treatment, we encourage you to take your nausea medication as directed.  BELOW ARE SYMPTOMS THAT SHOULD BE REPORTED IMMEDIATELY: *FEVER GREATER THAN 100.4 F (38 C) OR HIGHER *CHILLS OR SWEATING *NAUSEA AND VOMITING THAT IS NOT CONTROLLED WITH YOUR NAUSEA MEDICATION *UNUSUAL SHORTNESS OF BREATH *UNUSUAL BRUISING OR BLEEDING *URINARY PROBLEMS (pain or burning when urinating, or frequent urination) *BOWEL PROBLEMS (unusual diarrhea, constipation, pain near the anus) TENDERNESS IN MOUTH AND THROAT WITH OR WITHOUT PRESENCE OF ULCERS (sore throat, sores in mouth, or a toothache) UNUSUAL RASH, SWELLING OR PAIN  UNUSUAL VAGINAL DISCHARGE OR ITCHING   Items with * indicate a potential emergency and should be followed up as soon as possible or go to the Emergency Department if any problems should occur.  Please show the CHEMOTHERAPY ALERT CARD or IMMUNOTHERAPY ALERT CARD at check-in to the Emergency Department and  triage nurse.  Should you have questions after your visit or need to cancel or reschedule your appointment, please contact [facility] [phone]  and follow the prompts.  Office hours are 8:00 a.m. to 4:30 p.m. Monday - Friday. Please note that voicemails left after 4:00 p.m. may not be returned until the following business day.  We are closed weekends and major holidays. You have access to a nurse at all times for urgent questions. Please call the main number to the clinic [phone] and follow the prompts.  For any non-urgent questions, you may also contact your provider using MyChart. We now offer e-Visits for anyone 9 and older to request care online for non-urgent symptoms. For details visit [email or web address].   Also download the MyChart app! Go to the app store, search "MyChart", open the app, select [facility], and log in with your MyChart username and password.

## 2022-06-21 ENCOUNTER — Inpatient Hospital Stay: Payer: 59 | Attending: Hematology

## 2022-06-21 NOTE — Patient Instructions (Signed)
Howard [name]  Discharge Instructions: Thank you for choosing [facility] to provide your oncology and hematology care.  If you have a lab appointment with the [facility], please come in thru the Main Entrance and check in at the main information desk.  Wear comfortable clothing and clothing appropriate for easy access to any Portacath or PICC line.   We strive to give you quality time with your provider. You may need to reschedule your appointment if you arrive late (15 or more minutes).  Arriving late affects you and other patients whose appointments are after yours.  Also, if you miss three or more appointments without notifying the office, you may be dismissed from the clinic at the provider's discretion.      For prescription refill requests, have your pharmacy contact our office and allow 72 hours for refills to be completed.    Today you received the following Phlebotomy, return as scheduled.   To help prevent nausea and vomiting after your treatment, we encourage you to take your nausea medication as directed.  BELOW ARE SYMPTOMS THAT SHOULD BE REPORTED IMMEDIATELY: *FEVER GREATER THAN 100.4 F (38 C) OR HIGHER *CHILLS OR SWEATING *NAUSEA AND VOMITING THAT IS NOT CONTROLLED WITH YOUR NAUSEA MEDICATION *UNUSUAL SHORTNESS OF BREATH *UNUSUAL BRUISING OR BLEEDING *URINARY PROBLEMS (pain or burning when urinating, or frequent urination) *BOWEL PROBLEMS (unusual diarrhea, constipation, pain near the anus) TENDERNESS IN MOUTH AND THROAT WITH OR WITHOUT PRESENCE OF ULCERS (sore throat, sores in mouth, or a toothache) UNUSUAL RASH, SWELLING OR PAIN  UNUSUAL VAGINAL DISCHARGE OR ITCHING   Items with * indicate a potential emergency and should be followed up as soon as possible or go to the Emergency Department if any problems should occur.  Please show the CHEMOTHERAPY ALERT CARD or IMMUNOTHERAPY ALERT CARD at check-in to the Emergency Department and  triage nurse.  Should you have questions after your visit or need to cancel or reschedule your appointment, please contact [facility] [phone]  and follow the prompts.  Office hours are 8:00 a.m. to 4:30 p.m. Monday - Friday. Please note that voicemails left after 4:00 p.m. may not be returned until the following business day.  We are closed weekends and major holidays. You have access to a nurse at all times for urgent questions. Please call the main number to the clinic [phone] and follow the prompts.  For any non-urgent questions, you may also contact your provider using MyChart. We now offer e-Visits for anyone 9 and older to request care online for non-urgent symptoms. For details visit [email or web address].   Also download the MyChart app! Go to the app store, search "MyChart", open the app, select [facility], and log in with your MyChart username and password.

## 2022-06-21 NOTE — Progress Notes (Signed)
Howard [name] presents today for phlebotomy per MD orders. Phlebotomy procedure started at 1400 and ended at 1410. 500 cc removed. Patient tolerated procedure well. IV needle removed intact.

## 2022-06-28 ENCOUNTER — Inpatient Hospital Stay: Payer: 59

## 2022-06-28 NOTE — Patient Instructions (Signed)
Howard [name]  Discharge Instructions: Thank you for choosing [facility] to provide your oncology and hematology care.  If you have a lab appointment with the [facility], please come in thru the Main Entrance and check in at the main information desk.  Wear comfortable clothing and clothing appropriate for easy access to any Portacath or PICC line.   We strive to give you quality time with your provider. You may need to reschedule your appointment if you arrive late (15 or more minutes).  Arriving late affects you and other patients whose appointments are after yours.  Also, if you miss three or more appointments without notifying the office, you may be dismissed from the clinic at the provider's discretion.      For prescription refill requests, have your pharmacy contact our office and allow 72 hours for refills to be completed.    Today you received the following chemotherapy and/or immunotherapy agents Phlebotomy      To help prevent nausea and vomiting after your treatment, we encourage you to take your nausea medication as directed.  BELOW ARE SYMPTOMS THAT SHOULD BE REPORTED IMMEDIATELY: *FEVER GREATER THAN 100.4 F (38 C) OR HIGHER *CHILLS OR SWEATING *NAUSEA AND VOMITING THAT IS NOT CONTROLLED WITH YOUR NAUSEA MEDICATION *UNUSUAL SHORTNESS OF BREATH *UNUSUAL BRUISING OR BLEEDING *URINARY PROBLEMS (pain or burning when urinating, or frequent urination) *BOWEL PROBLEMS (unusual diarrhea, constipation, pain near the anus) TENDERNESS IN MOUTH AND THROAT WITH OR WITHOUT PRESENCE OF ULCERS (sore throat, sores in mouth, or a toothache) UNUSUAL RASH, SWELLING OR PAIN  UNUSUAL VAGINAL DISCHARGE OR ITCHING   Items with * indicate a potential emergency and should be followed up as soon as possible or go to the Emergency Department if any problems should occur.  Please show the CHEMOTHERAPY ALERT CARD or IMMUNOTHERAPY ALERT CARD at check-in to the  Emergency Department and triage nurse.  Should you have questions after your visit or need to cancel or reschedule your appointment, please contact [facility] [phone]  and follow the prompts.  Office hours are 8:00 a.m. to 4:30 p.m. Monday - Friday. Please note that voicemails left after 4:00 p.m. may not be returned until the following business day.  We are closed weekends and major holidays. You have access to a nurse at all times for urgent questions. Please call the main number to the clinic [phone] and follow the prompts.  For any non-urgent questions, you may also contact your provider using MyChart. We now offer e-Visits for anyone 42 and older to request care online for non-urgent symptoms. For details visit [email or web address].   Also download the MyChart app! Go to the app store, search "MyChart", open the app, select [facility], and log in with your MyChart username and password.

## 2022-06-28 NOTE — Progress Notes (Signed)
Patient presents today for phlebotomy  per MD orders. Phlebotomy procedure started at 1420 and ended at 1425. 500 cc removed. Patient tolerated procedure well. Procedure tolerated well and without incident. Discharged ambulatory in stable condition.

## 2022-07-02 ENCOUNTER — Encounter: Payer: Self-pay | Admitting: Internal Medicine

## 2022-07-02 ENCOUNTER — Ambulatory Visit (INDEPENDENT_AMBULATORY_CARE_PROVIDER_SITE_OTHER): Payer: 59 | Admitting: Internal Medicine

## 2022-07-02 DIAGNOSIS — K219 Gastro-esophageal reflux disease without esophagitis: Secondary | ICD-10-CM | POA: Diagnosis not present

## 2022-07-02 DIAGNOSIS — R7989 Other specified abnormal findings of blood chemistry: Secondary | ICD-10-CM

## 2022-07-02 NOTE — Progress Notes (Signed)
Primary Care Physician:  Redmond School, MD Primary Gastroenterologist:  Dr. Gala Romney  Pre-Procedure History & Physical: HPI:  Howard [name] is a 57 y.o. male here for follow-up.  I last saw this gentleman in 2021 for a screening colonoscopy which was normal.  Recently diagnosed with hemochromatosis s (C2 82) homozygous positive).  Undergoing phlebotomies under the direction of Dr. Delton Coombes.  He says his joints already feeling better.  He is pleased with his progress. He is undergoing phlebotomies once weekly and tolerating so far. He is done very well getting his inguinal hernia repair recently.  No alcohol in nearly 5 years.  MRI demonstrated liver heavily laden with iron.  Dilated biliary tree with benign-appearing narrowing of the distal common bile duct.  He notes his arthritis is improved already.  He is previously diagnosed with psoriatic arthritis.  Meds include Simponi, methotrexate prednisone and Ariva. Reflux well-controlled with omeprazole 20 mg daily  Past Medical History:  Diagnosis Date   Alcohol abuse    Anxiety    Chronic back pain    Chronic pain    Depression    Fall    Fracture of right elbow    Hereditary hemochromatosis ([place]) 06/01/2022   Osteoarthritis    Psoriasis     Past Surgical History:  Procedure Laterality Date   APPENDECTOMY     COLONOSCOPY WITH PROPOFOL N/A 06/07/2019   diverticulosis, otherwise normal. Non-bleeding internal hemorrhoids.    FOOT SURGERY     per patient: "neurofibroma excision"   LUMBAR LAMINECTOMY/DECOMPRESSION MICRODISCECTOMY Bilateral 12/31/2015   Procedure: LUMBER DECOMPRESSION L4-5/ L5-S1 BILATERALLY  ;  Surgeon: Susa Day, MD;  Location: [facility];  Service: Orthopedics;  Laterality: Bilateral;   REVERSE SHOULDER ARTHROPLASTY Right 10/24/2018   REVERSE SHOULDER ARTHROPLASTY Right 10/24/2018   Procedure: REVERSE SHOULDER ARTHROPLASTY;  Surgeon: Nicholes Stairs, MD;  Location: [facility];  Service: Orthopedics;   Laterality: Right;   SHOULDER SURGERY Right    TOTAL HIP ARTHROPLASTY Right    TOTAL HIP ARTHROPLASTY Left 08/27/2014   Procedure: LEFT TOTAL HIP ARTHROPLASTY ANTERIOR APPROACH;  Surgeon: Paralee Cancel, MD;  Location: [facility];  Service: Orthopedics;  Laterality: Left;   XI ROBOTIC ASSISTED INGUINAL HERNIA REPAIR WITH MESH Right 05/12/2022   Procedure: XI ROBOTIC ASSISTED INGUINAL HERNIA REPAIR WITH MESH, RIGHT;  Surgeon: Aviva Signs, MD;  Location: [facility];  Service: General;  Laterality: Right;    Prior to Admission medications   Medication Sig Start Date End Date Taking? Authorizing Provider  ALPRAZolam Duanne Moron) 0.5 MG tablet Take 0.5 mg by mouth 2 (two) times daily as needed for anxiety.   Yes [provider]  amphetamine-dextroamphetamine (ADDERALL) 20 MG tablet Take 20 mg by mouth 2 (two) times daily. 07/14/18  Yes [provider]  Buprenorphine HCl-Naloxone HCl 8-2 MG FILM Place under the tongue. 05/21/22  Yes [provider]  DULoxetine (CYMBALTA) 30 MG capsule Take 30 mg by mouth daily. 06/20/22  Yes [provider]  DULoxetine (CYMBALTA) 60 MG capsule Take 60 mg by mouth daily.   Yes [provider]  folic acid (FOLVITE) 1 MG tablet Take 1 mg by mouth daily. 03/19/21  Yes [provider]  Golimumab ([place] ARIA IV) Inject 1 Dose into the vein every 8 (eight) weeks.   Yes [provider]  ibuprofen (ADVIL) 800 MG tablet Take 800 mg by mouth every 8 (eight) hours as needed for moderate pain. 05/17/21  Yes [provider]  levothyroxine (SYNTHROID, [place]) 25  MCG tablet Take 25 mcg by mouth daily before breakfast.  06/26/18  Yes [provider]  Methotrexate Sodium (METHOTREXATE, PF,) 50 MG/2ML injection Inject 0.7 mLs into the muscle once a week.   Yes [provider]  omeprazole (PRILOSEC) 20 MG capsule Take 20 mg by mouth daily as needed (acid reflux). 02/07/22  Yes [provider]  predniSONE  (DELTASONE) 5 MG tablet Take 5-10 mg by mouth daily as needed (arthritis).   Yes [provider]  rosuvastatin (CRESTOR) 5 MG tablet Take 1 tablet (5 mg total) by mouth daily. 03/18/22  Yes [name], MD  tamsulosin (FLOMAX) 0.4 MG CAPS capsule Take 0.4 mg by mouth daily.   Yes [provider]  Varenicline Tartrate, Starter, (CHANTIX STARTING MONTH PAK) 0.5 MG X 11 & 1 MG X 42 TBPK Days 1 to 3: 0.5 mg once daily. Days 4 to 7: 0.5 mg twice daily. Maintenance (day 8 and later): 1 mg twice daily 06/07/22  Yes [name], MD    Allergies as of 07/02/2022   (No Known Allergies)    Family History  Problem Relation Age of Onset   Heart attack Mother    Colon cancer Neg Hx     Social History   Socioeconomic History   Marital status: Married    Spouse name: Not on file   Number of children: Not on file   Years of education: Not on file   Highest education level: Not on file  Occupational History   Not on file  Tobacco Use   Smoking status: Every Day    Packs/day: 1.00    Years: 32.00    Total pack years: 32.00    Types: Cigarettes    Passive exposure: Current   Smokeless tobacco: Never  Vaping Use   Vaping Use: Never used  Substance and Sexual Activity   Alcohol use: Not Currently    Alcohol/week: 16.0 standard drinks of alcohol    Types: 6 Glasses of wine, 10 Cans of beer per week    Comment: None currently (last ETOH 03/01/2018); previously drinks 3 to 4 beers daily and 1 glass of wine; "way more" on the weekend   Drug use: Yes    Types: Marijuana    Comment: hx of marijuana use years ago    Sexual activity: Not Currently  Other Topics Concern   Not on file  Social History Narrative   Not on file   Social Determinants of Health   Financial Resource Strain: Not on file  Food Insecurity: No Food Insecurity (06/01/2022)   Hunger Vital Sign    Worried About Running Out of Food in the Last Year: Never true    Ran Out of Food in the Last  Year: Never true  Transportation Needs: No Transportation Needs (06/01/2022)   PRAPARE - Hydrologist (Medical): No    Lack of Transportation (Non-Medical): No  Physical Activity: Not on file  Stress: Not on file  Social Connections: Not on file  Intimate Partner Violence: Not At Risk (06/01/2022)   Humiliation, Afraid, Rape, and Kick questionnaire    Fear of Current or Ex-Partner: No    Emotionally Abused: No    Physically Abused: No    Sexually Abused: No    Review of Systems: See HPI, otherwise negative ROS  Physical Exam: BP 105/63 (BP Location: Right Arm, Patient Position: Sitting, Cuff Size: Normal)   Pulse 74   Temp 98 F (36.7  C) (Oral)   Ht 5' 11"$  (1.803 m)   Wt 167 lb (75.8 kg)   SpO2 94%   BMI 23.29 kg/m  General:   Alert,   pleasant and cooperative in NAD.  "Hand" appearance. Neck:  Supple; no masses or thyromegaly. No significant cervical adenopathy. Lungs:  Clear throughout to auscultation.   No wheezes, crackles, or rhonchi. No acute distress. Heart:  Regular rate and rhythm; no murmurs, clicks, rubs,  or gallops. Abdomen: Non-distended, normal bowel sounds.  Soft and nontender without appreciable mass or hepatosplenomegaly.  Pulses:  Normal pulses noted. Extremities:  Without clubbing or edema.  Impression/Plan: 57 year old gentleman with newly diagnosed genetic hemochromatosis (homozygous C282Y);  ferritin has nearly halved since  beginning phlebotomies.  Patient states arthritis has improved.  MRI as outlined above.  Will likely continue to screen his liver via the MRI modality in lieu of ultrasound.  Will need his LFTs monitored closely.  Also, baseline alpha-fetoprotein.  It would likely take a good year so for his ferritin to normalize in the 200 range.  GERD well-controlled on omeprazole.  Recommendations:  Stick with Dr. Delton Coombes.  It will take a good year to bring your ferritin down to the normal range (50-100)  Will  periodically be checking liver enzymes and take a picture of your liver periodically.  See  back 6 months  Screening colonoscopy 2031.        Notice: This dictation was prepared with Dragon dictation along with smaller phrase technology. Any transcriptional errors that result from this process are unintentional and may not be corrected upon review.

## 2022-07-02 NOTE — Patient Instructions (Addendum)
It was great seeing you today!  Stick with Dr. Delton Coombes.  It will take a good year to bring your ferritin down to the normal range (50-100)  Will periodically be checking liver enzymes and take a picture of your liver periodically.  See you back 6 months  Screening colonoscopy 2031.

## 2022-07-05 ENCOUNTER — Inpatient Hospital Stay: Payer: 59

## 2022-07-05 NOTE — Patient Instructions (Signed)
Howard [name]  Discharge Instructions: Thank you for choosing [facility] to provide your oncology and hematology care.  If you have a lab appointment with the [facility], please come in thru the Main Entrance and check in at the main information desk.  Wear comfortable clothing and clothing appropriate for easy access to any Portacath or PICC line.   We strive to give you quality time with your provider. You may need to reschedule your appointment if you arrive late (15 or more minutes).  Arriving late affects you and other patients whose appointments are after yours.  Also, if you miss three or more appointments without notifying the office, you may be dismissed from the clinic at the provider's discretion.      For prescription refill requests, have your pharmacy contact our office and allow 72 hours for refills to be completed.    Today you received the following Phlebotomy, return as scheduled.   To help prevent nausea and vomiting after your treatment, we encourage you to take your nausea medication as directed.  BELOW ARE SYMPTOMS THAT SHOULD BE REPORTED IMMEDIATELY: *FEVER GREATER THAN 100.4 F (38 C) OR HIGHER *CHILLS OR SWEATING *NAUSEA AND VOMITING THAT IS NOT CONTROLLED WITH YOUR NAUSEA MEDICATION *UNUSUAL SHORTNESS OF BREATH *UNUSUAL BRUISING OR BLEEDING *URINARY PROBLEMS (pain or burning when urinating, or frequent urination) *BOWEL PROBLEMS (unusual diarrhea, constipation, pain near the anus) TENDERNESS IN MOUTH AND THROAT WITH OR WITHOUT PRESENCE OF ULCERS (sore throat, sores in mouth, or a toothache) UNUSUAL RASH, SWELLING OR PAIN  UNUSUAL VAGINAL DISCHARGE OR ITCHING   Items with * indicate a potential emergency and should be followed up as soon as possible or go to the Emergency Department if any problems should occur.  Please show the CHEMOTHERAPY ALERT CARD or IMMUNOTHERAPY ALERT CARD at check-in to the Emergency Department and  triage nurse.  Should you have questions after your visit or need to cancel or reschedule your appointment, please contact [facility] [phone]  and follow the prompts.  Office hours are 8:00 a.m. to 4:30 p.m. Monday - Friday. Please note that voicemails left after 4:00 p.m. may not be returned until the following business day.  We are closed weekends and major holidays. You have access to a nurse at all times for urgent questions. Please call the main number to the clinic [phone] and follow the prompts.  For any non-urgent questions, you may also contact your provider using MyChart. We now offer e-Visits for anyone 9 and older to request care online for non-urgent symptoms. For details visit [email or web address].   Also download the MyChart app! Go to the app store, search "MyChart", open the app, select [facility], and log in with your MyChart username and password.

## 2022-07-05 NOTE — Progress Notes (Signed)
Howard [name] presents today for phlebotomy per MD orders. Phlebotomy procedure started at 1413 and ended at 1422. 500 cc removed. Patient tolerated procedure well. IV needle removed intact.

## 2022-07-13 ENCOUNTER — Other Ambulatory Visit: Payer: 59

## 2022-07-13 ENCOUNTER — Ambulatory Visit: Payer: 59 | Admitting: Physician Assistant

## 2022-07-14 ENCOUNTER — Other Ambulatory Visit: Payer: Self-pay

## 2022-07-15 ENCOUNTER — Inpatient Hospital Stay (HOSPITAL_BASED_OUTPATIENT_CLINIC_OR_DEPARTMENT_OTHER): Payer: 59 | Admitting: Physician Assistant

## 2022-07-15 ENCOUNTER — Inpatient Hospital Stay: Payer: 59

## 2022-07-15 LAB — CBC WITH DIFFERENTIAL/PLATELET
Abs Immature Granulocytes: 0.01 10*3/uL (ref 0.00–0.07)
Basophils Absolute: 0 10*3/uL (ref 0.0–0.1)
Basophils Relative: 1 %
Eosinophils Absolute: 0.2 10*3/uL (ref 0.0–0.5)
Eosinophils Relative: 2 %
HCT: 36.2 % — ABNORMAL LOW (ref 39.0–52.0)
Hemoglobin: 12 g/dL — ABNORMAL LOW (ref 13.0–17.0)
Immature Granulocytes: 0 %
Lymphocytes Relative: 41 %
Lymphs Abs: 2.7 10*3/uL (ref 0.7–4.0)
MCH: 34 pg (ref 26.0–34.0)
MCHC: 33.1 g/dL (ref 30.0–36.0)
MCV: 102.5 fL — ABNORMAL HIGH (ref 80.0–100.0)
Monocytes Absolute: 0.4 10*3/uL (ref 0.1–1.0)
Monocytes Relative: 6 %
Neutro Abs: 3.3 10*3/uL (ref 1.7–7.7)
Neutrophils Relative %: 50 %
Platelets: 218 10*3/uL (ref 150–400)
RBC: 3.53 MIL/uL — ABNORMAL LOW (ref 4.22–5.81)
RDW: 14.6 % (ref 11.5–15.5)
WBC: 6.6 10*3/uL (ref 4.0–10.5)
nRBC: 0 % (ref 0.0–0.2)

## 2022-07-15 LAB — COMPREHENSIVE METABOLIC PANEL
ALT: 38 U/L (ref 0–44)
AST: 34 U/L (ref 15–41)
Albumin: 3.8 g/dL (ref 3.5–5.0)
Alkaline Phosphatase: 92 U/L (ref 38–126)
Anion gap: 6 (ref 5–15)
BUN: 17 mg/dL (ref 6–20)
CO2: 28 mmol/L (ref 22–32)
Calcium: 8.6 mg/dL — ABNORMAL LOW (ref 8.9–10.3)
Chloride: 103 mmol/L (ref 98–111)
Creatinine, Ser: 0.5 mg/dL — ABNORMAL LOW (ref 0.61–1.24)
GFR, Estimated: >60 mL/min (ref >60)
Glucose, Bld: 103 mg/dL — ABNORMAL HIGH (ref 70–99)
Potassium: 4.1 mmol/L (ref 3.5–5.1)
Sodium: 137 mmol/L (ref 135–145)
Total Bilirubin: 0.5 mg/dL (ref 0.3–1.2)
Total Protein: 5.8 g/dL — ABNORMAL LOW (ref 6.5–8.1)

## 2022-07-15 LAB — VITAMIN B12: Vitamin B-12: 548 pg/mL (ref 180–914)

## 2022-07-15 LAB — FERRITIN: Ferritin: 1127 ng/mL — ABNORMAL HIGH (ref 24–336)

## 2022-07-15 LAB — FOLATE: Folate: >40 ng/mL (ref >5.9)

## 2022-07-15 NOTE — Progress Notes (Signed)
[facility] [address] [place] [address]   CLINIC:  Medical Oncology/Hematology  PROGRESS NOTE:  Patient Care Team: Redmond School, MD as PCP - General (Internal Medicine) [name], MD as PCP - Cardiology (Cardiology) [name], MD as Consulting Physician (Gastroenterology)  CHIEF COMPLAINT: Hereditary Hemochromatosis-Homozygous mutation involving C282Y gene  INTERVAL HISTORY: --Started weekly therapeutic phlebotomies on 06/07/2022.   Howard [name] 57 y.o. male returns for a follow up for hereditary hemochromatosis. He was last seen by Dr. Delton Coombes and Tarri Abernethy PA-C on 06/01/2022 to establish care. In the interim, he has started weekly therapeutic phlebotomies.   [name] reports that his energy levels have noticeably improved with the phlebotomies. He is able to think clearer and mental acuity has improved. His joint pain has improved. He denies any dizziness or headaches with the phlebotomies. He is otherwise stable. He denies nausea, vomiting or abdominal pain. His bowel habits are unchanged without recurrent episodes of diarrhea or constipation. He denies easy bruising or signs of active bleeding. He denies fevers, chills, sweats, shortness of breath, chest pain or cough. He has no other complaints.   MEDICAL HISTORY:  Past Medical History:  Diagnosis Date   Alcohol abuse    Anxiety    Chronic back pain    Chronic pain    Depression    Fall    Fracture of right elbow    Hereditary hemochromatosis ([place]) 06/01/2022   Osteoarthritis    Psoriasis     SURGICAL HISTORY: Past Surgical History:  Procedure Laterality Date   APPENDECTOMY     COLONOSCOPY WITH PROPOFOL N/A 06/07/2019   diverticulosis, otherwise normal. Non-bleeding internal hemorrhoids.    FOOT SURGERY     per patient: "neurofibroma excision"   LUMBAR LAMINECTOMY/DECOMPRESSION MICRODISCECTOMY Bilateral 12/31/2015   Procedure: LUMBER DECOMPRESSION  L4-5/ L5-S1 BILATERALLY  ;  Surgeon: Susa Day, MD;  Location: [facility];  Service: Orthopedics;  Laterality: Bilateral;   REVERSE SHOULDER ARTHROPLASTY Right 10/24/2018   REVERSE SHOULDER ARTHROPLASTY Right 10/24/2018   Procedure: REVERSE SHOULDER ARTHROPLASTY;  Surgeon: Nicholes Stairs, MD;  Location: [facility];  Service: Orthopedics;  Laterality: Right;   SHOULDER SURGERY Right    TOTAL HIP ARTHROPLASTY Right    TOTAL HIP ARTHROPLASTY Left 08/27/2014   Procedure: LEFT TOTAL HIP ARTHROPLASTY ANTERIOR APPROACH;  Surgeon: Paralee Cancel, MD;  Location: [facility];  Service: Orthopedics;  Laterality: Left;   XI ROBOTIC ASSISTED INGUINAL HERNIA REPAIR WITH MESH Right 05/12/2022   Procedure: XI ROBOTIC ASSISTED INGUINAL HERNIA REPAIR WITH MESH, RIGHT;  Surgeon: Aviva Signs, MD;  Location: [facility];  Service: General;  Laterality: Right;    SOCIAL HISTORY: Social History   Socioeconomic History   Marital status: Married    Spouse name: Not on file   Number of children: Not on file   Years of education: Not on file   Highest education level: Not on file  Occupational History   Not on file  Tobacco Use   Smoking status: Every Day    Packs/day: 1.00    Years: 32.00    Total pack years: 32.00    Types: Cigarettes    Passive exposure: Current   Smokeless tobacco: Never  Vaping Use   Vaping Use: Never used  Substance and Sexual Activity   Alcohol use: Not Currently    Alcohol/week: 16.0 standard drinks of alcohol    Types: 6 Glasses of wine, 10 Cans of beer per week  Comment: None currently (last ETOH 03/01/2018); previously drinks 3 to 4 beers daily and 1 glass of wine; "way more" on the weekend   Drug use: Yes    Types: Marijuana    Comment: hx of marijuana use years ago    Sexual activity: Not Currently  Other Topics Concern   Not on file  Social History Narrative   Not on file   Social Determinants of Health   Financial Resource Strain: Not on file  Food Insecurity: No Food  Insecurity (06/01/2022)   Hunger Vital Sign    Worried About Running Out of Food in the Last Year: Never true    Ran Out of Food in the Last Year: Never true  Transportation Needs: No Transportation Needs (06/01/2022)   PRAPARE - Hydrologist (Medical): No    Lack of Transportation (Non-Medical): No  Physical Activity: Not on file  Stress: Not on file  Social Connections: Not on file  Intimate Partner Violence: Not At Risk (06/01/2022)   Humiliation, Afraid, Rape, and Kick questionnaire    Fear of Current or Ex-Partner: No    Emotionally Abused: No    Physically Abused: No    Sexually Abused: No    FAMILY HISTORY: Family History  Problem Relation Age of Onset   Heart attack Mother    Colon cancer Neg Hx     ALLERGIES:  has No Known Allergies.  MEDICATIONS:  Current Outpatient Medications  Medication Sig Dispense Refill   ALPRAZolam (XANAX) 0.5 MG tablet Take 0.5 mg by mouth 2 (two) times daily as needed for anxiety.     amphetamine-dextroamphetamine (ADDERALL) 20 MG tablet Take 20 mg by mouth 2 (two) times daily.     Buprenorphine HCl-Naloxone HCl 8-2 MG FILM Place under the tongue.     DULoxetine (CYMBALTA) 30 MG capsule Take 30 mg by mouth daily.     DULoxetine (CYMBALTA) 60 MG capsule Take 60 mg by mouth daily.     folic acid (FOLVITE) 1 MG tablet Take 1 mg by mouth daily.     Golimumab (SIMPONI ARIA IV) Inject 1 Dose into the vein every 8 (eight) weeks.     ibuprofen (ADVIL) 800 MG tablet Take 800 mg by mouth every 8 (eight) hours as needed for moderate pain.     levothyroxine (SYNTHROID, LEVOTHROID) 25 MCG tablet Take 25 mcg by mouth daily before breakfast.      Methotrexate Sodium (METHOTREXATE, PF,) 50 MG/2ML injection Inject 0.7 mLs into the muscle once a week.     omeprazole (PRILOSEC) 20 MG capsule Take 20 mg by mouth daily as needed (acid reflux).     predniSONE (DELTASONE) 5 MG tablet Take 5-10 mg by mouth daily as needed (arthritis).      rosuvastatin (CRESTOR) 5 MG tablet Take 1 tablet (5 mg total) by mouth daily. 90 tablet 3   tamsulosin (FLOMAX) 0.4 MG CAPS capsule Take 0.4 mg by mouth daily.     Varenicline Tartrate, Starter, (CHANTIX STARTING MONTH PAK) 0.5 MG X 11 & 1 MG X 42 TBPK Days 1 to 3: 0.5 mg once daily. Days 4 to 7: 0.5 mg twice daily. Maintenance (day 8 and later): 1 mg twice daily 1 each 0   No current facility-administered medications for this visit.    REVIEW OF SYSTEMS:    Review of Systems  Constitutional:  Positive for fatigue. Negative for appetite change, chills, diaphoresis, fever and unexpected weight change.  HENT:   Negative  for lump/mass and nosebleeds.   Eyes:  Negative for eye problems.  Respiratory:  Negative for cough, hemoptysis and shortness of breath.   Cardiovascular:  Negative for chest pain, leg swelling and palpitations.  Gastrointestinal:  Negative for abdominal pain, blood in stool, constipation, diarrhea, nausea and vomiting.  Genitourinary:  Negative for hematuria.   Musculoskeletal:  Positive for arthralgias.  Skin: Negative.   Neurological:  Negative for dizziness, headaches and light-headedness.  Hematological:  Does not bruise/bleed easily.  Psychiatric/Behavioral:  Positive for depression. The patient is nervous/anxious.       PHYSICAL EXAMINATION:   ECOG PERFORMANCE STATUS: 1 - Symptomatic but completely ambulatory  Vitals:   07/15/22 1116  BP: 94/78  Pulse: 77  Resp: 18  Temp: 97.8 F (36.6 C)  SpO2: 98%   Filed Weights   07/15/22 1116  Weight: 165 lb 6.4 oz (75 kg)    Physical Exam Constitutional:      Appearance: Normal appearance.  HENT:     Head: Normocephalic and atraumatic.  Eyes:     Extraocular Movements: Extraocular movements intact.     Pupils: Pupils are equal, round, and reactive to light.  Cardiovascular:     Rate and Rhythm: Normal rate and regular rhythm.     Pulses: Normal pulses.     Heart sounds: Normal heart sounds.  Pulmonary:      Effort: Pulmonary effort is normal.     Breath sounds: Normal breath sounds.  Musculoskeletal:        General: No swelling.     Right lower leg: No edema.     Left lower leg: No edema.  Lymphadenopathy:     Cervical: No cervical adenopathy.  Skin:    General: Skin is warm and dry.  Neurological:     General: No focal deficit present.     Mental Status: He is alert and oriented to person, place, and time.  Psychiatric:        Mood and Affect: Mood normal.        Behavior: Behavior normal.       LABORATORY DATA:  I have reviewed the data as listed Recent Results (from the past 2160 hour(s))  Hemochromatosis DNA-PCR(c282y,h63d)     Status: None   Collection Time: 04/19/22  9:33 AM  Result Value Ref Range   DNA MUTATION ANALYSIS See Below     Comment: RESULT: POSITIVE FOR TWO COPIES OF THE HFE GENE PATHOGENIC VARIANT: C282Y/C282Y (HOMOZYGOTE) . Interpretation: Two copies of the C282Y pathogenic variant in the HFE gene were detected. This patient is negative for the H63D pathogenic variant. Approximately 60% to 90% of individuals with a biochemical diagnosis of hereditary hemochromatosis (HH) have this genotype. Therefore, this result is consistent with a diagnosis of [place] in an individual with clinical evidence of HH. However, this genotype does not predict a diagnosis of HH in an asymptomatic individual, as not all individuals with this genotype will develop symptoms or clinical evidence of this disorder. Disease diagnosis can only be made by demonstration of elevated iron stores. Consider genetic counseling and DNA testing for at-risk family members. . . Laboratory testing supervised and results monitored by Ulla Potash, Ph.D., [facility], [facility]. . . . DETAILED ASSAY INFORMATION: Hereditary hemochromatosis ( HH) is an autosomal recessive disorder of iron metabolism that can result in iron overload and potential organ failure. It is one of the most common  genetic disorders in individuals of European-Caucasian ancestry, with an estimated carrier frequency of 10%. HH is  caused by pathogenic variants in the HFE gene. Most individuals with HH (60-90%) are homozygous for the C282Y pathogenic variant. A smaller percentage of affected individuals are either compound heterozygous for the C282Y and H63D pathogenic variants (3%-8%), or homozygous for the H63D pathogenic variant (approximately 1%). . METHODOLOGY: This assay detects two pathogenic variants in the HFE gene, C282Y (NM [id number]: c.845G>A, p.Cys282Tyr) and H63D (NM [id number]: c.187C>G, p.His63Asp), that are commonly associated with HH. These variants are detected by multiplex-polymerase chain reaction (PCR) amplification, followed by restriction enzyme digestion and capillary electrophoresis. Marland Kitchen LIMITATIONS: This assay d oes not detect other pathogenic variants in the HFE gene that may be associated with HH. Although rare, false positive or false negative results may occur. All results should be interpreted in the context of clinical findings, relevant history, and other laboratory data. . Health care providers, please contact your local [facility]' genetic counselor or call [phone]) for assistance with the interpretation of these results. . This test was developed and its analytical performance characteristics have been determined by [facility]. It has not been cleared or approved by FDA. This assay has been validated pursuant to the CLIA regulations and is used for clinical purposes. . For more information, please refer to http://education.questdiagnostics.com/faq/hemochromatosis. (This link is being provided for informational/educational purposes only.) . A portion of the testing was performed at [facility] . Reviewed and signed by Laboratory testing supervised and results monitored by Ulla Potash, Ph.D., [facility], [facility], Signed on 04/28/2022 at 10:59   Type and screen [facility]     Status: None   Collection Time: 05/07/22  9:19 AM  Result Value Ref Range   ABO/RH(D) A NEG    Antibody Screen NEG    Sample Expiration 05/21/2022,2359    Extend sample reason      NO TRANSFUSIONS OR PREGNANCY IN THE PAST 3 MONTHS Performed at [facility], [address], [place],  [address]   Lipid Profile     Status: None   Collection Time: 06/02/22  9:06 AM  Result Value Ref Range   Cholesterol, Total 115 100 - 199 mg/dL   Triglycerides 53 0 - 149 mg/dL   HDL 50 >39 mg/dL   VLDL Cholesterol Cal 12 5 - 40 mg/dL   LDL Chol Calc (NIH) 53 0 - 99 mg/dL   Chol/HDL Ratio 2.3 0.0 - 5.0 ratio    Comment:                                   T. Chol/HDL Ratio                                             Men  Women                               1/2 Avg.Risk  3.4    3.3                                   Avg.Risk  5.0    4.4  2X Avg.Risk  9.6    7.1                                3X Avg.Risk 23.4   11.0   Hepatic function panel     Status: Abnormal   Collection Time: 06/02/22  9:06 AM  Result Value Ref Range   Total Protein 5.6 (L) 6.0 - 8.5 g/dL   Albumin 4.1 3.8 - 4.9 g/dL   Bilirubin Total 0.3 0.0 - 1.2 mg/dL   Bilirubin, Direct 0.13 0.00 - 0.40 mg/dL   Alkaline Phosphatase 106 44 - 121 IU/L   AST 43 (H) 0 - 40 IU/L   ALT 42 0 - 44 IU/L  Ferritin     Status: Abnormal   Collection Time: 06/07/22  1:15 PM  Result Value Ref Range   Ferritin 1,501 (H) 24 - 336 ng/mL    Comment: Performed at [facility], [address], [place], [place] [address]  CBC with Differential/Platelet     Status: Abnormal   Collection Time: 06/07/22  1:16 PM  Result Value Ref Range   WBC 6.2 4.0 - 10.5 K/uL   RBC 3.84 (L) 4.22 - 5.81 MIL/uL   Hemoglobin 12.5 (L) 13.0 - 17.0 g/dL   HCT 37.5 (L) 39.0 - 52.0 %   MCV 97.7 80.0 - 100.0 fL   MCH 32.6 26.0 - 34.0 pg    MCHC 33.3 30.0 - 36.0 g/dL   RDW 13.2 11.5 - 15.5 %   Platelets 151 150 - 400 K/uL   nRBC 0.0 0.0 - 0.2 %   Neutrophils Relative % 51 %   Neutro Abs 3.2 1.7 - 7.7 K/uL   Lymphocytes Relative 44 %   Lymphs Abs 2.7 0.7 - 4.0 K/uL   Monocytes Relative 2 %   Monocytes Absolute 0.1 0.1 - 1.0 K/uL   Eosinophils Relative 3 %   Eosinophils Absolute 0.2 0.0 - 0.5 K/uL   Basophils Relative 0 %   Basophils Absolute 0.0 0.0 - 0.1 K/uL   RBC Morphology MORPHOLOGY UNREMARKABLE    Smear Review MORPHOLOGY UNREMARKABLE    Abs Immature Granulocytes 0.00 0.00 - 0.07 K/uL    Comment: Performed at [facility], [address] [place], [place] [address]  CBC with Differential/Platelet     Status: Abnormal   Collection Time: 07/15/22 10:52 AM  Result Value Ref Range   WBC 6.6 4.0 - 10.5 K/uL   RBC 3.53 (L) 4.22 - 5.81 MIL/uL   Hemoglobin 12.0 (L) 13.0 - 17.0 g/dL   HCT 36.2 (L) 39.0 - 52.0 %   MCV 102.5 (H) 80.0 - 100.0 fL   MCH 34.0 26.0 - 34.0 pg   MCHC 33.1 30.0 - 36.0 g/dL   RDW 14.6 11.5 - 15.5 %   Platelets 218 150 - 400 K/uL   nRBC 0.0 0.0 - 0.2 %   Neutrophils Relative % 50 %   Neutro Abs 3.3 1.7 - 7.7 K/uL   Lymphocytes Relative 41 %   Lymphs Abs 2.7 0.7 - 4.0 K/uL   Monocytes Relative 6 %   Monocytes Absolute 0.4 0.1 - 1.0 K/uL   Eosinophils Relative 2 %   Eosinophils Absolute 0.2 0.0 - 0.5 K/uL   Basophils Relative 1 %   Basophils Absolute 0.0 0.0 - 0.1 K/uL   Immature Granulocytes 0 %   Abs Immature Granulocytes 0.01 0.00 - 0.07 K/uL    Comment:  Performed at [facility], [address], [place], [place] [address]  Comprehensive metabolic panel     Status: Abnormal   Collection Time: 07/15/22 10:52 AM  Result Value Ref Range   Sodium 137 135 - 145 mmol/L   Potassium 4.1 3.5 - 5.1 mmol/L   Chloride 103 98 - 111 mmol/L   CO2 28 22 - 32 mmol/L   Glucose, Bld 103 (H) 70 - 99 mg/dL    Comment: Glucose reference range applies only to samples taken after fasting for at least 8 hours.    BUN 17 6 - 20 mg/dL   Creatinine, Ser 0.50 (L) 0.61 - 1.24 mg/dL   Calcium 8.6 (L) 8.9 - 10.3 mg/dL   Total Protein 5.8 (L) 6.5 - 8.1 g/dL   Albumin 3.8 3.5 - 5.0 g/dL   AST 34 15 - 41 U/L   ALT 38 0 - 44 U/L   Alkaline Phosphatase 92 38 - 126 U/L   Total Bilirubin 0.5 0.3 - 1.2 mg/dL   GFR, Estimated >60 >60 mL/min    Comment: (NOTE) Calculated using the CKD-EPI Creatinine Equation (2021)    Anion gap 6 5 - 15    Comment: Performed at [facility], [address], [place], [place] [address]    RADIOGRAPHIC STUDIES: I have personally reviewed the radiological images as listed and agreed with the findings in the report. No results found.   ASSESSMENT & PLAN:  1.  Hereditary hemochromatosis, C282Y homozygous - Seen at the request of Dr. Abbey Chatters (gastroenterology) for further evaluation and management of hemochromatosis, which was discovered during workup of transaminitis. Iron panel (04/07/2022) the with ferritin 2828, iron saturation 95%. Hemochromatosis DNA testing revealed C282Y homozygosity (04/19/2022) MRI abdomen (05/24/2022) consistent with severe diffuse hepatic iron deposition disease, without evidence of iron deposition within the spleen, pancreas, or bone marrow. - No family history of hemochromatosis. - Symptomatic iron overload with fatigue and arthralgia - Have discussed with patient the natural course of untreated hereditary hemochromatosis with risk of cirrhosis, hepatocellular carcinoma, heart failure, and arrhythmia.  Iron overload also increases risk of certain bacterial infections. - We have discussed lifestyle modifications such as avoiding alcohol and iron supplementation.  Recent guidelines do not find that moderate red meat intake adversely affects patients with hemochromatosis.  Patient should avoid raw fish or undercooked meat due to risk of certain bacterial infections to thrive and iron rich environments. - We have discussed phlebotomy protocol for treatment of  hemochromatosis: Weekly phlebotomy as tolerated until goal ferritin is reached. If Hgb drops < 11.0, we will decrease frequency of phlebotomy. If patient experiences severe side effects or hemodynamic instability, we will decrease amount taken for phlebotomy. Goal is ferritin < 50 for normalization of iron stores. Once iron stores have normalized, we will decrease frequency of phlebotomy. PLAN: --Labs today show mild anemia with Hgb 12.0, MCV 102.5. Ferritin has improved to 1,127. LFTs have normalized.  --Pending lab results for other causes for anemia including vitamin B12, folate, SPEP/IFE and sFLC. Will follow up with patient  --Proceed with weekly phlebotomies starting next week (as requested by patient).   -- 2.  Tobacco use - Current active smoker, 1 PPD since age 3 - Receives regular CT scans via cardiothoracic/cardiology for monitoring of bicuspid aorta and thoracic aortic aneurysm - CTA chest (12/24/2021) negative for any signs of pulmonary malignancy - PLAN: We discussed importance of smoking cessation and will continue to follow-up on this at future visits. - Patient qualifies for LDCT  scan, but this is unnecessary as he is currently receiving regular cardiac/chest imaging as above.  3.  Other history - PMH: Psoriatic arthritis (on chronic methotrexate), fatty liver disease, history of alcoholism, GERD, chronic pain, bicuspid aorta with thoracic aortic aneurysm, anxiety/depression, osteoarthritis.  Last colonoscopy was in 2021, with recommendations for repeat colonoscopy in 10 years.  - SOCIAL: Lives at home with his wife. He is currently on disability due to his psoriatic arthritis, but previously worked in [employer]. He has a history of alcoholism (drank heavily from age 68 to age 43), but has been sober since 2019. He also reports history of illicit drug use (cocaine, marijuana, opioid pills, fentanyl), but has been clean for the past 4 years. He is a current active smoker, 1 PPD since  age 39.  - FAMILY: No family history of hemochromatosis, blood problems, cancer   All questions were answered. The patient knows to call the clinic with any problems, questions or concerns.  I have spent a total of 30 minutes minutes of face-to-face and non-face-to-face time, preparing to see the patient, performing a medically appropriate examination, counseling and educating the patient, ordering tests/procedures, referring and communicating with other health care professionals, documenting clinical information in the electronic health record, and care coordination.   Dede Query PA-C Dept of Hematology and [place]  Phone: [phone]

## 2022-07-16 LAB — KAPPA/LAMBDA LIGHT CHAINS
Kappa free light chain: 8.4 mg/L (ref 3.3–19.4)
Kappa, lambda light chain ratio: 1.02 (ref 0.26–1.65)
Lambda free light chains: 8.2 mg/L (ref 5.7–26.3)

## 2022-07-21 ENCOUNTER — Other Ambulatory Visit: Payer: Self-pay | Admitting: *Deleted

## 2022-07-21 LAB — PROTEIN ELECTROPHORESIS, SERUM
A/G Ratio: 2.1 — ABNORMAL HIGH (ref 0.7–1.7)
Albumin ELP: 3.6 g/dL (ref 2.9–4.4)
Alpha-1-Globulin: 0.2 g/dL (ref 0.0–0.4)
Alpha-2-Globulin: 0.5 g/dL (ref 0.4–1.0)
Beta Globulin: 0.6 g/dL — ABNORMAL LOW (ref 0.7–1.3)
Gamma Globulin: 0.4 g/dL (ref 0.4–1.8)
Globulin, Total: 1.7 g/dL — ABNORMAL LOW (ref 2.2–3.9)
Total Protein ELP: 5.3 g/dL — ABNORMAL LOW (ref 6.0–8.5)

## 2022-07-21 LAB — METHYLMALONIC ACID, SERUM: Methylmalonic Acid, Quantitative: 167 nmol/L (ref 0–378)

## 2022-07-22 ENCOUNTER — Inpatient Hospital Stay: Payer: 59 | Attending: Hematology

## 2022-07-22 ENCOUNTER — Inpatient Hospital Stay: Payer: 59

## 2022-07-22 LAB — CBC
HCT: 37.4 % — ABNORMAL LOW (ref 39.0–52.0)
Hemoglobin: 12.7 g/dL — ABNORMAL LOW (ref 13.0–17.0)
MCH: 34.3 pg — ABNORMAL HIGH (ref 26.0–34.0)
MCHC: 34 g/dL (ref 30.0–36.0)
MCV: 101.1 fL — ABNORMAL HIGH (ref 80.0–100.0)
Platelets: 172 10*3/uL (ref 150–400)
RBC: 3.7 MIL/uL — ABNORMAL LOW (ref 4.22–5.81)
RDW: 14.1 % (ref 11.5–15.5)
WBC: 6.3 10*3/uL (ref 4.0–10.5)
nRBC: 0 % (ref 0.0–0.2)

## 2022-07-22 NOTE — Patient Instructions (Signed)
[facility]  Discharge Instructions: Thank you for choosing [facility] to provide your oncology and hematology care.  If you have a lab appointment with the Cancer Center, please come in thru the Main Entrance and check in at the main information desk.  Wear comfortable clothing and clothing appropriate for easy access to any Portacath or PICC line.   We strive to give you quality time with your provider. You may need to reschedule your appointment if you arrive late (15 or more minutes).  Arriving late affects you and other patients whose appointments are after yours.  Also, if you miss three or more appointments without notifying the office, you may be dismissed from the clinic at the provider's discretion.      For prescription refill requests, have your pharmacy contact our office and allow 72 hours for refills to be completed.     To help prevent nausea and vomiting after your treatment, we encourage you to take your nausea medication as directed.  BELOW ARE SYMPTOMS THAT SHOULD BE REPORTED IMMEDIATELY: *FEVER GREATER THAN 100.4 F (38 C) OR HIGHER *CHILLS OR SWEATING *NAUSEA AND VOMITING THAT IS NOT CONTROLLED WITH YOUR NAUSEA MEDICATION *UNUSUAL SHORTNESS OF BREATH *UNUSUAL BRUISING OR BLEEDING *URINARY PROBLEMS (pain or burning when urinating, or frequent urination) *BOWEL PROBLEMS (unusual diarrhea, constipation, pain near the anus) TENDERNESS IN MOUTH AND THROAT WITH OR WITHOUT PRESENCE OF ULCERS (sore throat, sores in mouth, or a toothache) UNUSUAL RASH, SWELLING OR PAIN  UNUSUAL VAGINAL DISCHARGE OR ITCHING   Items with * indicate a potential emergency and should be followed up as soon as possible or go to the Emergency Department if any problems should occur.  Please show the CHEMOTHERAPY ALERT CARD or IMMUNOTHERAPY ALERT CARD at check-in to the Emergency Department and triage nurse.  Should you have questions after your visit or need to  cancel or reschedule your appointment, please contact [facility] [phone]  and follow the prompts.  Office hours are 8:00 a.m. to 4:30 p.m. Monday - Friday. Please note that voicemails left after 4:00 p.m. may not be returned until the following business day.  We are closed weekends and major holidays. You have access to a nurse at all times for urgent questions. Please call the main number to the clinic [phone] and follow the prompts.  For any non-urgent questions, you may also contact your provider using MyChart. We now offer e-Visits for anyone 18 and older to request care online for non-urgent symptoms. For details visit [email or web address].   Also download the MyChart app! Go to the app store, search "MyChart", open the app, select , and log in with your MyChart username and password.   

## 2022-07-22 NOTE — Progress Notes (Signed)
Patient presents today for therapeutic phlebotomy. Patient in satisfactory condition with no new complaints voiced. Vital signs are stable. Labs done-Hgb 12.7 Hct 37.4-within parameters. Phlebotomy started at 1418, ended at 1424. 578m removed from left AC. Patient tolerated procedure well. Vital signs remain stable. Patient refused recommend wait time. Patient left ambulatory and in stable condition.

## 2022-07-23 LAB — IMMUNOFIXATION ELECTROPHORESIS
IgA: 93 mg/dL (ref 90–386)
IgG (Immunoglobin G), Serum: 371 mg/dL — ABNORMAL LOW (ref 603–1613)
IgM (Immunoglobulin M), Srm: 59 mg/dL (ref 20–172)
Total Protein ELP: 5.4 g/dL — ABNORMAL LOW (ref 6.0–8.5)

## 2022-07-29 ENCOUNTER — Other Ambulatory Visit: Payer: Self-pay

## 2022-07-30 ENCOUNTER — Inpatient Hospital Stay: Payer: 59

## 2022-07-30 LAB — CBC
HCT: 36.1 % — ABNORMAL LOW (ref 39.0–52.0)
Hemoglobin: 12.1 g/dL — ABNORMAL LOW (ref 13.0–17.0)
MCH: 34.5 pg — ABNORMAL HIGH (ref 26.0–34.0)
MCHC: 33.5 g/dL (ref 30.0–36.0)
MCV: 102.8 fL — ABNORMAL HIGH (ref 80.0–100.0)
Platelets: 189 10*3/uL (ref 150–400)
RBC: 3.51 MIL/uL — ABNORMAL LOW (ref 4.22–5.81)
RDW: 14 % (ref 11.5–15.5)
WBC: 7.5 10*3/uL (ref 4.0–10.5)
nRBC: 0 % (ref 0.0–0.2)

## 2022-07-30 NOTE — Patient Instructions (Signed)
[facility]  Discharge Instructions: Thank you for choosing [facility] to provide your oncology and hematology care.  If you have a lab appointment with the [facility], please come in thru the Main Entrance and check in at the main information desk.  Wear comfortable clothing and clothing appropriate for easy access to any Portacath or PICC line.   We strive to give you quality time with your provider. You may need to reschedule your appointment if you arrive late (15 or more minutes).  Arriving late affects you and other patients whose appointments are after yours.  Also, if you miss three or more appointments without notifying the office, you may be dismissed from the clinic at the provider's discretion.      For prescription refill requests, have your pharmacy contact our office and allow 72 hours for refills to be completed.    Today you received the following phlebotomy.    To help prevent nausea and vomiting after your treatment, we encourage you to take your nausea medication as directed.  BELOW ARE SYMPTOMS THAT SHOULD BE REPORTED IMMEDIATELY: *FEVER GREATER THAN 100.4 F (38 C) OR HIGHER *CHILLS OR SWEATING *NAUSEA AND VOMITING THAT IS NOT CONTROLLED WITH YOUR NAUSEA MEDICATION *UNUSUAL SHORTNESS OF BREATH *UNUSUAL BRUISING OR BLEEDING *URINARY PROBLEMS (pain or burning when urinating, or frequent urination) *BOWEL PROBLEMS (unusual diarrhea, constipation, pain near the anus) TENDERNESS IN MOUTH AND THROAT WITH OR WITHOUT PRESENCE OF ULCERS (sore throat, sores in mouth, or a toothache) UNUSUAL RASH, SWELLING OR PAIN  UNUSUAL VAGINAL DISCHARGE OR ITCHING   Items with * indicate a potential emergency and should be followed up as soon as possible or go to the Emergency Department if any problems should occur.  Please show the CHEMOTHERAPY ALERT CARD or IMMUNOTHERAPY ALERT CARD at check-in to the Emergency Department and triage nurse.  Should  you have questions after your visit or need to cancel or reschedule your appointment, please contact [facility] [phone]  and follow the prompts.  Office hours are 8:00 a.m. to 4:30 p.m. Monday - Friday. Please note that voicemails left after 4:00 p.m. may not be returned until the following business day.  We are closed weekends and major holidays. You have access to a nurse at all times for urgent questions. Please call the main number to the clinic [phone] and follow the prompts.  For any non-urgent questions, you may also contact your provider using MyChart. We now offer e-Visits for anyone 63 and older to request care online for non-urgent symptoms. For details visit [email or web address].   Also download the MyChart app! Go to the app store, search "MyChart", open the app, select [facility], and log in with your MyChart username and password.

## 2022-07-30 NOTE — Progress Notes (Signed)
Patient presents today to have phlebotomy done. Patient arrives in satisfactory condition with no new complaints. Patient had 528mL removed via left arm. Tolerated well. Start time 1:10, end 1:14. Patient refuses to wait 30 minute wait time but did wait 10 minutes. Patient ambulatory and left in satisfactory condition.

## 2022-08-04 ENCOUNTER — Other Ambulatory Visit: Payer: Self-pay

## 2022-08-05 ENCOUNTER — Inpatient Hospital Stay: Payer: 59

## 2022-08-05 ENCOUNTER — Telehealth: Payer: Self-pay

## 2022-08-05 LAB — CBC
HCT: 35.9 % — ABNORMAL LOW (ref 39.0–52.0)
Hemoglobin: 11.9 g/dL — ABNORMAL LOW (ref 13.0–17.0)
MCH: 34.1 pg — ABNORMAL HIGH (ref 26.0–34.0)
MCHC: 33.1 g/dL (ref 30.0–36.0)
MCV: 102.9 fL — ABNORMAL HIGH (ref 80.0–100.0)
Platelets: 194 10*3/uL (ref 150–400)
RBC: 3.49 MIL/uL — ABNORMAL LOW (ref 4.22–5.81)
RDW: 13.9 % (ref 11.5–15.5)
WBC: 5.9 10*3/uL (ref 4.0–10.5)
nRBC: 0 % (ref 0.0–0.2)

## 2022-08-05 NOTE — Telephone Encounter (Signed)
Message left on patients voicemail he does not need a phlebotomy on 08/06/22 per provider.

## 2022-08-06 ENCOUNTER — Inpatient Hospital Stay: Payer: 59

## 2022-08-11 ENCOUNTER — Other Ambulatory Visit: Payer: Self-pay

## 2022-08-12 ENCOUNTER — Inpatient Hospital Stay: Payer: 59

## 2022-08-12 LAB — CBC
HCT: 36.6 % — ABNORMAL LOW (ref 39.0–52.0)
Hemoglobin: 12.6 g/dL — ABNORMAL LOW (ref 13.0–17.0)
MCH: 34.5 pg — ABNORMAL HIGH (ref 26.0–34.0)
MCHC: 34.4 g/dL (ref 30.0–36.0)
MCV: 100.3 fL — ABNORMAL HIGH (ref 80.0–100.0)
Platelets: 190 10*3/uL (ref 150–400)
RBC: 3.65 MIL/uL — ABNORMAL LOW (ref 4.22–5.81)
RDW: 13.2 % (ref 11.5–15.5)
WBC: 6.1 10*3/uL (ref 4.0–10.5)
nRBC: 0 % (ref 0.0–0.2)

## 2022-08-12 NOTE — Progress Notes (Signed)
Patient presents today for therapeutic phlebotomy.  Patient is in satisfactory condition with no new complaints voiced.  Vital signs are stable.  Hemoglobin today is 12.6.  We will proceed with phlebotomy per provider orders.  Therapeutic phlebotomy started at 1411 and ended at 1419.  Patient tolerated procedure well.  Vital signs remained stable.  Patient refused to wait the recommended 30 minute post phlebotomy wait time.  Patient stayed about 10 minutes.  Patient left ambulatory in stable condition.

## 2022-08-12 NOTE — Patient Instructions (Signed)
[facility]  Discharge Instructions: Thank you for choosing [facility] to provide your oncology and hematology care.  If you have a lab appointment with the Cancer Center, please come in thru the Main Entrance and check in at the main information desk.  Wear comfortable clothing and clothing appropriate for easy access to any Portacath or PICC line.   We strive to give you quality time with your provider. You may need to reschedule your appointment if you arrive late (15 or more minutes).  Arriving late affects you and other patients whose appointments are after yours.  Also, if you miss three or more appointments without notifying the office, you may be dismissed from the clinic at the provider's discretion.      For prescription refill requests, have your pharmacy contact our office and allow 72 hours for refills to be completed.    To help prevent nausea and vomiting after your treatment, we encourage you to take your nausea medication as directed.  BELOW ARE SYMPTOMS THAT SHOULD BE REPORTED IMMEDIATELY: *FEVER GREATER THAN 100.4 F (38 C) OR HIGHER *CHILLS OR SWEATING *NAUSEA AND VOMITING THAT IS NOT CONTROLLED WITH YOUR NAUSEA MEDICATION *UNUSUAL SHORTNESS OF BREATH *UNUSUAL BRUISING OR BLEEDING *URINARY PROBLEMS (pain or burning when urinating, or frequent urination) *BOWEL PROBLEMS (unusual diarrhea, constipation, pain near the anus) TENDERNESS IN MOUTH AND THROAT WITH OR WITHOUT PRESENCE OF ULCERS (sore throat, sores in mouth, or a toothache) UNUSUAL RASH, SWELLING OR PAIN  UNUSUAL VAGINAL DISCHARGE OR ITCHING   Items with * indicate a potential emergency and should be followed up as soon as possible or go to the Emergency Department if any problems should occur.  Please show the CHEMOTHERAPY ALERT CARD or IMMUNOTHERAPY ALERT CARD at check-in to the Emergency Department and triage nurse.  Should you have questions after your visit or need to  cancel or reschedule your appointment, please contact [facility] [phone]  and follow the prompts.  Office hours are 8:00 a.m. to 4:30 p.m. Monday - Friday. Please note that voicemails left after 4:00 p.m. may not be returned until the following business day.  We are closed weekends and major holidays. You have access to a nurse at all times for urgent questions. Please call the main number to the clinic [phone] and follow the prompts.  For any non-urgent questions, you may also contact your provider using MyChart. We now offer e-Visits for anyone 18 and older to request care online for non-urgent symptoms. For details visit [email or web address].   Also download the MyChart app! Go to the app store, search "MyChart", open the app, select [facility], and log in with your MyChart username and password.   

## 2022-08-19 ENCOUNTER — Other Ambulatory Visit: Payer: Self-pay

## 2022-08-20 ENCOUNTER — Inpatient Hospital Stay: Payer: 59 | Attending: Hematology

## 2022-08-20 ENCOUNTER — Inpatient Hospital Stay: Payer: 59

## 2022-08-20 LAB — CBC WITH DIFFERENTIAL/PLATELET
Abs Immature Granulocytes: 0 10*3/uL (ref 0.00–0.07)
Basophils Absolute: 0 10*3/uL (ref 0.0–0.1)
Basophils Relative: 0 %
Eosinophils Absolute: 0 10*3/uL (ref 0.0–0.5)
Eosinophils Relative: 0 %
HCT: 36.1 % — ABNORMAL LOW (ref 39.0–52.0)
Hemoglobin: 12.1 g/dL — ABNORMAL LOW (ref 13.0–17.0)
Immature Granulocytes: 0 %
Lymphocytes Relative: 52 %
Lymphs Abs: 2.5 10*3/uL (ref 0.7–4.0)
MCH: 34.3 pg — ABNORMAL HIGH (ref 26.0–34.0)
MCHC: 33.5 g/dL (ref 30.0–36.0)
MCV: 102.3 fL — ABNORMAL HIGH (ref 80.0–100.0)
Monocytes Absolute: 0.4 10*3/uL (ref 0.1–1.0)
Monocytes Relative: 9 %
Neutro Abs: 1.9 10*3/uL (ref 1.7–7.7)
Neutrophils Relative %: 39 %
Platelets: 156 10*3/uL (ref 150–400)
RBC: 3.53 MIL/uL — ABNORMAL LOW (ref 4.22–5.81)
RDW: 13.5 % (ref 11.5–15.5)
WBC: 4.8 10*3/uL (ref 4.0–10.5)
nRBC: 0 % (ref 0.0–0.2)

## 2022-08-20 LAB — IRON AND TIBC
Iron: 157 ug/dL (ref 45–182)
Saturation Ratios: 88 % — ABNORMAL HIGH (ref 17.9–39.5)
TIBC: 179 ug/dL — ABNORMAL LOW (ref 250–450)
UIBC: 22 ug/dL

## 2022-08-20 LAB — FERRITIN: Ferritin: 1459 ng/mL — ABNORMAL HIGH (ref 24–336)

## 2022-08-20 NOTE — Patient Instructions (Signed)
[facility]  Discharge Instructions: Thank you for choosing [facility] to provide your oncology and hematology care.  If you have a lab appointment with the Cancer Center - please note that after April 8th, 2024, all labs will be drawn in the cancer center.  You do not have to check in or register with the main entrance as you have in the past but will complete your check-in in the cancer center.  Wear comfortable clothing and clothing appropriate for easy access to any Portacath or PICC line.   We strive to give you quality time with your provider. You may need to reschedule your appointment if you arrive late (15 or more minutes).  Arriving late affects you and other patients whose appointments are after yours.  Also, if you miss three or more appointments without notifying the office, you may be dismissed from the clinic at the provider's discretion.      For prescription refill requests, have your pharmacy contact our office and allow 72 hours for refills to be completed.    Today you received the following chemotherapy and/or immunotherapy agents phlebotomy      To help prevent nausea and vomiting after your treatment, we encourage you to take your nausea medication as directed.  BELOW ARE SYMPTOMS THAT SHOULD BE REPORTED IMMEDIATELY: *FEVER GREATER THAN 100.4 F (38 C) OR HIGHER *CHILLS OR SWEATING *NAUSEA AND VOMITING THAT IS NOT CONTROLLED WITH YOUR NAUSEA MEDICATION *UNUSUAL SHORTNESS OF BREATH *UNUSUAL BRUISING OR BLEEDING *URINARY PROBLEMS (pain or burning when urinating, or frequent urination) *BOWEL PROBLEMS (unusual diarrhea, constipation, pain near the anus) TENDERNESS IN MOUTH AND THROAT WITH OR WITHOUT PRESENCE OF ULCERS (sore throat, sores in mouth, or a toothache) UNUSUAL RASH, SWELLING OR PAIN  UNUSUAL VAGINAL DISCHARGE OR ITCHING   Items with * indicate a potential emergency and should be followed up as soon as possible or go to the  Emergency Department if any problems should occur.  Please show the CHEMOTHERAPY ALERT CARD or IMMUNOTHERAPY ALERT CARD at check-in to the Emergency Department and triage nurse.  Should you have questions after your visit or need to cancel or reschedule your appointment, please contact [facility] [phone]  and follow the prompts.  Office hours are 8:00 a.m. to 4:30 p.m. Monday - Friday. Please note that voicemails left after 4:00 p.m. may not be returned until the following business day.  We are closed weekends and major holidays. You have access to a nurse at all times for urgent questions. Please call the main number to the clinic [phone] and follow the prompts.  For any non-urgent questions, you may also contact your provider using MyChart. We now offer e-Visits for anyone 6 and older to request care online for non-urgent symptoms. For details visit [email or web address].   Also download the MyChart app! Go to the app store, search "MyChart", open the app, select [facility], and log in with your MyChart username and password.

## 2022-08-20 NOTE — Progress Notes (Signed)
Patient presents today for phlebotomy per MD orders. Phlebotomy procedure started at 1318 and ended at 1323. 500 cc removed. Patient tolerated procedure well. Procedure tolerated well and without incident. Discharged ambulatory in stable condition.

## 2022-08-26 ENCOUNTER — Inpatient Hospital Stay: Payer: 59 | Admitting: Physician Assistant

## 2022-09-06 NOTE — Progress Notes (Unsigned)
[facility] [address], [place] [address]   CLINIC:  Medical Oncology/Hematology  PCP:  Elfredia Nevins, MD [address] [place] [address] [phone]   REASON FOR VISIT:  Follow-up for hereditary hemochromatosis (C282Y homozygous)  CURRENT THERAPY: Therapeutic phlebotomy  INTERVAL HISTORY:   [name] 57 y.o. male returns for routine follow-up of hereditary hemochromatosis.  He was last seen on 07/15/2022 by Georga Kaufmann PA-C.  At today's visit, he reports feeling fairly well.  He had COVID-19 earlier this month (April 2024).  He missed the last 2 phlebotomies due to his COVID infection, but otherwise has been receiving weekly phlebotomy since January 2024.  He has been tolerating phlebotomy well.  He has some mild fatigue and headache the day after phlebotomy, but overall notes improved energy, decreased joint pain, and improved mental clarity since starting phlebotomy.  He reports some mild nausea in the mornings.  He denies any abdominal pain.  He denies any evidence of active bleeding.  He has 75% energy and 90% appetite. He endorses that he is maintaining a stable weight.   ASSESSMENT & PLAN:  1.  Hereditary hemochromatosis, C282Y homozygous - Seen at the request of Dr. Marletta Lor (gastroenterology) for further evaluation and management of hemochromatosis, which was discovered during workup of transaminitis. Iron panel (04/07/2022) the with ferritin 2828, iron saturation 95%. Hemochromatosis DNA testing revealed C282Y homozygosity (04/19/2022) MRI abdomen (05/24/2022) consistent with severe diffuse hepatic iron deposition disease, without evidence of iron deposition within the spleen, pancreas, or bone marrow. - No family history of hemochromatosis. - Symptomatic iron overload with fatigue and arthralgia.  Symptoms have improved after starting phlebotomy.   - Weekly phlebotomy started in January 2024.   - Most recent labs (08/20/2022): Hgb  12.1/MCV 102.3, ferritin 1459 (may have been falsely elevated due to recent COVID), iron saturation 88% - Have discussed with patient the natural course of untreated hereditary hemochromatosis with risk of cirrhosis, hepatocellular carcinoma, heart failure, and arrhythmia.  Iron overload also increases risk of certain bacterial infections. - We have discussed lifestyle modifications such as avoiding alcohol and iron supplementation.  Recent guidelines do not find that moderate red meat intake adversely affects patients with hemochromatosis.  Patient should avoid raw fish or undercooked meat due to risk of certain bacterial infections to thrive and iron rich environments. - We have discussed phlebotomy protocol for treatment of hemochromatosis: Weekly phlebotomy as tolerated until goal ferritin is reached. If Hgb drops < 11.0, we will decrease frequency of phlebotomy. If patient experiences severe side effects or hemodynamic instability, we will decrease amount taken for phlebotomy. Goal is ferritin < 50 for normalization of iron stores. Once iron stores have normalized, we will decrease frequency of phlebotomy. PLAN: Continue weekly phlebotomy with CBC every 2 weeks - Labs and RTC in 3 months   2.  Mild macrocytic anemia - Noted to have mild normocytic anemia (prior to starting phlebotomy), with Hgb 12.5/MCV 97.7 on 06/07/2022 - Macrocytosis was not present until after patient started receiving regular phlebotomies, suspected to represent compensatory reticulocytosis - Anemia workup (07/15/2022): Normal B12 at 548, normal MMA.  Normal folate. CMP shows normal kidney function. SPEP, immunofixation, light chains negative for any monoclonal gammopathy - Currently receiving weekly phlebotomy for hereditary hemochromatosis, denies any other sources of blood loss.   - PLAN: We will continue to monitor and consider additional testing if any severe worsening of anemia out of proportion to regular  phlebotomies.  3.   Tobacco  use - Current active smoker, 1 PPD since age 43 - Receives regular CT scans via cardiothoracic/cardiology for monitoring of bicuspid aorta and thoracic aortic aneurysm - CTA chest (12/24/2021) negative for any signs of pulmonary malignancy - PLAN: We discussed importance of smoking cessation and will continue to follow-up on this at future visits. - Patient qualifies for LDCT scan, but this is unnecessary as he is currently receiving regular cardiac/chest imaging as above.  4.  Other history - PMH: Psoriatic arthritis (on chronic methotrexate), fatty liver disease, history of alcoholism, GERD, chronic pain, bicuspid aorta with thoracic aortic aneurysm, anxiety/depression, osteoarthritis.  Last colonoscopy was in 2021, with recommendations for repeat colonoscopy in 10 years.  - SOCIAL: Lives at home with his wife. He is currently on disability due to his psoriatic arthritis, but previously worked in [employer]. He has a history of alcoholism (drank heavily from age 89 to age 63), but has been sober since 2019. He also reports history of illicit drug use (cocaine, marijuana, opioid pills, fentanyl), but has been clean for the past 4 years. He is a current active smoker, 1 PPD since age 80.  - FAMILY: No family history of hemochromatosis, blood problems, cancer  PLAN SUMMARY: >> Weekly phlebotomy  >> CBC every 2 weeks >> Labs in 3 months = CBC/D, ferritin, iron/TIBC, reticulocytes >> OFFICE visit in 3 months     REVIEW OF SYSTEMS:   Review of Systems  Constitutional:  Positive for fatigue. Negative for appetite change, chills, diaphoresis, fever and unexpected weight change.  HENT:   Negative for lump/mass and nosebleeds.   Eyes:  Negative for eye problems.  Respiratory:  Negative for cough, hemoptysis and shortness of breath.   Cardiovascular:  Negative for chest pain, leg swelling and palpitations.  Gastrointestinal:  Positive for constipation. Negative for abdominal  pain, blood in stool, diarrhea, nausea and vomiting.  Genitourinary:  Negative for hematuria.   Musculoskeletal:  Positive for arthralgias and back pain.  Skin: Negative.   Neurological:  Positive for headaches and numbness. Negative for dizziness and light-headedness.  Hematological:  Does not bruise/bleed easily.     PHYSICAL EXAM:  ECOG PERFORMANCE STATUS: 1 - Symptomatic but completely ambulatory  There were no vitals filed for this visit. There were no vitals filed for this visit. Physical Exam Constitutional:      Appearance: Normal appearance. He is normal weight.  Cardiovascular:     Heart sounds: Normal heart sounds.  Pulmonary:     Breath sounds: Normal breath sounds.  Musculoskeletal:     Thoracic back: Scoliosis present.  Neurological:     General: No focal deficit present.     Mental Status: Mental status is at baseline.  Psychiatric:        Behavior: Behavior normal. Behavior is cooperative.     PAST MEDICAL/SURGICAL HISTORY:  Past Medical History:  Diagnosis Date   Alcohol abuse    Anxiety    Chronic back pain    Chronic pain    Depression    Fall    Fracture of right elbow    Hereditary hemochromatosis (HCC) 06/01/2022   Osteoarthritis    Psoriasis    Past Surgical History:  Procedure Laterality Date   APPENDECTOMY     COLONOSCOPY WITH PROPOFOL N/A 06/07/2019   diverticulosis, otherwise normal. Non-bleeding internal hemorrhoids.    FOOT SURGERY     per patient: "neurofibroma excision"   LUMBAR LAMINECTOMY/DECOMPRESSION MICRODISCECTOMY Bilateral 12/31/2015   Procedure: LUMBER DECOMPRESSION L4-5/ L5-S1 BILATERALLY  ;  Surgeon: Jene Every, MD;  Location: [facility];  Service: Orthopedics;  Laterality: Bilateral;   REVERSE SHOULDER ARTHROPLASTY Right 10/24/2018   REVERSE SHOULDER ARTHROPLASTY Right 10/24/2018   Procedure: REVERSE SHOULDER ARTHROPLASTY;  Surgeon: Yolonda Kida, MD;  Location: [facility] OR;  Service: Orthopedics;  Laterality: Right;    SHOULDER SURGERY Right    TOTAL HIP ARTHROPLASTY Right    TOTAL HIP ARTHROPLASTY Left 08/27/2014   Procedure: LEFT TOTAL HIP ARTHROPLASTY ANTERIOR APPROACH;  Surgeon: Durene Romans, MD;  Location: [facility];  Service: Orthopedics;  Laterality: Left;   XI ROBOTIC ASSISTED INGUINAL HERNIA REPAIR WITH MESH Right 05/12/2022   Procedure: XI ROBOTIC ASSISTED INGUINAL HERNIA REPAIR WITH MESH, RIGHT;  Surgeon: Franky Macho, MD;  Location: [facility];  Service: General;  Laterality: Right;    SOCIAL HISTORY:  Social History   Socioeconomic History   Marital status: Married    Spouse name: Not on file   Number of children: Not on file   Years of education: Not on file   Highest education level: Not on file  Occupational History   Not on file  Tobacco Use   Smoking status: Every Day    Packs/day: 1.00    Years: 32.00    Additional pack years: 0.00    Total pack years: 32.00    Types: Cigarettes    Passive exposure: Current   Smokeless tobacco: Never  Vaping Use   Vaping Use: Never used  Substance and Sexual Activity   Alcohol use: Not Currently    Alcohol/week: 16.0 standard drinks of alcohol    Types: 6 Glasses of wine, 10 Cans of beer per week    Comment: None currently (last ETOH 03/01/2018); previously drinks 3 to 4 beers daily and 1 glass of wine; "way more" on the weekend   Drug use: Yes    Types: Marijuana    Comment: hx of marijuana use years ago    Sexual activity: Not Currently  Other Topics Concern   Not on file  Social History Narrative   Not on file   Social Determinants of Health   Financial Resource Strain: Not on file  Food Insecurity: No Food Insecurity (06/01/2022)   Hunger Howard [name] Sign    Worried About Running Out of Food in the Last Year: Never true    Ran Out of Food in the Last Year: Never true  Transportation Needs: No Transportation Needs (06/01/2022)   PRAPARE - Administrator, Civil Service (Medical): No    Lack of Transportation (Non-Medical): No   Physical Activity: Not on file  Stress: Not on file  Social Connections: Not on file  Intimate Partner Violence: Not At Risk (06/01/2022)   Humiliation, Afraid, Rape, and Kick questionnaire    Fear of Current or Ex-Partner: No    Emotionally Abused: No    Physically Abused: No    Sexually Abused: No    FAMILY HISTORY:  Family History  Problem Relation Age of Onset   Heart attack Mother    Colon cancer Neg Hx     CURRENT MEDICATIONS:  Outpatient Encounter Medications as of 09/07/2022  Medication Sig   ALPRAZolam (XANAX) 0.5 MG tablet Take 0.5 mg by mouth 2 (two) times daily as needed for anxiety.   amphetamine-dextroamphetamine (ADDERALL) 20 MG tablet Take 20 mg by mouth 2 (two) times daily.   Buprenorphine HCl-Naloxone HCl 8-2 MG FILM Place under the tongue.   DULoxetine (CYMBALTA) 30 MG capsule Take 30 mg by mouth daily.  DULoxetine (CYMBALTA) 60 MG capsule Take 60 mg by mouth daily.   folic acid (FOLVITE) 1 MG tablet Take 1 mg by mouth daily.   Golimumab (SIMPONI ARIA IV) Inject 1 Dose into the vein every 8 (eight) weeks.   ibuprofen (ADVIL) 800 MG tablet Take 800 mg by mouth every 8 (eight) hours as needed for moderate pain.   levothyroxine (SYNTHROID, LEVOTHROID) 25 MCG tablet Take 25 mcg by mouth daily before breakfast.    Methotrexate Sodium (METHOTREXATE, PF,) 50 MG/2ML injection Inject 0.7 mLs into the muscle once a week.   omeprazole (PRILOSEC) 20 MG capsule Take 20 mg by mouth daily as needed (acid reflux).   predniSONE (DELTASONE) 5 MG tablet Take 5-10 mg by mouth daily as needed (arthritis).   rosuvastatin (CRESTOR) 5 MG tablet Take 1 tablet (5 mg total) by mouth daily.   tamsulosin (FLOMAX) 0.4 MG CAPS capsule Take 0.4 mg by mouth daily.   Varenicline Tartrate, Starter, (CHANTIX STARTING MONTH PAK) 0.5 MG X 11 & 1 MG X 42 TBPK Days 1 to 3: 0.5 mg once daily. Days 4 to 7: 0.5 mg twice daily. Maintenance (day 8 and later): 1 mg twice daily   No facility-administered  encounter medications on file as of 09/07/2022.    ALLERGIES:  No Known Allergies  LABORATORY DATA:  I have reviewed the labs as listed.  CBC    Component Value Date/Time   WBC 4.8 08/20/2022 1244   RBC 3.53 (L) 08/20/2022 1244   HGB 12.1 (L) 08/20/2022 1244   HCT 36.1 (L) 08/20/2022 1244   PLT 156 08/20/2022 1244   MCV 102.3 (H) 08/20/2022 1244   MCH 34.3 (H) 08/20/2022 1244   MCHC 33.5 08/20/2022 1244   RDW 13.5 08/20/2022 1244   LYMPHSABS 2.5 08/20/2022 1244   MONOABS 0.4 08/20/2022 1244   EOSABS 0.0 08/20/2022 1244   BASOSABS 0.0 08/20/2022 1244      Latest Ref Rng & Units 07/15/2022   10:52 AM 06/02/2022    9:06 AM 03/11/2022    9:05 AM  CMP  Glucose 70 - 99 mg/dL 846     BUN 6 - 20 mg/dL 17     Creatinine 9.62 - 1.24 mg/dL 9.52     Sodium 841 - 324 mmol/L 137     Potassium 3.5 - 5.1 mmol/L 4.1     Chloride 98 - 111 mmol/L 103     CO2 22 - 32 mmol/L 28     Calcium 8.9 - 10.3 mg/dL 8.6     Total Protein 6.5 - 8.1 g/dL 5.8  5.6  5.7   Total Bilirubin 0.3 - 1.2 mg/dL 0.5  0.3  0.7   Alkaline Phos 38 - 126 U/L 92  106  103   AST 15 - 41 U/L 34  43  58   ALT 0 - 44 U/L 38  42  59     DIAGNOSTIC IMAGING:  I have independently reviewed the relevant imaging and discussed with the patient.   WRAP UP:  All questions were answered. The patient knows to call the clinic with any problems, questions or concerns.  Medical decision making: Low  Time spent on visit: I spent 15 minutes counseling the patient face to face. The total time spent in the appointment was 22 minutes and more than 50% was on counseling.  Carnella Guadalajara, PA-C  09/07/22 8:37 AM

## 2022-09-07 ENCOUNTER — Other Ambulatory Visit: Payer: Self-pay

## 2022-09-07 ENCOUNTER — Inpatient Hospital Stay (HOSPITAL_BASED_OUTPATIENT_CLINIC_OR_DEPARTMENT_OTHER): Payer: 59 | Admitting: Physician Assistant

## 2022-09-14 ENCOUNTER — Inpatient Hospital Stay: Payer: 59

## 2022-09-20 ENCOUNTER — Inpatient Hospital Stay: Payer: 59

## 2022-09-20 ENCOUNTER — Inpatient Hospital Stay: Payer: 59 | Attending: Hematology

## 2022-09-20 LAB — CBC WITH DIFFERENTIAL/PLATELET
Abs Immature Granulocytes: 0.01 10*3/uL (ref 0.00–0.07)
Basophils Absolute: 0 10*3/uL (ref 0.0–0.1)
Basophils Relative: 1 %
Eosinophils Absolute: 0 10*3/uL (ref 0.0–0.5)
Eosinophils Relative: 0 %
HCT: 39.9 % (ref 39.0–52.0)
Hemoglobin: 13.6 g/dL (ref 13.0–17.0)
Immature Granulocytes: 0 %
Lymphocytes Relative: 22 %
Lymphs Abs: 1.5 10*3/uL (ref 0.7–4.0)
MCH: 33.4 pg (ref 26.0–34.0)
MCHC: 34.1 g/dL (ref 30.0–36.0)
MCV: 98 fL (ref 80.0–100.0)
Monocytes Absolute: 0.4 10*3/uL (ref 0.1–1.0)
Monocytes Relative: 5 %
Neutro Abs: 4.9 10*3/uL (ref 1.7–7.7)
Neutrophils Relative %: 72 %
Platelets: 184 10*3/uL (ref 150–400)
RBC: 4.07 MIL/uL — ABNORMAL LOW (ref 4.22–5.81)
RDW: 12.3 % (ref 11.5–15.5)
WBC: 6.9 10*3/uL (ref 4.0–10.5)
nRBC: 0 % (ref 0.0–0.2)

## 2022-09-20 NOTE — Patient Instructions (Signed)
[facility]  Discharge Instructions: Thank you for choosing [facility] to provide your oncology and hematology care.  If you have a lab appointment with the Cancer Center - please note that after April 8th, 2024, all labs will be drawn in the cancer center.  You do not have to check in or register with the main entrance as you have in the past but will complete your check-in in the cancer center.  Wear comfortable clothing and clothing appropriate for easy access to any Portacath or PICC line.   We strive to give you quality time with your provider. You may need to reschedule your appointment if you arrive late (15 or more minutes).  Arriving late affects you and other patients whose appointments are after yours.  Also, if you miss three or more appointments without notifying the office, you may be dismissed from the clinic at the provider's discretion.      For prescription refill requests, have your pharmacy contact our office and allow 72 hours for refills to be completed.    Today your phlebotomy was held.    To help prevent nausea and vomiting after your treatment, we encourage you to take your nausea medication as directed.  BELOW ARE SYMPTOMS THAT SHOULD BE REPORTED IMMEDIATELY: *FEVER GREATER THAN 100.4 F (38 C) OR HIGHER *CHILLS OR SWEATING *NAUSEA AND VOMITING THAT IS NOT CONTROLLED WITH YOUR NAUSEA MEDICATION *UNUSUAL SHORTNESS OF BREATH *UNUSUAL BRUISING OR BLEEDING *URINARY PROBLEMS (pain or burning when urinating, or frequent urination) *BOWEL PROBLEMS (unusual diarrhea, constipation, pain near the anus) TENDERNESS IN MOUTH AND THROAT WITH OR WITHOUT PRESENCE OF ULCERS (sore throat, sores in mouth, or a toothache) UNUSUAL RASH, SWELLING OR PAIN  UNUSUAL VAGINAL DISCHARGE OR ITCHING   Items with * indicate a potential emergency and should be followed up as soon as possible or go to the Emergency Department if any problems should  occur.  Please show the CHEMOTHERAPY ALERT CARD or IMMUNOTHERAPY ALERT CARD at check-in to the Emergency Department and triage nurse.  Should you have questions after your visit or need to cancel or reschedule your appointment, please contact [facility]ENTER AT [facility] [phone]  and follow the prompts.  Office hours are 8:00 a.m. to 4:30 p.m. Monday - Friday. Please note that voicemails left after 4:00 p.m. may not be returned until the following business day.  We are closed weekends and major holidays. You have access to a nurse at all times for urgent questions. Please call the main number to the clinic [phone] and follow the prompts.  For any non-urgent questions, you may also contact your provider using MyChart. We now offer e-Visits for anyone 35 and older to request care online for non-urgent symptoms. For details visit [email or web address].   Also download the MyChart app! Go to the app store, search "MyChart", open the app, select [place], and log in with your MyChart username and password.

## 2022-09-20 NOTE — Progress Notes (Signed)
Blood pressure is 89/63 today. Rechecked 96/66. Rojelio Brenner PA-C notified . Will give fluid bolus over and hour today and then see if blood pressure come up.   Patient is refusing fluids. Patient denies any SOB, any light headedness. Asymptomatic.  Patient wants to reschedule.    Patient is not on any blood pressure medications at this time. Advised him to call his primary care provider ASAP today to inform them of his low blood pressure and see if he needs any medications adjusted. Patient was advised to go to the ED if he had any symptoms of dizziness, lightheadedness, weakness, strokelike symptoms, headache, shortness of breath, fever, or chest pain. Patient understands and will keep a check on it.

## 2022-09-27 ENCOUNTER — Inpatient Hospital Stay: Payer: 59

## 2022-09-27 NOTE — Progress Notes (Deleted)
Patient presents today for therapeutic phlebotomy.  Patient is in satisfactory condition with no new complaints voiced.  Vital signs are stable.  We will proceed with phlebotomy per provider orders.  Therapeutic phlebotomy started at *** and ended at ***.  500 mL of blood removed from ** arm.  Vital signs remained stable.  Patient denies dizziness or lightheadedness.  Coban and gauze applied to site. Patient waited the recommended 30 minute post phlebotomy wait time.  Patient left ambulatory in stable condition.

## 2022-09-27 NOTE — Progress Notes (Signed)
Howard [name] presents today for phlebotomy per MD orders. Phlebotomy procedure started at 1513 and ended at 1517. 500 grams removed. Patient observed for 10 minutes after procedure without any incident. Patient tolerated procedure well. IV needle removed intact.   Vitals stable and discharged home from clinic ambulatory. Follow up as scheduled.

## 2022-09-27 NOTE — Patient Instructions (Signed)
[facility]  Discharge Instructions: Thank you for choosing [facility] to provide your oncology and hematology care.  If you have a lab appointment with the Cancer Center - please note that after April 8th, 2024, all labs will be drawn in the cancer center.  You do not have to check in or register with the main entrance as you have in the past but will complete your check-in in the cancer center.  Wear comfortable clothing and clothing appropriate for easy access to any Portacath or PICC line.   We strive to give you quality time with your provider. You may need to reschedule your appointment if you arrive late (15 or more minutes).  Arriving late affects you and other patients whose appointments are after yours.  Also, if you miss three or more appointments without notifying the office, you may be dismissed from the clinic at the provider's discretion.      For prescription refill requests, have your pharmacy contact our office and allow 72 hours for refills to be completed.    Today you received the following, phlebotomy   To help prevent nausea and vomiting after your treatment, we encourage you to take your nausea medication as directed.  BELOW ARE SYMPTOMS THAT SHOULD BE REPORTED IMMEDIATELY: *FEVER GREATER THAN 100.4 F (38 C) OR HIGHER *CHILLS OR SWEATING *NAUSEA AND VOMITING THAT IS NOT CONTROLLED WITH YOUR NAUSEA MEDICATION *UNUSUAL SHORTNESS OF BREATH *UNUSUAL BRUISING OR BLEEDING *URINARY PROBLEMS (pain or burning when urinating, or frequent urination) *BOWEL PROBLEMS (unusual diarrhea, constipation, pain near the anus) TENDERNESS IN MOUTH AND THROAT WITH OR WITHOUT PRESENCE OF ULCERS (sore throat, sores in mouth, or a toothache) UNUSUAL RASH, SWELLING OR PAIN  UNUSUAL VAGINAL DISCHARGE OR ITCHING   Items with * indicate a potential emergency and should be followed up as soon as possible or go to the Emergency Department if any problems should  occur.  Please show the CHEMOTHERAPY ALERT CARD or IMMUNOTHERAPY ALERT CARD at check-in to the Emergency Department and triage nurse.  Should you have questions after your visit or need to cancel or reschedule your appointment, please contact [facility] AT [facility] [phone]  and follow the prompts.  Office hours are 8:00 a.m. to 4:30 p.m. Monday - Friday. Please note that voicemails left after 4:00 p.m. may not be returned until the following business day.  We are closed weekends and major holidays. You have access to a nurse at all times for urgent questions. Please call the main number to the clinic [phone] and follow the prompts.  For any non-urgent questions, you may also contact your provider using MyChart. We now offer e-Visits for anyone 51 and older to request care online for non-urgent symptoms. For details visit [email or web address].   Also download the MyChart app! Go to the app store, search "MyChart", open the app, select [place], and log in with your MyChart username and password.

## 2022-09-29 ENCOUNTER — Ambulatory Visit: Payer: 59 | Admitting: Internal Medicine

## 2022-10-04 ENCOUNTER — Inpatient Hospital Stay: Payer: 59

## 2022-10-04 LAB — CBC WITH DIFFERENTIAL/PLATELET
Abs Immature Granulocytes: 0.01 10*3/uL (ref 0.00–0.07)
Basophils Absolute: 0 10*3/uL (ref 0.0–0.1)
Basophils Relative: 1 %
Eosinophils Absolute: 0 10*3/uL (ref 0.0–0.5)
Eosinophils Relative: 0 %
HCT: 36.5 % — ABNORMAL LOW (ref 39.0–52.0)
Hemoglobin: 12.3 g/dL — ABNORMAL LOW (ref 13.0–17.0)
Immature Granulocytes: 0 %
Lymphocytes Relative: 29 %
Lymphs Abs: 2.1 10*3/uL (ref 0.7–4.0)
MCH: 32.7 pg (ref 26.0–34.0)
MCHC: 33.7 g/dL (ref 30.0–36.0)
MCV: 97.1 fL (ref 80.0–100.0)
Monocytes Absolute: 0.4 10*3/uL (ref 0.1–1.0)
Monocytes Relative: 6 %
Neutro Abs: 4.6 10*3/uL (ref 1.7–7.7)
Neutrophils Relative %: 64 %
Platelets: 171 10*3/uL (ref 150–400)
RBC: 3.76 MIL/uL — ABNORMAL LOW (ref 4.22–5.81)
RDW: 12.6 % (ref 11.5–15.5)
WBC: 7.2 10*3/uL (ref 4.0–10.5)
nRBC: 0 % (ref 0.0–0.2)

## 2022-10-04 NOTE — Progress Notes (Signed)
Patient presents today for therapeutic phlebotomy.  Patient is in satisfactory condition with no new complaints voiced.  Vital signs are stable.  We will proceed with phlebotomy per provider orders.  Therapeutic phlebotomy started at 1537 and ended at 1550.  500 mL of blood removed from L arm.  Vital signs remained stable.  Patient denies dizziness or lightheadedness.  Coban and gauze applied to site. Patient refused to wait the recommended 30 minute post phlebotomy wait time.  Patient stayed about 10 minutes before leaving.  Patient left ambulatory in stable condition.

## 2022-10-04 NOTE — Patient Instructions (Signed)
[facility] [place]  Discharge Instructions: Thank you for choosing [facility] to provide your oncology and hematology care.  If you have a lab appointment with the Cancer Center - please note that after April 8th, 2024, all labs will be drawn in the cancer center.  You do not have to check in or register with the main entrance as you have in the past but will complete your check-in in the cancer center.  Wear comfortable clothing and clothing appropriate for easy access to any Portacath or PICC line.   We strive to give you quality time with your provider. You may need to reschedule your appointment if you arrive late (15 or more minutes).  Arriving late affects you and other patients whose appointments are after yours.  Also, if you miss three or more appointments without notifying the office, you may be dismissed from the clinic at the provider's discretion.      For prescription refill requests, have your pharmacy contact our office and allow 72 hours for refills to be completed.  To help prevent nausea and vomiting after your treatment, we encourage you to take your nausea medication as directed.  BELOW ARE SYMPTOMS THAT SHOULD BE REPORTED IMMEDIATELY: *FEVER GREATER THAN 100.4 F (38 C) OR HIGHER *CHILLS OR SWEATING *NAUSEA AND VOMITING THAT IS NOT CONTROLLED WITH YOUR NAUSEA MEDICATION *UNUSUAL SHORTNESS OF BREATH *UNUSUAL BRUISING OR BLEEDING *URINARY PROBLEMS (pain or burning when urinating, or frequent urination) *BOWEL PROBLEMS (unusual diarrhea, constipation, pain near the anus) TENDERNESS IN MOUTH AND THROAT WITH OR WITHOUT PRESENCE OF ULCERS (sore throat, sores in mouth, or a toothache) UNUSUAL RASH, SWELLING OR PAIN  UNUSUAL VAGINAL DISCHARGE OR ITCHING   Items with * indicate a potential emergency and should be followed up as soon as possible or go to the Emergency Department if any problems should occur.  Please show the CHEMOTHERAPY ALERT CARD or  IMMUNOTHERAPY ALERT CARD at check-in to the Emergency Department and triage nurse.  Should you have questions after your visit or need to cancel or reschedule your appointment, please contact [facility] [place] [phone]  and follow the prompts.  Office hours are 8:00 a.m. to 4:30 p.m. Monday - Friday. Please note that voicemails left after 4:00 p.m. may not be returned until the following business day.  We are closed weekends and major holidays. You have access to a nurse at all times for urgent questions. Please call the main number to the clinic [phone] and follow the prompts.  For any non-urgent questions, you may also contact your provider using MyChart. We now offer e-Visits for anyone 18 and older to request care online for non-urgent symptoms. For details visit [email or web address].   Also download the MyChart app! Go to the app store, search "MyChart", open the app, select [place], and log in with your MyChart username and password.   

## 2022-10-12 ENCOUNTER — Inpatient Hospital Stay: Payer: 59

## 2022-10-12 NOTE — Progress Notes (Signed)
Howard [name] presents for therapeutic phlebotomy per MD orders. Last HGB 12.3 / HCT 36.5 on 10-04-2022 . Vital signs stable prior to procedure. Procedure started at 15:05 pm and ended at 15:13pm. 500 mls of blood removed. Patient denies any dizziness , lightheadedness, or feeling faint.  Gauze and coban applied to site. Vital signs stable at completion of procedure. Patient has no complaints at this time. Alert and oriented x 3. Discharged in stable condition.  Patient declined 30 minute monitoring post phlebotomy. Patient instructed and teaching performed pertaining to policy. Understanding verbalized.

## 2022-10-12 NOTE — Patient Instructions (Signed)
[facility]  Discharge Instructions: Thank you for choosing [facility] to provide your oncology and hematology care.  If you have a lab appointment with the Cancer Center - please note that after April 8th, 2024, all labs will be drawn in the cancer center.  You do not have to check in or register with the main entrance as you have in the past but will complete your check-in in the cancer center.  Wear comfortable clothing and clothing appropriate for easy access to any Portacath or PICC line.   We strive to give you quality time with your provider. You may need to reschedule your appointment if you arrive late (15 or more minutes).  Arriving late affects you and other patients whose appointments are after yours.  Also, if you miss three or more appointments without notifying the office, you may be dismissed from the clinic at the provider's discretion.      For prescription refill requests, have your pharmacy contact our office and allow 72 hours for refills to be completed.    Today you received the following chemotherapy and/or immunotherapy agents phlebotomy. Therapeutic Phlebotomy, Care After The following information offers guidance on how to care for yourself after your procedure. Your health care provider may also give you more specific instructions. If you have problems or questions, contact your health care provider. What can I expect after the procedure? After therapeutic phlebotomy, it is common to have: Light-headedness or dizziness. You may feel faint. Nausea. Tiredness (fatigue). Follow these instructions at home: Eating and drinking Be sure to eat well-balanced meals for the next 24 hours. Drink enough fluid to keep your urine pale yellow. Avoid drinking alcohol on the day that you had the procedure. Activity  Return to your normal activities as told by your health care provider. Most people can go back to their normal activities right  away. Avoid activities that take a lot of effort for about 5 hours after the procedure. Athletes should avoid strenuous exercise for at least 12 hours. Avoid heavy lifting or pulling for about 5 hours after the procedure. Do not lift anything that is heavier than 10 lb (4.5 kg). Change positions slowly for the remainder of the day, like from sitting to standing. This can help prevent light-headedness or fainting. If you feel light-headed, lie down until the feeling goes away. Needle insertion site care  Keep your bandage (dressing) dry. You can remove the bandage after about 5 hours or as told by your health care provider. If you have bleeding from the needle insertion site, raise (elevate) your arm and press firmly on the site until the bleeding stops. If you have bruising at the site, apply ice to the area. To do this: Put ice in a plastic bag. Place a towel between your skin and the bag. Leave the ice on for 20 minutes, 2-3 times a day for the first 24 hours. Remove the ice if your skin turns bright red so you do not damage the area. If the swelling does not go away after 24 hours, apply a warm, moist cloth (warm compress) to the area for 20 minutes, 2-3 times a day. General instructions Do not use any products that contain nicotine or tobacco, like cigarettes, chewing tobacco, and vaping devices, such as e-cigarettes, for at least 30 minutes after the procedure. If you need help quitting, ask your health care provider. Keep all follow-up visits. You may need to continue having regular blood tests  and therapeutic phlebotomy treatments as directed. Contact a health care provider if: You have redness, swelling, or pain at the needle insertion site. Fluid or blood is coming from the needle insertion site. Pus or a bad smell is coming from the needle insertion site. The needle insertion site feels warm to the touch. You feel light-headed, dizzy, or nauseous, and the feeling does not go  away. You have new bruising at the needle insertion site. You feel weaker than normal. You have a fever or chills. Get help right away if: You have chest pain. You have trouble breathing. You have severe nausea or vomiting. Summary After the procedure, it is common to have some light-headedness, dizziness, nausea, or tiredness (fatigue). Be sure to eat well-balanced meals for the next 24 hours. Drink enough fluid to keep your urine pale yellow. Return to your normal activities as told by your health care provider. Keep all follow-up visits. You may need to continue having regular blood tests and therapeutic phlebotomy treatments as directed. This information is not intended to replace advice given to you by your health care provider. Make sure you discuss any questions you have with your health care provider. Document Revised: 10/29/2020 Document Reviewed: 10/29/2020 Elsevier Patient Education  2024 Elsevier Inc.       To help prevent nausea and vomiting after your treatment, we encourage you to take your nausea medication as directed.  BELOW ARE SYMPTOMS THAT SHOULD BE REPORTED IMMEDIATELY: *FEVER GREATER THAN 100.4 F (38 C) OR HIGHER *CHILLS OR SWEATING *NAUSEA AND VOMITING THAT IS NOT CONTROLLED WITH YOUR NAUSEA MEDICATION *UNUSUAL SHORTNESS OF BREATH *UNUSUAL BRUISING OR BLEEDING *URINARY PROBLEMS (pain or burning when urinating, or frequent urination) *BOWEL PROBLEMS (unusual diarrhea, constipation, pain near the anus) TENDERNESS IN MOUTH AND THROAT WITH OR WITHOUT PRESENCE OF ULCERS (sore throat, sores in mouth, or a toothache) UNUSUAL RASH, SWELLING OR PAIN  UNUSUAL VAGINAL DISCHARGE OR ITCHING   Items with * indicate a potential emergency and should be followed up as soon as possible or go to the Emergency Department if any problems should occur.  Please show the CHEMOTHERAPY ALERT CARD or IMMUNOTHERAPY ALERT CARD at check-in to the Emergency Department and triage  nurse.  Should you have questions after your visit or need to cancel or reschedule your appointment, please contact [facility] CENTER AT [facility] [phone]  and follow the prompts.  Office hours are 8:00 a.m. to 4:30 p.m. Monday - Friday. Please note that voicemails left after 4:00 p.m. may not be returned until the following business day.  We are closed weekends and major holidays. You have access to a nurse at all times for urgent questions. Please call the main number to the clinic [phone] and follow the prompts.  For any non-urgent questions, you may also contact your provider using MyChart. We now offer e-Visits for anyone 51 and older to request care online for non-urgent symptoms. For details visit [email or web address].   Also download the MyChart app! Go to the app store, search "MyChart", open the app, select [place], and log in with your MyChart username and password.

## 2022-10-19 ENCOUNTER — Inpatient Hospital Stay: Payer: 59 | Attending: Hematology

## 2022-10-19 ENCOUNTER — Inpatient Hospital Stay: Payer: 59

## 2022-10-19 LAB — CBC WITH DIFFERENTIAL/PLATELET
Abs Immature Granulocytes: 0.01 10*3/uL (ref 0.00–0.07)
Basophils Absolute: 0.1 10*3/uL (ref 0.0–0.1)
Basophils Relative: 1 %
Eosinophils Absolute: 0.1 10*3/uL (ref 0.0–0.5)
Eosinophils Relative: 2 %
HCT: 33.9 % — ABNORMAL LOW (ref 39.0–52.0)
Hemoglobin: 11.5 g/dL — ABNORMAL LOW (ref 13.0–17.0)
Immature Granulocytes: 0 %
Lymphocytes Relative: 48 %
Lymphs Abs: 3.3 10*3/uL (ref 0.7–4.0)
MCH: 33.7 pg (ref 26.0–34.0)
MCHC: 33.9 g/dL (ref 30.0–36.0)
MCV: 99.4 fL (ref 80.0–100.0)
Monocytes Absolute: 0.5 10*3/uL (ref 0.1–1.0)
Monocytes Relative: 7 %
Neutro Abs: 2.9 10*3/uL (ref 1.7–7.7)
Neutrophils Relative %: 42 %
Platelets: 183 10*3/uL (ref 150–400)
RBC: 3.41 MIL/uL — ABNORMAL LOW (ref 4.22–5.81)
RDW: 13.5 % (ref 11.5–15.5)
WBC: 6.9 10*3/uL (ref 4.0–10.5)
nRBC: 0 % (ref 0.0–0.2)

## 2022-10-19 NOTE — Progress Notes (Signed)
Reviewed labs with provider with verbal order no phlebotomy today per Rojelio Brenner, PA.  Reviewed with the patient with no complaints voiced.

## 2022-10-26 ENCOUNTER — Inpatient Hospital Stay: Payer: 59

## 2022-10-28 ENCOUNTER — Encounter: Payer: Self-pay | Admitting: Internal Medicine

## 2022-11-02 ENCOUNTER — Inpatient Hospital Stay: Payer: 59

## 2022-11-02 LAB — CBC WITH DIFFERENTIAL/PLATELET
Abs Immature Granulocytes: 0.02 10*3/uL (ref 0.00–0.07)
Basophils Absolute: 0.1 10*3/uL (ref 0.0–0.1)
Basophils Relative: 1 %
Eosinophils Absolute: 0 10*3/uL (ref 0.0–0.5)
Eosinophils Relative: 0 %
HCT: 37.1 % — ABNORMAL LOW (ref 39.0–52.0)
Hemoglobin: 12.5 g/dL — ABNORMAL LOW (ref 13.0–17.0)
Immature Granulocytes: 0 %
Lymphocytes Relative: 27 %
Lymphs Abs: 2.4 10*3/uL (ref 0.7–4.0)
MCH: 33.6 pg (ref 26.0–34.0)
MCHC: 33.7 g/dL (ref 30.0–36.0)
MCV: 99.7 fL (ref 80.0–100.0)
Monocytes Absolute: 0.6 10*3/uL (ref 0.1–1.0)
Monocytes Relative: 6 %
Neutro Abs: 6 10*3/uL (ref 1.7–7.7)
Neutrophils Relative %: 66 %
Platelets: 205 10*3/uL (ref 150–400)
RBC: 3.72 MIL/uL — ABNORMAL LOW (ref 4.22–5.81)
RDW: 14.2 % (ref 11.5–15.5)
WBC: 9 10*3/uL (ref 4.0–10.5)
nRBC: 0 % (ref 0.0–0.2)

## 2022-11-02 NOTE — Patient Instructions (Signed)
[facility] [place]  Discharge Instructions: Thank you for choosing [facility] to provide your oncology and hematology care.  If you have a lab appointment with the Cancer Center - please note that after April 8th, 2024, all labs will be drawn in the cancer center.  You do not have to check in or register with the main entrance as you have in the past but will complete your check-in in the cancer center.  Wear comfortable clothing and clothing appropriate for easy access to any Portacath or PICC line.   We strive to give you quality time with your provider. You may need to reschedule your appointment if you arrive late (15 or more minutes).  Arriving late affects you and other patients whose appointments are after yours.  Also, if you miss three or more appointments without notifying the office, you may be dismissed from the clinic at the provider's discretion.      For prescription refill requests, have your pharmacy contact our office and allow 72 hours for refills to be completed.    Today you received therapeutic phlebotomy      BELOW ARE SYMPTOMS THAT SHOULD BE REPORTED IMMEDIATELY: *FEVER GREATER THAN 100.4 F (38 C) OR HIGHER *CHILLS OR SWEATING *NAUSEA AND VOMITING THAT IS NOT CONTROLLED WITH YOUR NAUSEA MEDICATION *UNUSUAL SHORTNESS OF BREATH *UNUSUAL BRUISING OR BLEEDING *URINARY PROBLEMS (pain or burning when urinating, or frequent urination) *BOWEL PROBLEMS (unusual diarrhea, constipation, pain near the anus) TENDERNESS IN MOUTH AND THROAT WITH OR WITHOUT PRESENCE OF ULCERS (sore throat, sores in mouth, or a toothache) UNUSUAL RASH, SWELLING OR PAIN  UNUSUAL VAGINAL DISCHARGE OR ITCHING   Items with * indicate a potential emergency and should be followed up as soon as possible or go to the Emergency Department if any problems should occur.  Please show the CHEMOTHERAPY ALERT CARD or IMMUNOTHERAPY ALERT CARD at check-in to the Emergency Department  and triage nurse.  Should you have questions after your visit or need to cancel or reschedule your appointment, please contact [facility] [place] [phone]  and follow the prompts.  Office hours are 8:00 a.m. to 4:30 p.m. Monday - Friday. Please note that voicemails left after 4:00 p.m. may not be returned until the following business day.  We are closed weekends and major holidays. You have access to a nurse at all times for urgent questions. Please call the main number to the clinic [phone] and follow the prompts.  For any non-urgent questions, you may also contact your provider using MyChart. We now offer e-Visits for anyone 18 and older to request care online for non-urgent symptoms. For details visit [email or web address].   Also download the MyChart app! Go to the app store, search "MyChart", open the app, select [place], and log in with your MyChart username and password.   

## 2022-11-02 NOTE — Progress Notes (Signed)
Patient presents today for therapeutic phlebotomy.  Patient is in satisfactory condition with no new complaints voiced.  Vital signs are stable. Pt's hemoglobin is 12.5 and hemocrit  37.1 drawn 11/02/22. We will proceed with phlebotomy per provider orders.  Therapeutic phlebotomy started at 1450 and ended at 1503. 500 grams of blood removed from right arm. Vital signs remained stable.  Patient denies dizziness or lightheadedness.  Coban and gauze applied to site. Patient waited the 20 minute post phlebotomy wait time.  Patient left ambulatory in stable condition.

## 2022-11-09 ENCOUNTER — Inpatient Hospital Stay: Payer: 59

## 2022-11-09 NOTE — Patient Instructions (Signed)
[facility]  Discharge Instructions: Thank you for choosing [facility] to provide your oncology and hematology care.  If you have a lab appointment with the Cancer Center - please note that after April 8th, 2024, all labs will be drawn in the cancer center.  You do not have to check in or register with the main entrance as you have in the past but will complete your check-in in the cancer center.  Wear comfortable clothing and clothing appropriate for easy access to any Portacath or PICC line.   We strive to give you quality time with your provider. You may need to reschedule your appointment if you arrive late (15 or more minutes).  Arriving late affects you and other patients whose appointments are after yours.  Also, if you miss three or more appointments without notifying the office, you may be dismissed from the clinic at the provider's discretion.      For prescription refill requests, have your pharmacy contact our office and allow 72 hours for refills to be completed.  To help prevent nausea and vomiting after your treatment, we encourage you to take your nausea medication as directed.  BELOW ARE SYMPTOMS THAT SHOULD BE REPORTED IMMEDIATELY: *FEVER GREATER THAN 100.4 F (38 C) OR HIGHER *CHILLS OR SWEATING *NAUSEA AND VOMITING THAT IS NOT CONTROLLED WITH YOUR NAUSEA MEDICATION *UNUSUAL SHORTNESS OF BREATH *UNUSUAL BRUISING OR BLEEDING *URINARY PROBLEMS (pain or burning when urinating, or frequent urination) *BOWEL PROBLEMS (unusual diarrhea, constipation, pain near the anus) TENDERNESS IN MOUTH AND THROAT WITH OR WITHOUT PRESENCE OF ULCERS (sore throat, sores in mouth, or a toothache) UNUSUAL RASH, SWELLING OR PAIN  UNUSUAL VAGINAL DISCHARGE OR ITCHING   Items with * indicate a potential emergency and should be followed up as soon as possible or go to the Emergency Department if any problems should occur.  Please show the CHEMOTHERAPY ALERT CARD or  IMMUNOTHERAPY ALERT CARD at check-in to the Emergency Department and triage nurse.  Should you have questions after your visit or need to cancel or reschedule your appointment, please contact [facility] [phone]  and follow the prompts.  Office hours are 8:00 a.m. to 4:30 p.m. Monday - Friday. Please note that voicemails left after 4:00 p.m. may not be returned until the following business day.  We are closed weekends and major holidays. You have access to a nurse at all times for urgent questions. Please call the main number to the clinic [phone] and follow the prompts.  For any non-urgent questions, you may also contact your provider using MyChart. We now offer e-Visits for anyone 18 and older to request care online for non-urgent symptoms. For details visit [email or web address].   Also download the MyChart app! Go to the app store, search "MyChart", open the app, select [facility], and log in with your MyChart username and password.   

## 2022-11-09 NOTE — Progress Notes (Signed)
Patient presents today for therapeutic phlebotomy.  Patient is in satisfactory condition with no new complaints voiced.  Vital signs are stable.  We will proceed with phlebotomy per provider orders.  Therapeutic phlebotomy started at 1513 and ended at 1521.  500 mL of blood removed from right arm.  Vital signs remained stable.  Patient denies dizziness or lightheadedness.  Coban and gauze applied to site. Patient refused to wait the recommended 30 minute post phlebotomy wait time.  Patient waited approximately 15 minutes post phlebotomy.  Patient left ambulatory in stable condition.

## 2022-11-16 ENCOUNTER — Inpatient Hospital Stay: Payer: 59

## 2022-11-16 ENCOUNTER — Inpatient Hospital Stay: Payer: 59 | Attending: Hematology

## 2022-11-16 DIAGNOSIS — Z79899 Other long term (current) drug therapy: Secondary | ICD-10-CM | POA: Diagnosis not present

## 2022-11-16 LAB — CBC WITH DIFFERENTIAL/PLATELET
Abs Immature Granulocytes: 0.02 10*3/uL (ref 0.00–0.07)
Basophils Absolute: 0 10*3/uL (ref 0.0–0.1)
Basophils Relative: 1 %
Eosinophils Absolute: 0 10*3/uL (ref 0.0–0.5)
Eosinophils Relative: 0 %
HCT: 36.2 % — ABNORMAL LOW (ref 39.0–52.0)
Hemoglobin: 12.3 g/dL — ABNORMAL LOW (ref 13.0–17.0)
Immature Granulocytes: 0 %
Lymphocytes Relative: 17 %
Lymphs Abs: 1.5 10*3/uL (ref 0.7–4.0)
MCH: 34.4 pg — ABNORMAL HIGH (ref 26.0–34.0)
MCHC: 34 g/dL (ref 30.0–36.0)
MCV: 101.1 fL — ABNORMAL HIGH (ref 80.0–100.0)
Monocytes Absolute: 0.4 10*3/uL (ref 0.1–1.0)
Monocytes Relative: 4 %
Neutro Abs: 6.7 10*3/uL (ref 1.7–7.7)
Neutrophils Relative %: 78 %
Platelets: 182 10*3/uL (ref 150–400)
RBC: 3.58 MIL/uL — ABNORMAL LOW (ref 4.22–5.81)
RDW: 14.8 % (ref 11.5–15.5)
WBC: 8.6 10*3/uL (ref 4.0–10.5)
nRBC: 0 % (ref 0.0–0.2)

## 2022-11-16 NOTE — Progress Notes (Signed)
Patient presents today for therapeutic phlebotomy.  Patient is in satisfactory condition with no new complaints voiced.  Vital signs are stable.  Hemoglobin today is 12.3 and hematocrit is 36.2.  Rojelio Brenner, PA-C aware.  We will proceed with phlebotomy per provider orders.  Therapeutic phlebotomy started at 1502 and ended at 1505.  500 mL of blood removed from R arm.  Vital signs remained stable.  Patient denies dizziness or lightheadedness.  Coban and gauze applied to site. Patient refused to wait the  recommended 30 minute post phlebotomy wait time.  Patient left ambulatory in stable condition.

## 2022-11-16 NOTE — Patient Instructions (Signed)
[facility]  Discharge Instructions: Thank you for choosing [facility] to provide your oncology and hematology care.  If you have a lab appointment with the Cancer Center - please note that after April 8th, 2024, all labs will be drawn in the cancer center.  You do not have to check in or register with the main entrance as you have in the past but will complete your check-in in the cancer center.  Wear comfortable clothing and clothing appropriate for easy access to any Portacath or PICC line.   We strive to give you quality time with your provider. You may need to reschedule your appointment if you arrive late (15 or more minutes).  Arriving late affects you and other patients whose appointments are after yours.  Also, if you miss three or more appointments without notifying the office, you may be dismissed from the clinic at the provider's discretion.      For prescription refill requests, have your pharmacy contact our office and allow 72 hours for refills to be completed.  To help prevent nausea and vomiting after your treatment, we encourage you to take your nausea medication as directed.  BELOW ARE SYMPTOMS THAT SHOULD BE REPORTED IMMEDIATELY: *FEVER GREATER THAN 100.4 F (38 C) OR HIGHER *CHILLS OR SWEATING *NAUSEA AND VOMITING THAT IS NOT CONTROLLED WITH YOUR NAUSEA MEDICATION *UNUSUAL SHORTNESS OF BREATH *UNUSUAL BRUISING OR BLEEDING *URINARY PROBLEMS (pain or burning when urinating, or frequent urination) *BOWEL PROBLEMS (unusual diarrhea, constipation, pain near the anus) TENDERNESS IN MOUTH AND THROAT WITH OR WITHOUT PRESENCE OF ULCERS (sore throat, sores in mouth, or a toothache) UNUSUAL RASH, SWELLING OR PAIN  UNUSUAL VAGINAL DISCHARGE OR ITCHING   Items with * indicate a potential emergency and should be followed up as soon as possible or go to the Emergency Department if any problems should occur.  Please show the CHEMOTHERAPY ALERT CARD or  IMMUNOTHERAPY ALERT CARD at check-in to the Emergency Department and triage nurse.  Should you have questions after your visit or need to cancel or reschedule your appointment, please contact [facility]  [phone]  and follow the prompts.  Office hours are 8:00 a.m. to 4:30 p.m. Monday - Friday. Please note that voicemails left after 4:00 p.m. may not be returned until the following business day.  We are closed weekends and major holidays. You have access to a nurse at all times for urgent questions. Please call the main number to the clinic [phone] and follow the prompts.  For any non-urgent questions, you may also contact your provider using MyChart. We now offer e-Visits for anyone 18 and older to request care online for non-urgent symptoms. For details visit [email or web address].   Also download the MyChart app! Go to the app store, search "MyChart", open the app, select [facility], and log in with your MyChart username and password.   

## 2022-11-23 ENCOUNTER — Inpatient Hospital Stay: Payer: 59

## 2022-11-23 NOTE — Progress Notes (Signed)
Howard [name] presents today for phlebotomy per MD orders. Phlebotomy procedure started at 1515 and ended at 1520. 505 grams removed. Patient observed for 15 minutes after procedure without any incident. Patient tolerated procedure well. 16G Phlebotomy kit used to R arm. IV needle removed intact. Food and beverage offered, patient declined.  Discharged in stable condition, ambulatory to lobby.

## 2022-11-29 ENCOUNTER — Other Ambulatory Visit: Payer: Self-pay

## 2022-11-30 ENCOUNTER — Inpatient Hospital Stay: Payer: 59

## 2022-12-07 ENCOUNTER — Inpatient Hospital Stay: Payer: 59

## 2022-12-07 LAB — CBC WITH DIFFERENTIAL/PLATELET
Abs Immature Granulocytes: 0.01 10*3/uL (ref 0.00–0.07)
Basophils Absolute: 0 10*3/uL (ref 0.0–0.1)
Basophils Relative: 1 %
Eosinophils Absolute: 0 10*3/uL (ref 0.0–0.5)
Eosinophils Relative: 0 %
HCT: 36.3 % — ABNORMAL LOW (ref 39.0–52.0)
Hemoglobin: 12.3 g/dL — ABNORMAL LOW (ref 13.0–17.0)
Immature Granulocytes: 0 %
Lymphocytes Relative: 41 %
Lymphs Abs: 2.6 10*3/uL (ref 0.7–4.0)
MCH: 34.3 pg — ABNORMAL HIGH (ref 26.0–34.0)
MCHC: 33.9 g/dL (ref 30.0–36.0)
MCV: 101.1 fL — ABNORMAL HIGH (ref 80.0–100.0)
Monocytes Absolute: 0.4 10*3/uL (ref 0.1–1.0)
Monocytes Relative: 6 %
Neutro Abs: 3.3 10*3/uL (ref 1.7–7.7)
Neutrophils Relative %: 52 %
Platelets: 187 10*3/uL (ref 150–400)
RBC: 3.59 MIL/uL — ABNORMAL LOW (ref 4.22–5.81)
RDW: 13.8 % (ref 11.5–15.5)
WBC: 6.3 10*3/uL (ref 4.0–10.5)
nRBC: 0 % (ref 0.0–0.2)

## 2022-12-07 LAB — IRON AND TIBC
Iron: 158 ug/dL (ref 45–182)
Saturation Ratios: 84 % — ABNORMAL HIGH (ref 17.9–39.5)
TIBC: 187 ug/dL — ABNORMAL LOW (ref 250–450)
UIBC: 29 ug/dL

## 2022-12-07 LAB — RETICULOCYTES
Immature Retic Fract: 4.9 % (ref 2.3–15.9)
RBC.: 3.61 MIL/uL — ABNORMAL LOW (ref 4.22–5.81)
Retic Count, Absolute: 25.3 10*3/uL (ref 19.0–186.0)
Retic Ct Pct: 0.7 % (ref 0.4–3.1)

## 2022-12-07 LAB — FERRITIN: Ferritin: 885 ng/mL — ABNORMAL HIGH (ref 24–336)

## 2022-12-07 NOTE — Progress Notes (Unsigned)
[facility] [address], [place] [address]   CLINIC:  Medical Oncology/Hematology  PCP:  Elfredia Nevins, MD [address] [place] [address] [phone]   REASON FOR VISIT:  Follow-up for hereditary hemochromatosis (C282Y homozygous)  CURRENT THERAPY: Therapeutic phlebotomy  INTERVAL HISTORY:   [name] 57 y.o. male returns for routine follow-up of hereditary hemochromatosis.  He was last seen on 09/07/2022 by Rojelio Brenner PA-C  At today's visit, he reports feeling fairly well.  He has been receiving weekly phlebotomy since January 2024.  He has been tolerating phlebotomy well, and is not feeling as tired or having headaches as often the day after phlebotomy.  He has ongoing back pain related to his scoliosis, but his other joint pain has improved.  He also notes improved mental clarity since starting phlebotomy.  He continues to have mild nausea in the morning.  He has some right lower quadrant abdominal pain, but is not sure if this is actually originating in his back or not.  (He is scheduled to see GI next week).  He denies any evidence of active bleeding.  He has 100% energy and 100% appetite. He endorses that he is maintaining a stable weight.  ASSESSMENT & PLAN:  1.  Hereditary hemochromatosis, C282Y homozygous - Seen at the request of Dr. Marletta Lor (gastroenterology) for further evaluation and management of hemochromatosis, which was discovered during workup of transaminitis. Iron panel (04/07/2022) the with ferritin 2828, iron saturation 95%. Hemochromatosis DNA testing revealed C282Y homozygosity (04/19/2022) MRI abdomen (05/24/2022) consistent with severe diffuse hepatic iron deposition disease, without evidence of iron deposition within the spleen, pancreas, or bone marrow. - No family history of hemochromatosis. - Symptomatic iron overload with fatigue and arthralgia.  Symptoms have improved after starting phlebotomy.   - Weekly  phlebotomy started in January 2024.  Tolerating phlebotomy fairly well. - Most recent labs (12/07/2022): Hgb 12.3/MCV 101.1, ferritin 885, iron saturation 84%, reticulocytes somewhat low for degree of anemia at 0.7% - Have discussed with patient the natural course of untreated hereditary hemochromatosis with risk of cirrhosis, hepatocellular carcinoma, heart failure, and arrhythmia.  Iron overload also increases risk of certain bacterial infections. - We have discussed lifestyle modifications such as avoiding alcohol and iron supplementation.  Recent guidelines do not find that moderate red meat intake adversely affects patients with hemochromatosis.  Patient should avoid raw fish or undercooked meat due to risk of certain bacterial infections to thrive and iron rich environments. - We have discussed phlebotomy protocol for treatment of hemochromatosis: Weekly phlebotomy as tolerated until goal ferritin is reached. If Hgb drops < 11.0, we will decrease frequency of phlebotomy. If patient experiences severe side effects or hemodynamic instability, we will decrease amount taken for phlebotomy. Goal is ferritin < 50 for normalization of iron stores. Once iron stores have normalized, we will decrease frequency of phlebotomy. PLAN: Continue weekly CBC + phlebotomy (as long as Hgb >11.0)  - Labs and RTC in 3 months   2.  Mild macrocytic anemia - Noted to have mild normocytic anemia (prior to starting phlebotomy), with Hgb 12.5/MCV 97.7 on 06/07/2022 - Macrocytosis was not present until after patient started receiving regular phlebotomies, suspected to represent compensatory reticulocytosis - Anemia workup (07/15/2022): Normal B12 at 548, normal MMA.  Normal folate. CMP shows normal kidney function. SPEP, immunofixation, light chains negative for any monoclonal gammopathy - Currently receiving weekly phlebotomy for hereditary hemochromatosis, denies any other sources of blood loss.   - PLAN: We  will  continue to monitor and consider additional testing if any severe worsening of anemia out of proportion to regular phlebotomies.  3.   Tobacco use - Current active smoker, 1 PPD since age 75 - Receives regular CT scans via cardiothoracic/cardiology for monitoring of bicuspid aorta and thoracic aortic aneurysm - CTA chest (12/24/2021) negative for any signs of pulmonary malignancy - PLAN: We discussed importance of smoking cessation and will continue to follow-up on this at future visits. - Patient qualifies for LDCT scan, but this is unnecessary as he is currently receiving regular cardiac/chest imaging as above.  4.  Other history - PMH: Psoriatic arthritis (on chronic methotrexate), fatty liver disease, history of alcoholism, GERD, chronic pain, bicuspid aorta with thoracic aortic aneurysm, anxiety/depression, osteoarthritis.  Last colonoscopy was in 2021, with recommendations for repeat colonoscopy in 10 years.  - SOCIAL: Lives at home with his wife. He is currently on disability due to his psoriatic arthritis, but previously worked in [employer]. He has a history of alcoholism (drank heavily from age 76 to age 29), but has been sober since 2019. He also reports history of illicit drug use (cocaine, marijuana, opioid pills, fentanyl), but has been clean for the past 4 years. He is a current active smoker, 1 PPD since age 80.  - FAMILY: No family history of hemochromatosis, blood problems, cancer  PLAN SUMMARY: >> Weekly CBC + phlebotomy  >> Labs in 3 months = CBC/D, ferritin, iron/TIBC  >> OFFICE visit in 3 months     REVIEW OF SYSTEMS:   Review of Systems  Constitutional:  Negative for appetite change, chills, diaphoresis, fatigue, fever and unexpected weight change.  HENT:   Negative for lump/mass and nosebleeds.   Eyes:  Negative for eye problems.  Respiratory:  Negative for cough, hemoptysis and shortness of breath.   Cardiovascular:  Negative for chest pain, leg swelling and palpitations.   Gastrointestinal:  Positive for abdominal pain. Negative for blood in stool, constipation, diarrhea, nausea and vomiting.  Genitourinary:  Negative for hematuria.   Musculoskeletal:  Positive for arthralgias and back pain.  Skin: Negative.   Neurological:  Negative for dizziness, headaches, light-headedness and numbness.  Hematological:  Does not bruise/bleed easily.     PHYSICAL EXAM:  ECOG PERFORMANCE STATUS: 1 - Symptomatic but completely ambulatory  Vitals:   12/08/22 1421  BP: 114/70  Pulse: 79  Resp: 18  Temp: 97.8 F (36.6 C)  SpO2: 99%   Filed Weights   12/08/22 1421  Weight: 161 lb (73 kg)   Physical Exam Constitutional:      Appearance: Normal appearance. He is normal weight.  Cardiovascular:     Heart sounds: Normal heart sounds.  Pulmonary:     Breath sounds: Normal breath sounds.  Musculoskeletal:     Thoracic back: Scoliosis present.  Neurological:     General: No focal deficit present.     Mental Status: Mental status is at baseline.  Psychiatric:        Behavior: Behavior normal. Behavior is cooperative.    PAST MEDICAL/SURGICAL HISTORY:  Past Medical History:  Diagnosis Date   Alcohol abuse    Anxiety    Chronic back pain    Chronic pain    Depression    Fall    Fracture of right elbow    Hereditary hemochromatosis (HCC) 06/01/2022   Osteoarthritis    Psoriasis    Past Surgical History:  Procedure Laterality Date   APPENDECTOMY     COLONOSCOPY WITH PROPOFOL  N/A 06/07/2019   diverticulosis, otherwise normal. Non-bleeding internal hemorrhoids.    FOOT SURGERY     per patient: "neurofibroma excision"   LUMBAR LAMINECTOMY/DECOMPRESSION MICRODISCECTOMY Bilateral 12/31/2015   Procedure: LUMBER DECOMPRESSION L4-5/ L5-S1 BILATERALLY  ;  Surgeon: Jene Every, MD;  Location: [facility];  Service: Orthopedics;  Laterality: Bilateral;   REVERSE SHOULDER ARTHROPLASTY Right 10/24/2018   REVERSE SHOULDER ARTHROPLASTY Right 10/24/2018   Procedure:  REVERSE SHOULDER ARTHROPLASTY;  Surgeon: Yolonda Kida, MD;  Location: [facility] OR;  Service: Orthopedics;  Laterality: Right;   SHOULDER SURGERY Right    TOTAL HIP ARTHROPLASTY Right    TOTAL HIP ARTHROPLASTY Left 08/27/2014   Procedure: LEFT TOTAL HIP ARTHROPLASTY ANTERIOR APPROACH;  Surgeon: Durene Romans, MD;  Location: [facility];  Service: Orthopedics;  Laterality: Left;   XI ROBOTIC ASSISTED INGUINAL HERNIA REPAIR WITH MESH Right 05/12/2022   Procedure: XI ROBOTIC ASSISTED INGUINAL HERNIA REPAIR WITH MESH, RIGHT;  Surgeon: Franky Macho, MD;  Location: [facility];  Service: General;  Laterality: Right;    SOCIAL HISTORY:  Social History   Socioeconomic History   Marital status: Married    Spouse name: Not on file   Number of children: Not on file   Years of education: Not on file   Highest education level: Not on file  Occupational History   Not on file  Tobacco Use   Smoking status: Every Day    Current packs/day: 1.00    Average packs/day: 1 pack/day for 32.0 years (32.0 ttl pk-yrs)    Types: Cigarettes    Passive exposure: Current   Smokeless tobacco: Never  Vaping Use   Vaping status: Never Used  Substance and Sexual Activity   Alcohol use: Not Currently    Alcohol/week: 16.0 standard drinks of alcohol    Types: 6 Glasses of wine, 10 Cans of beer per week    Comment: None currently (last ETOH 03/01/2018); previously drinks 3 to 4 beers daily and 1 glass of wine; "way more" on the weekend   Drug use: Yes    Types: Marijuana    Comment: hx of marijuana use years ago    Sexual activity: Not Currently  Other Topics Concern   Not on file  Social History Narrative   Not on file   Social Determinants of Health   Financial Resource Strain: Not on file  Food Insecurity: No Food Insecurity (06/01/2022)   Hunger Vital Sign    Worried About Running Out of Food in the Last Year: Never true    Ran Out of Food in the Last Year: Never true  Transportation Needs: No Transportation  Needs (06/01/2022)   PRAPARE - Administrator, Civil Service (Medical): No    Lack of Transportation (Non-Medical): No  Physical Activity: Not on file  Stress: Not on file  Social Connections: Not on file  Intimate Partner Violence: Not At Risk (06/01/2022)   Humiliation, Afraid, Rape, and Kick questionnaire    Fear of Current or Ex-Partner: No    Emotionally Abused: No    Physically Abused: No    Sexually Abused: No    FAMILY HISTORY:  Family History  Problem Relation Age of Onset   Heart attack Mother    Colon cancer Neg Hx     CURRENT MEDICATIONS:  Outpatient Encounter Medications as of 12/08/2022  Medication Sig   ALPRAZolam (XANAX) 0.5 MG tablet Take 0.5 mg by mouth 2 (two) times daily as needed for anxiety.   Buprenorphine HCl-Naloxone HCl  8-2 MG FILM Place under the tongue.   DULoxetine (CYMBALTA) 30 MG capsule Take 30 mg by mouth daily.   DULoxetine (CYMBALTA) 60 MG capsule Take 60 mg by mouth daily.   folic acid (FOLVITE) 1 MG tablet Take 1 mg by mouth daily.   Golimumab (SIMPONI ARIA IV) Inject 1 Dose into the vein every 8 (eight) weeks.   ibuprofen (ADVIL) 800 MG tablet Take 800 mg by mouth every 8 (eight) hours as needed for moderate pain.   levothyroxine (SYNTHROID, LEVOTHROID) 25 MCG tablet Take 25 mcg by mouth daily before breakfast.    Methotrexate Sodium (METHOTREXATE, PF,) 50 MG/2ML injection Inject 0.7 mLs into the muscle once a week.   omeprazole (PRILOSEC) 20 MG capsule Take 20 mg by mouth daily as needed (acid reflux).   predniSONE (DELTASONE) 5 MG tablet Take 5-10 mg by mouth daily as needed (arthritis).   rosuvastatin (CRESTOR) 5 MG tablet Take 1 tablet (5 mg total) by mouth daily.   tamsulosin (FLOMAX) 0.4 MG CAPS capsule Take 0.4 mg by mouth daily.   No facility-administered encounter medications on file as of 12/08/2022.    ALLERGIES:  No Known Allergies  LABORATORY DATA:  I have reviewed the labs as listed.  CBC    Component Value  Date/Time   WBC 6.3 12/07/2022 1410   RBC 3.61 (L) 12/07/2022 1410   RBC 3.59 (L) 12/07/2022 1410   HGB 12.3 (L) 12/07/2022 1410   HCT 36.3 (L) 12/07/2022 1410   PLT 187 12/07/2022 1410   MCV 101.1 (H) 12/07/2022 1410   MCH 34.3 (H) 12/07/2022 1410   MCHC 33.9 12/07/2022 1410   RDW 13.8 12/07/2022 1410   LYMPHSABS 2.6 12/07/2022 1410   MONOABS 0.4 12/07/2022 1410   EOSABS 0.0 12/07/2022 1410   BASOSABS 0.0 12/07/2022 1410      Latest Ref Rng & Units 07/15/2022   10:52 AM 06/02/2022    9:06 AM 03/11/2022    9:05 AM  CMP  Glucose 70 - 99 mg/dL 409     BUN 6 - 20 mg/dL 17     Creatinine 8.11 - 1.24 mg/dL 9.14     Sodium 782 - 956 mmol/L 137     Potassium 3.5 - 5.1 mmol/L 4.1     Chloride 98 - 111 mmol/L 103     CO2 22 - 32 mmol/L 28     Calcium 8.9 - 10.3 mg/dL 8.6     Total Protein 6.5 - 8.1 g/dL 5.8  5.6  5.7   Total Bilirubin 0.3 - 1.2 mg/dL 0.5  0.3  0.7   Alkaline Phos 38 - 126 U/L 92  106  103   AST 15 - 41 U/L 34  43  58   ALT 0 - 44 U/L 38  42  59     DIAGNOSTIC IMAGING:  I have independently reviewed the relevant imaging and discussed with the patient.   WRAP UP:  All questions were answered. The patient knows to call the clinic with any problems, questions or concerns.  Medical decision making: Low  Time spent on visit: I spent 15 minutes counseling the patient face to face. The total time spent in the appointment was 22 minutes and more than 50% was on counseling.  Carnella Guadalajara, PA-C  12/08/22 3:09 PM

## 2022-12-07 NOTE — Progress Notes (Signed)
Howard [name] presents today for theraputic phlebotomy per MD orders. Last hgb/hct on  was 12.3/36.3 .VSS prior to procedure. Verified with Rojelio Brenner RA to conitnue. Pt reports eating before arrival. Procedure started at 1517 using patients right AC. 500 grams of blood removed. Procedure ended at 1327. Gauze and coban applied to [facility], site clean and dry. VSS upon completion of procedure. Pt denies dizziness, lightheadedness, or feeling faint. Observed for 30 minutes post procedure. Discharged in satisfactory condition with follow up instructions.

## 2022-12-07 NOTE — Patient Instructions (Signed)
[facility]  Discharge Instructions: Thank you for choosing [facility] to provide your oncology and hematology care.  If you have a lab appointment with the Cancer Center - please note that after April 8th, 2024, all labs will be drawn in the cancer center.  You do not have to check in or register with the main entrance as you have in the past but will complete your check-in in the cancer center.  Wear comfortable clothing and clothing appropriate for easy access to any Portacath or PICC line.   We strive to give you quality time with your provider. You may need to reschedule your appointment if you arrive late (15 or more minutes).  Arriving late affects you and other patients whose appointments are after yours.  Also, if you miss three or more appointments without notifying the office, you may be dismissed from the clinic at the provider's discretion.      For prescription refill requests, have your pharmacy contact our office and allow 72 hours for refills to be completed.    Today you received the following phlebotomy.  Therapeutic Phlebotomy Therapeutic phlebotomy is the planned removal of blood from a person's body for the purpose of treating a medical condition. The procedure is lot like donating blood. Usually, about a pint (470 mL, or 0.47 L) of blood is removed. The average adult has 9-12 pints (4.3-5.7 L) of blood in his or her body. Therapeutic phlebotomy may be used to treat the following medical conditions: Hemochromatosis. This is a condition in which the blood contains too much iron. Polycythemia vera. This is a condition in which the blood contains too many red blood cells. Porphyria cutanea tarda. This is a disease in which an important part of hemoglobin is not made properly. It results in the buildup of abnormal amounts of porphyrins in the body. Sickle cell disease. This is a condition in which the red blood cells form an abnormal crescent  shape rather than a round shape. Tell a health care provider about: Any allergies you have. All medicines you are taking, including vitamins, herbs, eye drops, creams, and over-the-counter medicines. Any bleeding problems you have. Any surgeries you have had. Any medical conditions you have. Whether you are pregnant or may be pregnant. What are the risks? Generally, this is a safe procedure. However, problems may occur, including: Nausea or light-headedness. Low blood pressure (hypotension). Soreness, bleeding, swelling, or bruising at the needle insertion site. Infection. What happens before the procedure? Ask your health care provider about: Changing or stopping your regular medicines. This is especially important if you are taking diabetes medicines or blood thinners. Taking medicines such as aspirin and ibuprofen. These medicines can thin your blood. Do not take these medicines unless your health care provider tells you to take them. Taking over-the-counter medicines, vitamins, herbs, and supplements. Wear clothing with sleeves that can be raised above the elbow. You may have a blood sample taken. Your blood pressure, pulse rate, and breathing rate will be measured. What happens during the procedure?  You may be given a medicine to numb the area (local anesthetic). A tourniquet will be placed on your arm. A needle will be put into one of your veins. Tubing and a collection bag will be attached to the needle. Blood will flow through the needle and tubing into the collection bag. The collection bag will be placed lower than your arm so gravity can help the blood flow into the bag. You  may be asked to open and close your hand slowly and continually during the entire collection. After the specified amount of blood has been removed from your body, the collection bag and tubing will be clamped. The needle will be removed from your vein. Pressure will be held on the needle site to stop  the bleeding. A bandage (dressing) will be placed over the needle insertion site. The procedure may vary among health care providers and hospitals. What happens after the procedure? Your blood pressure, pulse rate, and breathing rate will be measured after the procedure. You will be encouraged to drink fluids. You will be encouraged to eat a snack to prevent a low blood sugar level. Your recovery will be assessed and monitored. Return to your normal activities as told by your health care provider. Summary Therapeutic phlebotomy is the planned removal of blood from a person's body for the purpose of treating a medical condition. Therapeutic phlebotomy may be used to treat hemochromatosis, polycythemia vera, porphyria cutanea tarda, or sickle cell disease. In the procedure, a needle is inserted and about a pint (470 mL, or 0.47 L) of blood is removed. The average adult has 9-12 pints (4.3-5.7 L) of blood in the body. This is generally a safe procedure, but it can sometimes cause problems such as nausea, light-headedness, or low blood pressure (hypotension). This information is not intended to replace advice given to you by your health care provider. Make sure you discuss any questions you have with your health care provider. Document Revised: 10/29/2020 Document Reviewed: 10/29/2020 Elsevier Patient Education  2024 Elsevier Inc.      To help prevent nausea and vomiting after your treatment, we encourage you to take your nausea medication as directed.  BELOW ARE SYMPTOMS THAT SHOULD BE REPORTED IMMEDIATELY: *FEVER GREATER THAN 100.4 F (38 C) OR HIGHER *CHILLS OR SWEATING *NAUSEA AND VOMITING THAT IS NOT CONTROLLED WITH YOUR NAUSEA MEDICATION *UNUSUAL SHORTNESS OF BREATH *UNUSUAL BRUISING OR BLEEDING *URINARY PROBLEMS (pain or burning when urinating, or frequent urination) *BOWEL PROBLEMS (unusual diarrhea, constipation, pain near the anus) TENDERNESS IN MOUTH AND THROAT WITH OR WITHOUT  PRESENCE OF ULCERS (sore throat, sores in mouth, or a toothache) UNUSUAL RASH, SWELLING OR PAIN  UNUSUAL VAGINAL DISCHARGE OR ITCHING   Items with * indicate a potential emergency and should be followed up as soon as possible or go to the Emergency Department if any problems should occur.  Please show the CHEMOTHERAPY ALERT CARD or IMMUNOTHERAPY ALERT CARD at check-in to the Emergency Department and triage nurse.  Should you have questions after your visit or need to cancel or reschedule your appointment, please contact [facility] [phone]  and follow the prompts.  Office hours are 8:00 a.m. to 4:30 p.m. Monday - Friday. Please note that voicemails left after 4:00 p.m. may not be returned until the following business day.  We are closed weekends and major holidays. You have access to a nurse at all times for urgent questions. Please call the main number to the clinic [phone] and follow the prompts.  For any non-urgent questions, you may also contact your provider using MyChart. We now offer e-Visits for anyone 23 and older to request care online for non-urgent symptoms. For details visit [email or web address].   Also download the MyChart app! Go to the app store, search "MyChart", open the app, select [place], and log in with your MyChart username and password.

## 2022-12-08 ENCOUNTER — Inpatient Hospital Stay (HOSPITAL_BASED_OUTPATIENT_CLINIC_OR_DEPARTMENT_OTHER): Payer: 59 | Admitting: Physician Assistant

## 2022-12-08 ENCOUNTER — Encounter: Payer: Self-pay | Admitting: Physician Assistant

## 2022-12-14 ENCOUNTER — Inpatient Hospital Stay: Payer: 59

## 2022-12-14 LAB — CBC
HCT: 37.3 % — ABNORMAL LOW (ref 39.0–52.0)
Hemoglobin: 12.7 g/dL — ABNORMAL LOW (ref 13.0–17.0)
MCH: 34.7 pg — ABNORMAL HIGH (ref 26.0–34.0)
MCHC: 34 g/dL (ref 30.0–36.0)
MCV: 101.9 fL — ABNORMAL HIGH (ref 80.0–100.0)
Platelets: 206 10*3/uL (ref 150–400)
RBC: 3.66 MIL/uL — ABNORMAL LOW (ref 4.22–5.81)
RDW: 13.4 % (ref 11.5–15.5)
WBC: 7.7 10*3/uL (ref 4.0–10.5)
nRBC: 0 % (ref 0.0–0.2)

## 2022-12-14 NOTE — Progress Notes (Signed)
Patients hgb 12.7 today for phlebotomy.  Attempted twice for IV stick but unsuccessful.  Patient stated he did not want to try a third time and he would come back next week as scheduled.  No complaints voiced.  No bruising or swelling noted at both sites.  Discharged in stable condition with no s/s of distress noted.

## 2022-12-20 ENCOUNTER — Encounter: Payer: Self-pay | Admitting: Internal Medicine

## 2022-12-21 ENCOUNTER — Inpatient Hospital Stay: Payer: 59 | Attending: Hematology

## 2022-12-21 ENCOUNTER — Inpatient Hospital Stay: Payer: 59

## 2022-12-21 LAB — CBC
HCT: 37.3 % — ABNORMAL LOW (ref 39.0–52.0)
Hemoglobin: 12.5 g/dL — ABNORMAL LOW (ref 13.0–17.0)
MCH: 34 pg (ref 26.0–34.0)
MCHC: 33.5 g/dL (ref 30.0–36.0)
MCV: 101.4 fL — ABNORMAL HIGH (ref 80.0–100.0)
Platelets: 186 10*3/uL (ref 150–400)
RBC: 3.68 MIL/uL — ABNORMAL LOW (ref 4.22–5.81)
RDW: 13.2 % (ref 11.5–15.5)
WBC: 8.8 10*3/uL (ref 4.0–10.5)
nRBC: 0 % (ref 0.0–0.2)

## 2022-12-21 NOTE — Progress Notes (Signed)
Howard [name] presents today for phlebotomy per MD orders. Phlebotomy procedure started at 1418 and ended at 59. 516 cc removed. Patient tolerated procedure well. IV needle removed intact.  Patient tolerated phlebotomy with no complaints voiced.  Peripheral IV site intact with no bruising or swelling noted.  Denied SOB, chest pain, or dizziness.  Gauze with coban applied.  Discharged with VSS and no s/s of distress noted.

## 2022-12-21 NOTE — Patient Instructions (Signed)

## 2022-12-28 ENCOUNTER — Inpatient Hospital Stay: Payer: 59

## 2022-12-28 LAB — CBC
HCT: 39.9 % (ref 39.0–52.0)
Hemoglobin: 13.4 g/dL (ref 13.0–17.0)
MCH: 33.9 pg (ref 26.0–34.0)
MCHC: 33.6 g/dL (ref 30.0–36.0)
MCV: 101 fL — ABNORMAL HIGH (ref 80.0–100.0)
Platelets: 188 10*3/uL (ref 150–400)
RBC: 3.95 MIL/uL — ABNORMAL LOW (ref 4.22–5.81)
RDW: 13.2 % (ref 11.5–15.5)
WBC: 6.5 10*3/uL (ref 4.0–10.5)
nRBC: 0 % (ref 0.0–0.2)

## 2022-12-28 NOTE — Patient Instructions (Signed)

## 2022-12-28 NOTE — Progress Notes (Signed)
Howard [name] presents today for phlebotomy per MD orders. Phlebotomy procedure started at 1503 and ended at 1515. 300 cc removed. Patient tolerated procedure well. IV needle removed intact.  Patient tolerated phlebotomy with no complaints voiced.  Peripheral IV site intact with no bruising or swelling noted.  Denied SOB, chest pain, or dizziness.  Gauze with coban applied.  Discharged with VSS and no s/s of distress noted.

## 2023-01-04 ENCOUNTER — Inpatient Hospital Stay: Payer: 59

## 2023-01-06 ENCOUNTER — Other Ambulatory Visit: Payer: Self-pay | Admitting: *Deleted

## 2023-01-06 DIAGNOSIS — I7121 Aneurysm of the ascending aorta, without rupture: Secondary | ICD-10-CM

## 2023-01-10 ENCOUNTER — Telehealth: Payer: Self-pay | Admitting: *Deleted

## 2023-01-10 NOTE — Telephone Encounter (Signed)
A message was left re: scheduling his ct chest.

## 2023-01-11 ENCOUNTER — Inpatient Hospital Stay: Payer: 59

## 2023-01-11 LAB — CBC
HCT: 37.2 % — ABNORMAL LOW (ref 39.0–52.0)
Hemoglobin: 12.6 g/dL — ABNORMAL LOW (ref 13.0–17.0)
MCH: 34.1 pg — ABNORMAL HIGH (ref 26.0–34.0)
MCHC: 33.9 g/dL (ref 30.0–36.0)
MCV: 100.8 fL — ABNORMAL HIGH (ref 80.0–100.0)
Platelets: 179 10*3/uL (ref 150–400)
RBC: 3.69 MIL/uL — ABNORMAL LOW (ref 4.22–5.81)
RDW: 13.2 % (ref 11.5–15.5)
WBC: 6.1 10*3/uL (ref 4.0–10.5)
nRBC: 0 % (ref 0.0–0.2)

## 2023-01-11 NOTE — Progress Notes (Signed)
Patient presents today for phlebotomy per MD orders. Phlebotomy procedure started at 1445 and ended at 1515. 315 removed. Patient tolerated procedure well. Procedure tolerated well and without incident. Discharged ambulatory in stable condition.

## 2023-01-11 NOTE — Patient Instructions (Signed)
[facility] [place]  Discharge Instructions: Thank you for choosing [facility] to provide your oncology and hematology care.  If you have a lab appointment with the Cancer Center - please note that after April 8th, 2024, all labs will be drawn in the cancer center.  You do not have to check in or register with the main entrance as you have in the past but will complete your check-in in the cancer center.  Wear comfortable clothing and clothing appropriate for easy access to any Portacath or PICC line.   We strive to give you quality time with your provider. You may need to reschedule your appointment if you arrive late (15 or more minutes).  Arriving late affects you and other patients whose appointments are after yours.  Also, if you miss three or more appointments without notifying the office, you may be dismissed from the clinic at the provider's discretion.      For prescription refill requests, have your pharmacy contact our office and allow 72 hours for refills to be completed.    Today you received the following chemotherapy and/or immunotherapy agents Phlebotomy      To help prevent nausea and vomiting after your treatment, we encourage you to take your nausea medication as directed.  BELOW ARE SYMPTOMS THAT SHOULD BE REPORTED IMMEDIATELY: *FEVER GREATER THAN 100.4 F (38 C) OR HIGHER *CHILLS OR SWEATING *NAUSEA AND VOMITING THAT IS NOT CONTROLLED WITH YOUR NAUSEA MEDICATION *UNUSUAL SHORTNESS OF BREATH *UNUSUAL BRUISING OR BLEEDING *URINARY PROBLEMS (pain or burning when urinating, or frequent urination) *BOWEL PROBLEMS (unusual diarrhea, constipation, pain near the anus) TENDERNESS IN MOUTH AND THROAT WITH OR WITHOUT PRESENCE OF ULCERS (sore throat, sores in mouth, or a toothache) UNUSUAL RASH, SWELLING OR PAIN  UNUSUAL VAGINAL DISCHARGE OR ITCHING   Items with * indicate a potential emergency and should be followed up as soon as possible or go to the  Emergency Department if any problems should occur.  Please show the CHEMOTHERAPY ALERT CARD or IMMUNOTHERAPY ALERT CARD at check-in to the Emergency Department and triage nurse.  Should you have questions after your visit or need to cancel or reschedule your appointment, please contact [facility] [place] [phone]  and follow the prompts.  Office hours are 8:00 a.m. to 4:30 p.m. Monday - Friday. Please note that voicemails left after 4:00 p.m. may not be returned until the following business day.  We are closed weekends and major holidays. You have access to a nurse at all times for urgent questions. Please call the main number to the clinic [phone] and follow the prompts.  For any non-urgent questions, you may also contact your provider using MyChart. We now offer e-Visits for anyone 18 and older to request care online for non-urgent symptoms. For details visit [email or web address].   Also download the MyChart app! Go to the app store, search "MyChart", open the app, select [facility], and log in with your MyChart username and password.   

## 2023-01-18 ENCOUNTER — Inpatient Hospital Stay: Payer: 59

## 2023-01-25 ENCOUNTER — Inpatient Hospital Stay: Payer: 59

## 2023-01-25 ENCOUNTER — Inpatient Hospital Stay: Payer: 59 | Attending: Hematology

## 2023-01-25 LAB — CBC
HCT: 35.9 % — ABNORMAL LOW (ref 39.0–52.0)
Hemoglobin: 12.3 g/dL — ABNORMAL LOW (ref 13.0–17.0)
MCH: 33.7 pg (ref 26.0–34.0)
MCHC: 34.3 g/dL (ref 30.0–36.0)
MCV: 98.4 fL (ref 80.0–100.0)
Platelets: 172 10*3/uL (ref 150–400)
RBC: 3.65 MIL/uL — ABNORMAL LOW (ref 4.22–5.81)
RDW: 12.3 % (ref 11.5–15.5)
WBC: 6.5 10*3/uL (ref 4.0–10.5)
nRBC: 0 % (ref 0.0–0.2)

## 2023-01-25 NOTE — Progress Notes (Signed)
Aram Candela Rancourt presents today for theraputic phlebotomy per MD orders. Last hgb 12.3 /hct 35.9 was on 01/25/23 VSS prior to procedure. Pt reports eating before arrival. Procedure started at 1458 using patients right wrist. 500 mL of blood removed. Procedure ended at  . Gauze and coban applied to wrist, site clean and dry. VSS upon completion of procedure. Pt denies dizziness, lightheadedness, or feeling faint. Observed for 20 minutes post procedure. Discharged in satisfactory condition with follow up instructions.

## 2023-01-25 NOTE — Patient Instructions (Signed)
[facility] [place]  Discharge Instructions: Thank you for choosing [facility] to provide your oncology and hematology care.  If you have a lab appointment with the Cancer Center - please note that after April 8th, 2024, all labs will be drawn in the cancer center.  You do not have to check in or register with the main entrance as you have in the past but will complete your check-in in the cancer center.  Wear comfortable clothing and clothing appropriate for easy access to any Portacath or PICC line.   We strive to give you quality time with your provider. You may need to reschedule your appointment if you arrive late (15 or more minutes).  Arriving late affects you and other patients whose appointments are after yours.  Also, if you miss three or more appointments without notifying the office, you may be dismissed from the clinic at the provider's discretion.      For prescription refill requests, have your pharmacy contact our office and allow 72 hours for refills to be completed.    Today you received therapeutic phlebotomy      BELOW ARE SYMPTOMS THAT SHOULD BE REPORTED IMMEDIATELY: *FEVER GREATER THAN 100.4 F (38 C) OR HIGHER *CHILLS OR SWEATING *NAUSEA AND VOMITING THAT IS NOT CONTROLLED WITH YOUR NAUSEA MEDICATION *UNUSUAL SHORTNESS OF BREATH *UNUSUAL BRUISING OR BLEEDING *URINARY PROBLEMS (pain or burning when urinating, or frequent urination) *BOWEL PROBLEMS (unusual diarrhea, constipation, pain near the anus) TENDERNESS IN MOUTH AND THROAT WITH OR WITHOUT PRESENCE OF ULCERS (sore throat, sores in mouth, or a toothache) UNUSUAL RASH, SWELLING OR PAIN  UNUSUAL VAGINAL DISCHARGE OR ITCHING   Items with * indicate a potential emergency and should be followed up as soon as possible or go to the Emergency Department if any problems should occur.  Please show the CHEMOTHERAPY ALERT CARD or IMMUNOTHERAPY ALERT CARD at check-in to the Emergency Department  and triage nurse.  Should you have questions after your visit or need to cancel or reschedule your appointment, please contact [facility] [place] [phone]  and follow the prompts.  Office hours are 8:00 a.m. to 4:30 p.m. Monday - Friday. Please note that voicemails left after 4:00 p.m. may not be returned until the following business day.  We are closed weekends and major holidays. You have access to a nurse at all times for urgent questions. Please call the main number to the clinic [phone] and follow the prompts.  For any non-urgent questions, you may also contact your provider using MyChart. We now offer e-Visits for anyone 18 and older to request care online for non-urgent symptoms. For details visit [email or web address].   Also download the MyChart app! Go to the app store, search "MyChart", open the app, select [place], and log in with your MyChart username and password.   

## 2023-01-26 ENCOUNTER — Other Ambulatory Visit: Payer: Self-pay | Admitting: Interventional Cardiology

## 2023-02-01 ENCOUNTER — Inpatient Hospital Stay: Payer: 59

## 2023-02-01 LAB — CBC
HCT: 38 % — ABNORMAL LOW (ref 39.0–52.0)
Hemoglobin: 12.6 g/dL — ABNORMAL LOW (ref 13.0–17.0)
MCH: 33 pg (ref 26.0–34.0)
MCHC: 33.2 g/dL (ref 30.0–36.0)
MCV: 99.5 fL (ref 80.0–100.0)
Platelets: 210 10*3/uL (ref 150–400)
RBC: 3.82 MIL/uL — ABNORMAL LOW (ref 4.22–5.81)
RDW: 12.8 % (ref 11.5–15.5)
WBC: 5.9 10*3/uL (ref 4.0–10.5)
nRBC: 0 % (ref 0.0–0.2)

## 2023-02-01 NOTE — Patient Instructions (Signed)
[facility] [facility]  Discharge Instructions: Thank you for choosing [facility] to provide your oncology and hematology care.  If you have a lab appointment with the Cancer Center - please note that after April 8th, 2024, all labs will be drawn in the cancer center.  You do not have to check in or register with the main entrance as you have in the past but will complete your check-in in the cancer center.  Wear comfortable clothing and clothing appropriate for easy access to any Portacath or PICC line.   We strive to give you quality time with your provider. You may need to reschedule your appointment if you arrive late (15 or more minutes).  Arriving late affects you and other patients whose appointments are after yours.  Also, if you miss three or more appointments without notifying the office, you may be dismissed from the clinic at the provider's discretion.      For prescription refill requests, have your pharmacy contact our office and allow 72 hours for refills to be completed.    Today you received the following phlebotomy.     To help prevent nausea and vomiting after your treatment, we encourage you to take your nausea medication as directed.  BELOW ARE SYMPTOMS THAT SHOULD BE REPORTED IMMEDIATELY: *FEVER GREATER THAN 100.4 F (38 C) OR HIGHER *CHILLS OR SWEATING *NAUSEA AND VOMITING THAT IS NOT CONTROLLED WITH YOUR NAUSEA MEDICATION *UNUSUAL SHORTNESS OF BREATH *UNUSUAL BRUISING OR BLEEDING *URINARY PROBLEMS (pain or burning when urinating, or frequent urination) *BOWEL PROBLEMS (unusual diarrhea, constipation, pain near the anus) TENDERNESS IN MOUTH AND THROAT WITH OR WITHOUT PRESENCE OF ULCERS (sore throat, sores in mouth, or a toothache) UNUSUAL RASH, SWELLING OR PAIN  UNUSUAL VAGINAL DISCHARGE OR ITCHING   Items with * indicate a potential emergency and should be followed up as soon as possible or go to the Emergency Department if any problems  should occur.  Please show the CHEMOTHERAPY ALERT CARD or IMMUNOTHERAPY ALERT CARD at check-in to the Emergency Department and triage nurse.  Should you have questions after your visit or need to cancel or reschedule your appointment, please contact [facility] AT [facility] [phone]  and follow the prompts.  Office hours are 8:00 a.m. to 4:30 p.m. Monday - Friday. Please note that voicemails left after 4:00 p.m. may not be returned until the following business day.  We are closed weekends and major holidays. You have access to a nurse at all times for urgent questions. Please call the main number to the clinic [phone] and follow the prompts.  For any non-urgent questions, you may also contact your provider using MyChart. We now offer e-Visits for anyone 61 and older to request care online for non-urgent symptoms. For details visit [email or web address].   Also download the MyChart app! Go to the app store, search "MyChart", open the app, select [place], and log in with your MyChart username and password.

## 2023-02-01 NOTE — Progress Notes (Signed)
Howard [name] presents today for theraputic phlebotomy per MD orders. Hgb/hct was 12.6/38 .VSS prior to procedure. Pt reports eating before arrival. Procedure started at 1503 using patients right AC. 500 grams of blood removed. Procedure ended at 1519. Gauze and coban applied to [facility], site clean and dry. VSS upon completion of procedure. Pt denies dizziness, lightheadedness, or feeling faint. Observed for 30 minutes post procedure. Discharged in satisfactory condition with follow up instructions.

## 2023-02-02 NOTE — Progress Notes (Signed)
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  He states that it is feeling pretty good have not had a whole lot of pain with it.  Objective: Vital signs are stable alert oriented x 3.  Pulses are palpable.  Incision site is healing well there is no signs of infection.  Assessment: Well-healing surgical foot.  Plan: Follow-up with me in another week or so for suture removal.  Continue nonweightbearing status.

## 2023-02-04 ENCOUNTER — Inpatient Hospital Stay: Admission: RE | Admit: 2023-02-04 | Payer: 59 | Source: Ambulatory Visit

## 2023-02-04 ENCOUNTER — Ambulatory Visit: Payer: 59 | Admitting: Internal Medicine

## 2023-02-04 ENCOUNTER — Encounter: Payer: Self-pay | Admitting: Internal Medicine

## 2023-02-04 DIAGNOSIS — R7989 Other specified abnormal findings of blood chemistry: Secondary | ICD-10-CM

## 2023-02-04 NOTE — Progress Notes (Unsigned)
Primary Care Physician:  Elfredia Nevins, MD Primary Gastroenterologist:  Dr. Jena Gauss  Pre-Procedure History & Physical: HPI:  Howard [name] is a 57 y.o. male here for follow-up of homozygous C282Y hemochromatosis.  Ongoing phlebotomies through hematology.  Ferritin down in the 800 range (drop of about 2000 points).  LFTs trending down.  No evidence of portal hypertension on prior ultrasound MRI.  Spleen appeared normal.  Mild dilation of biliary tree of doubtful clinical significance.  Elastography K PA 5.7  Generally feeling much better.  He still is being treated actively for psoriatic/rheumatoid arthritis;  no alcohol in 5 years.  Past Medical History:  Diagnosis Date   Alcohol abuse    Anxiety    Chronic back pain    Chronic pain    Depression    Fall    Fracture of right elbow    Hereditary hemochromatosis (HCC) 06/01/2022   Osteoarthritis    Psoriasis     Past Surgical History:  Procedure Laterality Date   APPENDECTOMY     COLONOSCOPY WITH PROPOFOL N/A 06/07/2019   diverticulosis, otherwise normal. Non-bleeding internal hemorrhoids.    FOOT SURGERY     per patient: "neurofibroma excision"   LUMBAR LAMINECTOMY/DECOMPRESSION MICRODISCECTOMY Bilateral 12/31/2015   Procedure: LUMBER DECOMPRESSION L4-5/ L5-S1 BILATERALLY  ;  Surgeon: Jene Every, MD;  Location: [facility];  Service: Orthopedics;  Laterality: Bilateral;   REVERSE SHOULDER ARTHROPLASTY Right 10/24/2018   REVERSE SHOULDER ARTHROPLASTY Right 10/24/2018   Procedure: REVERSE SHOULDER ARTHROPLASTY;  Surgeon: Yolonda Kida, MD;  Location[facility];  Service: Orthopedics;  Laterality: Right;   SHOULDER SURGERY Right    TOTAL HIP ARTHROPLASTY Right    TOTAL HIP ARTHROPLASTY Left 08/27/2014   Procedure: LEFT TOTAL HIP ARTHROPLASTY ANTERIOR APPROACH;  Surgeon: Durene Romans, MD;  Location: [facility];  Service: Orthopedics;  Laterality: Left;   XI ROBOTIC ASSISTED INGUINAL HERNIA REPAIR WITH MESH Right 05/12/2022    Procedure: XI ROBOTIC ASSISTED INGUINAL HERNIA REPAIR WITH MESH, RIGHT;  Surgeon: Franky Macho, MD;  Location: [facility];  Service: General;  Laterality: Right;    Prior to Admission medications   Medication Sig Start Date End Date Taking? Authorizing Provider  ALPRAZolam Prudy Feeler) 0.5 MG tablet Take 0.5 mg by mouth 2 (two) times daily as needed for anxiety.   Yes [provider]  amphetamine-dextroamphetamine (ADDERALL) 20 MG tablet Take 20 mg by mouth 3 (three) times daily. 01/20/23  Yes [provider]  Buprenorphine HCl-Naloxone HCl 8-2 MG FILM Place under the tongue. 05/21/22  Yes [provider]  folic acid (FOLVITE) 1 MG tablet Take 1 mg by mouth daily. 03/19/21  Yes [provider]  Golimumab (SIMPONI ARIA IV) Inject 1 Dose into the vein every 8 (eight) weeks.   Yes [provider]  levothyroxine (SYNTHROID, LEVOTHROID) 25 MCG tablet Take 25 mcg by mouth daily before breakfast.  06/26/18  Yes [provider]  Methotrexate Sodium (METHOTREXATE, PF,) 50 MG/2ML injection Inject 0.7 mLs into the muscle once a week.   Yes [provider]  omeprazole (PRILOSEC) 20 MG capsule Take 20 mg by mouth daily as needed (acid reflux). 02/07/22  Yes [provider]  predniSONE (DELTASONE) 5 MG tablet Take 5-10 mg by mouth daily as needed (arthritis).   Yes [provider]  rosuvastatin (CRESTOR) 5 MG tablet TAKE 1 TABLET (5 MG TOTAL) BY MOUTH DAILY. 01/26/23  Yes Corky Crafts, MD  tamsulosin (FLOMAX) 0.4 MG CAPS capsule Take 0.4 mg by  mouth daily.   Yes [provider]    Allergies as of 02/04/2023   (No Known Allergies)    Family History  Problem Relation Age of Onset   Heart attack Mother    Colon cancer Neg Hx     Social History   Socioeconomic History   Marital status: Married    Spouse name: Not on file   Number of children: Not on file   Years of education: Not on file   Highest education level: Not  on file  Occupational History   Not on file  Tobacco Use   Smoking status: Every Day    Current packs/day: 1.00    Average packs/day: 1 pack/day for 32.0 years (32.0 ttl pk-yrs)    Types: Cigarettes    Passive exposure: Current   Smokeless tobacco: Never  Vaping Use   Vaping status: Never Used  Substance and Sexual Activity   Alcohol use: Not Currently    Alcohol/week: 16.0 standard drinks of alcohol    Types: 6 Glasses of wine, 10 Cans of beer per week    Comment: None currently (last ETOH 03/01/2018); previously drinks 3 to 4 beers daily and 1 glass of wine; "way more" on the weekend   Drug use: Yes    Types: Marijuana    Comment: hx of marijuana use years ago    Sexual activity: Not Currently  Other Topics Concern   Not on file  Social History Narrative   Not on file   Social Determinants of Health   Financial Resource Strain: Not on file  Food Insecurity: No Food Insecurity (06/01/2022)   Hunger Vital Sign    Worried About Running Out of Food in the Last Year: Never true    Ran Out of Food in the Last Year: Never true  Transportation Needs: No Transportation Needs (06/01/2022)   PRAPARE - Administrator, Civil Service (Medical): No    Lack of Transportation (Non-Medical): No  Physical Activity: Not on file  Stress: Not on file  Social Connections: Not on file  Intimate Partner Violence: Not At Risk (06/01/2022)   Humiliation, Afraid, Rape, and Kick questionnaire    Fear of Current or Ex-Partner: No    Emotionally Abused: No    Physically Abused: No    Sexually Abused: No    Review of Systems: See HPI, otherwise negative ROS  Physical Exam: BP 109/71 (BP Location: Right Arm, Patient Position: Sitting, Cuff Size: Normal)   Pulse 77   Temp 98.4 F (36.9 C) (Oral)   Ht 5\' 9"  (1.753 m)   Wt 158 lb 6.4 oz (71.8 kg)   SpO2 99%   BMI 23.39 kg/m  General:   Alert,  Well-developed, well-nourished, pleasant and cooperative in NAD cervical  adenopathy. Lungs:  Clear throughout to auscultation.   No wheezes, crackles, or rhonchi. No acute distress. Heart:  Regular rate and rhythm; no murmurs, clicks, rubs,  or gallops. Abdomen: Non-distended, normal bowel sounds.  Soft and nontender without appreciable mass or hepatosplenomegaly.    Impression/Plan: Pleasant 57 year old gentleman C282 homozygous hemochromatosis.  Ongoing phlebotomies.  Ferritin coming down -  he is feeling better.  Still with arthritic issues.  LFTs trending down.  No evidence of advanced liver disease on noninvasive imaging including elastography/MRI  Recommendations:  Continued abstinence from alcohol emphasized  Hepatic function profile and INR today  Further recommendations to follow  Office visit with [place] in 6 months   Notice: This dictation was prepared  with Dragon dictation along with smaller phrase technology. Any transcriptional errors that result from this process are unintentional and may not be corrected upon review.

## 2023-02-04 NOTE — Patient Instructions (Signed)
It was great to see you again today!  Everything is going our way! Lets get a hepatic function profile and INR today  I will let you know about the labs when I get the results  Will plan to see you back in 6 months and as needed.

## 2023-02-08 ENCOUNTER — Inpatient Hospital Stay: Payer: 59

## 2023-02-15 ENCOUNTER — Inpatient Hospital Stay: Payer: 59 | Attending: Hematology

## 2023-02-15 ENCOUNTER — Inpatient Hospital Stay: Payer: 59

## 2023-02-15 LAB — CBC
HCT: 39.2 % (ref 39.0–52.0)
Hemoglobin: 13 g/dL (ref 13.0–17.0)
MCH: 33 pg (ref 26.0–34.0)
MCHC: 33.2 g/dL (ref 30.0–36.0)
MCV: 99.5 fL (ref 80.0–100.0)
Platelets: 182 10*3/uL (ref 150–400)
RBC: 3.94 MIL/uL — ABNORMAL LOW (ref 4.22–5.81)
RDW: 12.9 % (ref 11.5–15.5)
WBC: 5.8 10*3/uL (ref 4.0–10.5)
nRBC: 0 % (ref 0.0–0.2)

## 2023-02-15 NOTE — Progress Notes (Signed)
Patient presents today for phlebotomy per MD orders. Phlebotomy procedure started at 1450 and ended at 1505. 500 cc removed. Patient tolerated procedure well. Procedure tolerated well and without incident. Vital signs stable.  Discharged ambulatory in stable condition.

## 2023-02-15 NOTE — Patient Instructions (Signed)
[facility] [place]  Discharge Instructions: Thank you for choosing [facility] to provide your oncology and hematology care.  If you have a lab appointment with the Cancer Center - please note that after April 8th, 2024, all labs will be drawn in the cancer center.  You do not have to check in or register with the main entrance as you have in the past but will complete your check-in in the cancer center.  Wear comfortable clothing and clothing appropriate for easy access to any Portacath or PICC line.   We strive to give you quality time with your provider. You may need to reschedule your appointment if you arrive late (15 or more minutes).  Arriving late affects you and other patients whose appointments are after yours.  Also, if you miss three or more appointments without notifying the office, you may be dismissed from the clinic at the provider's discretion.      For prescription refill requests, have your pharmacy contact our office and allow 72 hours for refills to be completed.    Today you received the following chemotherapy and/or immunotherapy agents Phlebotomy      To help prevent nausea and vomiting after your treatment, we encourage you to take your nausea medication as directed.  BELOW ARE SYMPTOMS THAT SHOULD BE REPORTED IMMEDIATELY: *FEVER GREATER THAN 100.4 F (38 C) OR HIGHER *CHILLS OR SWEATING *NAUSEA AND VOMITING THAT IS NOT CONTROLLED WITH YOUR NAUSEA MEDICATION *UNUSUAL SHORTNESS OF BREATH *UNUSUAL BRUISING OR BLEEDING *URINARY PROBLEMS (pain or burning when urinating, or frequent urination) *BOWEL PROBLEMS (unusual diarrhea, constipation, pain near the anus) TENDERNESS IN MOUTH AND THROAT WITH OR WITHOUT PRESENCE OF ULCERS (sore throat, sores in mouth, or a toothache) UNUSUAL RASH, SWELLING OR PAIN  UNUSUAL VAGINAL DISCHARGE OR ITCHING   Items with * indicate a potential emergency and should be followed up as soon as possible or go to the  Emergency Department if any problems should occur.  Please show the CHEMOTHERAPY ALERT CARD or IMMUNOTHERAPY ALERT CARD at check-in to the Emergency Department and triage nurse.  Should you have questions after your visit or need to cancel or reschedule your appointment, please contact [facility] [place] [phone]  and follow the prompts.  Office hours are 8:00 a.m. to 4:30 p.m. Monday - Friday. Please note that voicemails left after 4:00 p.m. may not be returned until the following business day.  We are closed weekends and major holidays. You have access to a nurse at all times for urgent questions. Please call the main number to the clinic [phone] and follow the prompts.  For any non-urgent questions, you may also contact your provider using MyChart. We now offer e-Visits for anyone 18 and older to request care online for non-urgent symptoms. For details visit [email or web address].   Also download the MyChart app! Go to the app store, search "MyChart", open the app, select [facility], and log in with your MyChart username and password.   

## 2023-02-16 NOTE — Progress Notes (Signed)
Was collected today

## 2023-02-17 LAB — HEPATIC FUNCTION PANEL
AG Ratio: 2.8 (calc) — ABNORMAL HIGH (ref 1.0–2.5)
ALT: 52 U/L — ABNORMAL HIGH (ref 9–46)
AST: 56 U/L — ABNORMAL HIGH (ref 10–35)
Albumin: 3.9 g/dL (ref 3.6–5.1)
Alkaline phosphatase (APISO): 87 U/L (ref 35–144)
Bilirubin, Direct: 0.1 mg/dL (ref 0.0–0.2)
Globulin: 1.4 g/dL — ABNORMAL LOW (ref 1.9–3.7)
Indirect Bilirubin: 0.5 mg/dL (ref 0.2–1.2)
Total Bilirubin: 0.6 mg/dL (ref 0.2–1.2)
Total Protein: 5.3 g/dL — ABNORMAL LOW (ref 6.1–8.1)

## 2023-02-17 LAB — PROTIME-INR
INR: 1
Prothrombin Time: 10.9 s (ref 9.0–11.5)

## 2023-02-21 ENCOUNTER — Ambulatory Visit
Admission: RE | Admit: 2023-02-21 | Discharge: 2023-02-21 | Disposition: A | Discharge status: Home / Self Care | Payer: 59 | Source: Ambulatory Visit | Attending: Interventional Cardiology | Admitting: Interventional Cardiology

## 2023-02-21 DIAGNOSIS — I7121 Aneurysm of the ascending aorta, without rupture: Secondary | ICD-10-CM

## 2023-02-21 MED ORDER — IOPAMIDOL (ISOVUE-370) INJECTION 76%
500.0000 mL | Freq: Once | INTRAVENOUS | Status: AC | PRN
  Administered 2023-02-21: 75 mL via INTRAVENOUS

## 2023-02-22 ENCOUNTER — Inpatient Hospital Stay: Payer: 59

## 2023-02-22 LAB — CBC
HCT: 36.5 % — ABNORMAL LOW (ref 39.0–52.0)
Hemoglobin: 12.4 g/dL — ABNORMAL LOW (ref 13.0–17.0)
MCH: 33.9 pg (ref 26.0–34.0)
MCHC: 34 g/dL (ref 30.0–36.0)
MCV: 99.7 fL (ref 80.0–100.0)
Platelets: 189 10*3/uL (ref 150–400)
RBC: 3.66 MIL/uL — ABNORMAL LOW (ref 4.22–5.81)
RDW: 12.9 % (ref 11.5–15.5)
WBC: 5.7 10*3/uL (ref 4.0–10.5)
nRBC: 0 % (ref 0.0–0.2)

## 2023-02-22 NOTE — Progress Notes (Signed)
Aram Candela Buonocore presents today for theraputic phlebotomy per MD orders. Last hgb/hct on 02/22/2023 was 12.4/36.5 .VSS prior to procedure. Pt reports eating before arrival. Procedure started at 1503 using patients R arm. 500 grams of blood removed. Procedure ended at 1518. Gauze and coban applied to [facility], site clean and dry. VSS upon completion of procedure. Pt denies dizziness, lightheadedness, or feeling faint. Patient refused to wait the whole 30 minute recommended post phlebotomy wait time.  Patient stayed about 20 minutes.  Patient left ambulatory in stable condition.

## 2023-02-22 NOTE — Patient Instructions (Signed)

## 2023-03-01 ENCOUNTER — Inpatient Hospital Stay: Payer: 59

## 2023-03-08 ENCOUNTER — Inpatient Hospital Stay: Payer: 59

## 2023-03-08 LAB — CBC
HCT: 36 % — ABNORMAL LOW (ref 39.0–52.0)
Hemoglobin: 11.9 g/dL — ABNORMAL LOW (ref 13.0–17.0)
MCH: 32.7 pg (ref 26.0–34.0)
MCHC: 33.1 g/dL (ref 30.0–36.0)
MCV: 98.9 fL (ref 80.0–100.0)
Platelets: 186 10*3/uL (ref 150–400)
RBC: 3.64 MIL/uL — ABNORMAL LOW (ref 4.22–5.81)
RDW: 13.2 % (ref 11.5–15.5)
WBC: 5.8 10*3/uL (ref 4.0–10.5)
nRBC: 0 % (ref 0.0–0.2)

## 2023-03-08 NOTE — Progress Notes (Signed)
Howard [name] presents today for phlebotomy per MD orders. Phlebotomy procedure started at 1438 and ended at 1445. 250 grams removed. Patient observed for 15 minutes after procedure without any incident. Patient tolerated procedure well. IV needle removed intact.   Vitals stable and discharged home from clinic ambulatory. Follow up as scheduled.

## 2023-03-08 NOTE — Patient Instructions (Signed)
[facility]  Discharge Instructions: Thank you for choosing [facility] to provide your oncology and hematology care.  If you have a lab appointment with the Cancer Center - please note that after April 8th, 2024, all labs will be drawn in the cancer center.  You do not have to check in or register with the main entrance as you have in the past but will complete your check-in in the cancer center.  Wear comfortable clothing and clothing appropriate for easy access to any Portacath or PICC line.   We strive to give you quality time with your provider. You may need to reschedule your appointment if you arrive late (15 or more minutes).  Arriving late affects you and other patients whose appointments are after yours.  Also, if you miss three or more appointments without notifying the office, you may be dismissed from the clinic at the provider's discretion.      For prescription refill requests, have your pharmacy contact our office and allow 72 hours for refills to be completed.    Today you had a 1/2 of a phlebotomy done per MD orders.   To help prevent nausea and vomiting after your treatment, we encourage you to take your nausea medication as directed.  BELOW ARE SYMPTOMS THAT SHOULD BE REPORTED IMMEDIATELY: *FEVER GREATER THAN 100.4 F (38 C) OR HIGHER *CHILLS OR SWEATING *NAUSEA AND VOMITING THAT IS NOT CONTROLLED WITH YOUR NAUSEA MEDICATION *UNUSUAL SHORTNESS OF BREATH *UNUSUAL BRUISING OR BLEEDING *URINARY PROBLEMS (pain or burning when urinating, or frequent urination) *BOWEL PROBLEMS (unusual diarrhea, constipation, pain near the anus) TENDERNESS IN MOUTH AND THROAT WITH OR WITHOUT PRESENCE OF ULCERS (sore throat, sores in mouth, or a toothache) UNUSUAL RASH, SWELLING OR PAIN  UNUSUAL VAGINAL DISCHARGE OR ITCHING   Items with * indicate a potential emergency and should be followed up as soon as possible or go to the Emergency Department if any  problems should occur.  Please show the CHEMOTHERAPY ALERT CARD or IMMUNOTHERAPY ALERT CARD at check-in to the Emergency Department and triage nurse.  Should you have questions after your visit or need to cancel or reschedule your appointment, please contact [facility] [phone]  and follow the prompts.  Office hours are 8:00 a.m. to 4:30 p.m. Monday - Friday. Please note that voicemails left after 4:00 p.m. may not be returned until the following business day.  We are closed weekends and major holidays. You have access to a nurse at all times for urgent questions. Please call the main number to the clinic [phone] and follow the prompts.  For any non-urgent questions, you may also contact your provider using MyChart. We now offer e-Visits for anyone 98 and older to request care online for non-urgent symptoms. For details visit [email or web address].   Also download the MyChart app! Go to the app store, search "MyChart", open the app, select [place], and log in with your MyChart username and password.

## 2023-03-15 ENCOUNTER — Inpatient Hospital Stay: Payer: 59

## 2023-03-15 LAB — CBC WITH DIFFERENTIAL/PLATELET
Abs Immature Granulocytes: 0.01 10*3/uL (ref 0.00–0.07)
Basophils Absolute: 0.1 10*3/uL (ref 0.0–0.1)
Basophils Relative: 1 %
Eosinophils Absolute: 0.1 10*3/uL (ref 0.0–0.5)
Eosinophils Relative: 2 %
HCT: 36 % — ABNORMAL LOW (ref 39.0–52.0)
Hemoglobin: 12 g/dL — ABNORMAL LOW (ref 13.0–17.0)
Immature Granulocytes: 0 %
Lymphocytes Relative: 46 %
Lymphs Abs: 2.8 10*3/uL (ref 0.7–4.0)
MCH: 33.3 pg (ref 26.0–34.0)
MCHC: 33.3 g/dL (ref 30.0–36.0)
MCV: 100 fL (ref 80.0–100.0)
Monocytes Absolute: 0.4 10*3/uL (ref 0.1–1.0)
Monocytes Relative: 6 %
Neutro Abs: 2.7 10*3/uL (ref 1.7–7.7)
Neutrophils Relative %: 45 %
Platelets: 166 10*3/uL (ref 150–400)
RBC: 3.6 MIL/uL — ABNORMAL LOW (ref 4.22–5.81)
RDW: 13.6 % (ref 11.5–15.5)
WBC: 6.1 10*3/uL (ref 4.0–10.5)
nRBC: 0 % (ref 0.0–0.2)

## 2023-03-15 LAB — FERRITIN: Ferritin: 1246 ng/mL — ABNORMAL HIGH (ref 24–336)

## 2023-03-15 LAB — IRON AND TIBC
Iron: 177 ug/dL (ref 45–182)
Saturation Ratios: 87 % — ABNORMAL HIGH (ref 17.9–39.5)
TIBC: 204 ug/dL — ABNORMAL LOW (ref 250–450)
UIBC: 27 ug/dL

## 2023-03-15 NOTE — Patient Instructions (Signed)

## 2023-03-15 NOTE — Progress Notes (Signed)
Patient presents today for therapeutic phlebotomy.  Patient is in satisfactory condition with no new complaints voiced.  Vital signs are stable.  We will proceed with phlebotomy per provider orders.  Therapeutic phlebotomy started at 1437 and ended at 1443.  500 mL of blood removed from R arm.  Vital signs remained stable.  Patient denies dizziness or lightheadedness.  Coban and gauze applied to site. Patient waited the recommended 30 minute post phlebotomy wait time.  Patient left ambulatory in stable condition.

## 2023-03-16 ENCOUNTER — Telehealth: Payer: Self-pay | Admitting: *Deleted

## 2023-03-16 ENCOUNTER — Encounter: Payer: Self-pay | Admitting: Physician Assistant

## 2023-03-16 DIAGNOSIS — I7121 Aneurysm of the ascending aorta, without rupture: Secondary | ICD-10-CM

## 2023-03-16 NOTE — Telephone Encounter (Signed)
-----   Message from Rollene Rotunda sent at 03/14/2023  8:43 AM EDT ----- The patient is aorta is 42 mm.  This seems to be unchanged from previous.  Follow-up with another CT angiogram in 1 year.  Call Mr. Sutherland with the results and send results to Elfredia Nevins, MD.  He will need follow-up with a new cardiologist.

## 2023-03-16 NOTE — Telephone Encounter (Signed)
Patient notified.  Appointment scheduled with Dr Antoine Poche for June 09, 2023

## 2023-03-22 ENCOUNTER — Other Ambulatory Visit: Payer: Self-pay

## 2023-03-22 NOTE — Progress Notes (Unsigned)
[facility] [address], [place] [address]   CLINIC:  Medical Oncology/Hematology  PCP:  Elfredia Nevins, MD [address] [place] [address] [phone]   REASON FOR VISIT:  Follow-up for hereditary hemochromatosis (C282Y homozygous)  CURRENT THERAPY: Therapeutic phlebotomy  INTERVAL HISTORY:   Howard [name] 57 y.o. male returns for routine follow-up of hereditary hemochromatosis.  He was last seen on 12/08/2022 by Rojelio Brenner PA-C  At today's visit, he reports feeling fairly well.  He has been receiving weekly phlebotomy since January 2024.  He has been tolerating phlebotomy well, and is not feeling as tired or having headaches as often the day after phlebotomy.  He has ongoing back pain related to his scoliosis, but his other joint pain has improved.   He also notes improved mental clarity since starting phlebotomy.   He has some right lower quadrant abdominal pain, that seems to be worse after eating (following with GI).   He denies any evidence of active bleeding.  He has 50% energy and 100% appetite. He endorses that he is maintaining a stable weight.  ASSESSMENT & PLAN:  1.  Hereditary hemochromatosis, C282Y homozygous - Seen at the request of Dr. Marletta Lor (gastroenterology) for further evaluation and management of hemochromatosis, which was discovered during workup of transaminitis. Iron panel (04/07/2022) the with ferritin 2828, iron saturation 95%. Hemochromatosis DNA testing revealed C282Y homozygosity (04/19/2022) MRI abdomen (05/24/2022) consistent with severe diffuse hepatic iron deposition disease, without evidence of iron deposition within the spleen, pancreas, or bone marrow. - No family history of hemochromatosis. - Symptomatic iron overload with fatigue and arthralgia.  Symptoms have improved after starting phlebotomy.   - Weekly phlebotomy started in January 2024.  Tolerating phlebotomy fairly well. - Most recent labs  (03/15/2023): Hgb 12.3/MCV 100, ferritin 1246 (trending upward since last visit), iron sats 87% - Have discussed with patient the natural course of untreated hereditary hemochromatosis with risk of cirrhosis, hepatocellular carcinoma, heart failure, and arrhythmia.  Iron overload also increases risk of certain bacterial infections. - We have discussed lifestyle modifications such as avoiding alcohol and iron supplementation.  Recent guidelines do not find that moderate red meat intake adversely affects patients with hemochromatosis.  Patient should avoid raw fish or undercooked meat due to risk of certain bacterial infections to thrive and iron rich environments. - We have discussed phlebotomy protocol for treatment of hemochromatosis: Weekly phlebotomy as tolerated until goal ferritin is reached. If Hgb drops < 11.0, we will decrease frequency of phlebotomy. If patient experiences severe side effects or hemodynamic instability, we will decrease amount taken for phlebotomy. Goal is ferritin < 50 for normalization of iron stores. Once iron stores have normalized, we will decrease frequency of phlebotomy. PLAN: Continue weekly CBC + phlebotomy (as long as Hgb >11.0)  - Labs and RTC in 3 months   2.  Mild macrocytic anemia - Noted to have mild normocytic anemia (prior to starting phlebotomy), with Hgb 12.5/MCV 97.7 on 06/07/2022 - Macrocytosis was not present until after patient started receiving regular phlebotomies - Anemia workup (07/15/2022): Normal B12 at 548, normal MMA.  Normal folate. CMP shows normal kidney function. SPEP, immunofixation, light chains negative for any monoclonal gammopathy - Reticulocytes somewhat low for degree of anemia at 0.7%  - Currently receiving weekly phlebotomy for hereditary hemochromatosis, denies any other sources of blood loss.   - PLAN: We will continue to monitor and consider additional testing if any severe worsening of anemia out of proportion to  regular  phlebotomies.  3.   Tobacco use - Current active smoker, 1 PPD since age 28 - Receives regular CT scans via cardiothoracic/cardiology for monitoring of bicuspid aorta and thoracic aortic aneurysm - CTA chest (12/24/2021) negative for any signs of pulmonary malignancy - PLAN: We discussed importance of smoking cessation and will continue to follow-up on this at future visits. - Patient qualifies for LDCT scan, but this is unnecessary as he is currently receiving regular cardiac/chest imaging as above.  4.  Other history - PMH: Psoriatic arthritis (on chronic methotrexate), fatty liver disease, history of alcoholism, GERD, chronic pain, bicuspid aorta with thoracic aortic aneurysm, anxiety/depression, osteoarthritis.  Last colonoscopy was in 2021, with recommendations for repeat colonoscopy in 10 years.  - SOCIAL: Lives at home with his wife. He is currently on disability due to his psoriatic arthritis, but previously worked in [employer]. He has a history of alcoholism (drank heavily from age 64 to age 22), but has been sober since 2019. He also reports history of illicit drug use (cocaine, marijuana, opioid pills, fentanyl), but has been clean for the past 4 years. He is a current active smoker, 1 PPD since age 73.  - FAMILY: No family history of hemochromatosis, blood problems, cancer  PLAN SUMMARY: >> Weekly CBC + phlebotomy (as long as Hgb >11) >> Labs in 3 months = CBC/D, CMP, ferritin, iron/TIBC  >> OFFICE visit in 3 months     REVIEW OF SYSTEMS:   Review of Systems  Constitutional:  Negative for appetite change, chills, diaphoresis, fatigue, fever and unexpected weight change.  HENT:   Negative for lump/mass and nosebleeds.   Eyes:  Negative for eye problems.  Respiratory:  Negative for cough, hemoptysis and shortness of breath.   Cardiovascular:  Negative for chest pain, leg swelling and palpitations.  Gastrointestinal:  Positive for abdominal pain. Negative for blood in stool,  constipation, diarrhea, nausea and vomiting.  Genitourinary:  Negative for hematuria.   Musculoskeletal:  Positive for arthralgias and back pain.  Skin: Negative.   Neurological:  Positive for headaches. Negative for dizziness, light-headedness and numbness.  Hematological:  Does not bruise/bleed easily.     PHYSICAL EXAM:  ECOG PERFORMANCE STATUS: 1 - Symptomatic but completely ambulatory  Vitals:   03/23/23 1337  BP: 94/64  Pulse: 66  Resp: 16  Temp: (!) 97.5 F (36.4 C)  SpO2: 100%    Filed Weights   03/23/23 1337  Weight: 158 lb 3.2 oz (71.8 kg)    Physical Exam Constitutional:      Appearance: Normal appearance. He is normal weight.  Cardiovascular:     Heart sounds: Normal heart sounds.  Pulmonary:     Breath sounds: Normal breath sounds.  Musculoskeletal:     Thoracic back: Scoliosis present.  Neurological:     General: No focal deficit present.     Mental Status: Mental status is at baseline.  Psychiatric:        Behavior: Behavior normal. Behavior is cooperative.    PAST MEDICAL/SURGICAL HISTORY:  Past Medical History:  Diagnosis Date   Alcohol abuse    Anxiety    Chronic back pain    Chronic pain    Depression    Fall    Fracture of right elbow    Hereditary hemochromatosis (HCC) 06/01/2022   Osteoarthritis    Psoriasis    Past Surgical History:  Procedure Laterality Date   APPENDECTOMY     COLONOSCOPY WITH PROPOFOL N/A 06/07/2019   diverticulosis, otherwise  normal. Non-bleeding internal hemorrhoids.    FOOT SURGERY     per patient: "neurofibroma excision"   LUMBAR LAMINECTOMY/DECOMPRESSION MICRODISCECTOMY Bilateral 12/31/2015   Procedure: LUMBER DECOMPRESSION L4-5/ L5-S1 BILATERALLY  ;  Surgeon: Jene Every, MD;  Location: [facility];  Service: Orthopedics;  Laterality: Bilateral;   REVERSE SHOULDER ARTHROPLASTY Right 10/24/2018   REVERSE SHOULDER ARTHROPLASTY Right 10/24/2018   Procedure: REVERSE SHOULDER ARTHROPLASTY;  Surgeon: Yolonda Kida, MD;  Location: [facility];  Service: Orthopedics;  Laterality: Right;   SHOULDER SURGERY Right    TOTAL HIP ARTHROPLASTY Right    TOTAL HIP ARTHROPLASTY Left 08/27/2014   Procedure: LEFT TOTAL HIP ARTHROPLASTY ANTERIOR APPROACH;  Surgeon: Durene Romans, MD;  Location: [facility];  Service: Orthopedics;  Laterality: Left;   XI ROBOTIC ASSISTED INGUINAL HERNIA REPAIR WITH MESH Right 05/12/2022   Procedure: XI ROBOTIC ASSISTED INGUINAL HERNIA REPAIR WITH MESH, RIGHT;  Surgeon: Franky Macho, MD;  Location: [facility];  Service: General;  Laterality: Right;    SOCIAL HISTORY:  Social History   Socioeconomic History   Marital status: Married    Spouse name: Not on file   Number of children: Not on file   Years of education: Not on file   Highest education level: Not on file  Occupational History   Not on file  Tobacco Use   Smoking status: Every Day    Current packs/day: 1.00    Average packs/day: 1 pack/day for 32.0 years (32.0 ttl pk-yrs)    Types: Cigarettes    Passive exposure: Current   Smokeless tobacco: Never  Vaping Use   Vaping status: Never Used  Substance and Sexual Activity   Alcohol use: Not Currently    Alcohol/week: 16.0 standard drinks of alcohol    Types: 6 Glasses of wine, 10 Cans of beer per week    Comment: None currently (last ETOH 03/01/2018); previously drinks 3 to 4 beers daily and 1 glass of wine; "way more" on the weekend   Drug use: Yes    Types: Marijuana    Comment: hx of marijuana use years ago    Sexual activity: Not Currently  Other Topics Concern   Not on file  Social History Narrative   Not on file   Social Determinants of Health   Financial Resource Strain: Not on file  Food Insecurity: No Food Insecurity (06/01/2022)   Hunger Vital Sign    Worried About Running Out of Food in the Last Year: Never true    Ran Out of Food in the Last Year: Never true  Transportation Needs: No Transportation Needs (06/01/2022)   PRAPARE - Therapist, art (Medical): No    Lack of Transportation (Non-Medical): No  Physical Activity: Not on file  Stress: Not on file  Social Connections: Not on file  Intimate Partner Violence: Not At Risk (06/01/2022)   Humiliation, Afraid, Rape, and Kick questionnaire    Fear of Current or Ex-Partner: No    Emotionally Abused: No    Physically Abused: No    Sexually Abused: No    FAMILY HISTORY:  Family History  Problem Relation Age of Onset   Heart attack Mother    Colon cancer Neg Hx     CURRENT MEDICATIONS:  Outpatient Encounter Medications as of 03/23/2023  Medication Sig   ALPRAZolam (XANAX) 0.5 MG tablet Take 0.5 mg by mouth 2 (two) times daily as needed for anxiety.   amphetamine-dextroamphetamine (ADDERALL) 20 MG tablet Take 20 mg by  mouth 3 (three) times daily.   Buprenorphine HCl-Naloxone HCl 8-2 MG FILM Place under the tongue.   folic acid (FOLVITE) 1 MG tablet Take 1 mg by mouth daily.   Golimumab (SIMPONI ARIA IV) Inject 1 Dose into the vein every 8 (eight) weeks.   levothyroxine (SYNTHROID, LEVOTHROID) 25 MCG tablet Take 25 mcg by mouth daily before breakfast.    Methotrexate Sodium (METHOTREXATE, PF,) 50 MG/2ML injection Inject 0.7 mLs into the muscle once a week.   omeprazole (PRILOSEC) 20 MG capsule Take 20 mg by mouth daily as needed (acid reflux).   predniSONE (DELTASONE) 5 MG tablet Take 5-10 mg by mouth daily as needed (arthritis).   rosuvastatin (CRESTOR) 5 MG tablet TAKE 1 TABLET (5 MG TOTAL) BY MOUTH DAILY.   tamsulosin (FLOMAX) 0.4 MG CAPS capsule Take 0.4 mg by mouth daily.   No facility-administered encounter medications on file as of 03/23/2023.    ALLERGIES:  No Known Allergies  LABORATORY DATA:  I have reviewed the labs as listed.  CBC    Component Value Date/Time   WBC 5.7 03/23/2023 1250   RBC 3.58 (L) 03/23/2023 1250   HGB 12.1 (L) 03/23/2023 1250   HCT 35.4 (L) 03/23/2023 1250   PLT 185 03/23/2023 1250   MCV 98.9 03/23/2023 1250    MCH 33.8 03/23/2023 1250   MCHC 34.2 03/23/2023 1250   RDW 13.1 03/23/2023 1250   LYMPHSABS 2.8 03/23/2023 1250   MONOABS 0.6 03/23/2023 1250   EOSABS 0.0 03/23/2023 1250   BASOSABS 0.1 03/23/2023 1250      Latest Ref Rng & Units 02/16/2023    8:27 AM 07/15/2022   10:52 AM 06/02/2022    9:06 AM  CMP  Glucose 70 - 99 mg/dL  244    BUN 6 - 20 mg/dL  17    Creatinine 0.10 - 1.24 mg/dL  2.72    Sodium 536 - 644 mmol/L  137    Potassium 3.5 - 5.1 mmol/L  4.1    Chloride 98 - 111 mmol/L  103    CO2 22 - 32 mmol/L  28    Calcium 8.9 - 10.3 mg/dL  8.6    Total Protein 6.1 - 8.1 g/dL 5.3  5.8  5.6   Total Bilirubin 0.2 - 1.2 mg/dL 0.6  0.5  0.3   Alkaline Phos 38 - 126 U/L  92  106   AST 10 - 35 U/L 56  34  43   ALT 9 - 46 U/L 52  38  42     DIAGNOSTIC IMAGING:  I have independently reviewed the relevant imaging and discussed with the patient.   WRAP UP:  All questions were answered. The patient knows to call the clinic with any problems, questions or concerns.  Medical decision making: Low  Time spent on visit: I spent 15 minutes counseling the patient face to face. The total time spent in the appointment was 22 minutes and more than 50% was on counseling.  Carnella Guadalajara, PA-C  03/23/23 2:05 PM

## 2023-03-23 ENCOUNTER — Inpatient Hospital Stay: Payer: 59 | Attending: Hematology | Admitting: Physician Assistant

## 2023-03-23 ENCOUNTER — Inpatient Hospital Stay: Payer: 59

## 2023-03-23 LAB — CBC WITH DIFFERENTIAL/PLATELET
Abs Immature Granulocytes: 0.01 10*3/uL (ref 0.00–0.07)
Basophils Absolute: 0.1 10*3/uL (ref 0.0–0.1)
Basophils Relative: 1 %
Eosinophils Absolute: 0 10*3/uL (ref 0.0–0.5)
Eosinophils Relative: 0 %
HCT: 35.4 % — ABNORMAL LOW (ref 39.0–52.0)
Hemoglobin: 12.1 g/dL — ABNORMAL LOW (ref 13.0–17.0)
Immature Granulocytes: 0 %
Lymphocytes Relative: 49 %
Lymphs Abs: 2.8 10*3/uL (ref 0.7–4.0)
MCH: 33.8 pg (ref 26.0–34.0)
MCHC: 34.2 g/dL (ref 30.0–36.0)
MCV: 98.9 fL (ref 80.0–100.0)
Monocytes Absolute: 0.6 10*3/uL (ref 0.1–1.0)
Monocytes Relative: 10 %
Neutro Abs: 2.3 10*3/uL (ref 1.7–7.7)
Neutrophils Relative %: 40 %
Platelets: 185 10*3/uL (ref 150–400)
RBC: 3.58 MIL/uL — ABNORMAL LOW (ref 4.22–5.81)
RDW: 13.1 % (ref 11.5–15.5)
WBC: 5.7 10*3/uL (ref 4.0–10.5)
nRBC: 0 % (ref 0.0–0.2)

## 2023-03-23 NOTE — Patient Instructions (Signed)
[facility] - A DEPT OF [facility]  Discharge Instructions: Thank you for choosing [facility] to provide your oncology and hematology care.  If you have a lab appointment with the Cancer Center - please note that after April 8th, 2024, all labs will be drawn in the cancer center.  You do not have to check in or register with the main entrance as you have in the past but will complete your check-in in the cancer center.  Wear comfortable clothing and clothing appropriate for easy access to any Portacath or PICC line.   We strive to give you quality time with your provider. You may need to reschedule your appointment if you arrive late (15 or more minutes).  Arriving late affects you and other patients whose appointments are after yours.  Also, if you miss three or more appointments without notifying the office, you may be dismissed from the clinic at the provider's discretion.      For prescription refill requests, have your pharmacy contact our office and allow 72 hours for refills to be completed.    Today you received the following chemotherapy and/or immunotherapy agents Phlebotomy      To help prevent nausea and vomiting after your treatment, we encourage you to take your nausea medication as directed.  BELOW ARE SYMPTOMS THAT SHOULD BE REPORTED IMMEDIATELY: *FEVER GREATER THAN 100.4 F (38 C) OR HIGHER *CHILLS OR SWEATING *NAUSEA AND VOMITING THAT IS NOT CONTROLLED WITH YOUR NAUSEA MEDICATION *UNUSUAL SHORTNESS OF BREATH *UNUSUAL BRUISING OR BLEEDING *URINARY PROBLEMS (pain or burning when urinating, or frequent urination) *BOWEL PROBLEMS (unusual diarrhea, constipation, pain near the anus) TENDERNESS IN MOUTH AND THROAT WITH OR WITHOUT PRESENCE OF ULCERS (sore throat, sores in mouth, or a toothache) UNUSUAL RASH, SWELLING OR PAIN  UNUSUAL VAGINAL DISCHARGE OR ITCHING   Items with * indicate a potential emergency and should be followed  up as soon as possible or go to the Emergency Department if any problems should occur.  Please show the CHEMOTHERAPY ALERT CARD or IMMUNOTHERAPY ALERT CARD at check-in to the Emergency Department and triage nurse.  Should you have questions after your visit or need to cancel or reschedule your appointment, please contact [facility] - A DEPT OF [facility] [phone]  and follow the prompts.  Office hours are 8:00 a.m. to 4:30 p.m. Monday - Friday. Please note that voicemails left after 4:00 p.m. may not be returned until the following business day.  We are closed weekends and major holidays. You have access to a nurse at all times for urgent questions. Please call the main number to the clinic [phone] and follow the prompts.  For any non-urgent questions, you may also contact your provider using MyChart. We now offer e-Visits for anyone 43 and older to request care online for non-urgent symptoms. For details visit [email or web address].   Also download the MyChart app! Go to the app store, search "MyChart", open the app, select [address], and log in with your MyChart username and password.

## 2023-03-23 NOTE — Progress Notes (Signed)
Patient presents today for phlebotomy per MD orders. Phlebotomy procedure started at 1430 and ended at 1438. 500 cc removed. Patient tolerated procedure well. Procedure tolerated well and without incident. Discharged ambulatory in stable condition.

## 2023-03-29 ENCOUNTER — Other Ambulatory Visit: Payer: Self-pay

## 2023-03-29 NOTE — Progress Notes (Signed)
Lab orders entered

## 2023-03-30 ENCOUNTER — Inpatient Hospital Stay: Payer: 59

## 2023-03-30 LAB — CBC WITH DIFFERENTIAL/PLATELET
Abs Immature Granulocytes: 0.01 10*3/uL (ref 0.00–0.07)
Basophils Absolute: 0 10*3/uL (ref 0.0–0.1)
Basophils Relative: 1 %
Eosinophils Absolute: 0.1 10*3/uL (ref 0.0–0.5)
Eosinophils Relative: 1 %
HCT: 33.8 % — ABNORMAL LOW (ref 39.0–52.0)
Hemoglobin: 11.6 g/dL — ABNORMAL LOW (ref 13.0–17.0)
Immature Granulocytes: 0 %
Lymphocytes Relative: 46 %
Lymphs Abs: 2.4 10*3/uL (ref 0.7–4.0)
MCH: 34.1 pg — ABNORMAL HIGH (ref 26.0–34.0)
MCHC: 34.3 g/dL (ref 30.0–36.0)
MCV: 99.4 fL (ref 80.0–100.0)
Monocytes Absolute: 0.4 10*3/uL (ref 0.1–1.0)
Monocytes Relative: 7 %
Neutro Abs: 2.4 10*3/uL (ref 1.7–7.7)
Neutrophils Relative %: 45 %
Platelets: 168 10*3/uL (ref 150–400)
RBC: 3.4 MIL/uL — ABNORMAL LOW (ref 4.22–5.81)
RDW: 13.4 % (ref 11.5–15.5)
WBC: 5.3 10*3/uL (ref 4.0–10.5)
nRBC: 0 % (ref 0.0–0.2)

## 2023-03-30 NOTE — Patient Instructions (Signed)
 [facility] - A DEPT OF [facility]  Discharge Instructions: Thank you for choosing [facility] to provide your oncology and hematology care.  If you have a lab appointment with the Cancer Center - please note that after April 8th, 2024, all labs will be drawn in the cancer center.  You do not have to check in or register with the main entrance as you have in the past but will complete your check-in in the cancer center.  Wear comfortable clothing and clothing appropriate for easy access to any Portacath or PICC line.   We strive to give you quality time with your provider. You may need to reschedule your appointment if you arrive late (15 or more minutes).  Arriving late affects you and other patients whose appointments are after yours.  Also, if you miss three or more appointments without notifying the office, you may be dismissed from the clinic at the provider's discretion.      For prescription refill requests, have your pharmacy contact our office and allow 72 hours for refills to be completed.    Today you received the following chemotherapy and/or immunotherapy agents Phlebotomy      To help prevent nausea and vomiting after your treatment, we encourage you to take your nausea medication as directed.  BELOW ARE SYMPTOMS THAT SHOULD BE REPORTED IMMEDIATELY: *FEVER GREATER THAN 100.4 F (38 C) OR HIGHER *CHILLS OR SWEATING *NAUSEA AND VOMITING THAT IS NOT CONTROLLED WITH YOUR NAUSEA MEDICATION *UNUSUAL SHORTNESS OF BREATH *UNUSUAL BRUISING OR BLEEDING *URINARY PROBLEMS (pain or burning when urinating, or frequent urination) *BOWEL PROBLEMS (unusual diarrhea, constipation, pain near the anus) TENDERNESS IN MOUTH AND THROAT WITH OR WITHOUT PRESENCE OF ULCERS (sore throat, sores in mouth, or a toothache) UNUSUAL RASH, SWELLING OR PAIN  UNUSUAL VAGINAL DISCHARGE OR ITCHING   Items with * indicate a potential emergency and should be followed  up as soon as possible or go to the Emergency Department if any problems should occur.  Please show the CHEMOTHERAPY ALERT CARD or IMMUNOTHERAPY ALERT CARD at check-in to the Emergency Department and triage nurse.  Should you have questions after your visit or need to cancel or reschedule your appointment, please contact [facility] - A DEPT OF [facility] [phone]  and follow the prompts.  Office hours are 8:00 a.m. to 4:30 p.m. Monday - Friday. Please note that voicemails left after 4:00 p.m. may not be returned until the following business day.  We are closed weekends and major holidays. You have access to a nurse at all times for urgent questions. Please call the main number to the clinic [phone] and follow the prompts.  For any non-urgent questions, you may also contact your provider using MyChart. We now offer e-Visits for anyone 43 and older to request care online for non-urgent symptoms. For details visit [email or web address].   Also download the MyChart app! Go to the app store, search "MyChart", open the app, select [address], and log in with your MyChart username and password.

## 2023-03-30 NOTE — Progress Notes (Signed)
Howard [name] presents today for phlebotomy per MD orders. Phlebotomy procedure started at 1455 and ended at 1459. 101 grams removed. Patient observed for 20 minutes after procedure without any incident. Patient tolerated procedure well. IV needle removed intact.

## 2023-04-06 ENCOUNTER — Inpatient Hospital Stay: Payer: 59

## 2023-04-06 LAB — CBC
HCT: 36.1 % — ABNORMAL LOW (ref 39.0–52.0)
Hemoglobin: 12.5 g/dL — ABNORMAL LOW (ref 13.0–17.0)
MCH: 34.3 pg — ABNORMAL HIGH (ref 26.0–34.0)
MCHC: 34.6 g/dL (ref 30.0–36.0)
MCV: 99.2 fL (ref 80.0–100.0)
Platelets: 199 10*3/uL (ref 150–400)
RBC: 3.64 MIL/uL — ABNORMAL LOW (ref 4.22–5.81)
RDW: 13.4 % (ref 11.5–15.5)
WBC: 5.8 10*3/uL (ref 4.0–10.5)
nRBC: 0 % (ref 0.0–0.2)

## 2023-04-06 NOTE — Progress Notes (Signed)
Patient presents today for phlebotomy per MD orders. Phlebotomy procedure started at 1454 and ended at 1500. 500 cc removed. Patient tolerated procedure well. Procedure tolerated well and without incident. Discharged ambulatory in stable condition.

## 2023-04-06 NOTE — Patient Instructions (Signed)
 [facility] - A DEPT OF [facility]  Discharge Instructions: Thank you for choosing [facility] to provide your oncology and hematology care.  If you have a lab appointment with the Cancer Center - please note that after April 8th, 2024, all labs will be drawn in the cancer center.  You do not have to check in or register with the main entrance as you have in the past but will complete your check-in in the cancer center.  Wear comfortable clothing and clothing appropriate for easy access to any Portacath or PICC line.   We strive to give you quality time with your provider. You may need to reschedule your appointment if you arrive late (15 or more minutes).  Arriving late affects you and other patients whose appointments are after yours.  Also, if you miss three or more appointments without notifying the office, you may be dismissed from the clinic at the provider's discretion.      For prescription refill requests, have your pharmacy contact our office and allow 72 hours for refills to be completed.    Today you received the following chemotherapy and/or immunotherapy agents Phlebotomy      To help prevent nausea and vomiting after your treatment, we encourage you to take your nausea medication as directed.  BELOW ARE SYMPTOMS THAT SHOULD BE REPORTED IMMEDIATELY: *FEVER GREATER THAN 100.4 F (38 C) OR HIGHER *CHILLS OR SWEATING *NAUSEA AND VOMITING THAT IS NOT CONTROLLED WITH YOUR NAUSEA MEDICATION *UNUSUAL SHORTNESS OF BREATH *UNUSUAL BRUISING OR BLEEDING *URINARY PROBLEMS (pain or burning when urinating, or frequent urination) *BOWEL PROBLEMS (unusual diarrhea, constipation, pain near the anus) TENDERNESS IN MOUTH AND THROAT WITH OR WITHOUT PRESENCE OF ULCERS (sore throat, sores in mouth, or a toothache) UNUSUAL RASH, SWELLING OR PAIN  UNUSUAL VAGINAL DISCHARGE OR ITCHING   Items with * indicate a potential emergency and should be followed  up as soon as possible or go to the Emergency Department if any problems should occur.  Please show the CHEMOTHERAPY ALERT CARD or IMMUNOTHERAPY ALERT CARD at check-in to the Emergency Department and triage nurse.  Should you have questions after your visit or need to cancel or reschedule your appointment, please contact [facility] - A DEPT OF [facility] [phone]  and follow the prompts.  Office hours are 8:00 a.m. to 4:30 p.m. Monday - Friday. Please note that voicemails left after 4:00 p.m. may not be returned until the following business day.  We are closed weekends and major holidays. You have access to a nurse at all times for urgent questions. Please call the main number to the clinic [phone] and follow the prompts.  For any non-urgent questions, you may also contact your provider using MyChart. We now offer e-Visits for anyone 43 and older to request care online for non-urgent symptoms. For details visit [email or web address].   Also download the MyChart app! Go to the app store, search "MyChart", open the app, select [address], and log in with your MyChart username and password.

## 2023-04-12 ENCOUNTER — Other Ambulatory Visit: Payer: Self-pay

## 2023-04-13 ENCOUNTER — Inpatient Hospital Stay: Payer: 59

## 2023-04-13 LAB — CBC WITH DIFFERENTIAL/PLATELET
Abs Immature Granulocytes: 0.02 10*3/uL (ref 0.00–0.07)
Basophils Absolute: 0.1 10*3/uL (ref 0.0–0.1)
Basophils Relative: 1 %
Eosinophils Absolute: 0 10*3/uL (ref 0.0–0.5)
Eosinophils Relative: 0 %
HCT: 35.7 % — ABNORMAL LOW (ref 39.0–52.0)
Hemoglobin: 12 g/dL — ABNORMAL LOW (ref 13.0–17.0)
Immature Granulocytes: 0 %
Lymphocytes Relative: 43 %
Lymphs Abs: 3.4 10*3/uL (ref 0.7–4.0)
MCH: 33 pg (ref 26.0–34.0)
MCHC: 33.6 g/dL (ref 30.0–36.0)
MCV: 98.1 fL (ref 80.0–100.0)
Monocytes Absolute: 0.7 10*3/uL (ref 0.1–1.0)
Monocytes Relative: 8 %
Neutro Abs: 3.7 10*3/uL (ref 1.7–7.7)
Neutrophils Relative %: 48 %
Platelets: 199 10*3/uL (ref 150–400)
RBC: 3.64 MIL/uL — ABNORMAL LOW (ref 4.22–5.81)
RDW: 13.4 % (ref 11.5–15.5)
WBC: 7.8 10*3/uL (ref 4.0–10.5)
nRBC: 0 % (ref 0.0–0.2)

## 2023-04-13 NOTE — Progress Notes (Signed)
Howard [name] presents today for phlebotomy per MD orders. Hgb 12 and HCT 35.7 today.  Phlebotomy procedure started at 1348 and ended at 1356. 500cc removed per orders. Patient tolerated procedure well. IV needle removed intact. VSS. Pt stable at discharge.  [name]

## 2023-04-13 NOTE — Patient Instructions (Signed)

## 2023-04-20 ENCOUNTER — Inpatient Hospital Stay: Payer: 59

## 2023-04-26 ENCOUNTER — Other Ambulatory Visit: Payer: Self-pay

## 2023-04-27 ENCOUNTER — Inpatient Hospital Stay: Payer: 59

## 2023-04-27 ENCOUNTER — Inpatient Hospital Stay: Payer: 59 | Attending: Hematology

## 2023-04-27 LAB — CBC
HCT: 36.4 % — ABNORMAL LOW (ref 39.0–52.0)
Hemoglobin: 12.1 g/dL — ABNORMAL LOW (ref 13.0–17.0)
MCH: 33.1 pg (ref 26.0–34.0)
MCHC: 33.2 g/dL (ref 30.0–36.0)
MCV: 99.5 fL (ref 80.0–100.0)
Platelets: 176 10*3/uL (ref 150–400)
RBC: 3.66 MIL/uL — ABNORMAL LOW (ref 4.22–5.81)
RDW: 13.2 % (ref 11.5–15.5)
WBC: 5.6 10*3/uL (ref 4.0–10.5)
nRBC: 0 % (ref 0.0–0.2)

## 2023-04-27 NOTE — Progress Notes (Signed)
Howard [name] presents for therapeutic phlebotomy per MD orders. Last HGB 12.1 / HCT 36.4 on 04-27-2023 . Vital signs stable prior to procedure. Procedure started at 14:58 pm and ended at 15:16 pm.. 500 mls of blood removed. Patient denies any dizziness , lightheadedness, or feeling faint.  Gauze and coban applied to site. Vital signs stable at completion of procedure. Patient has no complaints at this time. Alert and oriented x 3. Discharged in stable condition.

## 2023-04-27 NOTE — Patient Instructions (Signed)
[facility] [place] - A DEPT OF [facility]  Discharge Instructions: Thank you for choosing [facility] to provide your oncology and hematology care.  If you have a lab appointment with the Cancer Center - please note that after April 8th, 2024, all labs will be drawn in the cancer center.  You do not have to check in or register with the main entrance as you have in the past but will complete your check-in in the cancer center.  Wear comfortable clothing and clothing appropriate for easy access to any Portacath or PICC line.   We strive to give you quality time with your provider. You may need to reschedule your appointment if you arrive late (15 or more minutes).  Arriving late affects you and other patients whose appointments are after yours.  Also, if you miss three or more appointments without notifying the office, you may be dismissed from the clinic at the provider's discretion.      For prescription refill requests, have your pharmacy contact our office and allow 72 hours for refills to be completed.    Today you received the following chemotherapy and/or immunotherapy agents phlebotomy. Therapeutic Phlebotomy Therapeutic phlebotomy is the planned removal of blood from a person's body for the purpose of treating a medical condition. The procedure is lot like donating blood. Usually, about a pint (470 mL, or 0.47 L) of blood is removed. The average adult has 9-12 pints (4.3-5.7 L) of blood in his or her body. Therapeutic phlebotomy may be used to treat the following medical conditions: Hemochromatosis. This is a condition in which the blood contains too much iron. Polycythemia vera. This is a condition in which the blood contains too many red blood cells. Porphyria cutanea tarda. This is a disease in which an important part of hemoglobin is not made properly. It results in the buildup of abnormal amounts of porphyrins in the body. Sickle cell disease.  This is a condition in which the red blood cells form an abnormal crescent shape rather than a round shape. Tell a health care provider about: Any allergies you have. All medicines you are taking, including vitamins, herbs, eye drops, creams, and over-the-counter medicines. Any bleeding problems you have. Any surgeries you have had. Any medical conditions you have. Whether you are pregnant or may be pregnant. What are the risks? Generally, this is a safe procedure. However, problems may occur, including: Nausea or light-headedness. Low blood pressure (hypotension). Soreness, bleeding, swelling, or bruising at the needle insertion site. Infection. What happens before the procedure? Ask your health care provider about: Changing or stopping your regular medicines. This is especially important if you are taking diabetes medicines or blood thinners. Taking medicines such as aspirin and ibuprofen. These medicines can thin your blood. Do not take these medicines unless your health care provider tells you to take them. Taking over-the-counter medicines, vitamins, herbs, and supplements. Wear clothing with sleeves that can be raised above the elbow. You may have a blood sample taken. Your blood pressure, pulse rate, and breathing rate will be measured. What happens during the procedure?  You may be given a medicine to numb the area (local anesthetic). A tourniquet will be placed on your arm. A needle will be put into one of your veins. Tubing and a collection bag will be attached to the needle. Blood will flow through the needle and tubing into the collection bag. The collection bag will be placed lower than your  arm so gravity can help the blood flow into the bag. You may be asked to open and close your hand slowly and continually during the entire collection. After the specified amount of blood has been removed from your body, the collection bag and tubing will be clamped. The needle will be  removed from your vein. Pressure will be held on the needle site to stop the bleeding. A bandage (dressing) will be placed over the needle insertion site. The procedure may vary among health care providers and hospitals. What happens after the procedure? Your blood pressure, pulse rate, and breathing rate will be measured after the procedure. You will be encouraged to drink fluids. You will be encouraged to eat a snack to prevent a low blood sugar level. Your recovery will be assessed and monitored. Return to your normal activities as told by your health care provider. Summary Therapeutic phlebotomy is the planned removal of blood from a person's body for the purpose of treating a medical condition. Therapeutic phlebotomy may be used to treat hemochromatosis, polycythemia vera, porphyria cutanea tarda, or sickle cell disease. In the procedure, a needle is inserted and about a pint (470 mL, or 0.47 L) of blood is removed. The average adult has 9-12 pints (4.3-5.7 L) of blood in the body. This is generally a safe procedure, but it can sometimes cause problems such as nausea, light-headedness, or low blood pressure (hypotension). This information is not intended to replace advice given to you by your health care provider. Make sure you discuss any questions you have with your health care provider. Document Revised: 10/29/2020 Document Reviewed: 10/29/2020 Elsevier Patient Education  2024 Elsevier Inc.       To help prevent nausea and vomiting after your treatment, we encourage you to take your nausea medication as directed.  BELOW ARE SYMPTOMS THAT SHOULD BE REPORTED IMMEDIATELY: *FEVER GREATER THAN 100.4 F (38 C) OR HIGHER *CHILLS OR SWEATING *NAUSEA AND VOMITING THAT IS NOT CONTROLLED WITH YOUR NAUSEA MEDICATION *UNUSUAL SHORTNESS OF BREATH *UNUSUAL BRUISING OR BLEEDING *URINARY PROBLEMS (pain or burning when urinating, or frequent urination) *BOWEL PROBLEMS (unusual diarrhea,  constipation, pain near the anus) TENDERNESS IN MOUTH AND THROAT WITH OR WITHOUT PRESENCE OF ULCERS (sore throat, sores in mouth, or a toothache) UNUSUAL RASH, SWELLING OR PAIN  UNUSUAL VAGINAL DISCHARGE OR ITCHING   Items with * indicate a potential emergency and should be followed up as soon as possible or go to the Emergency Department if any problems should occur.  Please show the CHEMOTHERAPY ALERT CARD or IMMUNOTHERAPY ALERT CARD at check-in to the Emergency Department and triage nurse.  Should you have questions after your visit or need to cancel or reschedule your appointment, please contact [facility]  - A DEPT OF [facility] [phone]  and follow the prompts.  Office hours are 8:00 a.m. to 4:30 p.m. Monday - Friday. Please note that voicemails left after 4:00 p.m. may not be returned until the following business day.  We are closed weekends and major holidays. You have access to a nurse at all times for urgent questions. Please call the main number to the clinic [phone] and follow the prompts.  For any non-urgent questions, you may also contact your provider using MyChart. We now offer e-Visits for anyone 64 and older to request care online for non-urgent symptoms. For details visit [email or web address].   Also download the MyChart app! Go to the app store, search "MyChart", open the app, select [facility], and log in with your MyChart username and password.

## 2023-05-04 ENCOUNTER — Inpatient Hospital Stay: Payer: 59

## 2023-05-04 ENCOUNTER — Other Ambulatory Visit: Payer: Self-pay

## 2023-05-04 LAB — CBC WITH DIFFERENTIAL/PLATELET
Abs Immature Granulocytes: 0.01 10*3/uL (ref 0.00–0.07)
Basophils Absolute: 0.1 10*3/uL (ref 0.0–0.1)
Basophils Relative: 1 %
Eosinophils Absolute: 0 10*3/uL (ref 0.0–0.5)
Eosinophils Relative: 0 %
HCT: 33.8 % — ABNORMAL LOW (ref 39.0–52.0)
Hemoglobin: 11.6 g/dL — ABNORMAL LOW (ref 13.0–17.0)
Immature Granulocytes: 0 %
Lymphocytes Relative: 40 %
Lymphs Abs: 3 10*3/uL (ref 0.7–4.0)
MCH: 34.1 pg — ABNORMAL HIGH (ref 26.0–34.0)
MCHC: 34.3 g/dL (ref 30.0–36.0)
MCV: 99.4 fL (ref 80.0–100.0)
Monocytes Absolute: 0.5 10*3/uL (ref 0.1–1.0)
Monocytes Relative: 6 %
Neutro Abs: 4 10*3/uL (ref 1.7–7.7)
Neutrophils Relative %: 53 %
Platelets: 203 10*3/uL (ref 150–400)
RBC: 3.4 MIL/uL — ABNORMAL LOW (ref 4.22–5.81)
RDW: 13.2 % (ref 11.5–15.5)
WBC: 7.6 10*3/uL (ref 4.0–10.5)
nRBC: 0 % (ref 0.0–0.2)

## 2023-05-04 NOTE — Patient Instructions (Signed)

## 2023-05-04 NOTE — Progress Notes (Signed)
Patient presents today for therapeutic phlebotomy.  Patient is in satisfactory condition with no new complaints voiced.  Vital signs are stable.  We will proceed with phlebotomy per provider orders.  Therapeutic phlebotomy started at 1441 and ended at 1450.  500 mL of blood removed from R wrist.  Vital signs remained stable.  Patient denies dizziness or lightheadedness.  Coban and gauze applied to site. Vital signs remained stable.  Patient refused to wait the recommended 30 minute post phlebotomy wait time.  Patient stayed approximately 20 minutes.  Patient left ambulatory in stable condition.

## 2023-05-12 ENCOUNTER — Other Ambulatory Visit: Payer: Self-pay

## 2023-05-13 ENCOUNTER — Inpatient Hospital Stay: Payer: 59

## 2023-05-13 LAB — CBC WITH DIFFERENTIAL/PLATELET
Abs Immature Granulocytes: 0.02 10*3/uL (ref 0.00–0.07)
Basophils Absolute: 0.1 10*3/uL (ref 0.0–0.1)
Basophils Relative: 1 %
Eosinophils Absolute: 0 10*3/uL (ref 0.0–0.5)
Eosinophils Relative: 0 %
HCT: 35.5 % — ABNORMAL LOW (ref 39.0–52.0)
Hemoglobin: 11.7 g/dL — ABNORMAL LOW (ref 13.0–17.0)
Immature Granulocytes: 0 %
Lymphocytes Relative: 32 %
Lymphs Abs: 2.7 10*3/uL (ref 0.7–4.0)
MCH: 32.9 pg (ref 26.0–34.0)
MCHC: 33 g/dL (ref 30.0–36.0)
MCV: 99.7 fL (ref 80.0–100.0)
Monocytes Absolute: 0.5 10*3/uL (ref 0.1–1.0)
Monocytes Relative: 6 %
Neutro Abs: 5.2 10*3/uL (ref 1.7–7.7)
Neutrophils Relative %: 61 %
Platelets: 202 10*3/uL (ref 150–400)
RBC: 3.56 MIL/uL — ABNORMAL LOW (ref 4.22–5.81)
RDW: 13.6 % (ref 11.5–15.5)
WBC: 8.5 10*3/uL (ref 4.0–10.5)
nRBC: 0 % (ref 0.0–0.2)

## 2023-05-13 NOTE — Patient Instructions (Signed)
[facility] [place] - [facility]  Discharge Instructions: Thank you for choosing [facility] to provide your oncology and hematology care.  If you have a lab appointment with the Cancer Center - please note that after April 8th, 2024, all labs will be drawn in the cancer center.  You do not have to check in or register with the main entrance as you have in the past but will complete your check-in in the cancer center.  Wear comfortable clothing and clothing appropriate for easy access to any Portacath or PICC line.   We strive to give you quality time with your provider. You may need to reschedule your appointment if you arrive late (15 or more minutes).  Arriving late affects you and other patients whose appointments are after yours.  Also, if you miss three or more appointments without notifying the office, you may be dismissed from the clinic at the provider's discretion.      For prescription refill requests, have your pharmacy contact our office and allow 72 hours for refills to be completed.    Today you received the following therapeutic phlebotomy, return as scheduled.   To help prevent nausea and vomiting after your treatment, we encourage you to take your nausea medication as directed.  BELOW ARE SYMPTOMS THAT SHOULD BE REPORTED IMMEDIATELY: *FEVER GREATER THAN 100.4 F (38 C) OR HIGHER *CHILLS OR SWEATING *NAUSEA AND VOMITING THAT IS NOT CONTROLLED WITH YOUR NAUSEA MEDICATION *UNUSUAL SHORTNESS OF BREATH *UNUSUAL BRUISING OR BLEEDING *URINARY PROBLEMS (pain or burning when urinating, or frequent urination) *BOWEL PROBLEMS (unusual diarrhea, constipation, pain near the anus) TENDERNESS IN MOUTH AND THROAT WITH OR WITHOUT PRESENCE OF ULCERS (sore throat, sores in mouth, or a toothache) UNUSUAL RASH, SWELLING OR PAIN  UNUSUAL VAGINAL DISCHARGE OR ITCHING   Items with * indicate a potential emergency and should be followed up as soon  as possible or go to the Emergency Department if any problems should occur.  Please show the CHEMOTHERAPY ALERT CARD or IMMUNOTHERAPY ALERT CARD at check-in to the Emergency Department and triage nurse.  Should you have questions after your visit or need to cancel or reschedule your appointment, please contact [facility] CTR [place] - A DEPT OF [facility] [phone]  and follow the prompts.  Office hours are 8:00 a.m. to 4:30 p.m. Monday - Friday. Please note that voicemails left after 4:00 p.m. may not be returned until the following business day.  We are closed weekends and major holidays. You have access to a nurse at all times for urgent questions. Please call the main number to the clinic [phone] and follow the prompts.  For any non-urgent questions, you may also contact your provider using MyChart. We now offer e-Visits for anyone 74 and older to request care online for non-urgent symptoms. For details visit [email or web address].   Also download the MyChart app! Go to the app store, search "MyChart", open the app, select [place], and log in with your MyChart username and password.

## 2023-05-13 NOTE — Progress Notes (Signed)
Howard [name] presents today for phlebotomy per MD orders. Phlebotomy procedure started at 1300 and ended at 1309. 500 cc removed. Patient tolerated procedure well. IV needle removed intact.

## 2023-05-19 ENCOUNTER — Other Ambulatory Visit: Payer: Self-pay

## 2023-05-20 ENCOUNTER — Inpatient Hospital Stay: Payer: 59

## 2023-05-20 ENCOUNTER — Inpatient Hospital Stay: Payer: 59 | Attending: Hematology

## 2023-05-20 LAB — CBC WITH DIFFERENTIAL/PLATELET
Abs Immature Granulocytes: 0.01 10*3/uL (ref 0.00–0.07)
Basophils Absolute: 0.1 10*3/uL (ref 0.0–0.1)
Basophils Relative: 1 %
Eosinophils Absolute: 0.1 10*3/uL (ref 0.0–0.5)
Eosinophils Relative: 2 %
HCT: 35.1 % — ABNORMAL LOW (ref 39.0–52.0)
Hemoglobin: 12 g/dL — ABNORMAL LOW (ref 13.0–17.0)
Immature Granulocytes: 0 %
Lymphocytes Relative: 41 %
Lymphs Abs: 3.2 10*3/uL (ref 0.7–4.0)
MCH: 33.8 pg (ref 26.0–34.0)
MCHC: 34.2 g/dL (ref 30.0–36.0)
MCV: 98.9 fL (ref 80.0–100.0)
Monocytes Absolute: 0.6 10*3/uL (ref 0.1–1.0)
Monocytes Relative: 8 %
Neutro Abs: 3.7 10*3/uL (ref 1.7–7.7)
Neutrophils Relative %: 48 %
Platelets: 221 10*3/uL (ref 150–400)
RBC: 3.55 MIL/uL — ABNORMAL LOW (ref 4.22–5.81)
RDW: 13.3 % (ref 11.5–15.5)
WBC: 7.7 10*3/uL (ref 4.0–10.5)
nRBC: 0 % (ref 0.0–0.2)

## 2023-05-20 NOTE — Progress Notes (Signed)
 Howard [name] presents today for phlebotomy per MD orders. Phlebotomy procedure started at 1253 and ended at 1255. Had to stick patient again to finish phlebotomy, restarted at 1259, ended at 1307. 500 grams removed. Patient observed for 5 minutes after procedure without any incident. Patient did not want to stay the whole wait time.  Patient tolerated procedure well. IV needle removed intact.   Phlebotomy done per orders. Patient tolerated it well without problems. Vitals stable and discharged home from clinic ambulatory. Follow up as scheduled.

## 2023-05-20 NOTE — Patient Instructions (Signed)
 [facility] [place] - A DEPT OF [facility]  Discharge Instructions: Thank you for choosing [facility] to provide your oncology and hematology care.  If you have a lab appointment with the Cancer Center - please note that after April 8th, 2024, all labs will be drawn in the cancer center.  You do not have to check in or register with the main entrance as you have in the past but will complete your check-in in the cancer center.  Wear comfortable clothing and clothing appropriate for easy access to any Portacath or PICC line.   We strive to give you quality time with your provider. You may need to reschedule your appointment if you arrive late (15 or more minutes).  Arriving late affects you and other patients whose appointments are after yours.  Also, if you miss three or more appointments without notifying the office, you may be dismissed from the clinic at the provider's discretion.      For prescription refill requests, have your pharmacy contact our office and allow 72 hours for refills to be completed.    Today you had a phlebotomy done   To help prevent nausea and vomiting after your treatment, we encourage you to take your nausea medication as directed.  BELOW ARE SYMPTOMS THAT SHOULD BE REPORTED IMMEDIATELY: *FEVER GREATER THAN 100.4 F (38 C) OR HIGHER *CHILLS OR SWEATING *NAUSEA AND VOMITING THAT IS NOT CONTROLLED WITH YOUR NAUSEA MEDICATION *UNUSUAL SHORTNESS OF BREATH *UNUSUAL BRUISING OR BLEEDING *URINARY PROBLEMS (pain or burning when urinating, or frequent urination) *BOWEL PROBLEMS (unusual diarrhea, constipation, pain near the anus) TENDERNESS IN MOUTH AND THROAT WITH OR WITHOUT PRESENCE OF ULCERS (sore throat, sores in mouth, or a toothache) UNUSUAL RASH, SWELLING OR PAIN  UNUSUAL VAGINAL DISCHARGE OR ITCHING   Items with * indicate a potential emergency and should be followed up as soon as possible or go to the Emergency Department  if any problems should occur.  Please show the CHEMOTHERAPY ALERT CARD or IMMUNOTHERAPY ALERT CARD at check-in to the Emergency Department and triage nurse.  Should you have questions after your visit or need to cancel or reschedule your appointment, please contact [facility] [place] - A DEPT OF [facility] [phone]  and follow the prompts.  Office hours are 8:00 a.m. to 4:30 p.m. Monday - Friday. Please note that voicemails left after 4:00 p.m. may not be returned until the following business day.  We are closed weekends and major holidays. You have access to a nurse at all times for urgent questions. Please call the main number to the clinic [phone] and follow the prompts.  For any non-urgent questions, you may also contact your provider using MyChart. We now offer e-Visits for anyone 67 and older to request care online for non-urgent symptoms. For details visit [email or web address].   Also download the MyChart app! Go to the app store, search MyChart, open the app, select [place], and log in with your MyChart username and password.

## 2023-05-26 ENCOUNTER — Other Ambulatory Visit: Payer: Self-pay

## 2023-05-27 ENCOUNTER — Inpatient Hospital Stay: Payer: 59

## 2023-05-27 LAB — CBC WITH DIFFERENTIAL/PLATELET
Abs Immature Granulocytes: 0.02 10*3/uL (ref 0.00–0.07)
Basophils Absolute: 0.1 10*3/uL (ref 0.0–0.1)
Basophils Relative: 1 %
Eosinophils Absolute: 0.2 10*3/uL (ref 0.0–0.5)
Eosinophils Relative: 3 %
HCT: 34 % — ABNORMAL LOW (ref 39.0–52.0)
Hemoglobin: 11.6 g/dL — ABNORMAL LOW (ref 13.0–17.0)
Immature Granulocytes: 0 %
Lymphocytes Relative: 39 %
Lymphs Abs: 2.4 10*3/uL (ref 0.7–4.0)
MCH: 34.3 pg — ABNORMAL HIGH (ref 26.0–34.0)
MCHC: 34.1 g/dL (ref 30.0–36.0)
MCV: 100.6 fL — ABNORMAL HIGH (ref 80.0–100.0)
Monocytes Absolute: 0.4 10*3/uL (ref 0.1–1.0)
Monocytes Relative: 6 %
Neutro Abs: 3.1 10*3/uL (ref 1.7–7.7)
Neutrophils Relative %: 51 %
Platelets: 218 10*3/uL (ref 150–400)
RBC: 3.38 MIL/uL — ABNORMAL LOW (ref 4.22–5.81)
RDW: 13.5 % (ref 11.5–15.5)
WBC: 6.1 10*3/uL (ref 4.0–10.5)
nRBC: 0 % (ref 0.0–0.2)

## 2023-05-27 NOTE — Progress Notes (Signed)
 Patient presents today for therapeutic phlebotomy.  Patient is in satisfactory condition with no new complaints voiced.  Vital signs are stable.  Hemoglobin today is 11.6.  We will proceed with phlebotomy per provider orders.  Therapeutic phlebotomy started at 0942 and ended at 0945.  500 mL of blood removed from L arm.  Vital signs remained stable.  Patient denies dizziness or lightheadedness.  Coban and gauze applied to site.  Patient refused to wait the recommended 30 minute post phlebotomy wait time.  Patient stayed approximately 10 minutes.  Patient left ambulatory in stable condition.

## 2023-05-27 NOTE — Patient Instructions (Signed)

## 2023-05-31 ENCOUNTER — Telehealth: Payer: Self-pay | Admitting: *Deleted

## 2023-05-31 ENCOUNTER — Encounter: Payer: Self-pay | Admitting: Gastroenterology

## 2023-05-31 ENCOUNTER — Ambulatory Visit: Payer: 59 | Admitting: Gastroenterology

## 2023-05-31 VITALS — BP 107/65 | HR 79 | Temp 98.1°F | Ht 68.0 in | Wt 157.2 lb

## 2023-05-31 DIAGNOSIS — R1011 Right upper quadrant pain: Secondary | ICD-10-CM | POA: Diagnosis not present

## 2023-05-31 DIAGNOSIS — Z862 Personal history of diseases of the blood and blood-forming organs and certain disorders involving the immune mechanism: Secondary | ICD-10-CM

## 2023-05-31 DIAGNOSIS — R7989 Other specified abnormal findings of blood chemistry: Secondary | ICD-10-CM

## 2023-05-31 DIAGNOSIS — K838 Other specified diseases of biliary tract: Secondary | ICD-10-CM

## 2023-05-31 NOTE — Patient Instructions (Addendum)
 Please complete labs at [facility], you can use the location down by Dr. Sharyon Medicus office or at [address] CT scan to be ordered.

## 2023-05-31 NOTE — Progress Notes (Signed)
 GI Office Note    Referring Provider: Bertell Satterfield, MD Primary Care Physician:  Bertell Satterfield, MD  Primary Gastroenterologist: Ozell Hollingshead, MD   Chief Complaint   Chief Complaint  Patient presents with   Abdominal Pain    Right upper quadrant pain    History of Present Illness   Howard [name] is a 58 y.o. male presenting today for right sided abdominal pain. 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 Down to 885 in 11/2022, back up to 1246 in 02/2023. No evidence of portal HTN on prior U/S or MRI. Spleen normal. Mild dilation of biliary tree. Elastography kPa of 5.6. No etoh in five years.  On MRI liver with and without contrast January 2024 he appeared to have mild biliary duct dilation with benign-appearing stricture.  Last therapeutic phlebotomy May 27, 2023.  Today: Presenting with concerns for right upper quadrant pain which radiates into the right lateral chest.  Has noted worsening pain with walking, movement, but also with meals.  Sometimes passing gas helps.  Using his inversion table often provide some relief.  Sometimes gets deep cramping in that area with certain movements, having to change position to get relief.  No issues moving his bowels.  Very regular.  No melena or rectal bleeding.  Trying to eat healthy.  Over the past 5 years he has dropped about 100 pounds.  Goes to the gym.  No alcohol in 5 years.  Notes he has significant scoliosis, not sure if this is causing any of his issues with right upper quadrant pain/right lateral chest pain.  Also wonders if he could be getting another hernia.  Had a hernia repair December 2023, right inguinal area.      Wt Readings from Last 50 Encounters:  05/31/23 157 lb 3.2 oz (71.3 kg)  03/23/23 158 lb 3.2 oz (71.8 kg)  02/04/23 158 lb 6.4 oz (71.8 kg)  12/08/22 161 lb (73 kg)  09/07/22 169 lb 6.4 oz (76.8 kg)  07/15/22  165 lb 6.4 oz (75 kg)  07/02/22 167 lb (75.8 kg)  06/07/22 165 lb (74.8 kg)  06/01/22 166 lb (75.3 kg)  05/07/22 165 lb (74.8 kg)  04/20/22 166 lb (75.3 kg)  03/31/22 166 lb 3.2 oz (75.4 kg)  06/03/21 166 lb (75.3 kg)  10/07/20 170 lb 12.8 oz (77.5 kg)  03/19/20 172 lb 12.8 oz (78.4 kg)  03/08/20 170 lb (77.1 kg)  07/12/19 194 lb 6.4 oz (88.2 kg)  06/07/19 184 lb 15.5 oz (83.9 kg)  06/05/19 185 lb (83.9 kg)  03/07/19 191 lb 3.2 oz (86.7 kg)  10/24/18 200 lb (90.7 kg)  10/23/18 202 lb 12.8 oz (92 kg)  07/03/18 200 lb 12.8 oz (91.1 kg)  12/20/16 226 lb 12.8 oz (102.9 kg)  12/31/15 215 lb (97.5 kg)  12/24/15 215 lb (97.5 kg)  07/06/15 208 lb (94.3 kg)  06/04/15 212 lb (96.2 kg)  03/27/15 215 lb (97.5 kg)  12/16/14 223 lb (101.2 kg)  08/27/14 243 lb (110.2 kg)  08/19/14 243 lb (110.2 kg)  03/20/13 230 lb (104.3 kg)   Colonoscopy January 2021: -Diverticulosis in the sigmoid colon and descending colon -Nonbleeding internal hemorrhoids    Medications   Current Outpatient Medications  Medication Sig Dispense Refill   ALPRAZolam  (XANAX ) 0.5 MG tablet Take 0.5 mg by mouth 2 (two) times daily as needed for anxiety.     amphetamine -dextroamphetamine  (ADDERALL) 20 MG tablet  Take 20 mg by mouth 3 (three) times daily.     Buprenorphine HCl-Naloxone HCl 8-2 MG FILM Place under the tongue.     folic acid  (FOLVITE ) 1 MG tablet Take 1 mg by mouth daily.     Golimumab (SIMPONI ARIA IV) Inject 1 Dose into the vein every 8 (eight) weeks.     levothyroxine  (SYNTHROID , LEVOTHROID) 25 MCG tablet Take 25 mcg by mouth daily before breakfast.      Methotrexate Sodium (METHOTREXATE, PF,) 50 MG/2ML injection Inject 0.7 mLs into the muscle once a week.     omeprazole (PRILOSEC) 20 MG capsule Take 20 mg by mouth daily as needed (acid reflux).     rosuvastatin  (CRESTOR ) 5 MG tablet TAKE 1 TABLET (5 MG TOTAL) BY MOUTH DAILY. 90 tablet 1   tamsulosin (FLOMAX) 0.4 MG CAPS capsule Take 0.4 mg by mouth  daily.     No current facility-administered medications for this visit.    Allergies   Allergies as of 05/31/2023   (No Known Allergies)        Review of Systems   General: Negative for anorexia, unintentional weight loss, fever, chills, fatigue, weakness. ENT: Negative for hoarseness, difficulty swallowing , nasal congestion. CV: Negative for chest pain, angina, palpitations, dyspnea on exertion, peripheral edema.  Respiratory: Negative for dyspnea at rest, dyspnea on exertion, cough, sputum, wheezing.  GI: See history of present illness. GU:  Negative for dysuria, hematuria, urinary incontinence, urinary frequency, nocturnal urination.  Endo: Negative for unusual weight change.     Physical Exam   BP 107/65 (BP Location: Right Arm, Patient Position: Sitting, Cuff Size: Normal)   Pulse 79   Temp 98.1 F (36.7 C) (Oral)   Ht 5' 8 (1.727 m)   Wt 157 lb 3.2 oz (71.3 kg)   SpO2 98%   BMI 23.90 kg/m    General: Well-nourished, well-developed in no acute distress.  Significant scoliosis noted with abnormal contouring/curvature towards the left Eyes: No icterus. Mouth: Oropharyngeal mucosa moist and pink   Lungs: Clear to auscultation bilaterally.  Heart: Regular rate and rhythm, no murmurs rubs or gallops.  Abdomen: Bowel sounds are normal,  nondistended, no hepatosplenomegaly or masses,  no abdominal bruits or hernia , no rebound or guarding. Mild ruq tenderness. No pain with palpation over the right ribs. Rectal: not performed Extremities: No lower extremity edema. No clubbing or deformities. Neuro: Alert and oriented x 4   Skin: Warm and dry, no jaundice.   Psych: Alert and cooperative, normal mood and affect.  Labs   Lab Results  Component Value Date   WBC 6.1 05/27/2023   HGB 11.6 (L) 05/27/2023   HCT 34.0 (L) 05/27/2023   MCV 100.6 (H) 05/27/2023   PLT 218 05/27/2023   Lab Results  Component Value Date   IRON 177 03/15/2023   TIBC 204 (L) 03/15/2023    FERRITIN 1,246 (H) 03/15/2023   Lab Results  Component Value Date   ALT 52 (H) 02/16/2023   AST 56 (H) 02/16/2023   ALKPHOS 92 07/15/2022   BILITOT 0.6 02/16/2023     Imaging Studies   No results found.  Assessment/Plan:     RUQ pain: Suspect musculoskeletal in the setting of significant scoliosis however given history of hemochromatosis, elevated LFTs, common bile duct dilation with benign-appearing stricture, we need to consider other etiologies.  Would recommend updating labs, reimaging. -CBC, CMET, lipase -CT abdomen pelvis with contrast   Sonny RAMAN. Ezzard, MHS, PA-C [facility]

## 2023-05-31 NOTE — Telephone Encounter (Signed)
 Per [employer] "This member's plan does not currently require notification or prior-authorization through the [facility] Notification or Prior-Authorization Program."  Case Number: [id number]

## 2023-06-03 ENCOUNTER — Inpatient Hospital Stay: Payer: 59

## 2023-06-03 LAB — CBC
HCT: 34.6 % — ABNORMAL LOW (ref 39.0–52.0)
Hemoglobin: 11.8 g/dL — ABNORMAL LOW (ref 13.0–17.0)
MCH: 34.7 pg — ABNORMAL HIGH (ref 26.0–34.0)
MCHC: 34.1 g/dL (ref 30.0–36.0)
MCV: 101.8 fL — ABNORMAL HIGH (ref 80.0–100.0)
Platelets: 215 10*3/uL (ref 150–400)
RBC: 3.4 MIL/uL — ABNORMAL LOW (ref 4.22–5.81)
RDW: 13.4 % (ref 11.5–15.5)
WBC: 6.8 10*3/uL (ref 4.0–10.5)
nRBC: 0 % (ref 0.0–0.2)

## 2023-06-03 NOTE — Progress Notes (Signed)
  Howard [name] presents today for phlebotomy per MD orders. Phlebotomy procedure started at 1200 and ended at 1205. 30 ml removed. Patient tolerated procedure well. Unable to complete total phlebotomy and patient did not want to stay.   IV needle removed intact.  Patient tolerated phlebotomy with no complaints voiced.  Peripheral IV site intact with no bruising or swelling noted.  Denied SOB, chest pain, or dizziness.  Gauze with coban applied.  Discharged with VSS and no s/s of distress noted.

## 2023-06-03 NOTE — Patient Instructions (Signed)

## 2023-06-07 ENCOUNTER — Ambulatory Visit (HOSPITAL_COMMUNITY)
Admission: RE | Admit: 2023-06-07 | Discharge: 2023-06-07 | Disposition: A | Discharge status: Home / Self Care | Payer: 59 | Source: Ambulatory Visit | Attending: Gastroenterology | Admitting: Gastroenterology

## 2023-06-07 ENCOUNTER — Encounter (HOSPITAL_COMMUNITY): Payer: Self-pay | Admitting: Radiology

## 2023-06-07 DIAGNOSIS — R7989 Other specified abnormal findings of blood chemistry: Secondary | ICD-10-CM | POA: Insufficient documentation

## 2023-06-07 DIAGNOSIS — K838 Other specified diseases of biliary tract: Secondary | ICD-10-CM | POA: Diagnosis present

## 2023-06-07 DIAGNOSIS — R1011 Right upper quadrant pain: Secondary | ICD-10-CM | POA: Diagnosis present

## 2023-06-07 MED ORDER — IOHEXOL 300 MG/ML  SOLN
100.0000 mL | Freq: Once | INTRAMUSCULAR | Status: AC | PRN
  Administered 2023-06-07: 100 mL via INTRAVENOUS

## 2023-06-09 ENCOUNTER — Other Ambulatory Visit: Payer: Self-pay

## 2023-06-09 ENCOUNTER — Ambulatory Visit: Payer: 59 | Admitting: Cardiology

## 2023-06-10 ENCOUNTER — Inpatient Hospital Stay: Payer: 59

## 2023-06-10 LAB — CBC WITH DIFFERENTIAL/PLATELET
Abs Immature Granulocytes: 0 10*3/uL (ref 0.00–0.07)
Basophils Absolute: 0.1 10*3/uL (ref 0.0–0.1)
Basophils Relative: 1 %
Eosinophils Absolute: 0 10*3/uL (ref 0.0–0.5)
Eosinophils Relative: 0 %
HCT: 37 % — ABNORMAL LOW (ref 39.0–52.0)
Hemoglobin: 12.7 g/dL — ABNORMAL LOW (ref 13.0–17.0)
Immature Granulocytes: 0 %
Lymphocytes Relative: 52 %
Lymphs Abs: 3.3 10*3/uL (ref 0.7–4.0)
MCH: 34.7 pg — ABNORMAL HIGH (ref 26.0–34.0)
MCHC: 34.3 g/dL (ref 30.0–36.0)
MCV: 101.1 fL — ABNORMAL HIGH (ref 80.0–100.0)
Monocytes Absolute: 0.5 10*3/uL (ref 0.1–1.0)
Monocytes Relative: 9 %
Neutro Abs: 2.4 10*3/uL (ref 1.7–7.7)
Neutrophils Relative %: 38 %
Platelets: 215 10*3/uL (ref 150–400)
RBC: 3.66 MIL/uL — ABNORMAL LOW (ref 4.22–5.81)
RDW: 13.2 % (ref 11.5–15.5)
WBC: 6.2 10*3/uL (ref 4.0–10.5)
nRBC: 0 % (ref 0.0–0.2)

## 2023-06-10 NOTE — Progress Notes (Signed)
Pt presents to clinic for a phlebotomy. Current BP 104/71. Boneta Lucks, NP notified. Per NP to proceed with phlebotomy 500 mL. Pt updated and agrees to plan.    Howard [name] presents today for phlebotomy per MD orders. Phlebotomy procedure started at 1326 and ended at 1338 500 cc removed. Patient tolerated procedure well. IV needle removed intact. pt refused to wait the full 30 minutes post phlebotomy. Drink was provided to patient. Vitals stable. Per pt he feels fine and at his baseline. Pt stable at discharge. All follow ups as scheduled.   Howard [name]

## 2023-06-10 NOTE — Patient Instructions (Signed)

## 2023-06-16 LAB — CBC WITH DIFFERENTIAL/PLATELET
Basophils Absolute: 0.1 10*3/uL (ref 0.0–0.2)
Basos: 1 %
EOS (ABSOLUTE): 0.1 10*3/uL (ref 0.0–0.4)
Eos: 2 %
Hematocrit: 38.4 % (ref 37.5–51.0)
Hemoglobin: 12.3 g/dL — ABNORMAL LOW (ref 13.0–17.7)
Immature Grans (Abs): 0 10*3/uL (ref 0.0–0.1)
Immature Granulocytes: 0 %
Lymphocytes Absolute: 2.2 10*3/uL (ref 0.7–3.1)
Lymphs: 42 %
MCH: 33.2 pg — ABNORMAL HIGH (ref 26.6–33.0)
MCHC: 32 g/dL (ref 31.5–35.7)
MCV: 104 fL — ABNORMAL HIGH (ref 79–97)
Monocytes Absolute: 0.3 10*3/uL (ref 0.1–0.9)
Monocytes: 6 %
Neutrophils Absolute: 2.6 10*3/uL (ref 1.4–7.0)
Neutrophils: 49 %
Platelets: 191 10*3/uL (ref 150–450)
RBC: 3.7 x10E6/uL — ABNORMAL LOW (ref 4.14–5.80)
RDW: 12 % (ref 11.6–15.4)
WBC: 5.2 10*3/uL (ref 3.4–10.8)

## 2023-06-16 LAB — COMPREHENSIVE METABOLIC PANEL
ALT: 33 [IU]/L (ref 0–44)
AST: 39 [IU]/L (ref 0–40)
Albumin: 4.4 g/dL (ref 3.8–4.9)
Alkaline Phosphatase: 109 [IU]/L (ref 44–121)
BUN/Creatinine Ratio: 30 — ABNORMAL HIGH (ref 9–20)
BUN: 18 mg/dL (ref 6–24)
Bilirubin Total: 0.5 mg/dL (ref 0.0–1.2)
CO2: 27 mmol/L (ref 20–29)
Calcium: 9.1 mg/dL (ref 8.7–10.2)
Chloride: 102 mmol/L (ref 96–106)
Creatinine, Ser: 0.61 mg/dL — ABNORMAL LOW (ref 0.76–1.27)
Globulin, Total: 1.3 g/dL — ABNORMAL LOW (ref 1.5–4.5)
Glucose: 86 mg/dL (ref 70–99)
Potassium: 4.8 mmol/L (ref 3.5–5.2)
Sodium: 142 mmol/L (ref 134–144)
Total Protein: 5.7 g/dL — ABNORMAL LOW (ref 6.0–8.5)
eGFR: 113 mL/min/{1.73_m2} (ref >59)

## 2023-06-16 LAB — LIPASE: Lipase: 12 U/L — ABNORMAL LOW (ref 13–78)

## 2023-06-17 ENCOUNTER — Inpatient Hospital Stay: Payer: 59

## 2023-06-17 LAB — CBC
HCT: 36.6 % — ABNORMAL LOW (ref 39.0–52.0)
Hemoglobin: 12.3 g/dL — ABNORMAL LOW (ref 13.0–17.0)
MCH: 33.5 pg (ref 26.0–34.0)
MCHC: 33.6 g/dL (ref 30.0–36.0)
MCV: 99.7 fL (ref 80.0–100.0)
Platelets: 199 10*3/uL (ref 150–400)
RBC: 3.67 MIL/uL — ABNORMAL LOW (ref 4.22–5.81)
RDW: 12.9 % (ref 11.5–15.5)
WBC: 5.7 10*3/uL (ref 4.0–10.5)
nRBC: 0 % (ref 0.0–0.2)

## 2023-06-17 NOTE — Patient Instructions (Signed)
[facility] [place] - A DEPT OF [facility]  Discharge Instructions: Thank you for choosing [facility] to provide your oncology and hematology care.  If you have a lab appointment with the Cancer Center - please note that after April 8th, 2024, all labs will be drawn in the cancer center.  You do not have to check in or register with the main entrance as you have in the past but will complete your check-in in the cancer center.  Wear comfortable clothing and clothing appropriate for easy access to any Portacath or PICC line.   We strive to give you quality time with your provider. You may need to reschedule your appointment if you arrive late (15 or more minutes).  Arriving late affects you and other patients whose appointments are after yours.  Also, if you miss three or more appointments without notifying the office, you may be dismissed from the clinic at the provider's discretion.      For prescription refill requests, have your pharmacy contact our office and allow 72 hours for refills to be completed.    Today you received your phlebotomy   To help prevent nausea and vomiting after your treatment, we encourage you to take your nausea medication as directed.  BELOW ARE SYMPTOMS THAT SHOULD BE REPORTED IMMEDIATELY: *FEVER GREATER THAN 100.4 F (38 C) OR HIGHER *CHILLS OR SWEATING *NAUSEA AND VOMITING THAT IS NOT CONTROLLED WITH YOUR NAUSEA MEDICATION *UNUSUAL SHORTNESS OF BREATH *UNUSUAL BRUISING OR BLEEDING *URINARY PROBLEMS (pain or burning when urinating, or frequent urination) *BOWEL PROBLEMS (unusual diarrhea, constipation, pain near the anus) TENDERNESS IN MOUTH AND THROAT WITH OR WITHOUT PRESENCE OF ULCERS (sore throat, sores in mouth, or a toothache) UNUSUAL RASH, SWELLING OR PAIN  UNUSUAL VAGINAL DISCHARGE OR ITCHING   Items with * indicate a potential emergency and should be followed up as soon as possible or go to the Emergency  Department if any problems should occur.  Please show the CHEMOTHERAPY ALERT CARD or IMMUNOTHERAPY ALERT CARD at check-in to the Emergency Department and triage nurse.  Should you have questions after your visit or need to cancel or reschedule your appointment, please contact [facility] CANCER CTR [place] - A DEPT OF Eligha Bridegroom [facility] [phone]  and follow the prompts.  Office hours are 8:00 a.m. to 4:30 p.m. Monday - Friday. Please note that voicemails left after 4:00 p.m. may not be returned until the following business day.  We are closed weekends and major holidays. You have access to a nurse at all times for urgent questions. Please call the main number to the clinic [phone] and follow the prompts.  For any non-urgent questions, you may also contact your provider using MyChart. We now offer e-Visits for anyone 7 and older to request care online for non-urgent symptoms. For details visit [email or web address].   Also download the MyChart app! Go to the app store, search "MyChart", open the app, select [place], and log in with your MyChart username and password.

## 2023-06-17 NOTE — Progress Notes (Signed)
Howard [name] presents today for phlebotomy per MD orders. Phlebotomy procedure started at 1301 and ended at 1312. 500 grams removed. Patient observed for 20 minutes after procedure without any incident. Patient tolerated procedure well. IV needle removed intact.

## 2023-06-24 ENCOUNTER — Inpatient Hospital Stay: Payer: 59

## 2023-06-24 ENCOUNTER — Inpatient Hospital Stay: Payer: 59 | Attending: Hematology

## 2023-06-24 DIAGNOSIS — Z79899 Other long term (current) drug therapy: Secondary | ICD-10-CM | POA: Insufficient documentation

## 2023-06-24 LAB — IRON AND TIBC
Iron: 161 ug/dL (ref 45–182)
Saturation Ratios: 86 % — ABNORMAL HIGH (ref 17.9–39.5)
TIBC: 188 ug/dL — ABNORMAL LOW (ref 250–450)
UIBC: 27 ug/dL

## 2023-06-24 LAB — CBC WITH DIFFERENTIAL/PLATELET
Abs Immature Granulocytes: 0 10*3/uL (ref 0.00–0.07)
Basophils Absolute: 0 10*3/uL (ref 0.0–0.1)
Basophils Relative: 1 %
Eosinophils Absolute: 0 10*3/uL (ref 0.0–0.5)
Eosinophils Relative: 0 %
HCT: 33.2 % — ABNORMAL LOW (ref 39.0–52.0)
Hemoglobin: 11.6 g/dL — ABNORMAL LOW (ref 13.0–17.0)
Immature Granulocytes: 0 %
Lymphocytes Relative: 48 %
Lymphs Abs: 2 10*3/uL (ref 0.7–4.0)
MCH: 35.2 pg — ABNORMAL HIGH (ref 26.0–34.0)
MCHC: 34.9 g/dL (ref 30.0–36.0)
MCV: 100.6 fL — ABNORMAL HIGH (ref 80.0–100.0)
Monocytes Absolute: 0.3 10*3/uL (ref 0.1–1.0)
Monocytes Relative: 6 %
Neutro Abs: 1.9 10*3/uL (ref 1.7–7.7)
Neutrophils Relative %: 45 %
Platelets: 171 10*3/uL (ref 150–400)
RBC: 3.3 MIL/uL — ABNORMAL LOW (ref 4.22–5.81)
RDW: 13.2 % (ref 11.5–15.5)
WBC: 4.2 10*3/uL (ref 4.0–10.5)
nRBC: 0 % (ref 0.0–0.2)

## 2023-06-24 LAB — COMPREHENSIVE METABOLIC PANEL
ALT: 40 U/L (ref 0–44)
AST: 40 U/L (ref 15–41)
Albumin: 3.8 g/dL (ref 3.5–5.0)
Alkaline Phosphatase: 77 U/L (ref 38–126)
Anion gap: 8 (ref 5–15)
BUN: 17 mg/dL (ref 6–20)
CO2: 27 mmol/L (ref 22–32)
Calcium: 8.6 mg/dL — ABNORMAL LOW (ref 8.9–10.3)
Chloride: 104 mmol/L (ref 98–111)
Creatinine, Ser: 0.68 mg/dL (ref 0.61–1.24)
GFR, Estimated: >60 mL/min (ref >60)
Glucose, Bld: 108 mg/dL — ABNORMAL HIGH (ref 70–99)
Potassium: 4 mmol/L (ref 3.5–5.1)
Sodium: 139 mmol/L (ref 135–145)
Total Bilirubin: 0.6 mg/dL (ref 0.0–1.2)
Total Protein: 5.5 g/dL — ABNORMAL LOW (ref 6.5–8.1)

## 2023-06-24 LAB — FERRITIN: Ferritin: 585 ng/mL — ABNORMAL HIGH (ref 24–336)

## 2023-06-24 NOTE — Patient Instructions (Signed)
 [facility] [place] [facility]  Discharge Instructions: Thank you for choosing [facility] to provide your oncology and hematology care.  If you have a lab appointment with the Cancer Center - please note that after April 8th, 2024, all labs will be drawn in the cancer center.  You do not have to check in or register with the main entrance as you have in the past but will complete your check-in in the cancer center.  Wear comfortable clothing and clothing appropriate for easy access to any Portacath or PICC line.   We strive to give you quality time with your provider. You may need to reschedule your appointment if you arrive late (15 or more minutes).  Arriving late affects you and other patients whose appointments are after yours.  Also, if you miss three or more appointments without notifying the office, you may be dismissed from the clinic at the provider's discretion.      For prescription refill requests, have your pharmacy contact our office and allow 72 hours for refills to be completed.    Today you received the following chemotherapy and/or immunotherapy agents Phlebotomy      To help prevent nausea and vomiting after your treatment, we encourage you to take your nausea medication as directed.  BELOW ARE SYMPTOMS THAT SHOULD BE REPORTED IMMEDIATELY: *FEVER GREATER THAN 100.4 F (38 C) OR HIGHER *CHILLS OR SWEATING *NAUSEA AND VOMITING THAT IS NOT CONTROLLED WITH YOUR NAUSEA MEDICATION *UNUSUAL SHORTNESS OF BREATH *UNUSUAL BRUISING OR BLEEDING *URINARY PROBLEMS (pain or burning when urinating, or frequent urination) *BOWEL PROBLEMS (unusual diarrhea, constipation, pain near the anus) TENDERNESS IN MOUTH AND THROAT WITH OR WITHOUT PRESENCE OF ULCERS (sore throat, sores in mouth, or a toothache) UNUSUAL RASH, SWELLING OR PAIN  UNUSUAL VAGINAL DISCHARGE OR ITCHING   Items with * indicate a potential emergency and should be followed  up as soon as possible or go to the Emergency Department if any problems should occur.  Please show the CHEMOTHERAPY ALERT CARD or IMMUNOTHERAPY ALERT CARD at check-in to the Emergency Department and triage nurse.  Should you have questions after your visit or need to cancel or reschedule your appointment, please contact [facility] CANCER CTR [place] - A DEPT OF Eligha Bridegroom [facility] [phone]  and follow the prompts.  Office hours are 8:00 a.m. to 4:30 p.m. Monday - Friday. Please note that voicemails left after 4:00 p.m. may not be returned until the following business day.  We are closed weekends and major holidays. You have access to a nurse at all times for urgent questions. Please call the main number to the clinic [phone] and follow the prompts.  For any non-urgent questions, you may also contact your provider using MyChart. We now offer e-Visits for anyone 11 and older to request care online for non-urgent symptoms. For details visit [email or web address].   Also download the MyChart app! Go to the app store, search "MyChart", open the app, select [place], and log in with your MyChart username and password.

## 2023-06-24 NOTE — Progress Notes (Signed)
 Patient presents today for phlebotomy via port a cath per MD orders. Phlebotomy procedure started at 1305 and ended at 1258    . 500 cc removed. Patient tolerated procedure well. Portacath flushed with 20ml NS and 500U/5ml Heparin and needle removed intact.  Procedure tolerated well and without incident.  Discharged ambulatory in stable condition.

## 2023-07-01 ENCOUNTER — Other Ambulatory Visit: Payer: Self-pay

## 2023-07-01 ENCOUNTER — Inpatient Hospital Stay: Payer: 59

## 2023-07-01 LAB — CBC WITH DIFFERENTIAL/PLATELET
Abs Immature Granulocytes: 0.01 10*3/uL (ref 0.00–0.07)
Basophils Absolute: 0.1 10*3/uL (ref 0.0–0.1)
Basophils Relative: 1 %
Eosinophils Absolute: 0.1 10*3/uL (ref 0.0–0.5)
Eosinophils Relative: 2 %
HCT: 35.4 % — ABNORMAL LOW (ref 39.0–52.0)
Hemoglobin: 12 g/dL — ABNORMAL LOW (ref 13.0–17.0)
Immature Granulocytes: 0 %
Lymphocytes Relative: 48 %
Lymphs Abs: 2.7 10*3/uL (ref 0.7–4.0)
MCH: 34.1 pg — ABNORMAL HIGH (ref 26.0–34.0)
MCHC: 33.9 g/dL (ref 30.0–36.0)
MCV: 100.6 fL — ABNORMAL HIGH (ref 80.0–100.0)
Monocytes Absolute: 0.4 10*3/uL (ref 0.1–1.0)
Monocytes Relative: 7 %
Neutro Abs: 2.3 10*3/uL (ref 1.7–7.7)
Neutrophils Relative %: 42 %
Platelets: 195 10*3/uL (ref 150–400)
RBC: 3.52 MIL/uL — ABNORMAL LOW (ref 4.22–5.81)
RDW: 13.1 % (ref 11.5–15.5)
WBC: 5.5 10*3/uL (ref 4.0–10.5)
nRBC: 0 % (ref 0.0–0.2)

## 2023-07-01 NOTE — Progress Notes (Unsigned)
[facility] [address], [place] [address]   CLINIC:  Medical Oncology/Hematology  PCP:  Elfredia Nevins, MD [address] [place] [address] [phone]   REASON FOR VISIT:  Follow-up for hereditary hemochromatosis (C282Y homozygous)  CURRENT THERAPY: Therapeutic phlebotomy  INTERVAL HISTORY:   Howard [name] 58 y.o. male returns for routine follow-up of hereditary hemochromatosis.  He was last seen on 03/23/2023 by Rojelio Brenner PA-C.  At today's visit, he reports feeling fairly well.  He continues to feel better overall since starting phlebotomy in January 2024.  Joint pain and appetite have both improved.  He has been tolerating phlebotomy well.  He also notes improved mental clarity since starting phlebotomy.  He has 75% energy and 75% appetite. He endorses that he is maintaining a stable weight.  ASSESSMENT & PLAN:  1.  Hereditary hemochromatosis, C282Y homozygous - Seen at the request of Dr. Marletta Lor (gastroenterology) for further evaluation and management of hemochromatosis, which was discovered during workup of transaminitis. Iron panel (04/07/2022) the with ferritin 2828, iron saturation 95%. Hemochromatosis DNA testing revealed C282Y homozygosity (04/19/2022) MRI abdomen (05/24/2022) consistent with severe diffuse hepatic iron deposition disease, without evidence of iron deposition within the spleen, pancreas, or bone marrow. - No family history of hemochromatosis. - Symptomatic iron overload with fatigue and arthralgia.  Symptoms have improved after starting phlebotomy.   - Weekly phlebotomy started in January 2024.  Tolerating phlebotomy fairly well. - Most iron panel (06/24/2023): Ferritin 585, iron saturation 86% - CBC/D (07/01/2023): Hgb 12.0/MCV 100.6.  Normal WBC/platelets. - Have discussed with patient the natural course of untreated hereditary hemochromatosis with risk of cirrhosis, hepatocellular carcinoma, heart failure, and  arrhythmia.  Iron overload also increases risk of certain bacterial infections. - We have discussed lifestyle modifications such as avoiding alcohol and iron supplementation.  Recent guidelines do not find that moderate red meat intake adversely affects patients with hemochromatosis.  Patient should avoid raw fish or undercooked meat due to risk of certain bacterial infections to thrive and iron rich environments. - We have discussed phlebotomy protocol for treatment of hemochromatosis: Weekly phlebotomy as tolerated until goal ferritin is reached. If Hgb drops < 11.0, we will decrease frequency of phlebotomy. If patient experiences severe side effects or hemodynamic instability, we will decrease amount taken for phlebotomy. Goal is ferritin < 50 for normalization of iron stores. Once iron stores have normalized, we will decrease frequency of phlebotomy. PLAN: Continue weekly CBC + phlebotomy (as long as Hgb >11.0)  - Labs and RTC in 3 months   2.  Mild macrocytic anemia - Noted to have mild normocytic anemia (prior to starting phlebotomy), with Hgb 12.5/MCV 97.7 on 06/07/2022 - Macrocytosis was not present until after patient started receiving regular phlebotomies - Anemia workup (07/15/2022): Normal B12 at 548, normal MMA.  Normal folate. CMP shows normal kidney function. SPEP, immunofixation, light chains negative for any monoclonal gammopathy - Reticulocytes somewhat low for degree of anemia at 0.7%  - Currently receiving weekly phlebotomy for hereditary hemochromatosis, denies any other sources of blood loss.   - PLAN: We will continue to monitor and consider additional testing if any severe worsening of anemia out of proportion to regular phlebotomies.  3.   Tobacco use - Current active smoker, 1 PPD since age 41 - Receives regular CT scans via cardiothoracic/cardiology for monitoring of bicuspid aorta and thoracic aortic aneurysm - CTA chest (02/21/2023) negative for any signs of  pulmonary malignancy - PLAN: We discussed importance  of smoking cessation and will continue to follow-up on this at future visits. - Patient qualifies for LDCT scan, but this is unnecessary as he is currently receiving regular cardiac/chest imaging as above.  4.  Other history - PMH: Psoriatic arthritis (on chronic methotrexate), fatty liver disease, history of alcoholism, GERD, chronic pain, bicuspid aorta with thoracic aortic aneurysm, anxiety/depression, osteoarthritis.  Last colonoscopy was in 2021, with recommendations for repeat colonoscopy in 10 years.  - SOCIAL: Lives at home with his wife. He is currently on disability due to his psoriatic arthritis, but previously worked in [employer]. He has a history of alcoholism (drank heavily from age 53 to age 21), but has been sober since 2019. He also reports history of illicit drug use (cocaine, marijuana, opioid pills, fentanyl), but has been clean for the past 4 years. He is a current active smoker, 1 PPD since age 34.  - FAMILY: No family history of hemochromatosis, blood problems, cancer  PLAN SUMMARY: >> Weekly CBC + phlebotomy (as long as Hgb >11) >> Labs in 3 months = CBC/D, CMP, ferritin, iron/TIBC  >> OFFICE visit in 3 months     REVIEW OF SYSTEMS:   Review of Systems  Constitutional:  Negative for appetite change, chills, diaphoresis, fatigue, fever and unexpected weight change.  HENT:   Negative for lump/mass and nosebleeds.   Eyes:  Negative for eye problems.  Respiratory:  Negative for cough, hemoptysis and shortness of breath.   Cardiovascular:  Negative for chest pain, leg swelling and palpitations.  Gastrointestinal:  Negative for abdominal pain, blood in stool, constipation, diarrhea, nausea and vomiting.  Genitourinary:  Negative for hematuria.   Musculoskeletal:  Positive for arthralgias and back pain.  Skin: Negative.   Neurological:  Positive for headaches. Negative for dizziness, light-headedness and numbness.   Hematological:  Does not bruise/bleed easily.     PHYSICAL EXAM:  ECOG PERFORMANCE STATUS: 1 - Symptomatic but completely ambulatory  Vitals:   07/04/23 1407  BP: 111/76  Pulse: 74  Resp: 16  Temp: 98.1 F (36.7 C)  SpO2: 99%   Filed Weights   07/04/23 1407  Weight: 156 lb 12 oz (71.1 kg)   Physical Exam Constitutional:      Appearance: Normal appearance. He is normal weight.  Cardiovascular:     Heart sounds: Normal heart sounds.  Pulmonary:     Breath sounds: Normal breath sounds.  Musculoskeletal:     Thoracic back: Scoliosis present.  Neurological:     General: No focal deficit present.     Mental Status: Mental status is at baseline.  Psychiatric:        Behavior: Behavior normal. Behavior is cooperative.    PAST MEDICAL/SURGICAL HISTORY:  Past Medical History:  Diagnosis Date   Alcohol abuse    Anxiety    Chronic back pain    Chronic pain    Depression    Fall    Fracture of right elbow    Hereditary hemochromatosis (HCC) 06/01/2022   Osteoarthritis    Psoriasis    Past Surgical History:  Procedure Laterality Date   APPENDECTOMY     COLONOSCOPY WITH PROPOFOL N/A 06/07/2019   diverticulosis, otherwise normal. Non-bleeding internal hemorrhoids.    FOOT SURGERY     per patient: "neurofibroma excision"   LUMBAR LAMINECTOMY/DECOMPRESSION MICRODISCECTOMY Bilateral 12/31/2015   Procedure: LUMBER DECOMPRESSION L4-5/ L5-S1 BILATERALLY  ;  Surgeon: Jene Every, MD;  Location: [facility] ORS;  Service: Orthopedics;  Laterality: Bilateral;   REVERSE SHOULDER ARTHROPLASTY Right  10/24/2018   REVERSE SHOULDER ARTHROPLASTY Right 10/24/2018   Procedure: REVERSE SHOULDER ARTHROPLASTY;  Surgeon: Yolonda Kida, MD;  [facility]: [facility];  Service: Orthopedics;  Laterality: Right;   SHOULDER SURGERY Right    TOTAL HIP ARTHROPLASTY Right    TOTAL HIP ARTHROPLASTY Left 08/27/2014   Procedure: LEFT TOTAL HIP ARTHROPLASTY ANTERIOR APPROACH;  Surgeon: Durene Romans, MD;   Location: [facility] ORS;  Service: Orthopedics;  Laterality: Left;   XI ROBOTIC ASSISTED INGUINAL HERNIA REPAIR WITH MESH Right 05/12/2022   Procedure: XI ROBOTIC ASSISTED INGUINAL HERNIA REPAIR WITH MESH, RIGHT;  Surgeon: Franky Macho, MD;  Location: [facility];  Service: General;  Laterality: Right;    SOCIAL HISTORY:  Social History   Socioeconomic History   Marital status: Married    Spouse name: Not on file   Number of children: Not on file   Years of education: Not on file   Highest education level: Not on file  Occupational History   Not on file  Tobacco Use   Smoking status: Every Day    Current packs/day: 1.00    Average packs/day: 1 pack/day for 32.0 years (32.0 ttl pk-yrs)    Types: Cigarettes    Passive exposure: Current   Smokeless tobacco: Never  Vaping Use   Vaping status: Never Used  Substance and Sexual Activity   Alcohol use: Not Currently    Alcohol/week: 16.0 standard drinks of alcohol    Types: 6 Glasses of wine, 10 Cans of beer per week    Comment: None currently (last ETOH 03/01/2018); previously drinks 3 to 4 beers daily and 1 glass of wine; "way more" on the weekend   Drug use: Yes    Types: Marijuana    Comment: hx of marijuana use years ago    Sexual activity: Not Currently  Other Topics Concern   Not on file  Social History Narrative   Not on file   Social Drivers of Health   Financial Resource Strain: Not on file  Food Insecurity: No Food Insecurity (06/01/2022)   Hunger Vital Sign    Worried About Running Out of Food in the Last Year: Never true    Ran Out of Food in the Last Year: Never true  Transportation Needs: No Transportation Needs (06/01/2022)   PRAPARE - Administrator, Civil Service (Medical): No    Lack of Transportation (Non-Medical): No  Physical Activity: Not on file  Stress: Not on file  Social Connections: Not on file  Intimate Partner Violence: Not At Risk (06/01/2022)   Humiliation, Afraid, Rape, and Kick  questionnaire    Fear of Current or Ex-Partner: No    Emotionally Abused: No    Physically Abused: No    Sexually Abused: No    FAMILY HISTORY:  Family History  Problem Relation Age of Onset   Heart attack Mother    Colon cancer Neg Hx     CURRENT MEDICATIONS:  Outpatient Encounter Medications as of 07/04/2023  Medication Sig   ALPRAZolam (XANAX) 0.5 MG tablet Take 0.5 mg by mouth 2 (two) times daily as needed for anxiety.   amphetamine-dextroamphetamine (ADDERALL) 20 MG tablet Take 20 mg by mouth 3 (three) times daily.   Buprenorphine HCl-Naloxone HCl 8-2 MG FILM Place under the tongue.   folic acid (FOLVITE) 1 MG tablet Take 1 mg by mouth daily.   Golimumab (SIMPONI ARIA IV) Inject 1 Dose into the vein every 8 (eight) weeks.   levothyroxine (SYNTHROID, LEVOTHROID) 25 MCG tablet  Take 25 mcg by mouth daily before breakfast.    Methotrexate Sodium (METHOTREXATE, PF,) 50 MG/2ML injection Inject 0.7 mLs into the muscle once a week.   omeprazole (PRILOSEC) 20 MG capsule Take 20 mg by mouth daily as needed (acid reflux).   rosuvastatin (CRESTOR) 5 MG tablet TAKE 1 TABLET (5 MG TOTAL) BY MOUTH DAILY.   tamsulosin (FLOMAX) 0.4 MG CAPS capsule Take 0.4 mg by mouth daily.   No facility-administered encounter medications on file as of 07/04/2023.    ALLERGIES:  No Known Allergies  LABORATORY DATA:  I have reviewed the labs as listed.  CBC    Component Value Date/Time   WBC 5.5 07/01/2023 1306   RBC 3.52 (L) 07/01/2023 1306   HGB 12.0 (L) 07/01/2023 1306   HGB 12.3 (L) 06/15/2023 1038   HCT 35.4 (L) 07/01/2023 1306   HCT 38.4 06/15/2023 1038   PLT 195 07/01/2023 1306   PLT 191 06/15/2023 1038   MCV 100.6 (H) 07/01/2023 1306   MCV 104 (H) 06/15/2023 1038   MCH 34.1 (H) 07/01/2023 1306   MCHC 33.9 07/01/2023 1306   RDW 13.1 07/01/2023 1306   RDW 12.0 06/15/2023 1038   LYMPHSABS 2.7 07/01/2023 1306   LYMPHSABS 2.2 06/15/2023 1038   MONOABS 0.4 07/01/2023 1306   EOSABS 0.1  07/01/2023 1306   EOSABS 0.1 06/15/2023 1038   BASOSABS 0.1 07/01/2023 1306   BASOSABS 0.1 06/15/2023 1038      Latest Ref Rng & Units 06/24/2023   12:24 PM 06/15/2023   10:38 AM 02/16/2023    8:27 AM  CMP  Glucose 70 - 99 mg/dL 409  86    BUN 6 - 20 mg/dL 17  18    Creatinine 8.11 - 1.24 mg/dL 9.14  7.82    Sodium 956 - 145 mmol/L 139  142    Potassium 3.5 - 5.1 mmol/L 4.0  4.8    Chloride 98 - 111 mmol/L 104  102    CO2 22 - 32 mmol/L 27  27    Calcium 8.9 - 10.3 mg/dL 8.6  9.1    Total Protein 6.5 - 8.1 g/dL 5.5  5.7  5.3   Total Bilirubin 0.0 - 1.2 mg/dL 0.6  0.5  0.6   Alkaline Phos 38 - 126 U/L 77  109    AST 15 - 41 U/L 40  39  56   ALT 0 - 44 U/L 40  33  52     DIAGNOSTIC IMAGING:  I have independently reviewed the relevant imaging and discussed with the patient.   WRAP UP:  All questions were answered. The patient knows to call the clinic with any problems, questions or concerns.  Medical decision making: Low  Time spent on visit: I spent 15 minutes counseling the patient face to face. The total time spent in the appointment was 22 minutes and more than 50% was on counseling.  Carnella Guadalajara, PA-C  07/04/23 3:52 PM

## 2023-07-04 ENCOUNTER — Inpatient Hospital Stay: Payer: 59

## 2023-07-04 ENCOUNTER — Inpatient Hospital Stay (HOSPITAL_BASED_OUTPATIENT_CLINIC_OR_DEPARTMENT_OTHER): Payer: 59 | Admitting: Physician Assistant

## 2023-07-04 NOTE — Progress Notes (Signed)
Howard [name] presents today for phlebotomy per MD orders. Phlebotomy procedure started at 1455 and ended at 1500. 250 cc removed. Patient tolerated procedure well. IV needle removed intact.  Unable to complete 500 ml for phlebotomy.   Patient stated he was ok with the volume taken for today's visit.   Patient tolerated phlebotomy with no complaints voiced.  Peripheral IV site intact with no bruising or swelling noted.  Denied SOB, chest pain, or dizziness.  Gauze with coban applied.  Discharged with VSS and no s/s of distress noted.

## 2023-07-04 NOTE — Patient Instructions (Signed)

## 2023-07-11 ENCOUNTER — Inpatient Hospital Stay: Payer: 59

## 2023-07-13 ENCOUNTER — Inpatient Hospital Stay: Payer: 59

## 2023-07-13 LAB — CBC
HCT: 36.5 % — ABNORMAL LOW (ref 39.0–52.0)
Hemoglobin: 12.6 g/dL — ABNORMAL LOW (ref 13.0–17.0)
MCH: 34.1 pg — ABNORMAL HIGH (ref 26.0–34.0)
MCHC: 34.5 g/dL (ref 30.0–36.0)
MCV: 98.9 fL (ref 80.0–100.0)
Platelets: 168 10*3/uL (ref 150–400)
RBC: 3.69 MIL/uL — ABNORMAL LOW (ref 4.22–5.81)
RDW: 12.6 % (ref 11.5–15.5)
WBC: 5.5 10*3/uL (ref 4.0–10.5)
nRBC: 0 % (ref 0.0–0.2)

## 2023-07-13 NOTE — Progress Notes (Signed)
 Patient presents today for therapeutic phlebotomy.  Patient is in satisfactory condition with no new complaints voiced.  Vital signs are stable.  We will proceed with phlebotomy per provider orders.  Therapeutic phlebotomy started at 1527 and ended at 1535.500 mL of blood removed from left arm. Vital signs remained stable. Patient denies dizziness or lightheadedness.  Coban and gauze applied to site. Patient waited 10 minute post phlebotomy wait time. Patient left ambulatory in stable condition.

## 2023-07-13 NOTE — Patient Instructions (Signed)
 [facility] [place] - [facility]  Discharge Instructions: Thank you for choosing [facility] to provide your oncology and hematology care.  If you have a lab appointment with the Cancer Center - please note that after April 8th, 2024, all labs will be drawn in the cancer center.  You do not have to check in or register with the main entrance as you have in the past but will complete your check-in in the cancer center.  Wear comfortable clothing and clothing appropriate for easy access to any Portacath or PICC line.   We strive to give you quality time with your provider. You may need to reschedule your appointment if you arrive late (15 or more minutes).  Arriving late affects you and other patients whose appointments are after yours.  Also, if you miss three or more appointments without notifying the office, you may be dismissed from the clinic at the provider's discretion.      For prescription refill requests, have your pharmacy contact our office and allow 72 hours for refills to be completed.    Today you received therapeutic phlebotomy      BELOW ARE SYMPTOMS THAT SHOULD BE REPORTED IMMEDIATELY: *FEVER GREATER THAN 100.4 F (38 C) OR HIGHER *CHILLS OR SWEATING *NAUSEA AND VOMITING THAT IS NOT CONTROLLED WITH YOUR NAUSEA MEDICATION *UNUSUAL SHORTNESS OF BREATH *UNUSUAL BRUISING OR BLEEDING *URINARY PROBLEMS (pain or burning when urinating, or frequent urination) *BOWEL PROBLEMS (unusual diarrhea, constipation, pain near the anus) TENDERNESS IN MOUTH AND THROAT WITH OR WITHOUT PRESENCE OF ULCERS (sore throat, sores in mouth, or a toothache) UNUSUAL RASH, SWELLING OR PAIN  UNUSUAL VAGINAL DISCHARGE OR ITCHING   Items with * indicate a potential emergency and should be followed up as soon as possible or go to the Emergency Department if any problems should occur.  Please show the CHEMOTHERAPY ALERT CARD or IMMUNOTHERAPY ALERT CARD at  check-in to the Emergency Department and triage nurse.  Should you have questions after your visit or need to cancel or reschedule your appointment, please contact [facility] CANCER CTR [place] - A DEPT OF Eligha Bridegroom [facility] [phone]  and follow the prompts.  Office hours are 8:00 a.m. to 4:30 p.m. Monday - Friday. Please note that voicemails left after 4:00 p.m. may not be returned until the following business day.  We are closed weekends and major holidays. You have access to a nurse at all times for urgent questions. Please call the main number to the clinic [phone] and follow the prompts.  For any non-urgent questions, you may also contact your provider using MyChart. We now offer e-Visits for anyone 70 and older to request care online for non-urgent symptoms. For details visit [email or web address].   Also download the MyChart app! Go to the app store, search "MyChart", open the app, select [place], and log in with your MyChart username and password.

## 2023-07-18 ENCOUNTER — Inpatient Hospital Stay: Payer: 59

## 2023-07-20 ENCOUNTER — Inpatient Hospital Stay: Payer: 59 | Attending: Hematology

## 2023-07-20 ENCOUNTER — Inpatient Hospital Stay: Payer: 59

## 2023-07-20 DIAGNOSIS — Z79899 Other long term (current) drug therapy: Secondary | ICD-10-CM | POA: Diagnosis not present

## 2023-07-20 LAB — CBC
HCT: 36.1 % — ABNORMAL LOW (ref 39.0–52.0)
Hemoglobin: 12 g/dL — ABNORMAL LOW (ref 13.0–17.0)
MCH: 33.4 pg (ref 26.0–34.0)
MCHC: 33.2 g/dL (ref 30.0–36.0)
MCV: 100.6 fL — ABNORMAL HIGH (ref 80.0–100.0)
Platelets: 178 10*3/uL (ref 150–400)
RBC: 3.59 MIL/uL — ABNORMAL LOW (ref 4.22–5.81)
RDW: 12.8 % (ref 11.5–15.5)
WBC: 5.2 10*3/uL (ref 4.0–10.5)
nRBC: 0 % (ref 0.0–0.2)

## 2023-07-20 NOTE — Progress Notes (Signed)
 Patient presents today for therapeutic phlebotomy.  Patient is in satisfactory condition with no new complaints voiced.  Vital signs are stable.  We will proceed with phlebotomy per provider orders.  Therapeutic phlebotomy started at 1446 and ended at 1453.  500 mL of blood removed from L arm.  Vital signs remained stable.  Patient denies dizziness or lightheadedness.  Coban and gauze applied to site. Patient refused to wait the recommended 30 minute post phlebotomy wait time.  Patient left ambulatory in stable condition.

## 2023-07-25 ENCOUNTER — Inpatient Hospital Stay: Payer: 59

## 2023-07-27 ENCOUNTER — Inpatient Hospital Stay: Payer: 59

## 2023-07-27 LAB — CBC
HCT: 32.9 % — ABNORMAL LOW (ref 39.0–52.0)
Hemoglobin: 11.2 g/dL — ABNORMAL LOW (ref 13.0–17.0)
MCH: 33.7 pg (ref 26.0–34.0)
MCHC: 34 g/dL (ref 30.0–36.0)
MCV: 99.1 fL (ref 80.0–100.0)
Platelets: 158 10*3/uL (ref 150–400)
RBC: 3.32 MIL/uL — ABNORMAL LOW (ref 4.22–5.81)
RDW: 13 % (ref 11.5–15.5)
WBC: 5.6 10*3/uL (ref 4.0–10.5)
nRBC: 0 % (ref 0.0–0.2)

## 2023-07-27 NOTE — Patient Instructions (Signed)

## 2023-07-27 NOTE — Progress Notes (Signed)
 Patient presents today for therapeutic phlebotomy.  Patient is in satisfactory condition with no new complaints voiced.  Vital signs are stable.  We will proceed with phlebotomy per provider orders.  Therapeutic phlebotomy started at 1503 and ended at 1511.  500 mL of blood removed from L arm.  Vital signs remained stable.  Patient denies dizziness or lightheadedness.  Coban and gauze applied to site. Patient refused to wait the recommended 30 minute post phlebotomy wait time. Patient left ambulatory in stable condition.

## 2023-08-01 ENCOUNTER — Inpatient Hospital Stay: Payer: 59

## 2023-08-03 ENCOUNTER — Inpatient Hospital Stay: Payer: 59

## 2023-08-03 LAB — CBC
HCT: 32.7 % — ABNORMAL LOW (ref 39.0–52.0)
Hemoglobin: 11 g/dL — ABNORMAL LOW (ref 13.0–17.0)
MCH: 34.1 pg — ABNORMAL HIGH (ref 26.0–34.0)
MCHC: 33.6 g/dL (ref 30.0–36.0)
MCV: 101.2 fL — ABNORMAL HIGH (ref 80.0–100.0)
Platelets: 165 10*3/uL (ref 150–400)
RBC: 3.23 MIL/uL — ABNORMAL LOW (ref 4.22–5.81)
RDW: 13.4 % (ref 11.5–15.5)
WBC: 5.5 10*3/uL (ref 4.0–10.5)
nRBC: 0 % (ref 0.0–0.2)

## 2023-08-03 NOTE — Progress Notes (Signed)
 Hemoglobin is 11.0 today, no phlebotomy per provider note.

## 2023-08-08 ENCOUNTER — Inpatient Hospital Stay: Payer: 59

## 2023-08-10 ENCOUNTER — Inpatient Hospital Stay: Payer: 59

## 2023-08-12 ENCOUNTER — Inpatient Hospital Stay

## 2023-08-12 LAB — CBC
HCT: 36.1 % — ABNORMAL LOW (ref 39.0–52.0)
Hemoglobin: 12.3 g/dL — ABNORMAL LOW (ref 13.0–17.0)
MCH: 34.1 pg — ABNORMAL HIGH (ref 26.0–34.0)
MCHC: 34.1 g/dL (ref 30.0–36.0)
MCV: 100 fL (ref 80.0–100.0)
Platelets: 165 10*3/uL (ref 150–400)
RBC: 3.61 MIL/uL — ABNORMAL LOW (ref 4.22–5.81)
RDW: 13.1 % (ref 11.5–15.5)
WBC: 4.1 10*3/uL (ref 4.0–10.5)
nRBC: 0 % (ref 0.0–0.2)

## 2023-08-12 NOTE — Progress Notes (Signed)
 Patient here for therapeutic phlebotomy per providers order.  Vital signs WNL.  Patient has no new complaints today.  Hgb 12.5.  Started phlebotomy,  patient complaining of pain at the insertion site and wanted the needle out.  No swelling or discoloration at this site.  Patient opting skip phlebotomy this week. Discharge from clinic ambulatory in stable condition.  Alert and oriented X 3.  Follow up with [facility] as scheduled.

## 2023-08-15 ENCOUNTER — Inpatient Hospital Stay: Payer: 59

## 2023-08-17 ENCOUNTER — Inpatient Hospital Stay: Payer: 59

## 2023-08-19 ENCOUNTER — Inpatient Hospital Stay

## 2023-08-19 ENCOUNTER — Inpatient Hospital Stay: Attending: Hematology

## 2023-08-19 LAB — CBC
HCT: 35.5 % — ABNORMAL LOW (ref 39.0–52.0)
Hemoglobin: 12.1 g/dL — ABNORMAL LOW (ref 13.0–17.0)
MCH: 34 pg (ref 26.0–34.0)
MCHC: 34.1 g/dL (ref 30.0–36.0)
MCV: 99.7 fL (ref 80.0–100.0)
Platelets: 153 10*3/uL (ref 150–400)
RBC: 3.56 MIL/uL — ABNORMAL LOW (ref 4.22–5.81)
RDW: 12.8 % (ref 11.5–15.5)
WBC: 4.5 10*3/uL (ref 4.0–10.5)
nRBC: 0 % (ref 0.0–0.2)

## 2023-08-19 NOTE — Patient Instructions (Signed)
 [facility] [place] - [facility]  Discharge Instructions: Thank you for choosing [facility] to provide your oncology and hematology care.  If you have a lab appointment with the Cancer Center - please note that after April 8th, 2024, all labs will be drawn in the cancer center.  You do not have to check in or register with the main entrance as you have in the past but will complete your check-in in the cancer center.  Wear comfortable clothing and clothing appropriate for easy access to any Portacath or PICC line.   We strive to give you quality time with your provider. You may need to reschedule your appointment if you arrive late (15 or more minutes).  Arriving late affects you and other patients whose appointments are after yours.  Also, if you miss three or more appointments without notifying the office, you may be dismissed from the clinic at the provider's discretion.      For prescription refill requests, have your pharmacy contact our office and allow 72 hours for refills to be completed.    Today you received the following chemotherapy and/or immunotherapy agents phlebotomy. Therapeutic Phlebotomy, Care After The following information offers guidance on how to care for yourself after your procedure. Your health care provider may also give you more specific instructions. If you have problems or questions, contact your health care provider. What can I expect after the procedure? After therapeutic phlebotomy, it is common to have: Light-headedness or dizziness. You may feel faint. Nausea. Tiredness (fatigue). Follow these instructions at home: Eating and drinking Be sure to eat well-balanced meals for the next 24 hours. Drink enough fluid to keep your urine pale yellow. Avoid drinking alcohol on the day that you had the procedure. Activity  Return to your normal activities as told by your health care provider. Most people can go back  to their normal activities right away. Avoid activities that take a lot of effort for about 5 hours after the procedure. Athletes should avoid strenuous exercise for at least 12 hours. Avoid heavy lifting or pulling for about 5 hours after the procedure. Do not lift anything that is heavier than 10 lb (4.5 kg). Change positions slowly for the remainder of the day, like from sitting to standing. This can help prevent light-headedness or fainting. If you feel light-headed, lie down until the feeling goes away. Needle insertion site care  Keep your bandage (dressing) dry. You can remove the bandage after about 5 hours or as told by your health care provider. If you have bleeding from the needle insertion site, raise (elevate) your arm and press firmly on the site until the bleeding stops. If you have bruising at the site, apply ice to the area. To do this: Put ice in a plastic bag. Place a towel between your skin and the bag. Leave the ice on for 20 minutes, 2-3 times a day for the first 24 hours. Remove the ice if your skin turns bright red so you do not damage the area. If the swelling does not go away after 24 hours, apply a warm, moist cloth (warm compress) to the area for 20 minutes, 2-3 times a day. General instructions Do not use any products that contain nicotine or tobacco, like cigarettes, chewing tobacco, and vaping devices, such as e-cigarettes, for at least 30 minutes after the procedure. If you need help quitting, ask your health care provider. Keep all follow-up visits.  You may need to continue having regular blood tests and therapeutic phlebotomy treatments as directed. Contact a health care provider if: You have redness, swelling, or pain at the needle insertion site. Fluid or blood is coming from the needle insertion site. Pus or a bad smell is coming from the needle insertion site. The needle insertion site feels warm to the touch. You feel light-headed, dizzy, or nauseous, and  the feeling does not go away. You have new bruising at the needle insertion site. You feel weaker than normal. You have a fever or chills. Get help right away if: You have chest pain. You have trouble breathing. You have severe nausea or vomiting. Summary After the procedure, it is common to have some light-headedness, dizziness, nausea, or tiredness (fatigue). Be sure to eat well-balanced meals for the next 24 hours. Drink enough fluid to keep your urine pale yellow. Return to your normal activities as told by your health care provider. Keep all follow-up visits. You may need to continue having regular blood tests and therapeutic phlebotomy treatments as directed. This information is not intended to replace advice given to you by your health care provider. Make sure you discuss any questions you have with your health care provider. Document Revised: 10/19/2022 Document Reviewed: 10/29/2020 Elsevier Patient Education  2024 [employer]vier Inc.      To help prevent nausea and vomiting after your treatment, we encourage you to take your nausea medication as directed.  BELOW ARE SYMPTOMS THAT SHOULD BE REPORTED IMMEDIATELY: *FEVER GREATER THAN 100.4 F (38 C) OR HIGHER *CHILLS OR SWEATING *NAUSEA AND VOMITING THAT IS NOT CONTROLLED WITH YOUR NAUSEA MEDICATION *UNUSUAL SHORTNESS OF BREATH *UNUSUAL BRUISING OR BLEEDING *URINARY PROBLEMS (pain or burning when urinating, or frequent urination) *BOWEL PROBLEMS (unusual diarrhea, constipation, pain near the anus) TENDERNESS IN MOUTH AND THROAT WITH OR WITHOUT PRESENCE OF ULCERS (sore throat, sores in mouth, or a toothache) UNUSUAL RASH, SWELLING OR PAIN  UNUSUAL VAGINAL DISCHARGE OR ITCHING   Items with * indicate a potential emergency and should be followed up as soon as possible or go to the Emergency Department if any problems should occur.  Please show the CHEMOTHERAPY ALERT CARD or IMMUNOTHERAPY ALERT CARD at check-in to the Emergency  Department and triage nurse.  Should you have questions after your visit or need to cancel or reschedule your appointment, please contact [facility] CANCER CTR [place] - A DEPT OF Eligha Bridegroom [facility] [phone]  and follow the prompts.  Office hours are 8:00 a.m. to 4:30 p.m. Monday - Friday. Please note that voicemails left after 4:00 p.m. may not be returned until the following business day.  We are closed weekends and major holidays. You have access to a nurse at all times for urgent questions. Please call the main number to the clinic [phone] and follow the prompts.  For any non-urgent questions, you may also contact your provider using MyChart. We now offer e-Visits for anyone 28 and older to request care online for non-urgent symptoms. For details visit [email or web address].   Also download the MyChart app! Go to the app store, search "MyChart", open the app, select [place], and log in with your MyChart username and password.

## 2023-08-19 NOTE — Progress Notes (Signed)
 Howard [name] presents for therapeutic phlebotomy per MD orders. Last HGB 12.1 / HCT 35.5 on 11:18 am . Vital signs stable prior to procedure. Procedure started at 11:22 am and ended at 11:35. 500 mls of blood removed. Patient denies any dizziness , lightheadedness, or feeling faint.  Gauze and coban applied to site. Vital signs stable at completion of procedure. Patient has no complaints at this time. Alert and oriented x 3.  Discharged from clinic ambulatory in stable condition. Alert and oriented x 3. F/U with [facility] as scheduled.

## 2023-08-22 ENCOUNTER — Inpatient Hospital Stay: Payer: 59

## 2023-08-24 ENCOUNTER — Inpatient Hospital Stay: Payer: 59

## 2023-08-26 ENCOUNTER — Inpatient Hospital Stay

## 2023-08-26 LAB — CBC
HCT: 35.9 % — ABNORMAL LOW (ref 39.0–52.0)
Hemoglobin: 12.1 g/dL — ABNORMAL LOW (ref 13.0–17.0)
MCH: 33.7 pg (ref 26.0–34.0)
MCHC: 33.7 g/dL (ref 30.0–36.0)
MCV: 100 fL (ref 80.0–100.0)
Platelets: 171 10*3/uL (ref 150–400)
RBC: 3.59 MIL/uL — ABNORMAL LOW (ref 4.22–5.81)
RDW: 13.2 % (ref 11.5–15.5)
WBC: 8.3 10*3/uL (ref 4.0–10.5)
nRBC: 0 % (ref 0.0–0.2)

## 2023-08-26 NOTE — Patient Instructions (Signed)
 [facility] [place] [facility]  Discharge Instructions: Thank you for choosing [facility] to provide your oncology and hematology care.  If you have a lab appointment with the Cancer Center - please note that after April 8th, 2024, all labs will be drawn in the cancer center.  You do not have to check in or register with the main entrance as you have in the past but will complete your check-in in the cancer center.  Wear comfortable clothing and clothing appropriate for easy access to any Portacath or PICC line.   We strive to give you quality time with your provider. You may need to reschedule your appointment if you arrive late (15 or more minutes).  Arriving late affects you and other patients whose appointments are after yours.  Also, if you miss three or more appointments without notifying the office, you may be dismissed from the clinic at the provider's discretion.      For prescription refill requests, have your pharmacy contact our office and allow 72 hours for refills to be completed.    Today you received the following chemotherapy and/or immunotherapy agents Phlebotomy      To help prevent nausea and vomiting after your treatment, we encourage you to take your nausea medication as directed.  BELOW ARE SYMPTOMS THAT SHOULD BE REPORTED IMMEDIATELY: *FEVER GREATER THAN 100.4 F (38 C) OR HIGHER *CHILLS OR SWEATING *NAUSEA AND VOMITING THAT IS NOT CONTROLLED WITH YOUR NAUSEA MEDICATION *UNUSUAL SHORTNESS OF BREATH *UNUSUAL BRUISING OR BLEEDING *URINARY PROBLEMS (pain or burning when urinating, or frequent urination) *BOWEL PROBLEMS (unusual diarrhea, constipation, pain near the anus) TENDERNESS IN MOUTH AND THROAT WITH OR WITHOUT PRESENCE OF ULCERS (sore throat, sores in mouth, or a toothache) UNUSUAL RASH, SWELLING OR PAIN  UNUSUAL VAGINAL DISCHARGE OR ITCHING   Items with * indicate a potential emergency and should be followed  up as soon as possible or go to the Emergency Department if any problems should occur.  Please show the CHEMOTHERAPY ALERT CARD or IMMUNOTHERAPY ALERT CARD at check-in to the Emergency Department and triage nurse.  Should you have questions after your visit or need to cancel or reschedule your appointment, please contact [facility] CANCER CTR [place] - A DEPT OF Eligha Bridegroom [facility] [phone]  and follow the prompts.  Office hours are 8:00 a.m. to 4:30 p.m. Monday - Friday. Please note that voicemails left after 4:00 p.m. may not be returned until the following business day.  We are closed weekends and major holidays. You have access to a nurse at all times for urgent questions. Please call the main number to the clinic [phone] and follow the prompts.  For any non-urgent questions, you may also contact your provider using MyChart. We now offer e-Visits for anyone 11 and older to request care online for non-urgent symptoms. For details visit [email or web address].   Also download the MyChart app! Go to the app store, search "MyChart", open the app, select [place], and log in with your MyChart username and password.

## 2023-08-26 NOTE — Progress Notes (Signed)
 Patient presents today for phlebotomy per MD orders. Phlebotomy procedure started at 1002 and ended at 1015. 500 cc removed. Patient tolerated procedure well. Procedure tolerated well and without incident. Discharged ambulatory in stable condition.

## 2023-08-29 ENCOUNTER — Inpatient Hospital Stay: Payer: 59

## 2023-08-31 ENCOUNTER — Inpatient Hospital Stay: Payer: 59

## 2023-09-02 ENCOUNTER — Inpatient Hospital Stay

## 2023-09-02 LAB — CBC
HCT: 35.4 % — ABNORMAL LOW (ref 39.0–52.0)
Hemoglobin: 12.1 g/dL — ABNORMAL LOW (ref 13.0–17.0)
MCH: 34.3 pg — ABNORMAL HIGH (ref 26.0–34.0)
MCHC: 34.2 g/dL (ref 30.0–36.0)
MCV: 100.3 fL — ABNORMAL HIGH (ref 80.0–100.0)
Platelets: 189 10*3/uL (ref 150–400)
RBC: 3.53 MIL/uL — ABNORMAL LOW (ref 4.22–5.81)
RDW: 13.2 % (ref 11.5–15.5)
WBC: 4.8 10*3/uL (ref 4.0–10.5)
nRBC: 0 % (ref 0.0–0.2)

## 2023-09-02 NOTE — Progress Notes (Signed)
 Howard [name] presents today for theraputic phlebotomy per MD orders. Last hgb 12.1/hct 35.4 was on 09/02/23. VSS prior to procedure. Pt reports eating before arrival. Procedure started at 1135 using patients left AC. 500 mL of blood removed. Procedure ended at 1149. Gauze and coban applied to [facility], site clean and dry. VSS upon completion of procedure. Pt denies dizziness, lightheadedness, or feeling faint. Observed for 15 minutes post procedure. Discharged in satisfactory condition with follow up instructions.

## 2023-09-02 NOTE — Patient Instructions (Signed)
 [facility] [place] - [facility]  Discharge Instructions: Thank you for choosing [facility] to provide your oncology and hematology care.  If you have a lab appointment with the Cancer Center - please note that after April 8th, 2024, all labs will be drawn in the cancer center.  You do not have to check in or register with the main entrance as you have in the past but will complete your check-in in the cancer center.  Wear comfortable clothing and clothing appropriate for easy access to any Portacath or PICC line.   We strive to give you quality time with your provider. You may need to reschedule your appointment if you arrive late (15 or more minutes).  Arriving late affects you and other patients whose appointments are after yours.  Also, if you miss three or more appointments without notifying the office, you may be dismissed from the clinic at the provider's discretion.      For prescription refill requests, have your pharmacy contact our office and allow 72 hours for refills to be completed.    Today you received therapeutic phlebotomy      BELOW ARE SYMPTOMS THAT SHOULD BE REPORTED IMMEDIATELY: *FEVER GREATER THAN 100.4 F (38 C) OR HIGHER *CHILLS OR SWEATING *NAUSEA AND VOMITING THAT IS NOT CONTROLLED WITH YOUR NAUSEA MEDICATION *UNUSUAL SHORTNESS OF BREATH *UNUSUAL BRUISING OR BLEEDING *URINARY PROBLEMS (pain or burning when urinating, or frequent urination) *BOWEL PROBLEMS (unusual diarrhea, constipation, pain near the anus) TENDERNESS IN MOUTH AND THROAT WITH OR WITHOUT PRESENCE OF ULCERS (sore throat, sores in mouth, or a toothache) UNUSUAL RASH, SWELLING OR PAIN  UNUSUAL VAGINAL DISCHARGE OR ITCHING   Items with * indicate a potential emergency and should be followed up as soon as possible or go to the Emergency Department if any problems should occur.  Please show the CHEMOTHERAPY ALERT CARD or IMMUNOTHERAPY ALERT CARD at  check-in to the Emergency Department and triage nurse.  Should you have questions after your visit or need to cancel or reschedule your appointment, please contact [facility] CANCER CTR [place] - A DEPT OF Eligha Bridegroom [facility] [phone]  and follow the prompts.  Office hours are 8:00 a.m. to 4:30 p.m. Monday - Friday. Please note that voicemails left after 4:00 p.m. may not be returned until the following business day.  We are closed weekends and major holidays. You have access to a nurse at all times for urgent questions. Please call the main number to the clinic [phone] and follow the prompts.  For any non-urgent questions, you may also contact your provider using MyChart. We now offer e-Visits for anyone 70 and older to request care online for non-urgent symptoms. For details visit [email or web address].   Also download the MyChart app! Go to the app store, search "MyChart", open the app, select [place], and log in with your MyChart username and password.

## 2023-09-05 ENCOUNTER — Inpatient Hospital Stay: Payer: 59

## 2023-09-07 ENCOUNTER — Inpatient Hospital Stay: Payer: 59

## 2023-09-09 ENCOUNTER — Inpatient Hospital Stay

## 2023-09-09 LAB — CBC
HCT: 36.1 % — ABNORMAL LOW (ref 39.0–52.0)
Hemoglobin: 12 g/dL — ABNORMAL LOW (ref 13.0–17.0)
MCH: 33.4 pg (ref 26.0–34.0)
MCHC: 33.2 g/dL (ref 30.0–36.0)
MCV: 100.6 fL — ABNORMAL HIGH (ref 80.0–100.0)
Platelets: 183 10*3/uL (ref 150–400)
RBC: 3.59 MIL/uL — ABNORMAL LOW (ref 4.22–5.81)
RDW: 13.7 % (ref 11.5–15.5)
WBC: 7.2 10*3/uL (ref 4.0–10.5)
nRBC: 0 % (ref 0.0–0.2)

## 2023-09-09 NOTE — Progress Notes (Signed)
 Howard [name] presents today for phlebotomy per MD orders. Phlebotomy procedure started in left AC at 1243 and ended at 1247. 500 cc removed. Patient tolerated procedure well. IV needle removed intact.

## 2023-09-09 NOTE — Patient Instructions (Signed)
 [facility] [place] - A DEPT OF [place]  Discharge Instructions: Thank you for choosing [facility] to provide your oncology and hematology care.  If you have a lab appointment with the Cancer Center - please note that after April 8th, 2024, all labs will be drawn in the cancer center.  You do not have to check in or register with the main entrance as you have in the past but will complete your check-in in the cancer center.  Wear comfortable clothing and clothing appropriate for easy access to any Portacath or PICC line.   We strive to give you quality time with your provider. You may need to reschedule your appointment if you arrive late (15 or more minutes).  Arriving late affects you and other patients whose appointments are after yours.  Also, if you miss three or more appointments without notifying the office, you may be dismissed from the clinic at the provider's discretion.      For prescription refill requests, have your pharmacy contact our office and allow 72 hours for refills to be completed.    Today you received the following Therapeutic Phlebotomy, return as scheduled.   To help prevent nausea and vomiting after your treatment, we encourage you to take your nausea medication as directed.  BELOW ARE SYMPTOMS THAT SHOULD BE REPORTED IMMEDIATELY: *FEVER GREATER THAN 100.4 F (38 C) OR HIGHER *CHILLS OR SWEATING *NAUSEA AND VOMITING THAT IS NOT CONTROLLED WITH YOUR NAUSEA MEDICATION *UNUSUAL SHORTNESS OF BREATH *UNUSUAL BRUISING OR BLEEDING *URINARY PROBLEMS (pain or burning when urinating, or frequent urination) *BOWEL PROBLEMS (unusual diarrhea, constipation, pain near the anus) TENDERNESS IN MOUTH AND THROAT WITH OR WITHOUT PRESENCE OF ULCERS (sore throat, sores in mouth, or a toothache) UNUSUAL RASH, SWELLING OR PAIN  UNUSUAL VAGINAL DISCHARGE OR ITCHING   Items with * indicate a potential emergency and should be followed up as soon  as possible or go to the Emergency Department if any problems should occur.  Please show the CHEMOTHERAPY ALERT CARD or IMMUNOTHERAPY ALERT CARD at check-in to the Emergency Department and triage nurse.  Should you have questions after your visit or need to cancel or reschedule your appointment, please contact [facility] CANCER CTR [place] - A DEPT OF [facility] [phone]  and follow the prompts.  Office hours are 8:00 a.m. to 4:30 p.m. Monday - Friday. Please note that voicemails left after 4:00 p.m. may not be returned until the following business day.  We are closed weekends and major holidays. You have access to a nurse at all times for urgent questions. Please call the main number to the clinic [phone] and follow the prompts.  For any non-urgent questions, you may also contact your provider using MyChart. We now offer e-Visits for anyone 47 and older to request care online for non-urgent symptoms. For details visit [email or web address].   Also download the MyChart app! Go to the app store, search "MyChart", open the app, select [place], and log in with your MyChart username and password.

## 2023-09-12 ENCOUNTER — Inpatient Hospital Stay: Payer: 59

## 2023-09-14 ENCOUNTER — Inpatient Hospital Stay: Payer: 59

## 2023-09-16 ENCOUNTER — Inpatient Hospital Stay

## 2023-09-19 ENCOUNTER — Inpatient Hospital Stay: Payer: 59

## 2023-09-21 ENCOUNTER — Inpatient Hospital Stay: Payer: 59

## 2023-09-23 ENCOUNTER — Inpatient Hospital Stay

## 2023-09-23 ENCOUNTER — Inpatient Hospital Stay: Attending: Hematology

## 2023-09-23 DIAGNOSIS — D539 Nutritional anemia, unspecified: Secondary | ICD-10-CM | POA: Diagnosis not present

## 2023-09-23 LAB — CBC
HCT: 37.1 % — ABNORMAL LOW (ref 39.0–52.0)
Hemoglobin: 12.3 g/dL — ABNORMAL LOW (ref 13.0–17.0)
MCH: 33.6 pg (ref 26.0–34.0)
MCHC: 33.2 g/dL (ref 30.0–36.0)
MCV: 101.4 fL — ABNORMAL HIGH (ref 80.0–100.0)
Platelets: 186 10*3/uL (ref 150–400)
RBC: 3.66 MIL/uL — ABNORMAL LOW (ref 4.22–5.81)
RDW: 13.7 % (ref 11.5–15.5)
WBC: 5.7 10*3/uL (ref 4.0–10.5)
nRBC: 0 % (ref 0.0–0.2)

## 2023-09-23 NOTE — Patient Instructions (Signed)
 [facility] [place] [facility]  Discharge Instructions: Thank you for choosing [facility] to provide your oncology and hematology care.  If you have a lab appointment with the Cancer Center - please note that after April 8th, 2024, all labs will be drawn in the cancer center.  You do not have to check in or register with the main entrance as you have in the past but will complete your check-in in the cancer center.  Wear comfortable clothing and clothing appropriate for easy access to any Portacath or PICC line.   We strive to give you quality time with your provider. You may need to reschedule your appointment if you arrive late (15 or more minutes).  Arriving late affects you and other patients whose appointments are after yours.  Also, if you miss three or more appointments without notifying the office, you may be dismissed from the clinic at the provider's discretion.      For prescription refill requests, have your pharmacy contact our office and allow 72 hours for refills to be completed.    Today you received the following phlebotomy, return as scheduled.     To help prevent nausea and vomiting after your treatment, we encourage you to take your nausea medication as directed.  BELOW ARE SYMPTOMS THAT SHOULD BE REPORTED IMMEDIATELY: *FEVER GREATER THAN 100.4 F (38 C) OR HIGHER *CHILLS OR SWEATING *NAUSEA AND VOMITING THAT IS NOT CONTROLLED WITH YOUR NAUSEA MEDICATION *UNUSUAL SHORTNESS OF BREATH *UNUSUAL BRUISING OR BLEEDING *URINARY PROBLEMS (pain or burning when urinating, or frequent urination) *BOWEL PROBLEMS (unusual diarrhea, constipation, pain near the anus) TENDERNESS IN MOUTH AND THROAT WITH OR WITHOUT PRESENCE OF ULCERS (sore throat, sores in mouth, or a toothache) UNUSUAL RASH, SWELLING OR PAIN  UNUSUAL VAGINAL DISCHARGE OR ITCHING   Items with * indicate a potential emergency and should be followed up as soon as  possible or go to the Emergency Department if any problems should occur.  Please show the CHEMOTHERAPY ALERT CARD or IMMUNOTHERAPY ALERT CARD at check-in to the Emergency Department and triage nurse.  Should you have questions after your visit or need to cancel or reschedule your appointment, please contact [facility] CANCER CTR [place] - A DEPT OF Eligha Bridegroom [facility] [phone]  and follow the prompts.  Office hours are 8:00 a.m. to 4:30 p.m. Monday - Friday. Please note that voicemails left after 4:00 p.m. may not be returned until the following business day.  We are closed weekends and major holidays. You have access to a nurse at all times for urgent questions. Please call the main number to the clinic [phone] and follow the prompts.  For any non-urgent questions, you may also contact your provider using MyChart. We now offer e-Visits for anyone 23 and older to request care online for non-urgent symptoms. For details visit [email or web address].   Also download the MyChart app! Go to the app store, search "MyChart", open the app, select [place], and log in with your MyChart username and password.

## 2023-09-23 NOTE — Progress Notes (Signed)
 Howard [name] presents today for phlebotomy per MD orders. Phlebotomy procedure started at 1050 and ended at 1055. 500 cc removed. Patient tolerated procedure well. IV needle removed intact.

## 2023-09-26 ENCOUNTER — Inpatient Hospital Stay: Payer: 59

## 2023-09-28 ENCOUNTER — Inpatient Hospital Stay: Payer: 59

## 2023-09-30 ENCOUNTER — Inpatient Hospital Stay

## 2023-09-30 LAB — COMPREHENSIVE METABOLIC PANEL WITH GFR
ALT: 31 U/L (ref 0–44)
AST: 34 U/L (ref 15–41)
Albumin: 4 g/dL (ref 3.5–5.0)
Alkaline Phosphatase: 90 U/L (ref 38–126)
Anion gap: 7 (ref 5–15)
BUN: 18 mg/dL (ref 6–20)
CO2: 26 mmol/L (ref 22–32)
Calcium: 8.7 mg/dL — ABNORMAL LOW (ref 8.9–10.3)
Chloride: 98 mmol/L (ref 98–111)
Creatinine, Ser: 0.68 mg/dL (ref 0.61–1.24)
GFR, Estimated: >60 mL/min (ref >60)
Glucose, Bld: 114 mg/dL — ABNORMAL HIGH (ref 70–99)
Potassium: 4.1 mmol/L (ref 3.5–5.1)
Sodium: 131 mmol/L — ABNORMAL LOW (ref 135–145)
Total Bilirubin: 1.2 mg/dL (ref 0.0–1.2)
Total Protein: 6 g/dL — ABNORMAL LOW (ref 6.5–8.1)

## 2023-09-30 LAB — CBC WITH DIFFERENTIAL/PLATELET
Abs Immature Granulocytes: 0 10*3/uL (ref 0.00–0.07)
Basophils Absolute: 0 10*3/uL (ref 0.0–0.1)
Basophils Relative: 1 %
Eosinophils Absolute: 0 10*3/uL (ref 0.0–0.5)
Eosinophils Relative: 0 %
HCT: 35 % — ABNORMAL LOW (ref 39.0–52.0)
Hemoglobin: 11.7 g/dL — ABNORMAL LOW (ref 13.0–17.0)
Immature Granulocytes: 0 %
Lymphocytes Relative: 51 %
Lymphs Abs: 2.6 10*3/uL (ref 0.7–4.0)
MCH: 33.6 pg (ref 26.0–34.0)
MCHC: 33.4 g/dL (ref 30.0–36.0)
MCV: 100.6 fL — ABNORMAL HIGH (ref 80.0–100.0)
Monocytes Absolute: 0.5 10*3/uL (ref 0.1–1.0)
Monocytes Relative: 9 %
Neutro Abs: 2 10*3/uL (ref 1.7–7.7)
Neutrophils Relative %: 39 %
Platelets: 191 10*3/uL (ref 150–400)
RBC: 3.48 MIL/uL — ABNORMAL LOW (ref 4.22–5.81)
RDW: 13.3 % (ref 11.5–15.5)
WBC: 5.1 10*3/uL (ref 4.0–10.5)
nRBC: 0 % (ref 0.0–0.2)

## 2023-09-30 LAB — IRON AND TIBC
Iron: 178 ug/dL (ref 45–182)
Saturation Ratios: 89 % — ABNORMAL HIGH (ref 17.9–39.5)
TIBC: 200 ug/dL — ABNORMAL LOW (ref 250–450)
UIBC: 22 ug/dL

## 2023-09-30 LAB — FERRITIN: Ferritin: 421 ng/mL — ABNORMAL HIGH (ref 24–336)

## 2023-09-30 NOTE — Progress Notes (Unsigned)
 Howard [name] presents for therapeutic phlebotomy per MD orders. Last HGB 11.7 / HCT 35.0 on 09-30-2023 . Vital signs stable prior to procedure. Procedure started at 11:19 am and ended at  11:26 am 500 mls of blood removed. Patient denies any dizziness , lightheadedness, or feeling faint.  Gauze and coban applied to site. Vital signs stable at completion of procedure. Patient has no complaints at this time. Alert and oriented x 3. Discharged in stable condition.

## 2023-10-05 ENCOUNTER — Inpatient Hospital Stay: Payer: 59

## 2023-10-05 ENCOUNTER — Inpatient Hospital Stay: Payer: 59 | Admitting: Physician Assistant

## 2023-10-07 ENCOUNTER — Inpatient Hospital Stay

## 2023-10-14 ENCOUNTER — Inpatient Hospital Stay: Admitting: Oncology

## 2023-10-14 ENCOUNTER — Inpatient Hospital Stay

## 2023-10-14 LAB — CBC
HCT: 38.1 % — ABNORMAL LOW (ref 39.0–52.0)
Hemoglobin: 12.8 g/dL — ABNORMAL LOW (ref 13.0–17.0)
MCH: 34.6 pg — ABNORMAL HIGH (ref 26.0–34.0)
MCHC: 33.6 g/dL (ref 30.0–36.0)
MCV: 103 fL — ABNORMAL HIGH (ref 80.0–100.0)
Platelets: 180 10*3/uL (ref 150–400)
RBC: 3.7 MIL/uL — ABNORMAL LOW (ref 4.22–5.81)
RDW: 13.4 % (ref 11.5–15.5)
WBC: 5.8 10*3/uL (ref 4.0–10.5)
nRBC: 0 % (ref 0.0–0.2)

## 2023-10-14 NOTE — Progress Notes (Signed)
 Patient presents today for therapeutic phlebotomy.  Patient is in satisfactory condition with no new complaints voiced.  Vital signs are stable.  We will proceed with phlebotomy per provider orders.  Therapeutic phlebotomy started at 1045 and ended at 1051.  500 mL of blood removed from L arm.  Vital signs remained stable.  Patient denies dizziness or lightheadedness.  Coban and gauze applied to site. Patient refused to wait the full 30 minute post wait time.  Patient waited about 10 mins.  Vitals remained stable. Patient left ambulatory in stable condition.

## 2023-10-14 NOTE — Progress Notes (Signed)
 [facility] [address], [place] [address]   CLINIC:  Medical Oncology/Hematology  PCP:  Kathyleen Parkins, MD [address] [place] [address] [phone]   REASON FOR VISIT:  Follow-up for hereditary hemochromatosis (C282Y homozygous)  CURRENT THERAPY: Therapeutic phlebotomy  INTERVAL HISTORY:   Howard [name] 58 y.o. male returns for routine follow-up of hereditary hemochromatosis.  He was last seen on 07/04/23 by Sheril Dines PA-C.  At today's visit, he reports feeling fairly well.  Appetite and energy levels are 75%.  Denies any pain.  He continues to tolerate phlebotomies well.  ASSESSMENT & PLAN:  1.  Hereditary hemochromatosis, C282Y homozygous - Seen at the request of Dr. Mordechai April (gastroenterology) for further evaluation and management of hemochromatosis, which was discovered during workup of transaminitis. Iron panel (04/07/2022) the with ferritin 2828, iron saturation 95%. Hemochromatosis DNA testing revealed C282Y homozygosity (04/19/2022) MRI abdomen (05/24/2022) consistent with severe diffuse hepatic iron deposition disease, without evidence of iron deposition within the spleen, pancreas, or bone marrow. - No family history of hemochromatosis. - Symptomatic iron overload with fatigue and arthralgia.  Symptoms have improved after starting phlebotomy.   - Weekly phlebotomy started in January 2024.  Tolerating phlebotomy fairly well.  2.  Mild macrocytic anemia - Noted to have mild normocytic anemia (prior to starting phlebotomy), with Hgb 12.5/MCV 97.7 on 06/07/2022 - Macrocytosis was not present until after patient started receiving regular phlebotomies - Anemia workup (07/15/2022): Normal B12 at 548, normal MMA.  Normal folate. CMP shows normal kidney function. SPEP, immunofixation, light chains negative for any monoclonal gammopathy - Reticulocytes somewhat low for degree of anemia at 0.7%  - Currently receiving weekly phlebotomy  for hereditary hemochromatosis, denies any other sources of blood loss.   - We will continue to monitor and consider additional testing if any severe worsening of anemia out of proportion to regular phlebotomies.  3.   Tobacco use - Current active smoker, 1 PPD since age 40 - Receives regular CT scans via cardiothoracic/cardiology for monitoring of bicuspid aorta and thoracic aortic aneurysm - CTA chest (02/21/2023) negative for any signs of pulmonary malignancy - We discussed importance of smoking cessation and will continue to follow-up on this at future visits. - Patient qualifies for LDCT scan, but this is unnecessary as he is currently receiving regular cardiac/chest imaging as above.  4.  Other history - PMH: Psoriatic arthritis (on chronic methotrexate), fatty liver disease, history of alcoholism, GERD, chronic pain, bicuspid aorta with thoracic aortic aneurysm, anxiety/depression, osteoarthritis.  Last colonoscopy was in 2021, with recommendations for repeat colonoscopy in 10 years.  - SOCIAL: Lives at home with his wife. He is currently on disability due to his psoriatic arthritis, but previously worked in [employer]. He has a history of alcoholism (drank heavily from age 40 to age 89), but has been sober since 2019. He also reports history of illicit drug use (cocaine, marijuana, opioid pills, fentanyl ), but has been clean for the past 4 years. He is a current active smoker, 1 PPD since age 38.  - FAMILY: No family history of hemochromatosis, blood problems, cancer  PLAN: 1. Hereditary hemochromatosis (HCC) (Primary)  - Most iron panel (09/30/2023): Ferritin 451, iron saturation 89% - CBC/D (09/30/2023): Hgb 12.8/MCV 103.0.  Normal WBC/platelets. - Have discussed with patient the natural course of untreated hereditary hemochromatosis with risk of cirrhosis, hepatocellular carcinoma, heart failure, and arrhythmia.  Iron overload also increases risk of certain bacterial infections. - We have  discussed lifestyle  modifications such as avoiding alcohol and iron supplementation.  Recent guidelines do not find that moderate red meat intake adversely affects patients with hemochromatosis.  Patient should avoid raw fish or undercooked meat due to risk of certain bacterial infections to thrive and iron rich environments. - We have discussed phlebotomy protocol for treatment of hemochromatosis: Weekly phlebotomy as tolerated until goal ferritin is reached. If Hgb drops < 11.0, we will decrease frequency of phlebotomy. If patient experiences severe side effects or hemodynamic instability, we will decrease amount taken for phlebotomy. Goal is ferritin < 50 for normalization of iron stores. Once iron stores have normalized, we will decrease frequency of phlebotomy. -Continue weekly CBC + phlebotomy (as long as Hgb >11.0)  - Labs and RTC in 3 months   PLAN SUMMARY: >> Weekly CBC + phlebotomy (as long as Hgb >11) >> Labs in 3 months = CBC/D, CMP, ferritin, iron/TIBC  >> OFFICE visit in 3 months     REVIEW OF SYSTEMS:   Review of Systems  Constitutional:  Positive for fatigue.     PHYSICAL EXAM:  ECOG PERFORMANCE STATUS: 1 - Symptomatic but completely ambulatory  Vitals:   10/14/23 0943  BP: 107/72  Pulse: 60  Resp: 16  Temp: (!) 95.4 F (35.2 C)  SpO2: 100%   Filed Weights   10/14/23 0943  Weight: 153 lb 14.1 oz (69.8 kg)   Physical Exam Constitutional:      Appearance: Normal appearance.  Cardiovascular:     Rate and Rhythm: Normal rate and regular rhythm.  Pulmonary:     Effort: Pulmonary effort is normal.     Breath sounds: Normal breath sounds.  Abdominal:     General: Bowel sounds are normal.     Palpations: Abdomen is soft.  Musculoskeletal:        General: No swelling. Normal range of motion.     Thoracic back: Scoliosis present.  Neurological:     Mental Status: He is alert and oriented to person, place, and time. Mental status is at baseline.    PAST  MEDICAL/SURGICAL HISTORY:  Past Medical History:  Diagnosis Date   Alcohol abuse    Anxiety    Chronic back pain    Chronic pain    Depression    Fall    Fracture of right elbow    Hereditary hemochromatosis (HCC) 06/01/2022   Osteoarthritis    Psoriasis    Past Surgical History:  Procedure Laterality Date   APPENDECTOMY     COLONOSCOPY WITH PROPOFOL  N/A 06/07/2019   diverticulosis, otherwise normal. Non-bleeding internal hemorrhoids.    FOOT SURGERY     per patient: "neurofibroma excision"   LUMBAR LAMINECTOMY/DECOMPRESSION MICRODISCECTOMY Bilateral 12/31/2015   Procedure: LUMBER DECOMPRESSION L4-5/ L5-S1 BILATERALLY  ;  Surgeon: Orvan Blanch, MD;  Location: [facility];  Service: Orthopedics;  Laterality: Bilateral;   REVERSE SHOULDER ARTHROPLASTY Right 10/24/2018   REVERSE SHOULDER ARTHROPLASTY Right 10/24/2018   Procedure: REVERSE SHOULDER ARTHROPLASTY;  Surgeon: Janeth Medicus, MD;  Location: [facility] OR;  Service: Orthopedics;  Laterality: Right;   SHOULDER SURGERY Right    TOTAL HIP ARTHROPLASTY Right    TOTAL HIP ARTHROPLASTY Left 08/27/2014   Procedure: LEFT TOTAL HIP ARTHROPLASTY ANTERIOR APPROACH;  Surgeon: Claiborne Crew, MD;  Location: [facility];  Service: Orthopedics;  Laterality: Left;   XI ROBOTIC ASSISTED INGUINAL HERNIA REPAIR WITH MESH Right 05/12/2022   Procedure: XI ROBOTIC ASSISTED INGUINAL HERNIA REPAIR WITH MESH, RIGHT;  Surgeon: Alanda Allegra, MD;  Location: [facility];  Service: General;  Laterality: Right;    SOCIAL HISTORY:  Social History   Socioeconomic History   Marital status: Married    Spouse name: Not on file   Number of children: Not on file   Years of education: Not on file   Highest education level: Not on file  Occupational History   Not on file  Tobacco Use   Smoking status: Every Day    Current packs/day: 1.00    Average packs/day: 1 pack/day for 32.0 years (32.0 ttl pk-yrs)    Types: Cigarettes    Passive exposure: Current   Smokeless  tobacco: Never  Vaping Use   Vaping status: Never Used  Substance and Sexual Activity   Alcohol use: Not Currently    Alcohol/week: 16.0 standard drinks of alcohol    Types: 6 Glasses of wine, 10 Cans of beer per week    Comment: None currently (last ETOH 03/01/2018); previously drinks 3 to 4 beers daily and 1 glass of wine; "way more" on the weekend   Drug use: Yes    Types: Marijuana    Comment: hx of marijuana use years ago    Sexual activity: Not Currently  Other Topics Concern   Not on file  Social History Narrative   Not on file   Social Drivers of Health   Financial Resource Strain: Not on file  Food Insecurity: No Food Insecurity (06/01/2022)   Hunger Vital Sign    Worried About Running Out of Food in the Last Year: Never true    Ran Out of Food in the Last Year: Never true  Transportation Needs: No Transportation Needs (06/01/2022)   PRAPARE - Administrator, Civil Service (Medical): No    Lack of Transportation (Non-Medical): No  Physical Activity: Not on file  Stress: Not on file  Social Connections: Not on file  Intimate Partner Violence: Not At Risk (06/01/2022)   Humiliation, Afraid, Rape, and Kick questionnaire    Fear of Current or Ex-Partner: No    Emotionally Abused: No    Physically Abused: No    Sexually Abused: No    FAMILY HISTORY:  Family History  Problem Relation Age of Onset   Heart attack Mother    Colon cancer Neg Hx     CURRENT MEDICATIONS:  Outpatient Encounter Medications as of 10/14/2023  Medication Sig   ALPRAZolam  (XANAX ) 0.5 MG tablet Take 0.5 mg by mouth 2 (two) times daily as needed for anxiety.   amphetamine -dextroamphetamine  (ADDERALL) 20 MG tablet Take 20 mg by mouth 3 (three) times daily.   Buprenorphine HCl-Naloxone HCl 8-2 MG FILM Place under the tongue.   folic acid  (FOLVITE ) 1 MG tablet Take 1 mg by mouth daily.   Golimumab (SIMPONI ARIA IV) Inject 1 Dose into the vein every 8 (eight) weeks.   Methotrexate Sodium  (METHOTREXATE, PF,) 50 MG/2ML injection Inject 0.7 mLs into the muscle once a week.   omeprazole (PRILOSEC) 20 MG capsule Take 20 mg by mouth daily as needed (acid reflux).   predniSONE  (DELTASONE ) 5 MG tablet 1-2 tablets as needed for flares Orally Once a day for 30 days   [DISCONTINUED] tamsulosin (FLOMAX) 0.4 MG CAPS capsule Take 0.4 mg by mouth daily.   No facility-administered encounter medications on file as of 10/14/2023.    ALLERGIES:  No Known Allergies  LABORATORY DATA:  I have reviewed the labs as listed.  CBC    Component Value Date/Time   WBC 5.8 10/14/2023 0852  RBC 3.70 (L) 10/14/2023 0852   HGB 12.8 (L) 10/14/2023 0852   HGB 12.3 (L) 06/15/2023 1038   HCT 38.1 (L) 10/14/2023 0852   HCT 38.4 06/15/2023 1038   PLT 180 10/14/2023 0852   PLT 191 06/15/2023 1038   MCV 103.0 (H) 10/14/2023 0852   MCV 104 (H) 06/15/2023 1038   MCH 34.6 (H) 10/14/2023 0852   MCHC 33.6 10/14/2023 0852   RDW 13.4 10/14/2023 0852   RDW 12.0 06/15/2023 1038   LYMPHSABS 2.6 09/30/2023 1020   LYMPHSABS 2.2 06/15/2023 1038   MONOABS 0.5 09/30/2023 1020   EOSABS 0.0 09/30/2023 1020   EOSABS 0.1 06/15/2023 1038   BASOSABS 0.0 09/30/2023 1020   BASOSABS 0.1 06/15/2023 1038      Latest Ref Rng & Units 09/30/2023   10:20 AM 06/24/2023   12:24 PM 06/15/2023   10:38 AM  CMP  Glucose 70 - 99 mg/dL 098  119  86   BUN 6 - 20 mg/dL 18  17  18    Creatinine 0.61 - 1.24 mg/dL 1.47  8.29  5.62   Sodium 135 - 145 mmol/L 131  139  142   Potassium 3.5 - 5.1 mmol/L 4.1  4.0  4.8   Chloride 98 - 111 mmol/L 98  104  102   CO2 22 - 32 mmol/L 26  27  27    Calcium  8.9 - 10.3 mg/dL 8.7  8.6  9.1   Total Protein 6.5 - 8.1 g/dL 6.0  5.5  5.7   Total Bilirubin 0.0 - 1.2 mg/dL 1.2  0.6  0.5   Alkaline Phos 38 - 126 U/L 90  77  109   AST 15 - 41 U/L 34  40  39   ALT 0 - 44 U/L 31  40  33     DIAGNOSTIC IMAGING:  I have independently reviewed the relevant imaging and discussed with the patient.   WRAP  UP:  All questions were answered. The patient knows to call the clinic with any problems, questions or concerns.  Medical decision making: Low  Time spent on visit: I spent 25 minutes dedicated to the care of this patient (face-to-face and non-face-to-face) on the date of the encounter to include what is described in the assessment and plan.   Aurther Blue, NP  10/14/23 10:12 AM

## 2023-10-14 NOTE — Patient Instructions (Signed)

## 2023-10-21 ENCOUNTER — Inpatient Hospital Stay: Attending: Hematology

## 2023-10-21 ENCOUNTER — Inpatient Hospital Stay

## 2023-10-21 LAB — CBC
HCT: 36 % — ABNORMAL LOW (ref 39.0–52.0)
Hemoglobin: 12.4 g/dL — ABNORMAL LOW (ref 13.0–17.0)
MCH: 34.8 pg — ABNORMAL HIGH (ref 26.0–34.0)
MCHC: 34.4 g/dL (ref 30.0–36.0)
MCV: 101.1 fL — ABNORMAL HIGH (ref 80.0–100.0)
Platelets: 179 10*3/uL (ref 150–400)
RBC: 3.56 MIL/uL — ABNORMAL LOW (ref 4.22–5.81)
RDW: 13.1 % (ref 11.5–15.5)
WBC: 5.1 10*3/uL (ref 4.0–10.5)
nRBC: 0 % (ref 0.0–0.2)

## 2023-10-21 NOTE — Progress Notes (Signed)
 Pt.'s Hgb 12.4 and blood pressure 106/61. Per pt he would like to hold today's phlebotomy because he is feeling good and has no complaints at this time. Howell Macintosh NP notified. No new orders given at this time. Pt.'s next phlebotomy 10/28/23. All follow ups as scheduled.   [place] Hair

## 2023-10-28 ENCOUNTER — Inpatient Hospital Stay

## 2023-10-28 LAB — CBC
HCT: 37.3 % — ABNORMAL LOW (ref 39.0–52.0)
Hemoglobin: 12.6 g/dL — ABNORMAL LOW (ref 13.0–17.0)
MCH: 33.7 pg (ref 26.0–34.0)
MCHC: 33.8 g/dL (ref 30.0–36.0)
MCV: 99.7 fL (ref 80.0–100.0)
Platelets: 173 10*3/uL (ref 150–400)
RBC: 3.74 MIL/uL — ABNORMAL LOW (ref 4.22–5.81)
RDW: 12.9 % (ref 11.5–15.5)
WBC: 6.2 10*3/uL (ref 4.0–10.5)
nRBC: 0 % (ref 0.0–0.2)

## 2023-10-28 NOTE — Patient Instructions (Signed)
 [facility] [place] - A DEPT OF . [facility]  Discharge Instructions: Thank you for choosing [facility] to provide your oncology and hematology care.  If you have a lab appointment with the Cancer Center - please note that after April 8th, 2024, all labs will be drawn in the cancer center.  You do not have to check in or register with the main entrance as you have in the past but will complete your check-in in the cancer center.  Wear comfortable clothing and clothing appropriate for easy access to any Portacath or PICC line.   We strive to give you quality time with your provider. You may need to reschedule your appointment if you arrive late (15 or more minutes).  Arriving late affects you and other patients whose appointments are after yours.  Also, if you miss three or more appointments without notifying the office, you may be dismissed from the clinic at the provider's discretion.      For prescription refill requests, have your pharmacy contact our office and allow 72 hours for refills to be completed.    Today you received the following therapeutic phlebotomy      BELOW ARE SYMPTOMS THAT SHOULD BE REPORTED IMMEDIATELY: *FEVER GREATER THAN 100.4 F (38 C) OR HIGHER *CHILLS OR SWEATING *NAUSEA AND VOMITING THAT IS NOT CONTROLLED WITH YOUR NAUSEA MEDICATION *UNUSUAL SHORTNESS OF BREATH *UNUSUAL BRUISING OR BLEEDING *URINARY PROBLEMS (pain or burning when urinating, or frequent urination) *BOWEL PROBLEMS (unusual diarrhea, constipation, pain near the anus) TENDERNESS IN MOUTH AND THROAT WITH OR WITHOUT PRESENCE OF ULCERS (sore throat, sores in mouth, or a toothache) UNUSUAL RASH, SWELLING OR PAIN  UNUSUAL VAGINAL DISCHARGE OR ITCHING   Items with * indicate a potential emergency and should be followed up as soon as possible or go to the Emergency Department if any problems should occur.  Please show the CHEMOTHERAPY ALERT CARD or IMMUNOTHERAPY  ALERT CARD at check-in to the Emergency Department and triage nurse.  Should you have questions after your visit or need to cancel or reschedule your appointment, please contact [facility] [place] - A DEPT OF [facility] [phone]  and follow the prompts.  Office hours are 8:00 a.m. to 4:30 p.m. Monday - Friday. Please note that voicemails left after 4:00 p.m. may not be returned until the following business day.  We are closed weekends and major holidays. You have access to a nurse at all times for urgent questions. Please call the main number to the clinic [phone] and follow the prompts.  For any non-urgent questions, you may also contact your provider using MyChart. We now offer e-Visits for anyone 31 and older to request care online for non-urgent symptoms. For details visit [email or web address].   Also download the MyChart app! Go to the app store, search MyChart, open the app, select [facility], and log in with your MyChart username and password.

## 2023-10-28 NOTE — Progress Notes (Signed)
 Yaron R Lavoy presents today for theraputic phlebotomy per MD orders. Last hgb 12.6/hct 37.3 was on 10/28/23 VSS prior to procedure. Pt reports eating before arrival. Procedure started at 1146 using patients left AC. 500 mL of blood removed. Procedure ended at 1153. Gauze and coban applied to [facility], site clean and dry. VSS upon completion of procedure. Pt denies dizziness, lightheadedness, or feeling faint. Observed for 15 minutes post procedure. Discharged in satisfactory condition with follow up instructions.

## 2023-11-04 ENCOUNTER — Inpatient Hospital Stay

## 2023-11-10 ENCOUNTER — Encounter: Payer: Self-pay | Admitting: Internal Medicine

## 2023-11-11 ENCOUNTER — Inpatient Hospital Stay

## 2023-11-11 LAB — CBC
HCT: 35.8 % — ABNORMAL LOW (ref 39.0–52.0)
Hemoglobin: 11.9 g/dL — ABNORMAL LOW (ref 13.0–17.0)
MCH: 33.7 pg (ref 26.0–34.0)
MCHC: 33.2 g/dL (ref 30.0–36.0)
MCV: 101.4 fL — ABNORMAL HIGH (ref 80.0–100.0)
Platelets: 161 10*3/uL (ref 150–400)
RBC: 3.53 MIL/uL — ABNORMAL LOW (ref 4.22–5.81)
RDW: 13 % (ref 11.5–15.5)
WBC: 4.8 10*3/uL (ref 4.0–10.5)
nRBC: 0 % (ref 0.0–0.2)

## 2023-11-11 NOTE — Progress Notes (Unsigned)
 Howard [name] presents for therapeutic phlebotomy per MD orders. Last HGB 11.9 / HCT 35.8 on 11-11-2023 . Vital signs stable prior to procedure. Unsuccessful phlebotomy. Unable to obtain blood. Patient requests to reschedule for next week and hydrate prior to arrival. Discharged from clinic ambulatory in stable condition. Alert and oriented x 3. F/U with [facility] as scheduled.

## 2023-11-17 ENCOUNTER — Inpatient Hospital Stay: Attending: Hematology

## 2023-11-17 ENCOUNTER — Inpatient Hospital Stay

## 2023-11-17 LAB — CBC
HCT: 36.8 % — ABNORMAL LOW (ref 39.0–52.0)
Hemoglobin: 12.3 g/dL — ABNORMAL LOW (ref 13.0–17.0)
MCH: 33.4 pg (ref 26.0–34.0)
MCHC: 33.4 g/dL (ref 30.0–36.0)
MCV: 100 fL (ref 80.0–100.0)
Platelets: 172 10*3/uL (ref 150–400)
RBC: 3.68 MIL/uL — ABNORMAL LOW (ref 4.22–5.81)
RDW: 12.8 % (ref 11.5–15.5)
WBC: 7.1 10*3/uL (ref 4.0–10.5)
nRBC: 0 % (ref 0.0–0.2)

## 2023-11-17 NOTE — Progress Notes (Signed)
 Patient presents today for phlebotomy per MD orders. Phlebotomy procedure started at 1508 and ended at 1513. 500 cc removed. Patient tolerated procedure well. Procedure tolerated well and without incident. Discharged ambulatory in stable condition.

## 2023-11-17 NOTE — Progress Notes (Signed)
   11/17/23 1520  Spiritual Encounters  Type of Visit Initial  Care provided to: Patient  Referral source Other (comment) (Chaplain on rounds)  Reason for visit  (Introduction to Spiritual Care)  OnCall Visit No  Spiritual Framework  Presenting Themes Meaning/purpose/sources of inspiration;Rituals and practive;Community and relationships  Community/Connection Family;Faith community;Spiritual leader  Interventions  Spiritual Care Interventions Made Established relationship of care and support;Reflective listening;Narrative/life review  Spiritual Care Plan  Spiritual Care Issues Still Outstanding No further spiritual care needs at this time (see row info)  Advance Directives (For Healthcare)  Does Patient Have a Medical Advance Directive? No  Type of Estate agent of [place];Living will  Copy of Healthcare Power of Attorney in Chart? No - copy requested  Copy of Living Will in Chart? No - copy requested  Would patient like information on creating a medical advance directive? No - Patient declined  Mental Health Advance Directives  Does Patient Have a Mental Health Advance Directive? No  Would patient like information on creating a mental health advance directive? No - Patient declined   Chaplain met Howard [name] while doing rounds on the floor.  Though I had never met Howard [name] before he knew me and my wife from the [facility] and the church community.  We shared a conversation regarding our shared faith.  Howard [name] was very encouraging.  No further spiritual care interventions necessary at this time.

## 2023-11-17 NOTE — Patient Instructions (Signed)
 [facility] [place] [facility]  Discharge Instructions: Thank you for choosing [facility] to provide your oncology and hematology care.  If you have a lab appointment with the Cancer Center - please note that after April 8th, 2024, all labs will be drawn in the cancer center.  You do not have to check in or register with the main entrance as you have in the past but will complete your check-in in the cancer center.  Wear comfortable clothing and clothing appropriate for easy access to any Portacath or PICC line.   We strive to give you quality time with your provider. You may need to reschedule your appointment if you arrive late (15 or more minutes).  Arriving late affects you and other patients whose appointments are after yours.  Also, if you miss three or more appointments without notifying the office, you may be dismissed from the clinic at the provider's discretion.      For prescription refill requests, have your pharmacy contact our office and allow 72 hours for refills to be completed.    Today you received the following chemotherapy and/or immunotherapy agents Phlebotomy      To help prevent nausea and vomiting after your treatment, we encourage you to take your nausea medication as directed.  BELOW ARE SYMPTOMS THAT SHOULD BE REPORTED IMMEDIATELY: *FEVER GREATER THAN 100.4 F (38 C) OR HIGHER *CHILLS OR SWEATING *NAUSEA AND VOMITING THAT IS NOT CONTROLLED WITH YOUR NAUSEA MEDICATION *UNUSUAL SHORTNESS OF BREATH *UNUSUAL BRUISING OR BLEEDING *URINARY PROBLEMS (pain or burning when urinating, or frequent urination) *BOWEL PROBLEMS (unusual diarrhea, constipation, pain near the anus) TENDERNESS IN MOUTH AND THROAT WITH OR WITHOUT PRESENCE OF ULCERS (sore throat, sores in mouth, or a toothache) UNUSUAL RASH, SWELLING OR PAIN  UNUSUAL VAGINAL DISCHARGE OR ITCHING   Items with * indicate a potential emergency and should be followed  up as soon as possible or go to the Emergency Department if any problems should occur.  Please show the CHEMOTHERAPY ALERT CARD or IMMUNOTHERAPY ALERT CARD at check-in to the Emergency Department and triage nurse.  Should you have questions after your visit or need to cancel or reschedule your appointment, please contact [facility] CANCER CTR [place] - A DEPT OF Eligha Bridegroom [facility] [phone]  and follow the prompts.  Office hours are 8:00 a.m. to 4:30 p.m. Monday - Friday. Please note that voicemails left after 4:00 p.m. may not be returned until the following business day.  We are closed weekends and major holidays. You have access to a nurse at all times for urgent questions. Please call the main number to the clinic [phone] and follow the prompts.  For any non-urgent questions, you may also contact your provider using MyChart. We now offer e-Visits for anyone 11 and older to request care online for non-urgent symptoms. For details visit [email or web address].   Also download the MyChart app! Go to the app store, search "MyChart", open the app, select [place], and log in with your MyChart username and password.

## 2023-11-25 ENCOUNTER — Inpatient Hospital Stay

## 2023-12-02 ENCOUNTER — Inpatient Hospital Stay

## 2023-12-02 LAB — CBC
HCT: 36.1 % — ABNORMAL LOW (ref 39.0–52.0)
Hemoglobin: 12.2 g/dL — ABNORMAL LOW (ref 13.0–17.0)
MCH: 33.9 pg (ref 26.0–34.0)
MCHC: 33.8 g/dL (ref 30.0–36.0)
MCV: 100.3 fL — ABNORMAL HIGH (ref 80.0–100.0)
Platelets: 184 K/uL (ref 150–400)
RBC: 3.6 MIL/uL — ABNORMAL LOW (ref 4.22–5.81)
RDW: 13 % (ref 11.5–15.5)
WBC: 5.6 K/uL (ref 4.0–10.5)
nRBC: 0 % (ref 0.0–0.2)

## 2023-12-02 NOTE — Patient Instructions (Signed)
 [facility] [place] - A DEPT OF [place]  Discharge Instructions: Thank you for choosing [facility] to provide your oncology and hematology care.  If you have a lab appointment with the Cancer Center - please note that after April 8th, 2024, all labs will be drawn in the cancer center.  You do not have to check in or register with the main entrance as you have in the past but will complete your check-in in the cancer center.  Wear comfortable clothing and clothing appropriate for easy access to any Portacath or PICC line.   We strive to give you quality time with your provider. You may need to reschedule your appointment if you arrive late (15 or more minutes).  Arriving late affects you and other patients whose appointments are after yours.  Also, if you miss three or more appointments without notifying the office, you may be dismissed from the clinic at the provider's discretion.      For prescription refill requests, have your pharmacy contact our office and allow 72 hours for refills to be completed.    Therapeutic Phlebotomy Therapeutic phlebotomy is the planned removal of blood from a person's body for the purpose of treating a medical condition. The procedure is lot like donating blood. Usually, about a pint (470 mL, or 0.47 L) of blood is removed. The average adult has 9-12 pints (4.3-5.7 L) of blood in his or her body. Therapeutic phlebotomy may be used to treat the following medical conditions: Hemochromatosis. This is a condition in which the blood contains too much iron. Polycythemia vera. This is a condition in which the blood contains too many red blood cells. Porphyria cutanea tarda. This is a disease in which an important part of hemoglobin is not made properly. It results in the buildup of abnormal amounts of porphyrins in the body. Sickle cell disease. This is a condition in which the red blood cells form an abnormal crescent shape rather  than a round shape. Tell a health care provider about: Any allergies you have. All medicines you are taking, including vitamins, herbs, eye drops, creams, and over-the-counter medicines. Any bleeding problems you have. Any surgeries you have had. Any medical conditions you have. Whether you are pregnant or may be pregnant. What are the risks? Generally, this is a safe procedure. However, problems may occur, including: Nausea or light-headedness. Low blood pressure (hypotension). Soreness, bleeding, swelling, or bruising at the needle insertion site. Infection. What happens before the procedure? Ask your health care provider about: Changing or stopping your regular medicines. This is especially important if you are taking diabetes medicines or blood thinners. Taking medicines such as aspirin  and ibuprofen . These medicines can thin your blood. Do not take these medicines unless your health care provider tells you to take them. Taking over-the-counter medicines, vitamins, herbs, and supplements. Wear clothing with sleeves that can be raised above the elbow. You may have a blood sample taken. Your blood pressure, pulse rate, and breathing rate will be measured. What happens during the procedure?  You may be given a medicine to numb the area (local anesthetic). A tourniquet will be placed on your arm. A needle will be put into one of your veins. Tubing and a collection bag will be attached to the needle. Blood will flow through the needle and tubing into the collection bag. The collection bag will be placed lower than your arm so gravity can help the blood flow into the  bag. You may be asked to open and close your hand slowly and continually during the entire collection. After the specified amount of blood has been removed from your body, the collection bag and tubing will be clamped. The needle will be removed from your vein. Pressure will be held on the needle site to stop the  bleeding. A bandage (dressing) will be placed over the needle insertion site. The procedure may vary among health care providers and hospitals. What happens after the procedure? Your blood pressure, pulse rate, and breathing rate will be measured after the procedure. You will be encouraged to drink fluids. You will be encouraged to eat a snack to prevent a low blood sugar level. Your recovery will be assessed and monitored. Return to your normal activities as told by your health care provider. Summary Therapeutic phlebotomy is the planned removal of blood from a person's body for the purpose of treating a medical condition. Therapeutic phlebotomy may be used to treat hemochromatosis, polycythemia vera, porphyria cutanea tarda, or sickle cell disease. In the procedure, a needle is inserted and about a pint (470 mL, or 0.47 L) of blood is removed. The average adult has 9-12 pints (4.3-5.7 L) of blood in the body. This is generally a safe procedure, but it can sometimes cause problems such as nausea, light-headedness, or low blood pressure (hypotension). This information is not intended to replace advice given to you by your health care provider. Make sure you discuss any questions you have with your health care provider. Document Revised: 10/29/2020 Document Reviewed: 10/29/2020 Elsevier Patient Education  2024 Elsevier Inc.   To help prevent nausea and vomiting after your treatment, we encourage you to take your nausea medication as directed.  BELOW ARE SYMPTOMS THAT SHOULD BE REPORTED IMMEDIATELY: *FEVER GREATER THAN 100.4 F (38 C) OR HIGHER *CHILLS OR SWEATING *NAUSEA AND VOMITING THAT IS NOT CONTROLLED WITH YOUR NAUSEA MEDICATION *UNUSUAL SHORTNESS OF BREATH *UNUSUAL BRUISING OR BLEEDING *URINARY PROBLEMS (pain or burning when urinating, or frequent urination) *BOWEL PROBLEMS (unusual diarrhea, constipation, pain near the anus) TENDERNESS IN MOUTH AND THROAT WITH OR WITHOUT PRESENCE  OF ULCERS (sore throat, sores in mouth, or a toothache) UNUSUAL RASH, SWELLING OR PAIN  UNUSUAL VAGINAL DISCHARGE OR ITCHING   Items with * indicate a potential emergency and should be followed up as soon as possible or go to the Emergency Department if any problems should occur.  Please show the CHEMOTHERAPY ALERT CARD or IMMUNOTHERAPY ALERT CARD at check-in to the Emergency Department and triage nurse.  Should you have questions after your visit or need to cancel or reschedule your appointment, please contact [facility] [place] - A DEPT OF [facility] [phone]  and follow the prompts.  Office hours are 8:00 a.m. to 4:30 p.m. Monday - Friday. Please note that voicemails left after 4:00 p.m. may not be returned until the following business day.  We are closed weekends and major holidays. You have access to a nurse at all times for urgent questions. Please call the main number to the clinic [phone] and follow the prompts.  For any non-urgent questions, you may also contact your provider using MyChart. We now offer e-Visits for anyone 44 and older to request care online for non-urgent symptoms. For details visit [email or web address].   Also download the MyChart app! Go to the app store, search MyChart, open the app, select [place], and log in with your MyChart username and password.

## 2023-12-02 NOTE — Progress Notes (Signed)
 Patient presents today for therapeutic phlebotomy.  Patient is in satisfactory condition with no new complaints voiced.  Vital signs are stable.  We will proceed with phlebotomy per provider orders.  Therapeutic phlebotomy started at 1057 and ended at 1109.  500 mL of blood removed from R arm.  Vital signs remained stable.  Patient denies dizziness or lightheadedness.  Coban and gauze applied to site. Patient refused to wait the recommended 30 minute post phlebotomy wait time.  Patient waited about 15 mins and left in stable condition.

## 2023-12-09 ENCOUNTER — Inpatient Hospital Stay

## 2023-12-14 ENCOUNTER — Other Ambulatory Visit: Payer: Self-pay

## 2023-12-16 ENCOUNTER — Inpatient Hospital Stay

## 2023-12-16 ENCOUNTER — Inpatient Hospital Stay: Attending: Hematology

## 2023-12-16 DIAGNOSIS — Z79899 Other long term (current) drug therapy: Secondary | ICD-10-CM | POA: Diagnosis not present

## 2023-12-16 LAB — CBC
HCT: 38.2 % — ABNORMAL LOW (ref 39.0–52.0)
Hemoglobin: 13 g/dL (ref 13.0–17.0)
MCH: 34.2 pg — ABNORMAL HIGH (ref 26.0–34.0)
MCHC: 34 g/dL (ref 30.0–36.0)
MCV: 100.5 fL — ABNORMAL HIGH (ref 80.0–100.0)
Platelets: 189 K/uL (ref 150–400)
RBC: 3.8 MIL/uL — ABNORMAL LOW (ref 4.22–5.81)
RDW: 13.2 % (ref 11.5–15.5)
WBC: 6.4 K/uL (ref 4.0–10.5)
nRBC: 0 % (ref 0.0–0.2)

## 2023-12-16 NOTE — Progress Notes (Signed)
 Patient presents today for therapeutic phlebotomy.  Patient is in satisfactory condition with no new complaints voiced.  Vital signs are stable.  We will proceed with phlebotomy per provider orders.  Therapeutic phlebotomy started at 1139 and ended at 1150.  Approximately 200 mL of blood removed from R arm.  Patient denied re-sticking to get the remaining 300 mL.  Vital signs remained stable.  Patient denies dizziness or lightheadedness.  Coban and gauze applied to site. Patient refused to wait the recommended 30 minute post phlebotomy wait time.  Patient left ambulatory in stable condition.

## 2023-12-16 NOTE — Patient Instructions (Signed)
 [facility] [place] - A DEPT OF [place]. [facility]  Discharge Instructions: Thank you for choosing [facility] to provide your oncology and hematology care.  If you have a lab appointment with the Cancer Center - please note that after April 8th, 2024, all labs will be drawn in the cancer center.  You do not have to check in or register with the main entrance as you have in the past but will complete your check-in in the cancer center.  Wear comfortable clothing and clothing appropriate for easy access to any Portacath or PICC line.   We strive to give you quality time with your provider. You may need to reschedule your appointment if you arrive late (15 or more minutes).  Arriving late affects you and other patients whose appointments are after yours.  Also, if you miss three or more appointments without notifying the office, you may be dismissed from the clinic at the provider's discretion.      For prescription refill requests, have your pharmacy contact our office and allow 72 hours for refills to be completed.    Today you received the following :  Therapeutic phlebotomy.  Therapeutic Phlebotomy Therapeutic phlebotomy is the planned removal of blood from a person's body for the purpose of treating a medical condition. The procedure is lot like donating blood. Usually, about a pint (470 mL, or 0.47 L) of blood is removed. The average adult has 9-12 pints (4.3-5.7 L) of blood in his or her body. Therapeutic phlebotomy may be used to treat the following medical conditions: Hemochromatosis. This is a condition in which the blood contains too much iron. Polycythemia vera. This is a condition in which the blood contains too many red blood cells. Porphyria cutanea tarda. This is a disease in which an important part of hemoglobin is not made properly. It results in the buildup of abnormal amounts of porphyrins in the body. Sickle cell disease. This is a condition in  which the red blood cells form an abnormal crescent shape rather than a round shape. Tell a health care provider about: Any allergies you have. All medicines you are taking, including vitamins, herbs, eye drops, creams, and over-the-counter medicines. Any bleeding problems you have. Any surgeries you have had. Any medical conditions you have. Whether you are pregnant or may be pregnant. What are the risks? Generally, this is a safe procedure. However, problems may occur, including: Nausea or light-headedness. Low blood pressure (hypotension). Soreness, bleeding, swelling, or bruising at the needle insertion site. Infection. What happens before the procedure? Ask your health care provider about: Changing or stopping your regular medicines. This is especially important if you are taking diabetes medicines or blood thinners. Taking medicines such as aspirin  and ibuprofen . These medicines can thin your blood. Do not take these medicines unless your health care provider tells you to take them. Taking over-the-counter medicines, vitamins, herbs, and supplements. Wear clothing with sleeves that can be raised above the elbow. You may have a blood sample taken. Your blood pressure, pulse rate, and breathing rate will be measured. What happens during the procedure?  You may be given a medicine to numb the area (local anesthetic). A tourniquet will be placed on your arm. A needle will be put into one of your veins. Tubing and a collection bag will be attached to the needle. Blood will flow through the needle and tubing into the collection bag. The collection bag will be placed lower than your  arm so gravity can help the blood flow into the bag. You may be asked to open and close your hand slowly and continually during the entire collection. After the specified amount of blood has been removed from your body, the collection bag and tubing will be clamped. The needle will be removed from your  vein. Pressure will be held on the needle site to stop the bleeding. A bandage (dressing) will be placed over the needle insertion site. The procedure may vary among health care providers and hospitals. What happens after the procedure? Your blood pressure, pulse rate, and breathing rate will be measured after the procedure. You will be encouraged to drink fluids. You will be encouraged to eat a snack to prevent a low blood sugar level. Your recovery will be assessed and monitored. Return to your normal activities as told by your health care provider. Summary Therapeutic phlebotomy is the planned removal of blood from a person's body for the purpose of treating a medical condition. Therapeutic phlebotomy may be used to treat hemochromatosis, polycythemia vera, porphyria cutanea tarda, or sickle cell disease. In the procedure, a needle is inserted and about a pint (470 mL, or 0.47 L) of blood is removed. The average adult has 9-12 pints (4.3-5.7 L) of blood in the body. This is generally a safe procedure, but it can sometimes cause problems such as nausea, light-headedness, or low blood pressure (hypotension). This information is not intended to replace advice given to you by your health care provider. Make sure you discuss any questions you have with your health care provider. Document Revised: 10/29/2020 Document Reviewed: 10/29/2020 Elsevier Patient Education  2024 Elsevier Inc.     To help prevent nausea and vomiting after your treatment, we encourage you to take your nausea medication as directed.  BELOW ARE SYMPTOMS THAT SHOULD BE REPORTED IMMEDIATELY: *FEVER GREATER THAN 100.4 F (38 C) OR HIGHER *CHILLS OR SWEATING *NAUSEA AND VOMITING THAT IS NOT CONTROLLED WITH YOUR NAUSEA MEDICATION *UNUSUAL SHORTNESS OF BREATH *UNUSUAL BRUISING OR BLEEDING *URINARY PROBLEMS (pain or burning when urinating, or frequent urination) *BOWEL PROBLEMS (unusual diarrhea, constipation, pain near the  anus) TENDERNESS IN MOUTH AND THROAT WITH OR WITHOUT PRESENCE OF ULCERS (sore throat, sores in mouth, or a toothache) UNUSUAL RASH, SWELLING OR PAIN  UNUSUAL VAGINAL DISCHARGE OR ITCHING   Items with * indicate a potential emergency and should be followed up as soon as possible or go to the Emergency Department if any problems should occur.  Please show the CHEMOTHERAPY ALERT CARD or IMMUNOTHERAPY ALERT CARD at check-in to the Emergency Department and triage nurse.  Should you have questions after your visit or need to cancel or reschedule your appointment, please contact [facility] [place] - A DEPT OF [facility] [phone]  and follow the prompts.  Office hours are 8:00 a.m. to 4:30 p.m. Monday - Friday. Please note that voicemails left after 4:00 p.m. may not be returned until the following business day.  We are closed weekends and major holidays. You have access to a nurse at all times for urgent questions. Please call the main number to the clinic [phone] and follow the prompts.  For any non-urgent questions, you may also contact your provider using MyChart. We now offer e-Visits for anyone 48 and older to request care online for non-urgent symptoms. For details visit [email or web address].   Also download the MyChart app! Go to the app store, search MyChart, open the app, select [place], and  log in with your MyChart username and password.

## 2023-12-21 ENCOUNTER — Other Ambulatory Visit: Payer: Self-pay

## 2023-12-23 ENCOUNTER — Inpatient Hospital Stay

## 2023-12-23 LAB — CBC
HCT: 38.3 % — ABNORMAL LOW (ref 39.0–52.0)
Hemoglobin: 12.7 g/dL — ABNORMAL LOW (ref 13.0–17.0)
MCH: 33.8 pg (ref 26.0–34.0)
MCHC: 33.2 g/dL (ref 30.0–36.0)
MCV: 101.9 fL — ABNORMAL HIGH (ref 80.0–100.0)
Platelets: 169 K/uL (ref 150–400)
RBC: 3.76 MIL/uL — ABNORMAL LOW (ref 4.22–5.81)
RDW: 13.1 % (ref 11.5–15.5)
WBC: 5.3 K/uL (ref 4.0–10.5)
nRBC: 0 % (ref 0.0–0.2)

## 2023-12-23 NOTE — Progress Notes (Signed)
 Hgb 12.7. vitals stable. Patient is in satisfactory condition with no new complaints voiced.   Unable to get phlebotomy to flow. Patient denied re-sticking to have phlebotomy done. Per pt he will keep his appointments as is. Gauze and Coban applied to site. Pt left ambulatory in stable condition. All follow ups as scheduled.   Howard [name]

## 2023-12-27 ENCOUNTER — Ambulatory Visit: Admitting: Internal Medicine

## 2023-12-30 ENCOUNTER — Inpatient Hospital Stay

## 2023-12-30 LAB — CBC WITH DIFFERENTIAL/PLATELET
Abs Immature Granulocytes: 0.01 K/uL (ref 0.00–0.07)
Basophils Absolute: 0.1 K/uL (ref 0.0–0.1)
Basophils Relative: 1 %
Eosinophils Absolute: 0.1 K/uL (ref 0.0–0.5)
Eosinophils Relative: 1 %
HCT: 36.1 % — ABNORMAL LOW (ref 39.0–52.0)
Hemoglobin: 12.3 g/dL — ABNORMAL LOW (ref 13.0–17.0)
Immature Granulocytes: 0 %
Lymphocytes Relative: 38 %
Lymphs Abs: 2 K/uL (ref 0.7–4.0)
MCH: 34.1 pg — ABNORMAL HIGH (ref 26.0–34.0)
MCHC: 34.1 g/dL (ref 30.0–36.0)
MCV: 100 fL (ref 80.0–100.0)
Monocytes Absolute: 0.5 K/uL (ref 0.1–1.0)
Monocytes Relative: 9 %
Neutro Abs: 2.7 K/uL (ref 1.7–7.7)
Neutrophils Relative %: 51 %
Platelets: 164 K/uL (ref 150–400)
RBC: 3.61 MIL/uL — ABNORMAL LOW (ref 4.22–5.81)
RDW: 13.1 % (ref 11.5–15.5)
WBC: 5.3 K/uL (ref 4.0–10.5)
nRBC: 0 % (ref 0.0–0.2)

## 2023-12-30 LAB — COMPREHENSIVE METABOLIC PANEL WITH GFR
ALT: 29 U/L (ref 0–44)
AST: 29 U/L (ref 15–41)
Albumin: 3.8 g/dL (ref 3.5–5.0)
Alkaline Phosphatase: 89 U/L (ref 38–126)
Anion gap: 7 (ref 5–15)
BUN: 28 mg/dL — ABNORMAL HIGH (ref 6–20)
CO2: 26 mmol/L (ref 22–32)
Calcium: 8.7 mg/dL — ABNORMAL LOW (ref 8.9–10.3)
Chloride: 104 mmol/L (ref 98–111)
Creatinine, Ser: 0.56 mg/dL — ABNORMAL LOW (ref 0.61–1.24)
GFR, Estimated: >60 mL/min (ref >60)
Glucose, Bld: 119 mg/dL — ABNORMAL HIGH (ref 70–99)
Potassium: 4.2 mmol/L (ref 3.5–5.1)
Sodium: 137 mmol/L (ref 135–145)
Total Bilirubin: 0.7 mg/dL (ref 0.0–1.2)
Total Protein: 5.6 g/dL — ABNORMAL LOW (ref 6.5–8.1)

## 2023-12-30 LAB — IRON AND TIBC
Iron: 123 ug/dL (ref 45–182)
Saturation Ratios: 65 % — ABNORMAL HIGH (ref 17.9–39.5)
TIBC: 188 ug/dL — ABNORMAL LOW (ref 250–450)
UIBC: 65 ug/dL

## 2023-12-30 LAB — FERRITIN: Ferritin: 263 ng/mL (ref 24–336)

## 2023-12-30 NOTE — Progress Notes (Signed)
 Patient presents for therapeutic phlebotomy. Patient is in satisfactory condition with no new complaints voiced. Vitals signs are stable. Labs are within parameters. We will proceed with phlebotomy per provider orders.    Therapeutic phlebotomy started at 1145 and ended 1154. 500 mL of blood removed from Right arm. Vitals stable. Pt denies dizziness or lightheadedness. Patient refuse to wait the recommended 30 minutes post phlebotomy. Patient waited 10 minutes and left in stable condition. All follow ups as scheduled.   Howard [name]

## 2024-01-05 ENCOUNTER — Other Ambulatory Visit: Payer: Self-pay

## 2024-01-06 ENCOUNTER — Inpatient Hospital Stay

## 2024-01-06 LAB — CBC
HCT: 36.1 % — ABNORMAL LOW (ref 39.0–52.0)
Hemoglobin: 12 g/dL — ABNORMAL LOW (ref 13.0–17.0)
MCH: 33.4 pg (ref 26.0–34.0)
MCHC: 33.2 g/dL (ref 30.0–36.0)
MCV: 100.6 fL — ABNORMAL HIGH (ref 80.0–100.0)
Platelets: 184 K/uL (ref 150–400)
RBC: 3.59 MIL/uL — ABNORMAL LOW (ref 4.22–5.81)
RDW: 12.9 % (ref 11.5–15.5)
WBC: 6.2 K/uL (ref 4.0–10.5)
nRBC: 0 % (ref 0.0–0.2)

## 2024-01-06 NOTE — Patient Instructions (Signed)
 [facility] [place] - [facility]  Discharge Instructions: Thank you for choosing [facility] to provide your oncology and hematology care.  If you have a lab appointment with the Cancer Center - please note that after April 8th, 2024, all labs will be drawn in the cancer center.  You do not have to check in or register with the main entrance as you have in the past but will complete your check-in in the cancer center.  Wear comfortable clothing and clothing appropriate for easy access to any Portacath or PICC line.   We strive to give you quality time with your provider. You may need to reschedule your appointment if you arrive late (15 or more minutes).  Arriving late affects you and other patients whose appointments are after yours.  Also, if you miss three or more appointments without notifying the office, you may be dismissed from the clinic at the provider's discretion.      For prescription refill requests, have your pharmacy contact our office and allow 72 hours for refills to be completed.    Today you received the following chemotherapy and/or immunotherapy agents phlebotomy. Therapeutic Phlebotomy, Care After The following information offers guidance on how to care for yourself after your procedure. Your health care provider may also give you more specific instructions. If you have problems or questions, contact your health care provider. What can I expect after the procedure? After therapeutic phlebotomy, it is common to have: Light-headedness or dizziness. You may feel faint. Nausea. Tiredness (fatigue). Follow these instructions at home: Eating and drinking Be sure to eat well-balanced meals for the next 24 hours. Drink enough fluid to keep your urine pale yellow. Avoid drinking alcohol on the day that you had the procedure. Activity  Return to your normal activities as told by your health care provider. Most people can go back  to their normal activities right away. Avoid activities that take a lot of effort for about 5 hours after the procedure. Athletes should avoid strenuous exercise for at least 12 hours. Avoid heavy lifting or pulling for about 5 hours after the procedure. Do not lift anything that is heavier than 10 lb (4.5 kg). Change positions slowly for the remainder of the day, like from sitting to standing. This can help prevent light-headedness or fainting. If you feel light-headed, lie down until the feeling goes away. Needle insertion site care  Keep your bandage (dressing) dry. You can remove the bandage after about 5 hours or as told by your health care provider. If you have bleeding from the needle insertion site, raise (elevate) your arm and press firmly on the site until the bleeding stops. If you have bruising at the site, apply ice to the area. To do this: Put ice in a plastic bag. Place a towel between your skin and the bag. Leave the ice on for 20 minutes, 2-3 times a day for the first 24 hours. Remove the ice if your skin turns bright red so you do not damage the area. If the swelling does not go away after 24 hours, apply a warm, moist cloth (warm compress) to the area for 20 minutes, 2-3 times a day. General instructions Do not use any products that contain nicotine or tobacco, like cigarettes, chewing tobacco, and vaping devices, such as e-cigarettes, for at least 30 minutes after the procedure. If you need help quitting, ask your health care provider. Keep all follow-up visits.  You may need to continue having regular blood tests and therapeutic phlebotomy treatments as directed. Contact a health care provider if: You have redness, swelling, or pain at the needle insertion site. Fluid or blood is coming from the needle insertion site. Pus or a bad smell is coming from the needle insertion site. The needle insertion site feels warm to the touch. You feel light-headed, dizzy, or nauseous, and  the feeling does not go away. You have new bruising at the needle insertion site. You feel weaker than normal. You have a fever or chills. Get help right away if: You have chest pain. You have trouble breathing. You have severe nausea or vomiting. Summary After the procedure, it is common to have some light-headedness, dizziness, nausea, or tiredness (fatigue). Be sure to eat well-balanced meals for the next 24 hours. Drink enough fluid to keep your urine pale yellow. Return to your normal activities as told by your health care provider. Keep all follow-up visits. You may need to continue having regular blood tests and therapeutic phlebotomy treatments as directed. This information is not intended to replace advice given to you by your health care provider. Make sure you discuss any questions you have with your health care provider. Document Revised: 10/19/2022 Document Reviewed: 10/29/2020 Elsevier Patient Education  2024 [employer]vier Inc.      To help prevent nausea and vomiting after your treatment, we encourage you to take your nausea medication as directed.  BELOW ARE SYMPTOMS THAT SHOULD BE REPORTED IMMEDIATELY: *FEVER GREATER THAN 100.4 F (38 C) OR HIGHER *CHILLS OR SWEATING *NAUSEA AND VOMITING THAT IS NOT CONTROLLED WITH YOUR NAUSEA MEDICATION *UNUSUAL SHORTNESS OF BREATH *UNUSUAL BRUISING OR BLEEDING *URINARY PROBLEMS (pain or burning when urinating, or frequent urination) *BOWEL PROBLEMS (unusual diarrhea, constipation, pain near the anus) TENDERNESS IN MOUTH AND THROAT WITH OR WITHOUT PRESENCE OF ULCERS (sore throat, sores in mouth, or a toothache) UNUSUAL RASH, SWELLING OR PAIN  UNUSUAL VAGINAL DISCHARGE OR ITCHING   Items with * indicate a potential emergency and should be followed up as soon as possible or go to the Emergency Department if any problems should occur.  Please show the CHEMOTHERAPY ALERT CARD or IMMUNOTHERAPY ALERT CARD at check-in to the Emergency  Department and triage nurse.  Should you have questions after your visit or need to cancel or reschedule your appointment, please contact [facility] CANCER CTR [place] - A DEPT OF Eligha Bridegroom [facility] [phone]  and follow the prompts.  Office hours are 8:00 a.m. to 4:30 p.m. Monday - Friday. Please note that voicemails left after 4:00 p.m. may not be returned until the following business day.  We are closed weekends and major holidays. You have access to a nurse at all times for urgent questions. Please call the main number to the clinic [phone] and follow the prompts.  For any non-urgent questions, you may also contact your provider using MyChart. We now offer e-Visits for anyone 28 and older to request care online for non-urgent symptoms. For details visit [email or web address].   Also download the MyChart app! Go to the app store, search "MyChart", open the app, select [place], and log in with your MyChart username and password.

## 2024-01-06 NOTE — Progress Notes (Signed)
 Howard [name] presents for therapeutic phlebotomy per MD orders. Last HGB 12.0 / HCT 36.1 on 01-06-2024 . Vital signs stable prior to procedure. Procedure started at 11:03 am and ended at 11:13am. 500 mls of blood removed. Patient denies any dizziness , lightheadedness, or feeling faint.  Gauze and coban applied to site. Vital signs stable at completion of procedure. Patient has no complaints at this time. Alert and oriented x 3. Discharged in stable condition.

## 2024-01-12 ENCOUNTER — Other Ambulatory Visit: Payer: Self-pay

## 2024-01-13 ENCOUNTER — Inpatient Hospital Stay

## 2024-01-20 ENCOUNTER — Inpatient Hospital Stay: Attending: Hematology

## 2024-01-20 ENCOUNTER — Inpatient Hospital Stay

## 2024-01-20 LAB — CBC
HCT: 37.8 % — ABNORMAL LOW (ref 39.0–52.0)
Hemoglobin: 12.7 g/dL — ABNORMAL LOW (ref 13.0–17.0)
MCH: 34 pg (ref 26.0–34.0)
MCHC: 33.6 g/dL (ref 30.0–36.0)
MCV: 101.1 fL — ABNORMAL HIGH (ref 80.0–100.0)
Platelets: 199 K/uL (ref 150–400)
RBC: 3.74 MIL/uL — ABNORMAL LOW (ref 4.22–5.81)
RDW: 13.5 % (ref 11.5–15.5)
WBC: 5.9 K/uL (ref 4.0–10.5)
nRBC: 0 % (ref 0.0–0.2)

## 2024-01-20 NOTE — Progress Notes (Signed)
 Howard [name] presents today for phlebotomy per MD orders. Phlebotomy procedure started at 1140 and ended at 1152 500 cc removed. Patient tolerated procedure well. IV needle removed intact.  Patient denies any dizziness, lightheadedness, or feeling faint. Gauze and coban applied to site. Vital signs stable at completion of procedure. Patient has no complaints at this time. All follow ups as scheduled.   Cloteal Isaacson

## 2024-01-26 ENCOUNTER — Other Ambulatory Visit: Payer: Self-pay

## 2024-01-27 ENCOUNTER — Inpatient Hospital Stay

## 2024-01-27 LAB — CBC
HCT: 35.7 % — ABNORMAL LOW (ref 39.0–52.0)
Hemoglobin: 12.2 g/dL — ABNORMAL LOW (ref 13.0–17.0)
MCH: 33.7 pg (ref 26.0–34.0)
MCHC: 34.2 g/dL (ref 30.0–36.0)
MCV: 98.6 fL (ref 80.0–100.0)
Platelets: 196 K/uL (ref 150–400)
RBC: 3.62 MIL/uL — ABNORMAL LOW (ref 4.22–5.81)
RDW: 13.3 % (ref 11.5–15.5)
WBC: 6.6 K/uL (ref 4.0–10.5)
nRBC: 0 % (ref 0.0–0.2)

## 2024-01-27 NOTE — Patient Instructions (Signed)

## 2024-01-27 NOTE — Progress Notes (Signed)
 Howard [name] presents today for phlebotomy per MD orders. Phlebotomy procedure started at 1132 and ended at 1140. 120 cc removed. Patient tolerated procedure well. IV needle removed intact.  Patient complained of discomfort and asked to stop phlebotomy.    Patient tolerated phlebotomy.  Peripheral IV site intact with no bruising or swelling noted.  Denied SOB, chest pain, or dizziness.  Gauze with coban applied.  Discharged with VSS and no s/s of distress noted.

## 2024-01-31 ENCOUNTER — Ambulatory Visit: Admitting: Internal Medicine

## 2024-02-02 ENCOUNTER — Other Ambulatory Visit: Payer: Self-pay

## 2024-02-03 ENCOUNTER — Inpatient Hospital Stay

## 2024-02-03 ENCOUNTER — Inpatient Hospital Stay: Admitting: Oncology

## 2024-02-03 LAB — CBC
HCT: 37.3 % — ABNORMAL LOW (ref 39.0–52.0)
Hemoglobin: 12.6 g/dL — ABNORMAL LOW (ref 13.0–17.0)
MCH: 34.7 pg — ABNORMAL HIGH (ref 26.0–34.0)
MCHC: 33.8 g/dL (ref 30.0–36.0)
MCV: 102.8 fL — ABNORMAL HIGH (ref 80.0–100.0)
Platelets: 183 K/uL (ref 150–400)
RBC: 3.63 MIL/uL — ABNORMAL LOW (ref 4.22–5.81)
RDW: 13.8 % (ref 11.5–15.5)
WBC: 7.1 K/uL (ref 4.0–10.5)
nRBC: 0 % (ref 0.0–0.2)

## 2024-02-03 NOTE — Assessment & Plan Note (Addendum)
-   Most iron panel 12/30/2023 showed ferritin of 263, iron saturations 65%. - CBC/D 02/03/2024 showed a hemoglobin of 12.6/hematocrit 37.3, MCV 102.8 with a normal platelet count. - Have discussed with patient the natural course of untreated hereditary hemochromatosis with risk of cirrhosis, hepatocellular carcinoma, heart failure, and arrhythmia.  Iron overload also increases risk of certain bacterial infections. - We have discussed lifestyle modifications such as avoiding alcohol and iron supplementation.  Recent guidelines do not find that moderate red meat intake adversely affects patients with hemochromatosis.  Patient should avoid raw fish or undercooked meat due to risk of certain bacterial infections to thrive and iron rich environments. - We have discussed phlebotomy protocol for treatment of hemochromatosis: Weekly phlebotomy as tolerated until goal ferritin is reached. If Hgb drops < 11.0, we will decrease frequency of phlebotomy. If patient experiences severe side effects or hemodynamic instability, we will decrease amount taken for phlebotomy. Goal is ferritin < 50 for normalization of iron stores. Once iron stores have normalized, we will decrease frequency of phlebotomy. - Recommend every other week CBC + phlebotomy (as long as Hgb >11.0)  - Labs and RTC in 3 months.

## 2024-02-03 NOTE — Progress Notes (Signed)
 [name] OFFICE PROGRESS NOTE  Bertell Satterfield, MD  ASSESSMENT & PLAN:    Assessment & Plan Hereditary hemochromatosis (HCC)  - Most iron panel 12/30/2023 showed ferritin of 263, iron saturations 65%. - CBC/D 02/03/2024 showed a hemoglobin of 12.6/hematocrit 37.3, MCV 102.8 with a normal platelet count. - Have discussed with patient the natural course of untreated hereditary hemochromatosis with risk of cirrhosis, hepatocellular carcinoma, heart failure, and arrhythmia.  Iron overload also increases risk of certain bacterial infections. - We have discussed lifestyle modifications such as avoiding alcohol and iron supplementation.  Recent guidelines do not find that moderate red meat intake adversely affects patients with hemochromatosis.  Patient should avoid raw fish or undercooked meat due to risk of certain bacterial infections to thrive and iron rich environments. - We have discussed phlebotomy protocol for treatment of hemochromatosis: Weekly phlebotomy as tolerated until goal ferritin is reached. If Hgb drops < 11.0, we will decrease frequency of phlebotomy. If patient experiences severe side effects or hemodynamic instability, we will decrease amount taken for phlebotomy. Goal is ferritin < 50 for normalization of iron stores. Once iron stores have normalized, we will decrease frequency of phlebotomy. - Recommend every other week CBC + phlebotomy (as long as Hgb >11.0)  - Labs and RTC in 3 months.   Orders Placed This Encounter  Procedures   Ferritin    Standing Status:   Future    Expected Date:   04/04/2024    Expiration Date:   07/03/2024    Release to patient:   Immediate   Iron and TIBC    Standing Status:   Future    Expected Date:   04/04/2024    Expiration Date:   07/03/2024    Release to patient:   Immediate   CBC with Differential/Platelet    Standing Status:   Future    Expected Date:   04/04/2024    Expiration Date:   07/03/2024    Release to  patient:   Immediate   Comprehensive metabolic panel    Standing Status:   Future    Expected Date:   04/04/2024    Expiration Date:   07/03/2024    INTERVAL HISTORY: Patient returns for follow-up.  He continues weekly phlebotomies.  Most recent ferritin was 263.  Hemoglobin has maintained around 12-13.  Reports appetite is 50% and he has very low energy levels.  He had a steroid injection in his left shoulder and felt great for 2 days.  Reports he has been followed by a homeopathic doctor and he is on several supplements to help with his iron levels.  He is hoping in the future, to not have to have phlebotomies.  He denies any interval hospitalizations, surgeries or changes to his baseline health.  We reviewed CBC.  SUMMARY OF HEMATOLOGIC HISTORY: Oncology History   No history exists.   1.  Hereditary hemochromatosis, C282Y homozygous - Seen at the request of Dr. Cindie (gastroenterology) for further evaluation and management of hemochromatosis, which was discovered during workup of transaminitis. Iron panel (04/07/2022) the with ferritin 2828, iron saturation 95%. Hemochromatosis DNA testing revealed C282Y homozygosity (04/19/2022) MRI abdomen (05/24/2022) consistent with severe diffuse hepatic iron deposition disease, without evidence of iron deposition within the spleen, pancreas, or bone marrow. - No family history of hemochromatosis. - Symptomatic iron overload with fatigue and arthralgia.  Symptoms have improved after starting phlebotomy.   - Weekly phlebotomy started in January 2024.  Tolerating phlebotomy fairly well.  2.  Mild macrocytic anemia - Noted to have mild normocytic anemia (prior to starting phlebotomy), with Hgb 12.5/MCV 97.7 on 06/07/2022 - Macrocytosis was not present until after patient started receiving regular phlebotomies - Anemia workup (07/15/2022): Normal B12 at 548, normal MMA.  Normal folate. CMP shows normal kidney function. SPEP, immunofixation, light  chains negative for any monoclonal gammopathy - Reticulocytes somewhat low for degree of anemia at 0.7%  - Currently receiving weekly phlebotomy for hereditary hemochromatosis, denies any other sources of blood loss.   - We will continue to monitor and consider additional testing if any severe worsening of anemia out of proportion to regular phlebotomies.   3.   Tobacco use - Current active smoker, 1 PPD since age 61 - Receives regular CT scans via cardiothoracic/cardiology for monitoring of bicuspid aorta and thoracic aortic aneurysm - CTA chest (02/21/2023) negative for any signs of pulmonary malignancy - We discussed importance of smoking cessation and will continue to follow-up on this at future visits. - Patient qualifies for LDCT scan, but this is unnecessary as he is currently receiving regular cardiac/chest imaging as above.   4.  Other history - PMH: Psoriatic arthritis (on chronic methotrexate), fatty liver disease, history of alcoholism, GERD, chronic pain, bicuspid aorta with thoracic aortic aneurysm, anxiety/depression, osteoarthritis.  Last colonoscopy was in 2021, with recommendations for repeat colonoscopy in 10 years.  - SOCIAL: Lives at home with his wife. He is currently on disability due to his psoriatic arthritis, but previously worked in [employer]. He has a history of alcoholism (drank heavily from age 61 to age 19), but has been sober since 2019. He also reports history of illicit drug use (cocaine, marijuana, opioid pills, fentanyl ), but has been clean for the past 4 years. He is a current active smoker, 1 PPD since age 36.  - FAMILY: No family history of hemochromatosis, blood problems, cancer    CBC    Component Value Date/Time   WBC 7.1 02/03/2024 0826   RBC 3.63 (L) 02/03/2024 0826   HGB 12.6 (L) 02/03/2024 0826   HGB 12.3 (L) 06/15/2023 1038   HCT 37.3 (L) 02/03/2024 0826   HCT 38.4 06/15/2023 1038   PLT 183 02/03/2024 0826   PLT 191 06/15/2023 1038   MCV 102.8  (H) 02/03/2024 0826   MCV 104 (H) 06/15/2023 1038   MCH 34.7 (H) 02/03/2024 0826   MCHC 33.8 02/03/2024 0826   RDW 13.8 02/03/2024 0826   RDW 12.0 06/15/2023 1038   LYMPHSABS 2.0 12/30/2023 1038   LYMPHSABS 2.2 06/15/2023 1038   MONOABS 0.5 12/30/2023 1038   EOSABS 0.1 12/30/2023 1038   EOSABS 0.1 06/15/2023 1038   BASOSABS 0.1 12/30/2023 1038   BASOSABS 0.1 06/15/2023 1038       Latest Ref Rng & Units 12/30/2023   10:38 AM 09/30/2023   10:20 AM 06/24/2023   12:24 PM  CMP  Glucose 70 - 99 mg/dL 880  885  891   BUN 6 - 20 mg/dL 28  18  17    Creatinine 0.61 - 1.24 mg/dL 9.43  9.31  9.31   Sodium 135 - 145 mmol/L 137  131  139   Potassium 3.5 - 5.1 mmol/L 4.2  4.1  4.0   Chloride 98 - 111 mmol/L 104  98  104   CO2 22 - 32 mmol/L 26  26  27    Calcium  8.9 - 10.3 mg/dL 8.7  8.7  8.6   Total Protein 6.5 - 8.1 g/dL 5.6  6.0  5.5   Total Bilirubin 0.0 - 1.2 mg/dL 0.7  1.2  0.6   Alkaline Phos 38 - 126 U/L 89  90  77   AST 15 - 41 U/L 29  34  40   ALT 0 - 44 U/L 29  31  40      Lab Results  Component Value Date   FERRITIN 263 12/30/2023   VITAMINB12 548 07/15/2022    Vitals:   02/03/24 0847  BP: 109/79  Pulse: 67  Resp: 18  Temp: 98.2 F (36.8 C)  SpO2: 100%    Review of System:  Review of Systems  Constitutional:  Positive for malaise/fatigue.  Musculoskeletal:  Positive for joint pain.  Neurological:  Positive for tingling.    Physical Exam: Physical Exam Constitutional:      Appearance: Normal appearance.  HENT:     Head: Normocephalic and atraumatic.  Eyes:     Pupils: Pupils are equal, round, and reactive to light.  Cardiovascular:     Rate and Rhythm: Normal rate and regular rhythm.     Heart sounds: Normal heart sounds. No murmur heard. Pulmonary:     Effort: Pulmonary effort is normal.     Breath sounds: Normal breath sounds. No wheezing.  Abdominal:     General: Bowel sounds are normal. There is no distension.     Palpations: Abdomen is soft.      Tenderness: There is no abdominal tenderness.  Musculoskeletal:        General: Normal range of motion.     Cervical back: Normal range of motion.  Skin:    General: Skin is warm and dry.     Findings: No rash.  Neurological:     Mental Status: He is alert and oriented to person, place, and time.     Gait: Gait is intact.  Psychiatric:        Mood and Affect: Mood and affect normal.        Cognition and Memory: Memory normal.        Judgment: Judgment normal.      I spent 20 minutes dedicated to the care of this patient (face-to-face and non-face-to-face) on the date of the encounter to include what is described in the assessment and plan.,  Delon Hope, NP 02/03/2024 9:12 AM

## 2024-02-03 NOTE — Progress Notes (Signed)
 Per NP pt does not want to have the phlebotomy today. All follow ups as scheduled.   Shane Melby

## 2024-02-09 ENCOUNTER — Other Ambulatory Visit: Payer: Self-pay

## 2024-02-10 ENCOUNTER — Inpatient Hospital Stay

## 2024-02-10 LAB — CBC
HCT: 37.3 % — ABNORMAL LOW (ref 39.0–52.0)
Hemoglobin: 12.5 g/dL — ABNORMAL LOW (ref 13.0–17.0)
MCH: 33.7 pg (ref 26.0–34.0)
MCHC: 33.5 g/dL (ref 30.0–36.0)
MCV: 100.5 fL — ABNORMAL HIGH (ref 80.0–100.0)
Platelets: 175 K/uL (ref 150–400)
RBC: 3.71 MIL/uL — ABNORMAL LOW (ref 4.22–5.81)
RDW: 13.5 % (ref 11.5–15.5)
WBC: 5.4 K/uL (ref 4.0–10.5)
nRBC: 0 % (ref 0.0–0.2)

## 2024-02-10 NOTE — Progress Notes (Signed)
 Pt.'s BP 102/62. Per pt he has been feeling tired and weak lately and does not want to do the phlebotomy today. Per pt he would like to keep his future phlebotomy's as scheduled. NP made aware, no new orders. All follow ups as scheduled.   Howard [name]

## 2024-02-17 ENCOUNTER — Ambulatory Visit: Admitting: Internal Medicine

## 2024-02-17 ENCOUNTER — Encounter: Payer: Self-pay | Admitting: Internal Medicine

## 2024-02-17 DIAGNOSIS — R10A1 Flank pain, right side: Secondary | ICD-10-CM | POA: Diagnosis not present

## 2024-02-17 DIAGNOSIS — K5903 Drug induced constipation: Secondary | ICD-10-CM

## 2024-02-17 DIAGNOSIS — K5909 Other constipation: Secondary | ICD-10-CM | POA: Diagnosis not present

## 2024-02-17 NOTE — Patient Instructions (Signed)
 It was good to see you today  Lets try Linzess 145 1 gelcap daily to promote good bowel function.  May actually prove your abdominal pain.  Can consider sending you to see Dr. Mavis for repair of left hernia in the future.  Can you to follow-up with hematologist to keep your ferritin low  Do not need a colonoscopy until 2031  Call me in 1 month let me know how the Linzess is working  Office visit with me in 2 months

## 2024-02-17 NOTE — Progress Notes (Signed)
 Gastroenterology Progress Note    Primary Care Physician:  Bertell Satterfield, MD Primary Gastroenterologist:  Dr.   Pre-Procedure History & Physical: HPI:  Howard [name] is a 58 y.o. male here for follow-up of levo-scoliosis /right sided abdominal pain chronic constipation.  History of distant alcohol use disorder-none in years.  C282 homozygous hemochromatosis - phlebotomies/management through hematology at [facility].  Last ferritin in the low 200 range.  Elastography K PA 5 prior MRI demonstrates slightly dilation of the bile ducts'  no evidence of advanced chronic liver disease.  Repeatedly normal LFTs.  Chronic opioid therapy.  History of left inguinal hernia repair with recurrence  Patient tells me when he has a good bowel movement the right side abdominal pain goes away.  Of note, he had a negative gallbladder ultrasound previously.  Due average or screening colonoscopy 2031  Past Medical History:  Diagnosis Date   Alcohol abuse    Anxiety    Chronic back pain    Chronic pain    Depression    Fall    Fracture of right elbow    Hereditary hemochromatosis 06/01/2022   Osteoarthritis    Psoriasis     Past Surgical History:  Procedure Laterality Date   APPENDECTOMY     COLONOSCOPY WITH PROPOFOL  N/A 06/07/2019   diverticulosis, otherwise normal. Non-bleeding internal hemorrhoids.    FOOT SURGERY     per patient: neurofibroma excision   LUMBAR LAMINECTOMY/DECOMPRESSION MICRODISCECTOMY Bilateral 12/31/2015   Procedure: LUMBER DECOMPRESSION L4-5/ L5-S1 BILATERALLY  ;  Surgeon: Reyes Billing, MD;  Location: [facility]S;  Service: Orthopedics;  Laterality: Bilateral;   REVERSE SHOULDER ARTHROPLASTY Right 10/24/2018   REVERSE SHOULDER ARTHROPLASTY Right 10/24/2018   Procedure: REVERSE SHOULDER ARTHROPLASTY;  Surgeon: Sharl Selinda Dover, MD;  Location: [facility] OR;  Service: Orthopedics;  Laterality: Right;   SHOULDER SURGERY Right    TOTAL HIP ARTHROPLASTY Right    TOTAL HIP  ARTHROPLASTY Left 08/27/2014   Procedure: LEFT TOTAL HIP ARTHROPLASTY ANTERIOR APPROACH;  Surgeon: Donnice Car, MD;  Location: [facility];  Service: Orthopedics;  Laterality: Left;   XI ROBOTIC ASSISTED INGUINAL HERNIA REPAIR WITH MESH Right 05/12/2022   Procedure: XI ROBOTIC ASSISTED INGUINAL HERNIA REPAIR WITH MESH, RIGHT;  Surgeon: Mavis Anes, MD;  Location: [facility];  Service: General;  Laterality: Right;    Prior to Admission medications   Medication Sig Start Date End Date Taking? Authorizing Provider  ALPRAZolam  (XANAX ) 0.5 MG tablet Take 0.5 mg by mouth 2 (two) times daily as needed for anxiety.   Yes [provider]  amphetamine -dextroamphetamine  (ADDERALL) 20 MG tablet Take 20 mg by mouth 3 (three) times daily.   Yes [provider]  Buprenorphine HCl-Naloxone HCl 8-2 MG FILM Place under the tongue. 05/21/22  Yes [provider]  folic acid  (FOLVITE ) 1 MG tablet Take 1 mg by mouth daily. 03/19/21  Yes [provider]  Golimumab (SIMPONI ARIA IV) Inject 1 Dose into the vein every 8 (eight) weeks.   Yes [provider]  Insulin Syringe-Needle U-100 (B-D INS SYR MICROFINE 1CC/27G) 27G X 5/8 1 ML MISC once a week for MTX; Duration: 84 days 10/11/23  Yes [provider]  methotrexate 50 MG/2ML injection Inject 50 mg/m2 into the muscle once a week. 02/04/24  Yes [provider]  omeprazole (PRILOSEC) 20 MG capsule Take 20 mg by mouth daily as needed (acid reflux). 02/07/22  Yes [provider]  rosuvastatin  (CRESTOR ) 5 MG tablet Take 5 mg by  mouth daily. 02/14/24  Yes [provider]  tamsulosin (FLOMAX) 0.4 MG CAPS capsule Take 0.4 mg by mouth daily. 02/04/24  Yes [provider]    Allergies as of 02/17/2024   (No Known Allergies)    Family History  Problem Relation Age of Onset   Heart attack Mother    Colon cancer Neg Hx     Social History   Socioeconomic History   Marital status: Married     Spouse name: Not on file   Number of children: Not on file   Years of education: Not on file   Highest education level: Not on file  Occupational History   Not on file  Tobacco Use   Smoking status: Every Day    Current packs/day: 1.00    Average packs/day: 1 pack/day for 32.0 years (32.0 ttl pk-yrs)    Types: Cigarettes    Passive exposure: Current   Smokeless tobacco: Never  Vaping Use   Vaping status: Never Used  Substance and Sexual Activity   Alcohol use: Not Currently    Alcohol/week: 16.0 standard drinks of alcohol    Types: 6 Glasses of wine, 10 Cans of beer per week    Comment: None currently (last ETOH 03/01/2018); previously drinks 3 to 4 beers daily and 1 glass of wine; way more on the weekend   Drug use: Yes    Types: Marijuana    Comment: hx of marijuana use years ago    Sexual activity: Not Currently  Other Topics Concern   Not on file  Social History Narrative   Not on file   Social Drivers of Health   Financial Resource Strain: Not on file  Food Insecurity: No Food Insecurity (06/01/2022)   Hunger Vital Sign    Worried About Running Out of Food in the Last Year: Never true    Ran Out of Food in the Last Year: Never true  Transportation Needs: No Transportation Needs (06/01/2022)   PRAPARE - Administrator, Civil Service (Medical): No    Lack of Transportation (Non-Medical): No  Physical Activity: Not on file  Stress: Not on file  Social Connections: Not on file  Intimate Partner Violence: Not At Risk (06/01/2022)   Humiliation, Afraid, Rape, and Kick questionnaire    Fear of Current or Ex-Partner: No    Emotionally Abused: No    Physically Abused: No    Sexually Abused: No    Review of Systems   See HPI, otherwise negative ROS  Physical Exam: BP 112/66 (BP Location: Right Arm, Patient Position: Sitting, Cuff Size: Normal)   Pulse 70   Temp (!) 97.5 F (36.4 C) (Oral)   Ht 5' 7 (1.702 m)   Wt 152 lb 12.8 oz (69.3 kg)   SpO2 98%    BMI 23.93 kg/m  General:   Alert,  Well-developed, well-nourished, pleasant and cooperative in NAD  Neck:  Supple; no masses or thyromegaly. No significant cervical adenopathy. Lungs:  Clear throughout to auscultation.   No wheezes, crackles, or rhonchi. No acute distress. Heart:  Regular rate and rhythm; no murmurs, clicks, rubs,  or gallops. Abdomen: Non-distended, normal bowel sounds.  Soft and nontender without appreciable mass or hepatosplenomegaly.    Impression/Plan:   58 year old gentleman with C282Y homozygous hemochromatosis phlebotomies periodically through hematology.  Right sided abdominal pain with relief with bowel movement chronic constipation.  Negative colonoscopy 2021.  No evidence of advanced chronic liver disease; LFTs repeatedly normal gallbladder ultrasound negative.  Although his scoliosis may be contributing to some musculoskeletal pain I have to wonder if chronic constipation secondary to opioids may be more at play here.  Separate issue recurrent left inguinal hernia  Mildly dilated bile duct-likely opioid-induced   Recommendations:  Lets try Linzess 145 1 gelcap daily to promote good bowel function.  May actually improve abdominal pain.  Can consider sending you to see Dr. Mavis for repair of left hernia in the future.  Continue to follow-up with hematologist to keep your ferritin low  Do not need a colonoscopy until 2031  Call me in 1 month let me know how the Linzess is working  Office visit with me in 2 months  Notice: This dictation was prepared with Dragon dictation along with smaller phrase technology. Any transcriptional errors that result from this process are unintentional and may not be corrected upon review.

## 2024-02-22 ENCOUNTER — Other Ambulatory Visit: Payer: 59

## 2024-02-23 ENCOUNTER — Other Ambulatory Visit: Payer: Self-pay

## 2024-02-24 ENCOUNTER — Inpatient Hospital Stay

## 2024-02-24 ENCOUNTER — Inpatient Hospital Stay: Attending: Hematology

## 2024-02-24 LAB — CBC
HCT: 38.9 % — ABNORMAL LOW (ref 39.0–52.0)
Hemoglobin: 13.3 g/dL (ref 13.0–17.0)
MCH: 34.3 pg — ABNORMAL HIGH (ref 26.0–34.0)
MCHC: 34.2 g/dL (ref 30.0–36.0)
MCV: 100.3 fL — ABNORMAL HIGH (ref 80.0–100.0)
Platelets: 194 K/uL (ref 150–400)
RBC: 3.88 MIL/uL — ABNORMAL LOW (ref 4.22–5.81)
RDW: 13.2 % (ref 11.5–15.5)
WBC: 8.3 K/uL (ref 4.0–10.5)
nRBC: 0 % (ref 0.0–0.2)

## 2024-02-24 NOTE — Patient Instructions (Signed)
 [facility] [place] - A DEPT OF [facility]  Discharge Instructions: Thank you for choosing [facility] to provide your oncology and hematology care.  If you have a lab appointment with the Cancer Center - please note that after April 8th, 2024, all labs will be drawn in the cancer center.  You do not have to check in or register with the main entrance as you have in the past but will complete your check-in in the cancer center.  Wear comfortable clothing and clothing appropriate for easy access to any Portacath or PICC line.   We strive to give you quality time with your provider. You may need to reschedule your appointment if you arrive late (15 or more minutes).  Arriving late affects you and other patients whose appointments are after yours.  Also, if you miss three or more appointments without notifying the office, you may be dismissed from the clinic at the provider's discretion.      For prescription refill requests, have your pharmacy contact our office and allow 72 hours for refills to be completed.    Today you received the following therapeutic phlebotomy    To help prevent nausea and vomiting after your treatment, we encourage you to take your nausea medication as directed.  BELOW ARE SYMPTOMS THAT SHOULD BE REPORTED IMMEDIATELY: *FEVER GREATER THAN 100.4 F (38 C) OR HIGHER *CHILLS OR SWEATING *NAUSEA AND VOMITING THAT IS NOT CONTROLLED WITH YOUR NAUSEA MEDICATION *UNUSUAL SHORTNESS OF BREATH *UNUSUAL BRUISING OR BLEEDING *URINARY PROBLEMS (pain or burning when urinating, or frequent urination) *BOWEL PROBLEMS (unusual diarrhea, constipation, pain near the anus) TENDERNESS IN MOUTH AND THROAT WITH OR WITHOUT PRESENCE OF ULCERS (sore throat, sores in mouth, or a toothache) UNUSUAL RASH, SWELLING OR PAIN  UNUSUAL VAGINAL DISCHARGE OR ITCHING   Items with * indicate a potential emergency and should be followed up as soon as possible or go to  the Emergency Department if any problems should occur.  Please show the CHEMOTHERAPY ALERT CARD or IMMUNOTHERAPY ALERT CARD at check-in to the Emergency Department and triage nurse.  Should you have questions after your visit or need to cancel or reschedule your appointment, please contact [facility] [place] - A DEPT OF [facility] [phone]  and follow the prompts.  Office hours are 8:00 a.m. to 4:30 p.m. Monday - Friday. Please note that voicemails left after 4:00 p.m. may not be returned until the following business day.  We are closed weekends and major holidays. You have access to a nurse at all times for urgent questions. Please call the main number to the clinic [phone] and follow the prompts.  For any non-urgent questions, you may also contact your provider using MyChart. We now offer e-Visits for anyone 54 and older to request care online for non-urgent symptoms. For details visit [email or web address].   Also download the MyChart app! Go to the app store, search MyChart, open the app, select [place], and log in with your MyChart username and password.

## 2024-02-24 NOTE — Progress Notes (Signed)
 Howard [name] presents today for phlebotomy per MD orders. Phlebotomy procedure started at 1127 and ended at 1137. 500 mls removed. Patient observed for 15  minutes after procedure without any incident. Patient tolerated procedure well. IV needle removed intact.

## 2024-03-08 ENCOUNTER — Other Ambulatory Visit: Payer: Self-pay

## 2024-03-09 ENCOUNTER — Inpatient Hospital Stay

## 2024-03-09 LAB — CBC
HCT: 37.3 % — ABNORMAL LOW (ref 39.0–52.0)
Hemoglobin: 12.6 g/dL — ABNORMAL LOW (ref 13.0–17.0)
MCH: 34 pg (ref 26.0–34.0)
MCHC: 33.8 g/dL (ref 30.0–36.0)
MCV: 100.5 fL — ABNORMAL HIGH (ref 80.0–100.0)
Platelets: 200 K/uL (ref 150–400)
RBC: 3.71 MIL/uL — ABNORMAL LOW (ref 4.22–5.81)
RDW: 13.4 % (ref 11.5–15.5)
WBC: 5.5 K/uL (ref 4.0–10.5)
nRBC: 0 % (ref 0.0–0.2)

## 2024-03-09 NOTE — Patient Instructions (Signed)
 [facility] [place] - A DEPT OF [facility]  Discharge Instructions: Thank you for choosing [facility] to provide your oncology and hematology care.  If you have a lab appointment with the Cancer Center - please note that after April 8th, 2024, all labs will be drawn in the cancer center.  You do not have to check in or register with the main entrance as you have in the past but will complete your check-in in the cancer center.  Wear comfortable clothing and clothing appropriate for easy access to any Portacath or PICC line.   We strive to give you quality time with your provider. You may need to reschedule your appointment if you arrive late (15 or more minutes).  Arriving late affects you and other patients whose appointments are after yours.  Also, if you miss three or more appointments without notifying the office, you may be dismissed from the clinic at the provider's discretion.      For prescription refill requests, have your pharmacy contact our office and allow 72 hours for refills to be completed.    Today you received the following therapeutic phlebotomy    To help prevent nausea and vomiting after your treatment, we encourage you to take your nausea medication as directed.  BELOW ARE SYMPTOMS THAT SHOULD BE REPORTED IMMEDIATELY: *FEVER GREATER THAN 100.4 F (38 C) OR HIGHER *CHILLS OR SWEATING *NAUSEA AND VOMITING THAT IS NOT CONTROLLED WITH YOUR NAUSEA MEDICATION *UNUSUAL SHORTNESS OF BREATH *UNUSUAL BRUISING OR BLEEDING *URINARY PROBLEMS (pain or burning when urinating, or frequent urination) *BOWEL PROBLEMS (unusual diarrhea, constipation, pain near the anus) TENDERNESS IN MOUTH AND THROAT WITH OR WITHOUT PRESENCE OF ULCERS (sore throat, sores in mouth, or a toothache) UNUSUAL RASH, SWELLING OR PAIN  UNUSUAL VAGINAL DISCHARGE OR ITCHING   Items with * indicate a potential emergency and should be followed up as soon as possible or go to  the Emergency Department if any problems should occur.  Please show the CHEMOTHERAPY ALERT CARD or IMMUNOTHERAPY ALERT CARD at check-in to the Emergency Department and triage nurse.  Should you have questions after your visit or need to cancel or reschedule your appointment, please contact [facility] [place] - A DEPT OF [facility] [phone]  and follow the prompts.  Office hours are 8:00 a.m. to 4:30 p.m. Monday - Friday. Please note that voicemails left after 4:00 p.m. may not be returned until the following business day.  We are closed weekends and major holidays. You have access to a nurse at all times for urgent questions. Please call the main number to the clinic [phone] and follow the prompts.  For any non-urgent questions, you may also contact your provider using MyChart. We now offer e-Visits for anyone 54 and older to request care online for non-urgent symptoms. For details visit [email or web address].   Also download the MyChart app! Go to the app store, search MyChart, open the app, select [place], and log in with your MyChart username and password.

## 2024-03-09 NOTE — Progress Notes (Signed)
 Howard [name] presents today for phlebotomy per MD orders. Phlebotomy procedure started at 1250 and ended at 1300. 250 mls removed. Unable to tolerate any more of the phlebotomy at this time. Patient observed for 15 minutes after procedure without any incident. Patient tolerated procedure well. IV needle removed intact.

## 2024-03-13 ENCOUNTER — Telehealth: Payer: Self-pay | Admitting: *Deleted

## 2024-03-13 NOTE — Telephone Encounter (Signed)
 Patient previously saw Dr Dann and had CT scan ordered to be done in October 2025.  I spoke with patient and he cancelled CT appointment due to insurance reasons.  He would like to continue to follow in our office and needs to establish with new provider.  Appointment made for patient to see Dr Ren on 11/11

## 2024-03-23 ENCOUNTER — Inpatient Hospital Stay

## 2024-03-23 ENCOUNTER — Inpatient Hospital Stay: Attending: Hematology

## 2024-03-23 DIAGNOSIS — F17210 Nicotine dependence, cigarettes, uncomplicated: Secondary | ICD-10-CM | POA: Insufficient documentation

## 2024-03-23 LAB — COMPREHENSIVE METABOLIC PANEL WITH GFR
ALT: 44 U/L (ref 0–44)
AST: 43 U/L — ABNORMAL HIGH (ref 15–41)
Albumin: 4.3 g/dL (ref 3.5–5.0)
Alkaline Phosphatase: 91 U/L (ref 38–126)
Anion gap: 7 (ref 5–15)
BUN: 17 mg/dL (ref 6–20)
CO2: 28 mmol/L (ref 22–32)
Calcium: 8.8 mg/dL — ABNORMAL LOW (ref 8.9–10.3)
Chloride: 103 mmol/L (ref 98–111)
Creatinine, Ser: 0.58 mg/dL — ABNORMAL LOW (ref 0.61–1.24)
GFR, Estimated: >60 mL/min (ref >60)
Glucose, Bld: 132 mg/dL — ABNORMAL HIGH (ref 70–99)
Potassium: 4.5 mmol/L (ref 3.5–5.1)
Sodium: 139 mmol/L (ref 135–145)
Total Bilirubin: 0.6 mg/dL (ref 0.0–1.2)
Total Protein: 5.8 g/dL — ABNORMAL LOW (ref 6.5–8.1)

## 2024-03-23 LAB — IRON AND TIBC
Iron: 187 ug/dL — ABNORMAL HIGH (ref 45–182)
Saturation Ratios: 90 % — ABNORMAL HIGH (ref 17.9–39.5)
TIBC: 207 ug/dL — ABNORMAL LOW (ref 250–450)
UIBC: 20 ug/dL

## 2024-03-23 LAB — CBC WITH DIFFERENTIAL/PLATELET
Abs Immature Granulocytes: 0.01 K/uL (ref 0.00–0.07)
Basophils Absolute: 0 K/uL (ref 0.0–0.1)
Basophils Relative: 1 %
Eosinophils Absolute: 0.1 K/uL (ref 0.0–0.5)
Eosinophils Relative: 1 %
HCT: 37.6 % — ABNORMAL LOW (ref 39.0–52.0)
Hemoglobin: 12.8 g/dL — ABNORMAL LOW (ref 13.0–17.0)
Immature Granulocytes: 0 %
Lymphocytes Relative: 20 %
Lymphs Abs: 1.1 K/uL (ref 0.7–4.0)
MCH: 34 pg (ref 26.0–34.0)
MCHC: 34 g/dL (ref 30.0–36.0)
MCV: 100 fL (ref 80.0–100.0)
Monocytes Absolute: 0.3 K/uL (ref 0.1–1.0)
Monocytes Relative: 6 %
Neutro Abs: 4.1 K/uL (ref 1.7–7.7)
Neutrophils Relative %: 72 %
Platelets: 169 K/uL (ref 150–400)
RBC: 3.76 MIL/uL — ABNORMAL LOW (ref 4.22–5.81)
RDW: 13.3 % (ref 11.5–15.5)
WBC: 5.7 K/uL (ref 4.0–10.5)
nRBC: 0 % (ref 0.0–0.2)

## 2024-03-23 LAB — FERRITIN: Ferritin: 503 ng/mL — ABNORMAL HIGH (ref 24–336)

## 2024-03-23 NOTE — Patient Instructions (Signed)
 [facility] [place] [facility]  Discharge Instructions: Thank you for choosing [facility] to provide your oncology and hematology care.  If you have a lab appointment with the Cancer Center - please note that after April 8th, 2024, all labs will be drawn in the cancer center.  You do not have to check in or register with the main entrance as you have in the past but will complete your check-in in the cancer center.  Wear comfortable clothing and clothing appropriate for easy access to any Portacath or PICC line.   We strive to give you quality time with your provider. You may need to reschedule your appointment if you arrive late (15 or more minutes).  Arriving late affects you and other patients whose appointments are after yours.  Also, if you miss three or more appointments without notifying the office, you may be dismissed from the clinic at the provider's discretion.      For prescription refill requests, have your pharmacy contact our office and allow 72 hours for refills to be completed.    Today you received the following chemotherapy and/or immunotherapy agents Phlebotomy      To help prevent nausea and vomiting after your treatment, we encourage you to take your nausea medication as directed.  BELOW ARE SYMPTOMS THAT SHOULD BE REPORTED IMMEDIATELY: *FEVER GREATER THAN 100.4 F (38 C) OR HIGHER *CHILLS OR SWEATING *NAUSEA AND VOMITING THAT IS NOT CONTROLLED WITH YOUR NAUSEA MEDICATION *UNUSUAL SHORTNESS OF BREATH *UNUSUAL BRUISING OR BLEEDING *URINARY PROBLEMS (pain or burning when urinating, or frequent urination) *BOWEL PROBLEMS (unusual diarrhea, constipation, pain near the anus) TENDERNESS IN MOUTH AND THROAT WITH OR WITHOUT PRESENCE OF ULCERS (sore throat, sores in mouth, or a toothache) UNUSUAL RASH, SWELLING OR PAIN  UNUSUAL VAGINAL DISCHARGE OR ITCHING   Items with * indicate a potential emergency and should be followed  up as soon as possible or go to the Emergency Department if any problems should occur.  Please show the CHEMOTHERAPY ALERT CARD or IMMUNOTHERAPY ALERT CARD at check-in to the Emergency Department and triage nurse.  Should you have questions after your visit or need to cancel or reschedule your appointment, please contact [facility] CANCER CTR [place] - A DEPT OF Eligha Bridegroom [facility] [phone]  and follow the prompts.  Office hours are 8:00 a.m. to 4:30 p.m. Monday - Friday. Please note that voicemails left after 4:00 p.m. may not be returned until the following business day.  We are closed weekends and major holidays. You have access to a nurse at all times for urgent questions. Please call the main number to the clinic [phone] and follow the prompts.  For any non-urgent questions, you may also contact your provider using MyChart. We now offer e-Visits for anyone 11 and older to request care online for non-urgent symptoms. For details visit [email or web address].   Also download the MyChart app! Go to the app store, search "MyChart", open the app, select [place], and log in with your MyChart username and password.

## 2024-03-23 NOTE — Progress Notes (Signed)
 Patient presents today for phlebotomy per MD orders. Phlebotomy procedure started at 1300 and ended at 1415. 500 cc removed. Patient tolerated procedure well. Procedure tolerated well and without incident. Discharged ambulatory in stable condition.

## 2024-03-27 ENCOUNTER — Ambulatory Visit

## 2024-04-04 ENCOUNTER — Inpatient Hospital Stay (HOSPITAL_BASED_OUTPATIENT_CLINIC_OR_DEPARTMENT_OTHER): Admitting: Oncology

## 2024-04-04 DIAGNOSIS — F17210 Nicotine dependence, cigarettes, uncomplicated: Secondary | ICD-10-CM

## 2024-04-04 NOTE — Assessment & Plan Note (Addendum)
-   Most iron panel 03/23/2024 show iron saturation 90%, TIBC 207, hemoglobin 12.8, hematocrit 37.6, ferritin 503 and unremarkable CMP. - Have discussed with patient the natural course of untreated hereditary hemochromatosis with risk of cirrhosis, hepatocellular carcinoma, heart failure, and arrhythmia.  Iron overload also increases risk of certain bacterial infections. - We have discussed lifestyle modifications such as avoiding alcohol and iron supplementation.  Recent guidelines do not find that moderate red meat intake adversely affects patients with hemochromatosis.  Patient should avoid raw fish or undercooked meat due to risk of certain bacterial infections to thrive and iron rich environments. - We have discussed phlebotomy protocol for treatment of hemochromatosis: Weekly phlebotomy as tolerated until goal ferritin is reached. If Hgb drops < 11.0, we will decrease frequency of phlebotomy. If patient experiences severe side effects or hemodynamic instability, we will decrease amount taken for phlebotomy. Goal is ferritin < 50 for normalization of iron stores. Once iron stores have normalized, we will decrease frequency of phlebotomy. - Recommend resuming weekly phlebotomies as his iron levels have increased. -She is scheduled for phlebotomy on 04/06/2024. -Recommend weekly CBC + phlebotomy (as long as Hgb >11.0)  - Labs and RTC in 4 months.

## 2024-04-04 NOTE — Progress Notes (Signed)
 Howard [name] OFFICE PROGRESS NOTE  Howard Satterfield, MD  I connected with Howard [name] on 04/04/24 at 10:45 AM EST by telephone visit and verified that I am speaking with the correct person using two identifiers.   I discussed the limitations, risks, security and privacy concerns of performing an evaluation and management service by telemedicine and the availability of in-person appointments. I also discussed with the patient that there may be a patient responsible charge related to this service. The patient expressed understanding and agreed to proceed.   Other persons participating in the visit and their role in the encounter: NP, Patient    Patient's location: Home  Provider's location: Clinic     ASSESSMENT & PLAN:  Assessment & Plan Hereditary hemochromatosis  - Most iron panel 03/23/2024 show iron saturation 90%, TIBC 207, hemoglobin 12.8, hematocrit 37.6, ferritin 503 and unremarkable CMP. - Have discussed with patient the natural course of untreated hereditary hemochromatosis with risk of cirrhosis, hepatocellular carcinoma, heart failure, and arrhythmia.  Iron overload also increases risk of certain bacterial infections. - We have discussed lifestyle modifications such as avoiding alcohol and iron supplementation.  Recent guidelines do not find that moderate red meat intake adversely affects patients with hemochromatosis.  Patient should avoid raw fish or undercooked meat due to risk of certain bacterial infections to thrive and iron rich environments. - We have discussed phlebotomy protocol for treatment of hemochromatosis: Weekly phlebotomy as tolerated until goal ferritin is reached. If Hgb drops < 11.0, we will decrease frequency of phlebotomy. If patient experiences severe side effects or hemodynamic instability, we will decrease amount taken for phlebotomy. Goal is ferritin < 50 for normalization of iron stores. Once iron stores have normalized, we will  decrease frequency of phlebotomy. - Recommend resuming weekly phlebotomies as Howard [name] iron levels have increased. -She is scheduled for phlebotomy on 04/06/2024. -Recommend weekly CBC + phlebotomy (as long as Hgb >11.0)  - Labs and RTC in 4 months.   Orders Placed This Encounter  Procedures   CBC    Standing Status:   Standing    Number of Occurrences:   20    Expiration Date:   04/04/2025   Comprehensive metabolic panel    Standing Status:   Future    Expected Date:   08/02/2024    Expiration Date:   10/31/2024   CBC with Differential/Platelet    Standing Status:   Future    Expected Date:   08/02/2024    Expiration Date:   10/31/2024    Release to patient:   Immediate   Iron and TIBC    Standing Status:   Future    Expected Date:   08/02/2024    Expiration Date:   10/31/2024    Release to patient:   Immediate   Ferritin    Standing Status:   Future    Expected Date:   08/02/2024    Expiration Date:   10/31/2024    Release to patient:   Immediate    INTERVAL HISTORY: Patient returns for follow-up.  At Howard [name] last visit, we tried to start every other week phlebotomies versus weekly and unfortunately, Howard [name] iron levels increased.  Overall, he is feeling well.  Appetite 75% energy levels are 50%.  He has some new onset right sided lower abdomen pain that he has spoke to [facility] [name] GI specialist about.  At this point, they are not doing anything.  He has chronic constipation.  He also had that right  hip replaced so he is concerned that it may be due to that.  Reports headaches that are chronic, numbness and burning in Howard [name] hands and anxiety.  Reports he has been referred to a new psychiatrist and is also trying to find a new PCP now that [facility] close down.  States he has ADHD and anxiety and previously was prescribed Xanax .  He denies any interval hospitalizations, surgeries or changes to Howard [name] baseline health.  We reviewed CBC, ferritin, CMP and iron panel.  SUMMARY OF HEMATOLOGIC HISTORY: Oncology  History   No history exists.   1.  Hereditary hemochromatosis, C282Y homozygous - Seen at the request of Dr. Cindie (gastroenterology) for further evaluation and management of hemochromatosis, which was discovered during workup of transaminitis. Iron panel (04/07/2022) the with ferritin 2828, iron saturation 95%. Hemochromatosis DNA testing revealed C282Y homozygosity (04/19/2022) MRI abdomen (05/24/2022) consistent with severe diffuse hepatic iron deposition disease, without evidence of iron deposition within the spleen, pancreas, or bone marrow. - No family history of hemochromatosis. - Symptomatic iron overload with fatigue and arthralgia.  Symptoms have improved after starting phlebotomy.   - Weekly phlebotomy started in January 2024.  Tolerating phlebotomy fairly well.   2.  Mild macrocytic anemia - Noted to have mild normocytic anemia (prior to starting phlebotomy), with Hgb 12.5/MCV 97.7 on 06/07/2022 - Macrocytosis was not present until after patient started receiving regular phlebotomies - Anemia workup (07/15/2022): Normal B12 at 548, normal MMA.  Normal folate. CMP shows normal kidney function. SPEP, immunofixation, light chains negative for any monoclonal gammopathy - Reticulocytes somewhat low for degree of anemia at 0.7%  - Currently receiving weekly phlebotomy for hereditary hemochromatosis, denies any other sources of blood loss.   - We will continue to monitor and consider additional testing if any severe worsening of anemia out of proportion to regular phlebotomies.   3.   Tobacco use - Current active smoker, 1 PPD since age 5 - Receives regular CT scans via cardiothoracic/cardiology for monitoring of bicuspid aorta and thoracic aortic aneurysm - CTA chest (02/21/2023) negative for any signs of pulmonary malignancy - We discussed importance of smoking cessation and will continue to follow-up on this at future visits. - Patient qualifies for LDCT scan, but this is unnecessary  as he is currently receiving regular cardiac/chest imaging as above.   4.  Other history - PMH: Psoriatic arthritis (on chronic methotrexate), fatty liver disease, history of alcoholism, GERD, chronic pain, bicuspid aorta with thoracic aortic aneurysm, anxiety/depression, osteoarthritis.  Last colonoscopy was in 2021, with recommendations for repeat colonoscopy in 10 years.  - SOCIAL: Lives at home with Howard [name] wife. He is currently on disability due to Howard [name] psoriatic arthritis, but previously worked in airline pilot. He has a history of alcoholism (drank heavily from age 56 to age 83), but has been sober since 2019. He also reports history of illicit drug use (cocaine, marijuana, opioid pills, fentanyl ), but has been clean for the past 4 years. He is a current active smoker, 1 PPD since age 35.  - FAMILY: No family history of hemochromatosis, blood problems, cancer    CBC    Component Value Date/Time   WBC 5.7 03/23/2024 1159   RBC 3.76 (L) 03/23/2024 1159   HGB 12.8 (L) 03/23/2024 1159   HGB 12.3 (L) 06/15/2023 1038   HCT 37.6 (L) 03/23/2024 1159   HCT 38.4 06/15/2023 1038   PLT 169 03/23/2024 1159   PLT 191 06/15/2023 1038   MCV 100.0 03/23/2024 1159  MCV 104 (H) 06/15/2023 1038   MCH 34.0 03/23/2024 1159   MCHC 34.0 03/23/2024 1159   RDW 13.3 03/23/2024 1159   RDW 12.0 06/15/2023 1038   LYMPHSABS 1.1 03/23/2024 1159   LYMPHSABS 2.2 06/15/2023 1038   MONOABS 0.3 03/23/2024 1159   EOSABS 0.1 03/23/2024 1159   EOSABS 0.1 06/15/2023 1038   BASOSABS 0.0 03/23/2024 1159   BASOSABS 0.1 06/15/2023 1038       Latest Ref Rng & Units 03/23/2024   11:59 AM 12/30/2023   10:38 AM 09/30/2023   10:20 AM  CMP  Glucose 70 - 99 mg/dL 867  880  885   BUN 6 - 20 mg/dL 17  28  18    Creatinine 0.61 - 1.24 mg/dL 9.41  9.43  9.31   Sodium 135 - 145 mmol/L 139  137  131   Potassium 3.5 - 5.1 mmol/L 4.5  4.2  4.1   Chloride 98 - 111 mmol/L 103  104  98   CO2 22 - 32 mmol/L 28  26  26    Calcium  8.9 - 10.3  mg/dL 8.8  8.7  8.7   Total Protein 6.5 - 8.1 g/dL 5.8  5.6  6.0   Total Bilirubin 0.0 - 1.2 mg/dL 0.6  0.7  1.2   Alkaline Phos 38 - 126 U/L 91  89  90   AST 15 - 41 U/L 43  29  34   ALT 0 - 44 U/L 44  29  31      Lab Results  Component Value Date   FERRITIN 503 (H) 03/23/2024   VITAMINB12 548 07/15/2022    There were no vitals filed for this visit.   Review of System:  Review of Systems  Constitutional:  Positive for malaise/fatigue.  Gastrointestinal:  Positive for abdominal pain and constipation.  Musculoskeletal:  Positive for joint pain.  Neurological:  Positive for tingling and headaches.  Psychiatric/Behavioral:  The patient is nervous/anxious.     Physical Exam: Physical Exam Neurological:     Mental Status: He is alert and oriented to person, place, and time.   I provided 18 minutes of non face-to-face telephone visit time during this encounter, and > 50% was spent counseling as documented under my assessment & plan.  Delon Hope, NP 04/04/2024 11:23 AM

## 2024-04-06 ENCOUNTER — Inpatient Hospital Stay

## 2024-04-06 LAB — CBC
HCT: 37.5 % — ABNORMAL LOW (ref 39.0–52.0)
Hemoglobin: 12.6 g/dL — ABNORMAL LOW (ref 13.0–17.0)
MCH: 33.5 pg (ref 26.0–34.0)
MCHC: 33.6 g/dL (ref 30.0–36.0)
MCV: 99.7 fL (ref 80.0–100.0)
Platelets: 194 K/uL (ref 150–400)
RBC: 3.76 MIL/uL — ABNORMAL LOW (ref 4.22–5.81)
RDW: 13.3 % (ref 11.5–15.5)
WBC: 4.4 K/uL (ref 4.0–10.5)
nRBC: 0 % (ref 0.0–0.2)

## 2024-04-06 NOTE — Progress Notes (Signed)
 Howard [name] presents for therapeutic phlebotomy per MD orders. Last HGB 12.6 / HCT 37.5 on 04-06-2024 . Vital signs stable prior to procedure. Procedure started at 12:02 pm and ended at 12:07 pm. 500 mls of blood removed. Patient denies any dizziness , lightheadedness, or feeling faint.  Gauze and coban applied to site. Vital signs stable at completion of procedure. Patient has no complaints at this time. Alert and oriented x 3. Discharged in stable condition.

## 2024-04-06 NOTE — Patient Instructions (Signed)
 [facility] [place] - [facility]  Discharge Instructions: Thank you for choosing [facility] to provide your oncology and hematology care.  If you have a lab appointment with the Cancer Center - please note that after April 8th, 2024, all labs will be drawn in the cancer center.  You do not have to check in or register with the main entrance as you have in the past but will complete your check-in in the cancer center.  Wear comfortable clothing and clothing appropriate for easy access to any Portacath or PICC line.   We strive to give you quality time with your provider. You may need to reschedule your appointment if you arrive late (15 or more minutes).  Arriving late affects you and other patients whose appointments are after yours.  Also, if you miss three or more appointments without notifying the office, you may be dismissed from the clinic at the provider's discretion.      For prescription refill requests, have your pharmacy contact our office and allow 72 hours for refills to be completed.    Today you received the following chemotherapy and/or immunotherapy agents phlebotomy. Therapeutic Phlebotomy, Care After The following information offers guidance on how to care for yourself after your procedure. Your health care provider may also give you more specific instructions. If you have problems or questions, contact your health care provider. What can I expect after the procedure? After therapeutic phlebotomy, it is common to have: Light-headedness or dizziness. You may feel faint. Nausea. Tiredness (fatigue). Follow these instructions at home: Eating and drinking Be sure to eat well-balanced meals for the next 24 hours. Drink enough fluid to keep your urine pale yellow. Avoid drinking alcohol on the day that you had the procedure. Activity  Return to your normal activities as told by your health care provider. Most people can go back  to their normal activities right away. Avoid activities that take a lot of effort for about 5 hours after the procedure. Athletes should avoid strenuous exercise for at least 12 hours. Avoid heavy lifting or pulling for about 5 hours after the procedure. Do not lift anything that is heavier than 10 lb (4.5 kg). Change positions slowly for the remainder of the day, like from sitting to standing. This can help prevent light-headedness or fainting. If you feel light-headed, lie down until the feeling goes away. Needle insertion site care  Keep your bandage (dressing) dry. You can remove the bandage after about 5 hours or as told by your health care provider. If you have bleeding from the needle insertion site, raise (elevate) your arm and press firmly on the site until the bleeding stops. If you have bruising at the site, apply ice to the area. To do this: Put ice in a plastic bag. Place a towel between your skin and the bag. Leave the ice on for 20 minutes, 2-3 times a day for the first 24 hours. Remove the ice if your skin turns bright red so you do not damage the area. If the swelling does not go away after 24 hours, apply a warm, moist cloth (warm compress) to the area for 20 minutes, 2-3 times a day. General instructions Do not use any products that contain nicotine or tobacco, like cigarettes, chewing tobacco, and vaping devices, such as e-cigarettes, for at least 30 minutes after the procedure. If you need help quitting, ask your health care provider. Keep all follow-up visits.  You may need to continue having regular blood tests and therapeutic phlebotomy treatments as directed. Contact a health care provider if: You have redness, swelling, or pain at the needle insertion site. Fluid or blood is coming from the needle insertion site. Pus or a bad smell is coming from the needle insertion site. The needle insertion site feels warm to the touch. You feel light-headed, dizzy, or nauseous, and  the feeling does not go away. You have new bruising at the needle insertion site. You feel weaker than normal. You have a fever or chills. Get help right away if: You have chest pain. You have trouble breathing. You have severe nausea or vomiting. Summary After the procedure, it is common to have some light-headedness, dizziness, nausea, or tiredness (fatigue). Be sure to eat well-balanced meals for the next 24 hours. Drink enough fluid to keep your urine pale yellow. Return to your normal activities as told by your health care provider. Keep all follow-up visits. You may need to continue having regular blood tests and therapeutic phlebotomy treatments as directed. This information is not intended to replace advice given to you by your health care provider. Make sure you discuss any questions you have with your health care provider. Document Revised: 10/19/2022 Document Reviewed: 10/29/2020 Elsevier Patient Education  2024 [employer]vier Inc.      To help prevent nausea and vomiting after your treatment, we encourage you to take your nausea medication as directed.  BELOW ARE SYMPTOMS THAT SHOULD BE REPORTED IMMEDIATELY: *FEVER GREATER THAN 100.4 F (38 C) OR HIGHER *CHILLS OR SWEATING *NAUSEA AND VOMITING THAT IS NOT CONTROLLED WITH YOUR NAUSEA MEDICATION *UNUSUAL SHORTNESS OF BREATH *UNUSUAL BRUISING OR BLEEDING *URINARY PROBLEMS (pain or burning when urinating, or frequent urination) *BOWEL PROBLEMS (unusual diarrhea, constipation, pain near the anus) TENDERNESS IN MOUTH AND THROAT WITH OR WITHOUT PRESENCE OF ULCERS (sore throat, sores in mouth, or a toothache) UNUSUAL RASH, SWELLING OR PAIN  UNUSUAL VAGINAL DISCHARGE OR ITCHING   Items with * indicate a potential emergency and should be followed up as soon as possible or go to the Emergency Department if any problems should occur.  Please show the CHEMOTHERAPY ALERT CARD or IMMUNOTHERAPY ALERT CARD at check-in to the Emergency  Department and triage nurse.  Should you have questions after your visit or need to cancel or reschedule your appointment, please contact [facility] CANCER CTR [place] - A DEPT OF Eligha Bridegroom [facility] [phone]  and follow the prompts.  Office hours are 8:00 a.m. to 4:30 p.m. Monday - Friday. Please note that voicemails left after 4:00 p.m. may not be returned until the following business day.  We are closed weekends and major holidays. You have access to a nurse at all times for urgent questions. Please call the main number to the clinic [phone] and follow the prompts.  For any non-urgent questions, you may also contact your provider using MyChart. We now offer e-Visits for anyone 28 and older to request care online for non-urgent symptoms. For details visit [email or web address].   Also download the MyChart app! Go to the app store, search "MyChart", open the app, select [place], and log in with your MyChart username and password.

## 2024-04-09 ENCOUNTER — Encounter (HOSPITAL_BASED_OUTPATIENT_CLINIC_OR_DEPARTMENT_OTHER): Payer: Self-pay

## 2024-04-09 NOTE — Progress Notes (Unsigned)
**Note Howard-Identified via Obfuscation**     Cardiology Office Note Date:  04/10/2024  ID:  Howard [name], DOB 1966/02/29, MRN [id number] PCP:  Bertell Satterfield, MD  Cardiologist:  Joelle VEAR Ren Donley, MD  Chief Complaint  Patient presents with   Coronary Artery Disease      Problems CAC on CT 01/2020 TTE 12/21: 55-60%, bicuspid aortic valve, mild to moderate AI, aortic root (4.4cm), ascending aorta (4.3cm) Coronary CTA 12/21: CAC 105, non-obstructive CAD Hemochromatosis on Phlebotomy Tobacco use; tried Chantix  in the past LDL 53 1/24 M: RN5; recommended ASA 81 during last visit (feels he bruises easily)  Visits  11/25: TTE, CTA, LP, HA1C, wellbutrin  for smoking cessation    History of Present Illness: Howard [name] is a 58 y.o. male who presents for follow up.   He has been doing well overall since the last visit.  He has not seen us  in about 2 years but denies any chest pain or dyspnea despite going to the gym every day.  He has been having trouble with quitting smoking.  He had tried a patch in the past but had some burning with it, so stopped.  He had tried Chantix  which helped but caused him to have some neuro problems.  He smokes about 10 to 12 cigarettes a day has been smoking since he was 18.  He tends to smoke more when he is in pain from his rheumatoid arthritis.  ROS: Please see the history of present illness. All other systems are reviewed and negative.   PHYSICAL EXAM: VS:  BP 119/68 (BP Location: Left Arm, Patient Position: Sitting, Cuff Size: Normal)   Pulse 78   Ht 5' 7 (1.702 m)   Wt 152 lb (68.9 kg)   SpO2 98%   BMI 23.81 kg/m  , BMI Body mass index is 23.81 kg/m. GEN: Well nourished, well developed, in no acute distress HEENT: normal Neck: no JVD, carotid bruits, or masses Cardiac: RRR; no murmurs, rubs, or gallops,no edema  Respiratory:  CTAB bilaterally, normal work of breathing GI: soft, nontender, nondistended, + BS Extremities: No LE edema Skin: warm and dry, no rash Neuro:   Strength and sensation are intact  Recent Labs: Reviewed  Studies: Reviewed  ASSESSMENT AND PLAN: Howard [name] is a 58 y.o. male who presents for follow up. - Chronic tobacco use, we will start Wellbutrin . - Given echo with dilated aorta and aortic regurg in the past, will obtain a chest CTA and 2D echocardiogram. - He is also due for lipid check and A1c. - Follow up in 1 year.    Signed, Joelle VEAR Ren Donley, MD  04/10/2024 11:43 AM    [facility]

## 2024-04-10 ENCOUNTER — Ambulatory Visit

## 2024-04-10 ENCOUNTER — Other Ambulatory Visit: Payer: Self-pay

## 2024-04-10 VITALS — BP 119/68 | HR 78 | Ht 67.0 in | Wt 152.0 lb

## 2024-04-10 DIAGNOSIS — I7121 Aneurysm of the ascending aorta, without rupture: Secondary | ICD-10-CM | POA: Diagnosis not present

## 2024-04-10 DIAGNOSIS — I2510 Atherosclerotic heart disease of native coronary artery without angina pectoris: Secondary | ICD-10-CM

## 2024-04-10 DIAGNOSIS — I351 Nonrheumatic aortic (valve) insufficiency: Secondary | ICD-10-CM | POA: Diagnosis not present

## 2024-04-10 MED ORDER — BUPROPION HCL ER (SR) 150 MG PO TB12: ORAL_TABLET | ORAL | 3 refills | Status: DC

## 2024-04-10 NOTE — Patient Instructions (Signed)
 Medication Instructions:  Your physician has recommended you make the following change in your medication:  1) START taking Welbutrin 150 mg once daily for three days, then increase to 150 mg twice daily thereafter  *If you need a refill on your cardiac medications before your next appointment, please call your pharmacy*  Lab Work: HgbA1C and FLP - please go to [facility] on the 1st floor to have these drawn  Testing/Procedures: Echocardiogram  Your physician has requested that you have an echocardiogram. Echocardiography is a painless test that uses sound waves to create images of your heart. It provides your doctor with information about the size and shape of your heart and how well your heart's chambers and valves are working. This procedure takes approximately one hour. There are no restrictions for this procedure. Please do NOT wear cologne, perfume, aftershave, or lotions (deodorant is allowed). Please arrive 15 minutes prior to your appointment time.  Please note: We ask at that you not bring children with you during ultrasound (echo/ vascular) testing. Due to room size and safety concerns, children are not allowed in the ultrasound rooms during exams. Our front office staff cannot provide observation of children in our lobby area while testing is being conducted. An adult accompanying a patient to their appointment will only be allowed in the ultrasound room at the discretion of the ultrasound technician under special circumstances. We apologize for any inconvenience.  Chest CTA Non-Cardiac CT Angiography (CTA), is a special type of CT scan that uses a computer to produce multi-dimensional views of major blood vessels throughout the body. In CT angiography, a contrast material is injected through an IV to help visualize the blood vessels   Follow-Up: At [facility], you and your health needs are our priority.  As part of our continuing mission to provide you with exceptional heart  care, our providers are all part of one team.  This team includes your primary Cardiologist (physician) and Advanced Practice Providers or APPs (Physician Assistants and Nurse Practitioners) who all work together to provide you with the care you need, when you need it.  Your next appointment:   1 year  Provider:   Joelle VEAR Ren Donley, MD

## 2024-04-14 ENCOUNTER — Emergency Department (HOSPITAL_COMMUNITY)

## 2024-04-14 ENCOUNTER — Other Ambulatory Visit: Payer: Self-pay

## 2024-04-14 ENCOUNTER — Emergency Department (HOSPITAL_COMMUNITY): Admission: EM | Admit: 2024-04-14 | Discharge: 2024-04-14 | Disposition: A | Discharge status: Home / Self Care

## 2024-04-14 ENCOUNTER — Encounter (HOSPITAL_COMMUNITY): Payer: Self-pay | Admitting: *Deleted

## 2024-04-14 DIAGNOSIS — R1031 Right lower quadrant pain: Secondary | ICD-10-CM | POA: Diagnosis present

## 2024-04-14 LAB — CBC WITH DIFFERENTIAL/PLATELET
Abs Immature Granulocytes: 0.04 K/uL (ref 0.00–0.07)
Basophils Absolute: 0.1 K/uL (ref 0.0–0.1)
Basophils Relative: 1 %
Eosinophils Absolute: 0 K/uL (ref 0.0–0.5)
Eosinophils Relative: 0 %
HCT: 44.8 % (ref 39.0–52.0)
Hemoglobin: 14.1 g/dL (ref 13.0–17.0)
Immature Granulocytes: 1 %
Lymphocytes Relative: 26 %
Lymphs Abs: 1.8 K/uL (ref 0.7–4.0)
MCH: 34.1 pg — ABNORMAL HIGH (ref 26.0–34.0)
MCHC: 31.5 g/dL (ref 30.0–36.0)
MCV: 108.5 fL — ABNORMAL HIGH (ref 80.0–100.0)
Monocytes Absolute: 0.5 K/uL (ref 0.1–1.0)
Monocytes Relative: 6 %
Neutro Abs: 4.7 K/uL (ref 1.7–7.7)
Neutrophils Relative %: 66 %
Platelets: 180 K/uL (ref 150–400)
RBC: 4.13 MIL/uL — ABNORMAL LOW (ref 4.22–5.81)
RDW: 13.5 % (ref 11.5–15.5)
WBC: 7.1 K/uL (ref 4.0–10.5)
nRBC: 0 % (ref 0.0–0.2)

## 2024-04-14 LAB — COMPREHENSIVE METABOLIC PANEL WITH GFR
ALT: 24 U/L (ref 0–44)
AST: 24 U/L (ref 15–41)
Albumin: 4.1 g/dL (ref 3.5–5.0)
Alkaline Phosphatase: 75 U/L (ref 38–126)
Anion gap: 12 (ref 5–15)
BUN: 15 mg/dL (ref 6–20)
CO2: 22 mmol/L (ref 22–32)
Calcium: 8.7 mg/dL — ABNORMAL LOW (ref 8.9–10.3)
Chloride: 105 mmol/L (ref 98–111)
Creatinine, Ser: 0.88 mg/dL (ref 0.61–1.24)
GFR, Estimated: >60 mL/min (ref >60)
Glucose, Bld: 95 mg/dL (ref 70–99)
Potassium: 4.1 mmol/L (ref 3.5–5.1)
Sodium: 139 mmol/L (ref 135–145)
Total Bilirubin: 0.6 mg/dL (ref 0.0–1.2)
Total Protein: 5.9 g/dL — ABNORMAL LOW (ref 6.5–8.1)

## 2024-04-14 LAB — I-STAT CHEM 8, ED
BUN: 16 mg/dL (ref 6–20)
Calcium, Ion: 1.02 mmol/L — ABNORMAL LOW (ref 1.15–1.40)
Chloride: 105 mmol/L (ref 98–111)
Creatinine, Ser: 0.6 mg/dL — ABNORMAL LOW (ref 0.61–1.24)
Glucose, Bld: 92 mg/dL (ref 70–99)
HCT: 43 % (ref 39.0–52.0)
Hemoglobin: 14.6 g/dL (ref 13.0–17.0)
Potassium: 3.9 mmol/L (ref 3.5–5.1)
Sodium: 138 mmol/L (ref 135–145)
TCO2: 25 mmol/L (ref 22–32)

## 2024-04-14 MED ORDER — HYDROMORPHONE HCL 1 MG/ML IJ SOLN
1.0000 mg | Freq: Once | INTRAMUSCULAR | Status: AC
Start: 2024-04-14 — End: 2024-04-14
  Administered 2024-04-14: 1 mg via INTRAVENOUS
  Filled 2024-04-14: qty 1

## 2024-04-14 MED ORDER — ONDANSETRON HCL 4 MG/2ML IJ SOLN
4.0000 mg | Freq: Once | INTRAMUSCULAR | Status: AC
  Administered 2024-04-14: 4 mg via INTRAVENOUS
  Filled 2024-04-14: qty 2

## 2024-04-14 MED ORDER — LORAZEPAM 2 MG/ML IJ SOLN
1.0000 mg | Freq: Once | INTRAMUSCULAR | Status: AC
Start: 2024-04-14 — End: 2024-04-14
  Administered 2024-04-14: 1 mg via INTRAVENOUS
  Filled 2024-04-14: qty 1

## 2024-04-14 MED ORDER — IOHEXOL 350 MG/ML SOLN
75.0000 mL | Freq: Once | INTRAVENOUS | Status: AC | PRN
  Administered 2024-04-14: 75 mL via INTRAVENOUS

## 2024-04-14 NOTE — ED Triage Notes (Signed)
 States he has a known  hernia lower abd. States 2 years ago he had it repaired, c/o abd. Pain c/o nausea , last BM this am however states it  was just a small amt.

## 2024-04-14 NOTE — ED Provider Notes (Signed)
 [place] EMERGENCY DEPARTMENT AT [facility] Provider Note   CSN: [id number] Arrival date & time: 04/14/24  9165     Patient presents with: Abdominal Pain   Howard [name] is a 58 y.o. male.   Patient complains of abdominal pain.  Patient reports he has a hernia in his lower abdomen that causes him pain.  Patient states that he had a hernia repair 2 years ago by Dr. Mavis however the area is still painful and still bulges out.  Patient states recent increase in pain.  Patient states that he has been having a lot of anxiety recently.  Patient states that he previously was on Xanax  and Adderall and this was stopped by his new physician.  Patient states his previous primary care physician retired.  Patient has a past medical history of alcohol abuse, hernia repair, he has a history of thoracic aneurysm, elevated cholesterol and hereditary hemochromatosis cytosis.  Patient denies having any pain in his chest pain is in the lower abdomen.  The history is provided by the patient. No language interpreter was used.  Abdominal Pain      Prior to Admission medications   Medication Sig Start Date End Date Taking? Authorizing Provider  ALPRAZolam  (XANAX ) 0.5 MG tablet Take 0.5 mg by mouth 2 (two) times daily as needed for anxiety. Patient not taking: Reported on 04/10/2024    [provider]  amphetamine -dextroamphetamine  (ADDERALL) 20 MG tablet Take 20 mg by mouth 3 (three) times daily. Patient not taking: Reported on 04/10/2024    [provider]  Buprenorphine HCl-Naloxone HCl 8-2 MG FILM Place under the tongue. Patient taking differently: Place 1 Film under the tongue 3 (three) times daily as needed (pain). 05/21/22   [provider]  buPROPion  (WELLBUTRIN  SR) 150 MG 12 hr tablet Take 1 tablet (150 mg total) by mouth daily for 3 days, THEN 1 tablet (150 mg total) 2 (two) times daily. 04/10/24 04/08/25  Azobou Donley Joelle DEL, MD  folic acid  (FOLVITE ) 1  MG tablet Take 1 mg by mouth daily. 03/19/21   [provider]  Golimumab (SIMPONI ARIA IV) Inject 1 Dose into the vein every 8 (eight) weeks.    [provider]  Insulin Syringe-Needle U-100 (B-D INS SYR MICROFINE 1CC/27G) 27G X 5/8 1 ML MISC once a week for MTX; Duration: 84 days 10/11/23   [provider]  methotrexate 50 MG/2ML injection Inject 50 mg/m2 into the muscle once a week. 02/04/24   [provider]  omeprazole (PRILOSEC) 20 MG capsule Take 20 mg by mouth daily as needed (acid reflux). 02/07/22   [provider]  rosuvastatin  (CRESTOR ) 5 MG tablet Take 5 mg by mouth daily. 02/14/24   [provider]  sulfaSALAzine (AZULFIDINE) 500 MG tablet Take 500 mg by mouth 2 (two) times daily. 03/23/24   [provider]  tamsulosin (FLOMAX) 0.4 MG CAPS capsule Take 0.4 mg by mouth daily. 02/04/24   [provider]    Allergies: Patient has no known allergies.    Review of Systems  Gastrointestinal:  Positive for abdominal pain.  All other systems reviewed and are negative.   Updated Vital Signs BP (!) 143/82   Pulse 63   Temp 98 F (36.7 C) (Oral)   Resp 20   Ht 5' 7 (1.702 m)   Wt 68 kg   SpO2 100%   BMI 23.49 kg/m   Physical Exam Vitals and nursing note reviewed.  Constitutional:  Appearance: He is well-developed.  HENT:     Head: Normocephalic.  Eyes:     Extraocular Movements: Extraocular movements intact.     Pupils: Pupils are equal, round, and reactive to light.  Cardiovascular:     Rate and Rhythm: Normal rate.  Pulmonary:     Effort: Pulmonary effort is normal.  Abdominal:     General: Abdomen is flat. Bowel sounds are normal. There is no distension.     Palpations: Abdomen is soft.     Tenderness: There is abdominal tenderness.     Comments: Tender right inguinal area, no evidence of incarcerated hernia,  Musculoskeletal:        General: Normal range of motion.     Cervical back: Normal  range of motion.  Skin:    General: Skin is warm.  Neurological:     General: No focal deficit present.     Mental Status: He is alert and oriented to person, place, and time.     (all labs ordered are listed, but only abnormal results are displayed) Labs Reviewed  CBC WITH DIFFERENTIAL/PLATELET - Abnormal; Notable for the following components:      Result Value   RBC 4.13 (*)    MCV 108.5 (*)    MCH 34.1 (*)    All other components within normal limits  I-STAT CHEM 8, ED - Abnormal; Notable for the following components:   Creatinine, Ser 0.60 (*)    Calcium , Ion 1.02 (*)    All other components within normal limits  COMPREHENSIVE METABOLIC PANEL WITH GFR    EKG: None  Radiology: No results found.   Procedures   Medications Ordered in the ED  HYDROmorphone  (DILAUDID ) injection 1 mg (1 mg Intravenous Given 04/14/24 1209)  ondansetron  (ZOFRAN ) injection 4 mg (4 mg Intravenous Given 04/14/24 1209)  LORazepam  (ATIVAN ) injection 1 mg (1 mg Intravenous Given 04/14/24 1511)                                    Medical Decision Making Complains of lower abdominal pain at the site that he has had a hernia and a hernia repair.  Patient states that after the repair hernia has returned.  Amount and/or Complexity of Data Reviewed Labs: ordered. Decision-making details documented in ED Course.    Details: Lab was ordered reviewed and interpreted.  Patient has a normal white blood cell count Radiology: ordered and independent interpretation performed. Decision-making details documented in ED Course.    Details: CT abdomen ordered reviewed and interpreted  Risk Prescription drug management. Risk Details: Patient's care turned over to [facility] pending ct scan        Final diagnoses:  Right lower quadrant abdominal pain    ED Discharge Orders     None          Flint Sonny POUR, [place] 04/14/24 1520    [address], Megan L, DO 04/15/24 [phone]

## 2024-04-14 NOTE — ED Provider Notes (Signed)
  Physical Exam  BP (!) 143/82   Pulse 63   Temp 98 F (36.7 C) (Oral)   Resp 20   Ht 5' 7 (1.702 m)   Wt 68 kg   SpO2 100%   BMI 23.49 kg/m   Physical Exam  Procedures  Procedures  ED Course / MDM    Medical Decision Making Amount and/or Complexity of Data Reviewed Labs: ordered. Radiology: ordered.  Risk Prescription drug management.   C/o lower AP at repaired hernia Out of adderal and Xanax  He is on suboxone Has anxiety, uncontrolled Known aneurysm but no related symptoms today  CT pending  CT abd/pel per radiology:  IMPRESSION: 1. No acute abdominopelvic findings. 2. Very small fat-containing paraumbilical hernia. 3.  Aortic Atherosclerosis (ICD10-I70.0).  Patient and family/friend at bedside updated on results of CT. He appears well, in NAD. No further stated needs. Recommend Miralax  for the next 2 days and PCP follow up to insure resolution of symptoms.        Odell Balls, PA-C 04/14/24 1635    Kammerer, Megan L, DO 04/15/24 [phone]

## 2024-04-14 NOTE — Discharge Instructions (Addendum)
 As we discussed, try Miralax  3 times daily for the next 2 days and follow up with your doctor for recheck later in the week. Return to the ED with any new or concerning symptoms at any time.

## 2024-04-16 ENCOUNTER — Inpatient Hospital Stay

## 2024-04-16 ENCOUNTER — Inpatient Hospital Stay: Attending: Hematology

## 2024-04-27 ENCOUNTER — Inpatient Hospital Stay

## 2024-04-27 ENCOUNTER — Inpatient Hospital Stay: Attending: Hematology

## 2024-04-27 LAB — CBC
HCT: 39 % (ref 39.0–52.0)
Hemoglobin: 13.2 g/dL (ref 13.0–17.0)
MCH: 34.7 pg — ABNORMAL HIGH (ref 26.0–34.0)
MCHC: 33.8 g/dL (ref 30.0–36.0)
MCV: 102.6 fL — ABNORMAL HIGH (ref 80.0–100.0)
Platelets: 171 K/uL (ref 150–400)
RBC: 3.8 MIL/uL — ABNORMAL LOW (ref 4.22–5.81)
RDW: 13.3 % (ref 11.5–15.5)
WBC: 6.7 K/uL (ref 4.0–10.5)
nRBC: 0 % (ref 0.0–0.2)

## 2024-04-27 NOTE — Progress Notes (Signed)
 Kalup R Maish presents today for phlebotomy per MD orders. Labs reviewed and pt agrees to have phlebotomy.  Phlebotomy procedure started at 1240 and ended at 1250 500 cc removed. Patient tolerated procedure well. IV needle removed intact. Pt declined to be observed post phlebotomy. VSS. Pt stable at discharge.  Howard [name]

## 2024-05-04 ENCOUNTER — Inpatient Hospital Stay

## 2024-05-04 LAB — CBC
HCT: 37 % — ABNORMAL LOW (ref 39.0–52.0)
Hemoglobin: 12.5 g/dL — ABNORMAL LOW (ref 13.0–17.0)
MCH: 34.2 pg — ABNORMAL HIGH (ref 26.0–34.0)
MCHC: 33.8 g/dL (ref 30.0–36.0)
MCV: 101.1 fL — ABNORMAL HIGH (ref 80.0–100.0)
Platelets: 173 K/uL (ref 150–400)
RBC: 3.66 MIL/uL — ABNORMAL LOW (ref 4.22–5.81)
RDW: 13.4 % (ref 11.5–15.5)
WBC: 5.4 K/uL (ref 4.0–10.5)
nRBC: 0 % (ref 0.0–0.2)

## 2024-05-04 NOTE — Progress Notes (Signed)
 Howard [name] presents today for phlebotomy per MD orders. Phlebotomy procedure started at 1300 and ended at 1310. 500 cc removed. Patient tolerated procedure well. IV needle removed intact.  Patient tolerated phlebotomy with no complaints voiced.  Peripheral IV site intact with no bruising or swelling noted.  Denied SOB, chest pain, or dizziness.  Gauze with coban applied.  Discharged with VSS and no s/s of distress noted.

## 2024-05-04 NOTE — Patient Instructions (Signed)

## 2024-05-14 ENCOUNTER — Encounter: Payer: Self-pay | Admitting: *Deleted

## 2024-05-14 ENCOUNTER — Inpatient Hospital Stay

## 2024-05-15 ENCOUNTER — Inpatient Hospital Stay

## 2024-05-15 LAB — CBC
HCT: 34.3 % — ABNORMAL LOW (ref 39.0–52.0)
Hemoglobin: 11.2 g/dL — ABNORMAL LOW (ref 13.0–17.0)
MCH: 33.6 pg (ref 26.0–34.0)
MCHC: 32.7 g/dL (ref 30.0–36.0)
MCV: 103 fL — ABNORMAL HIGH (ref 80.0–100.0)
Platelets: 165 K/uL (ref 150–400)
RBC: 3.33 MIL/uL — ABNORMAL LOW (ref 4.22–5.81)
RDW: 13.8 % (ref 11.5–15.5)
WBC: 5.7 K/uL (ref 4.0–10.5)
nRBC: 0 % (ref 0.0–0.2)

## 2024-05-15 NOTE — Progress Notes (Signed)
 Patient declined his Phlebotomy today.  Hgb 11.2.  Patient states that he feels fine and he would like to reschedule.  Patient rescheduled for 05/21/24.

## 2024-05-21 ENCOUNTER — Inpatient Hospital Stay

## 2024-05-21 ENCOUNTER — Inpatient Hospital Stay: Attending: Hematology

## 2024-05-21 LAB — CBC
HCT: 36.9 % — ABNORMAL LOW (ref 39.0–52.0)
Hemoglobin: 12.5 g/dL — ABNORMAL LOW (ref 13.0–17.0)
MCH: 34.5 pg — ABNORMAL HIGH (ref 26.0–34.0)
MCHC: 33.9 g/dL (ref 30.0–36.0)
MCV: 101.9 fL — ABNORMAL HIGH (ref 80.0–100.0)
Platelets: 158 K/uL (ref 150–400)
RBC: 3.62 MIL/uL — ABNORMAL LOW (ref 4.22–5.81)
RDW: 13.5 % (ref 11.5–15.5)
WBC: 6.5 K/uL (ref 4.0–10.5)
nRBC: 0 % (ref 0.0–0.2)

## 2024-05-21 NOTE — Patient Instructions (Signed)
 [facility] [place] [facility]  Discharge Instructions: Thank you for choosing [facility] to provide your oncology and hematology care.  If you have a lab appointment with the Cancer Center - please note that after April 8th, 2024, all labs will be drawn in the cancer center.  You do not have to check in or register with the main entrance as you have in the past but will complete your check-in in the cancer center.  Wear comfortable clothing and clothing appropriate for easy access to any Portacath or PICC line.   We strive to give you quality time with your provider. You may need to reschedule your appointment if you arrive late (15 or more minutes).  Arriving late affects you and other patients whose appointments are after yours.  Also, if you miss three or more appointments without notifying the office, you may be dismissed from the clinic at the provider's discretion.      For prescription refill requests, have your pharmacy contact our office and allow 72 hours for refills to be completed.    Today you received the following chemotherapy and/or immunotherapy agents Phlebotomy      To help prevent nausea and vomiting after your treatment, we encourage you to take your nausea medication as directed.  BELOW ARE SYMPTOMS THAT SHOULD BE REPORTED IMMEDIATELY: *FEVER GREATER THAN 100.4 F (38 C) OR HIGHER *CHILLS OR SWEATING *NAUSEA AND VOMITING THAT IS NOT CONTROLLED WITH YOUR NAUSEA MEDICATION *UNUSUAL SHORTNESS OF BREATH *UNUSUAL BRUISING OR BLEEDING *URINARY PROBLEMS (pain or burning when urinating, or frequent urination) *BOWEL PROBLEMS (unusual diarrhea, constipation, pain near the anus) TENDERNESS IN MOUTH AND THROAT WITH OR WITHOUT PRESENCE OF ULCERS (sore throat, sores in mouth, or a toothache) UNUSUAL RASH, SWELLING OR PAIN  UNUSUAL VAGINAL DISCHARGE OR ITCHING   Items with * indicate a potential emergency and should be followed  up as soon as possible or go to the Emergency Department if any problems should occur.  Please show the CHEMOTHERAPY ALERT CARD or IMMUNOTHERAPY ALERT CARD at check-in to the Emergency Department and triage nurse.  Should you have questions after your visit or need to cancel or reschedule your appointment, please contact [facility] CANCER CTR [place] - A DEPT OF Eligha Bridegroom [facility] [phone]  and follow the prompts.  Office hours are 8:00 a.m. to 4:30 p.m. Monday - Friday. Please note that voicemails left after 4:00 p.m. may not be returned until the following business day.  We are closed weekends and major holidays. You have access to a nurse at all times for urgent questions. Please call the main number to the clinic [phone] and follow the prompts.  For any non-urgent questions, you may also contact your provider using MyChart. We now offer e-Visits for anyone 11 and older to request care online for non-urgent symptoms. For details visit [email or web address].   Also download the MyChart app! Go to the app store, search "MyChart", open the app, select [place], and log in with your MyChart username and password.

## 2024-05-21 NOTE — Progress Notes (Signed)
 Patient presents today for per MD orders. Phlebotomy procedure started at 1437 and ended at 1444. 500 cc removed. Patient tolerated procedure well. Procedure tolerated well and without incident. Discharged ambulatory in stable condition.

## 2024-05-24 ENCOUNTER — Encounter: Payer: Self-pay | Admitting: General Surgery

## 2024-05-24 ENCOUNTER — Ambulatory Visit: Admitting: General Surgery

## 2024-05-24 VITALS — BP 129/74 | HR 72 | Temp 97.8°F | Resp 14 | Ht 67.0 in | Wt 164.0 lb

## 2024-05-24 DIAGNOSIS — K4090 Unilateral inguinal hernia, without obstruction or gangrene, not specified as recurrent: Secondary | ICD-10-CM

## 2024-05-25 NOTE — Progress Notes (Signed)
 Howard [name]; [id number]; July 21, 1965   HPI Patient is a 59 year old white male who is referred to my care by Norman Larve for evaluation treatment of a left inguinal hernia.  Patient is status post a robotic assisted laparoscopic right inguinal herniorrhaphy in 2023.  He states he recently developed a left inguinal hernia and it does cause him discomfort.  It is made worse with straining. Past Medical History:  Diagnosis Date   Alcohol abuse    Anxiety    Chronic back pain    Chronic pain    Depression    Fall    Fracture of right elbow    Hereditary hemochromatosis 06/01/2022   Osteoarthritis    Psoriasis     Past Surgical History:  Procedure Laterality Date   APPENDECTOMY     COLONOSCOPY WITH PROPOFOL  N/A 06/07/2019   diverticulosis, otherwise normal. Non-bleeding internal hemorrhoids.    FOOT SURGERY     per patient: neurofibroma excision   LUMBAR LAMINECTOMY/DECOMPRESSION MICRODISCECTOMY Bilateral 12/31/2015   Procedure: LUMBER DECOMPRESSION L4-5/ L5-S1 BILATERALLY  ;  Surgeon: Reyes Billing, MD;  Location: [facility];  Service: Orthopedics;  Laterality: Bilateral;   REVERSE SHOULDER ARTHROPLASTY Right 10/24/2018   REVERSE SHOULDER ARTHROPLASTY Right 10/24/2018   Procedure: REVERSE SHOULDER ARTHROPLASTY;  Surgeon: Sharl Selinda Dover, MD;  Location: [facility] OR;  Service: Orthopedics;  Laterality: Right;   SHOULDER SURGERY Right    TOTAL HIP ARTHROPLASTY Right    TOTAL HIP ARTHROPLASTY Left 08/27/2014   Procedure: LEFT TOTAL HIP ARTHROPLASTY ANTERIOR APPROACH;  Surgeon: Donnice Car, MD;  Location: [facility];  Service: Orthopedics;  Laterality: Left;   XI ROBOTIC ASSISTED INGUINAL HERNIA REPAIR WITH MESH Right 05/12/2022   Procedure: XI ROBOTIC ASSISTED INGUINAL HERNIA REPAIR WITH MESH, RIGHT;  Surgeon: Mavis Anes, MD;  Location: [facility];  Service: General;  Laterality: Right;    Family History  Problem Relation Age of Onset   Heart attack Mother    Colon cancer Neg Hx      Medications Ordered Prior to Encounter[1]  Allergies[2]  Social History   Substance and Sexual Activity  Alcohol Use Not Currently   Alcohol/week: 16.0 standard drinks of alcohol   Types: 6 Glasses of wine, 10 Cans of beer per week   Comment: None currently (last ETOH 03/01/2018); previously drinks 3 to 4 beers daily and 1 glass of wine; way more on the weekend    Tobacco Use History[3]  Review of Systems  Constitutional: Negative.   HENT: Negative.    Eyes: Negative.   Respiratory: Negative.    Cardiovascular: Negative.   Gastrointestinal:  Positive for abdominal pain.  Genitourinary: Negative.   Musculoskeletal:  Positive for back pain and joint pain.  Skin: Negative.   Neurological: Negative.   Endo/Heme/Allergies: Negative.   Psychiatric/Behavioral: Negative.      Objective   Vitals:   05/24/24 0940  BP: 129/74  Pulse: 72  Resp: 14  Temp: 97.8 F (36.6 C)  SpO2: 96%    Physical Exam Vitals reviewed.  Constitutional:      Appearance: Normal appearance. He is normal weight. He is not ill-appearing.  HENT:     Head: Normocephalic and atraumatic.  Cardiovascular:     Rate and Rhythm: Normal rate and regular rhythm.     Heart sounds: Normal heart sounds. No murmur heard.    No friction rub. No gallop.  Pulmonary:     Effort: Pulmonary effort is normal. No respiratory distress.     Breath sounds: Normal  breath sounds. No stridor. No wheezing, rhonchi or rales.  Abdominal:     General: Abdomen is flat. Bowel sounds are normal. There is no distension.     Palpations: Abdomen is soft. There is no mass.     Tenderness: There is no abdominal tenderness. There is no guarding or rebound.     Hernia: A hernia is present.     Comments: Easily reducible left inguinal hernia.  No right inguinal hernia present.  Genitourinary:    Testes: Normal.  Skin:    General: Skin is warm and dry.  Neurological:     Mental Status: He is alert and oriented to person,  place, and time.     Assessment  Left inguinal hernia Plan  Patient is scheduled for robotic assisted laparoscopic left inguinal herniorrhaphy with mesh on 06/08/2024.  The risks and benefits of the procedure including bleeding, infection, mesh use, recurrence of the hernia, and the possibility of an open procedure were fully explained to the patient, who gave informed consent.    [1]  Current Outpatient Medications on File Prior to Visit  Medication Sig Dispense Refill   ALPRAZolam  (XANAX ) 0.5 MG tablet Take 0.5 mg by mouth 2 (two) times daily as needed for anxiety.     amphetamine -dextroamphetamine  (ADDERALL) 20 MG tablet Take 20 mg by mouth 3 (three) times daily.     Buprenorphine HCl-Naloxone HCl 8-2 MG FILM Place under the tongue.     buPROPion  (WELLBUTRIN  SR) 150 MG 12 hr tablet Take 1 tablet (150 mg total) by mouth daily for 3 days, THEN 1 tablet (150 mg total) 2 (two) times daily. 180 tablet 3   folic acid  (FOLVITE ) 1 MG tablet Take 1 mg by mouth daily.     Golimumab (SIMPONI ARIA IV) Inject 1 Dose into the vein every 8 (eight) weeks.     Insulin Syringe-Needle U-100 (B-D INS SYR MICROFINE 1CC/27G) 27G X 5/8 1 ML MISC once a week for MTX; Duration: 84 days     methotrexate 50 MG/2ML injection Inject 50 mg/m2 into the muscle once a week.     omeprazole (PRILOSEC) 20 MG capsule Take 20 mg by mouth daily as needed (acid reflux).     rosuvastatin  (CRESTOR ) 5 MG tablet Take 5 mg by mouth daily.     sulfaSALAzine (AZULFIDINE) 500 MG tablet Take 500 mg by mouth 2 (two) times daily.     tamsulosin (FLOMAX) 0.4 MG CAPS capsule Take 0.4 mg by mouth daily.     No current facility-administered medications on file prior to visit.  [2] No Known Allergies [3]  Social History Tobacco Use  Smoking Status Every Day   Current packs/day: 1.00   Average packs/day: 1 pack/day for 32.0 years (32.0 ttl pk-yrs)   Types: Cigarettes   Passive exposure: Current  Smokeless Tobacco Never

## 2024-05-25 NOTE — H&P (Signed)
 Howard [name]; [id number]; [birth year]   HPI Patient is a 59 year old white male who is referred to my care by Norman Larve for evaluation treatment of a left inguinal hernia.  Patient is status post a robotic assisted laparoscopic right inguinal herniorrhaphy in 2023.  He states he recently developed a left inguinal hernia and it does cause him discomfort.  It is made worse with straining. Past Medical History:  Diagnosis Date   Alcohol abuse    Anxiety    Chronic back pain    Chronic pain    Depression    Fall    Fracture of right elbow    Hereditary hemochromatosis 06/01/2022   Osteoarthritis    Psoriasis     Past Surgical History:  Procedure Laterality Date   APPENDECTOMY     COLONOSCOPY WITH PROPOFOL  N/A 06/07/2019   diverticulosis, otherwise normal. Non-bleeding internal hemorrhoids.    FOOT SURGERY     per patient: neurofibroma excision   LUMBAR LAMINECTOMY/DECOMPRESSION MICRODISCECTOMY Bilateral 12/31/2015   Procedure: LUMBER DECOMPRESSION L4-5/ L5-S1 BILATERALLY  ;  Surgeon: Reyes Billing, MD;  Location: [facility];  Service: Orthopedics;  Laterality: Bilateral;   REVERSE SHOULDER ARTHROPLASTY Right 10/24/2018   REVERSE SHOULDER ARTHROPLASTY Right 10/24/2018   Procedure: REVERSE SHOULDER ARTHROPLASTY;  Surgeon: Sharl Selinda Dover, MD;  Location: [facility];  Service: Orthopedics;  Laterality: Right;   SHOULDER SURGERY Right    TOTAL HIP ARTHROPLASTY Right    TOTAL HIP ARTHROPLASTY Left 08/27/2014   Procedure: LEFT TOTAL HIP ARTHROPLASTY ANTERIOR APPROACH;  Surgeon: Donnice Car, MD;  Location: [facility];  Service: Orthopedics;  Laterality: Left;   XI ROBOTIC ASSISTED INGUINAL HERNIA REPAIR WITH MESH Right 05/12/2022   Procedure: XI ROBOTIC ASSISTED INGUINAL HERNIA REPAIR WITH MESH, RIGHT;  Surgeon: Mavis Anes, MD;  Location: [facility];  Service: General;  Laterality: Right;    Family History  Problem Relation Age of Onset   Heart attack Mother    Colon cancer Neg Hx      [Medications Ordered Prior to [employer] Ordered Prior to [employer] Current Outpatient Medications on File Prior to Visit  Medication Sig Dispense Refill   ALPRAZolam  (XANAX ) 0.5 MG tablet Take 0.5 mg by mouth 2 (two) times daily as needed for anxiety.     amphetamine -dextroamphetamine  (ADDERALL) 20 MG tablet Take 20 mg by mouth 3 (three) times daily.     Buprenorphine HCl-Naloxone HCl 8-2 MG FILM Place under the tongue.     buPROPion  (WELLBUTRIN  SR) 150 MG 12 hr tablet Take 1 tablet (150 mg total) by mouth daily for 3 days, THEN 1 tablet (150 mg total) 2 (two) times daily. 180 tablet 3   folic acid  (FOLVITE ) 1 MG tablet Take 1 mg by mouth daily.     Golimumab (SIMPONI ARIA IV) Inject 1 Dose into the vein every 8 (eight) weeks.     Insulin Syringe-Needle U-100 (B-D INS SYR MICROFINE 1CC/27G) 27G X 5/8 1 ML MISC once a week for MTX; Duration: 84 days     methotrexate 50 MG/2ML injection Inject 50 mg/m2 into the muscle once a week.     omeprazole (PRILOSEC) 20 MG capsule Take 20 mg by mouth daily as needed (acid reflux).     rosuvastatin  (CRESTOR ) 5 MG tablet Take 5 mg by mouth daily.     sulfaSALAzine (AZULFIDINE) 500 MG tablet Take 500 mg by mouth 2 (two) times daily.     tamsulosin (FLOMAX) 0.4 MG CAPS capsule Take 0.4 mg by mouth daily.  No current facility-administered medications on file prior to visit.    [Allergies]  [Allergies] No Known Allergies   Social History   Substance and Sexual Activity  Alcohol Use Not Currently   Alcohol/week: 16.0 standard drinks of alcohol   Types: 6 Glasses of wine, 10 Cans of beer per week   Comment: None currently (last ETOH 03/01/2018); previously drinks 3 to 4 beers daily and 1 glass of wine; way more on the weekend    [Tobacco Use History]  [Tobacco Use History] Tobacco Use  Smoking Status Every Day   Current packs/day: 1.00   Average packs/day: 1 pack/day for 32.0 years (32.0 ttl pk-yrs)   Types:  Cigarettes   Passive exposure: Current  Smokeless Tobacco Never    Review of Systems  Constitutional: Negative.   HENT: Negative.    Eyes: Negative.   Respiratory: Negative.    Cardiovascular: Negative.   Gastrointestinal:  Positive for abdominal pain.  Genitourinary: Negative.   Musculoskeletal:  Positive for back pain and joint pain.  Skin: Negative.   Neurological: Negative.   Endo/Heme/Allergies: Negative.   Psychiatric/Behavioral: Negative.      Objective   Vitals:   05/24/24 0940  BP: 129/74  Pulse: 72  Resp: 14  Temp: 97.8 F (36.6 C)  SpO2: 96%    Physical Exam Vitals reviewed.  Constitutional:      Appearance: Normal appearance. He is normal weight. He is not ill-appearing.  HENT:     Head: Normocephalic and atraumatic.  Cardiovascular:     Rate and Rhythm: Normal rate and regular rhythm.     Heart sounds: Normal heart sounds. No murmur heard.    No friction rub. No gallop.  Pulmonary:     Effort: Pulmonary effort is normal. No respiratory distress.     Breath sounds: Normal breath sounds. No stridor. No wheezing, rhonchi or rales.  Abdominal:     General: Abdomen is flat. Bowel sounds are normal. There is no distension.     Palpations: Abdomen is soft. There is no mass.     Tenderness: There is no abdominal tenderness. There is no guarding or rebound.     Hernia: A hernia is present.     Comments: Easily reducible left inguinal hernia.  No right inguinal hernia present.  Genitourinary:    Testes: Normal.  Skin:    General: Skin is warm and dry.  Neurological:     Mental Status: He is alert and oriented to person, place, and time.     Assessment  Left inguinal hernia Plan  Patient is scheduled for robotic assisted laparoscopic left inguinal herniorrhaphy with mesh on 06/08/2024.  The risks and benefits of the procedure including bleeding, infection, mesh use, recurrence of the hernia, and the possibility of an open procedure were fully explained  to the patient, who gave informed consent.

## 2024-05-28 ENCOUNTER — Ambulatory Visit (HOSPITAL_COMMUNITY)

## 2024-06-01 ENCOUNTER — Inpatient Hospital Stay

## 2024-06-01 LAB — CBC
HCT: 38.3 % — ABNORMAL LOW (ref 39.0–52.0)
Hemoglobin: 12.8 g/dL — ABNORMAL LOW (ref 13.0–17.0)
MCH: 34.2 pg — ABNORMAL HIGH (ref 26.0–34.0)
MCHC: 33.4 g/dL (ref 30.0–36.0)
MCV: 102.4 fL — ABNORMAL HIGH (ref 80.0–100.0)
Platelets: 174 K/uL (ref 150–400)
RBC: 3.74 MIL/uL — ABNORMAL LOW (ref 4.22–5.81)
RDW: 13.4 % (ref 11.5–15.5)
WBC: 5.4 K/uL (ref 4.0–10.5)
nRBC: 0 % (ref 0.0–0.2)

## 2024-06-01 NOTE — Patient Instructions (Signed)

## 2024-06-01 NOTE — Progress Notes (Signed)
 Howard [name] presents today for phlebotomy per MD orders. Labs reviewed and pt agrees to have phlebotomy. Phlebotomy procedure started at  1300 and ended at 1310. 500 cc removed. Patient tolerated procedure well. IV needle removed intact.VSS. Pt stable at discharge.   Dewell Monnier

## 2024-06-05 ENCOUNTER — Encounter (HOSPITAL_COMMUNITY)
Admission: RE | Admit: 2024-06-05 | Discharge: 2024-06-05 | Disposition: A | Discharge status: Home / Self Care | Source: Ambulatory Visit | Attending: General Surgery | Admitting: General Surgery

## 2024-06-05 ENCOUNTER — Encounter (HOSPITAL_COMMUNITY): Payer: Self-pay

## 2024-06-05 NOTE — Patient Instructions (Signed)
 "   Howard [name]  06/05/2024     @PREFPERIOPPHARMACY @   Your procedure is scheduled on 06/08/2024.   Report to [facility] at 6:00 A.M.   Call this number if you have problems the morning of surgery:  [phone]  If you experience any cold or flu symptoms such as cough, fever, chills, shortness of breath, etc. between now and your scheduled surgery, please notify us  at the above number.   Remember:   Do not eat or drink after midnight.    Take these medicines the morning of surgery with A SIP OF WATER  : Alprazolam  Omeprazole Sulfalazine and Tamsulosin    Do not wear jewelry, make-up or nail polish, including gel polish,  artificial nails, or any other type of covering on natural nails (fingers and  toes).  Do not wear lotions, powders, or perfumes, or deodorant.  Do not shave 48 hours prior to surgery.  Men may shave face and neck.  Do not bring valuables to the hospital.  [facility] is not responsible for any belongings or valuables.  Contacts, dentures or bridgework may not be worn into surgery.  Leave your suitcase in the car.  After surgery it may be brought to your room.  For patients admitted to the hospital, discharge time will be determined by your treatment team.  Patients discharged the day of surgery will not be allowed to drive home.   Name and phone number of your driver:   family  Special instructions:  N/A  Please read over the following fact sheets that you were given.  Care and Recovery After Surgery  Laparoscopic Surgery for Belly Hernias: What to Expect  Laparoscopic surgery for belly hernias is a procedure to treat a bulge of tissue that pushes through a weak area of the belly (ventral hernia). This procedure may be done right away if part of your intestine gets trapped inside the hernia and starts to lose its blood supply (strangulation). Laparoscopic surgery is done through small cuts using a scope with a light and camera (laparoscope). Tell a  health care provider about: Any allergies you have. All medicines you're taking. These include vitamins, herbs, eye drops, creams, and over-the-counter medicines. Any problems you or family members have had with anesthesia. Any bleeding problems you have. Any surgeries you've had. Any medical conditions you have. Whether you're pregnant or may be pregnant. What are the risks? Your health care provider will talk with you about risks. These may include: Infection. Bleeding or blood clots. Damage to nearby structures in the belly. Trouble pooping or peeing. The hernia coming back after surgery. Allergy to the mesh, if a mesh was used. Fluid buildup in the area of the hernia. In some cases, your provider may need to switch from a laparoscopic procedure to a procedure that's done through a single, larger incision in the belly (open procedure). You may need an open procedure if: You have a hernia that's hard to repair. Your organs are hard to see with the laparoscope. You have bleeding problems during the laparoscopic procedure. What happens before the procedure? When to stop eating and drinking Eat and drink only as you've been told. You may be told this: 8 hours before your surgery Stop eating most foods. Do not eat meat, fried foods, or fatty foods. Eat only light foods, such as toast or crackers. All liquids are OK except energy drinks and alcohol. 6 hours before your surgery Stop eating. Drink only clear liquids, such as water , clear  fruit juice, black coffee, plain tea, and sports drinks. Do not drink energy drinks or alcohol. 2 hours before your surgery Stop drinking all liquids. You may be allowed to take medicines with small sips of water . If you do not eat and drink as told, your surgery may be delayed or canceled. Medicines Ask about changing or stopping: Any medicines you take. Any vitamins, herbs, or supplements you take. Do not take aspirin  or ibuprofen  unless you're  told to. Tests You may have an exam or testing, including: Blood tests. Pee tests. Ultrasound of the belly. Chest X-ray. Electrocardiogram (ECG). Surgery safety For your safety, you may: Need to wash your skin with a soap that kills germs. Get antibiotics. Have your surgery site marked. Have hair removed at the surgery site. General instructions Do not smoke, vape, or use nicotine or tobacco for at least 4 weeks before the surgery. Ask if you'll be staying overnight in the hospital. If you'll be going home right after the procedure, plan to have a responsible adult: Drive you home from the hospital or clinic. You won't be allowed to drive. Stay with you for the time you're told. What happens during the procedure?  An IV will be put into a vein in your hand or arm. You may be given: A sedative to help you relax. Anesthesia to keep you from feeling pain. Many small cuts (incisions) will be made in your belly. Gas will be pumped into your belly through one of the cuts. This will make it easier for your surgeon to see inside your belly during the repair. A laparoscope will be inserted into your belly. This will send pictures to a monitor in the operating room. The instruments needed for the surgery will be placed through the other cuts. The tissue or intestines that make up the hernia will be moved back into place. The edges of the hernia may be stitched together. A piece of mesh may be used to close the hernia. Stitches, clips, or staples will be used to keep the mesh in place. Your cuts will be closed with stitches, skin glue, or tape strips. They may be covered with a bandage. These steps may vary. Ask what you can expect. What happens after the procedure? You will be watched closely until you leave. This includes checking your pain level, blood pressure, heart rate, and breathing rate. You will continue to receive fluids and medicines through an IV. Your IV will be removed when  you can drink clear fluids. This information is not intended to replace advice given to you by your health care provider. Make sure you discuss any questions you have with your health care provider. Document Revised: 11/09/2022 Document Reviewed: 11/09/2022 Elsevier Patient Education  2024 [employer]   General Anesthesia, Adult General anesthesia is the use of medicine to make you fall asleep (unconscious) for a medical procedure. General anesthesia must be used for certain procedures. It is often recommended for surgery or procedures that: Last a long time. Require you to be still or in an unusual position. Are major and can cause blood loss. Affect your breathing. The medicines used for general anesthesia are called general anesthetics. During general anesthesia, these medicines are given along with medicines that: Prevent pain. Control your blood pressure. Relax your muscles. Prevent nausea and vomiting after the procedure. Tell a health care provider about: Any allergies you have. All medicines you are taking, including vitamins, herbs, eye drops, creams, and over-the-counter medicines. Your history of any:  Medical conditions you have, including: High blood pressure. Bleeding problems. Diabetes. Heart or lung conditions, such as: Heart failure. Sleep apnea. Asthma. Chronic obstructive pulmonary disease (COPD). Current or recent illnesses, such as: Upper respiratory, chest, or ear infections. Cough or fever. Tobacco or drug use, including marijuana or alcohol use. Depression or anxiety. Surgeries and types of anesthetics you have had. Problems you or family members have had with anesthetic medicines. Whether you are pregnant or may be pregnant. Whether you have any chipped or loose teeth, dentures, caps, bridgework, or issues with your mouth, swallowing, or choking. What are the risks? Your health care provider will talk with you about risks. These may  include: Allergic reaction to the medicines. Lung and heart problems. Inhaling food or liquid from the stomach into the lungs (aspiration). Nerve injury. Injury to the lips, mouth, teeth, or gums. Stroke. Waking up during your procedure and being unable to move. This is rare. These problems are more likely to develop if you are having a major surgery or if you have an advanced or serious medical condition. You can prevent some of these complications by answering all of your health care provider's questions thoroughly and by following all instructions before your procedure. General anesthesia can cause side effects, including: Nausea or vomiting. A sore throat or hoarseness from the breathing tube. Wheezing or coughing. Shaking chills or feeling cold. Body aches. Sleepiness. Confusion, agitation (delirium), or anxiety. What happens before the procedure? When to stop eating and drinking Follow instructions from your health care provider about what you may eat and drink before your procedure. If you do not follow your health care provider's instructions, your procedure may be delayed or canceled. Medicines Ask your health care provider about: Changing or stopping your regular medicines. These include any diabetes medicines or blood thinners you take. Taking medicines such as aspirin  and ibuprofen . These medicines can thin your blood. Do not take them unless your health care provider tells you to. Taking over-the-counter medicines, vitamins, herbs, and supplements. General instructions Do not use any products that contain nicotine or tobacco for at least 4 weeks before the procedure. These products include cigarettes, chewing tobacco, and vaping devices, such as e-cigarettes. If you need help quitting, ask your health care provider. If you brush your teeth on the morning of the procedure, make sure to spit out all of the water  and toothpaste. If told by your health care provider, bring your  sleep apnea device with you to surgery (if applicable). If you will be going home right after the procedure, plan to have a responsible adult: Take you home from the hospital or clinic. You will not be allowed to drive. Care for you for the time you are told. What happens during the procedure?  An IV will be inserted into one of your veins. You will be given one or more of the following through a face mask or IV: A sedative. This helps you relax. Anesthesia. This will: Numb certain areas of your body. Make you fall asleep for surgery. After you are unconscious, a breathing tube may be inserted down your throat to help you breathe. This will be removed before you wake up. An anesthesia provider, such as an anesthesiologist, will stay with you throughout your procedure. The anesthesia provider will: Keep you comfortable and safe by continuing to give you medicines and adjusting the amount of medicine that you get. Monitor your blood pressure, heart rate, and oxygen levels to make sure that the anesthetics do  not cause any problems. The procedure may vary among health care providers and hospitals. What happens after the procedure? Your blood pressure, temperature, heart rate, breathing rate, and blood oxygen level will be monitored until you leave the hospital or clinic. You will wake up in a recovery area. You may wake up slowly. You may be given medicine to help you with pain, nausea, or any other side effects from the anesthesia. Summary General anesthesia is the use of medicine to make you fall asleep (unconscious) for a medical procedure. Follow your health care provider's instructions about when to stop eating, drinking, or taking certain medicines before your procedure. Plan to have a responsible adult take you home from the hospital or clinic. This information is not intended to replace advice given to you by your health care provider. Make sure you discuss any questions you have with  your health care provider. Document Revised: 07/30/2021 Document Reviewed: 07/30/2021 Elsevier Patient Education  2024 [employer].  How to Use an Incentive Spirometer An incentive spirometer is a tool that measures how well you are filling your lungs with each breath. Learning to take long, deep breaths using this tool can help you keep your lungs clear and active. This may help to reverse or lessen your chance of developing breathing (pulmonary) problems, especially infection. You may be asked to use a spirometer: After a surgery. If you have a lung problem or a history of smoking. After a long period of time when you have been unable to move or be active. If the spirometer includes an indicator to show the highest number that you have reached, your health care provider or respiratory therapist will help you set a goal. Keep a log of your progress as told by your health care provider. What are the risks? Breathing too quickly may cause dizziness or cause you to pass out. Take your time so you do not get dizzy or light-headed. If you are in pain, you may need to take pain medicine before doing incentive spirometry. It is harder to take a deep breath if you are having pain. How to use your incentive spirometer  Sit up on the edge of your bed or on a chair. Hold the incentive spirometer so that it is in an upright position. Before you use the spirometer, breathe out normally. Place the mouthpiece in your mouth. Make sure your lips are closed tightly around it. Breathe in slowly and as deeply as you can through your mouth, causing the piston or the ball to rise toward the top of the chamber. Hold your breath for 3-5 seconds, or for as long as possible. If the spirometer includes a coach indicator, use this to guide you in breathing. Slow down your breathing if the indicator goes above the marked areas. Remove the mouthpiece from your mouth and breathe out normally. The piston or ball will  return to the bottom of the chamber. Rest for a few seconds, then repeat the steps 10 or more times. Take your time and take a few normal breaths between deep breaths so that you do not get dizzy or light-headed. Do this every 1-2 hours when you are awake. If the spirometer includes a goal marker to show the highest number you have reached (best effort), use this as a goal to work toward during each repetition. After each set of 10 deep breaths, cough a few times. This will help to make sure that your lungs are clear. If you have an incision  on your chest or abdomen from surgery, place a pillow or a rolled-up towel firmly against the incision when you cough. This can help to reduce pain while taking deep breaths and coughing. General tips When you are able to get out of bed: Walk around often. Continue to take deep breaths and cough in order to clear your lungs. Keep using the incentive spirometer until your health care provider says it is okay to stop using it. If you have been in the hospital, you may be told to keep using the spirometer at home. Contact a health care provider if: You are having difficulty using the spirometer. You have trouble using the spirometer as often as instructed. Your pain medicine is not giving enough relief for you to use the spirometer as told. You have a fever. Get help right away if: You develop shortness of breath. You develop a cough with bloody mucus from the lungs. You have fluid or blood coming from an incision site after you cough. Summary An incentive spirometer is a tool that can help you learn to take long, deep breaths to keep your lungs clear and active. You may be asked to use a spirometer after a surgery, if you have a lung problem or a history of smoking, or if you have been inactive for a long period of time. Use your incentive spirometer as instructed every 1-2 hours while you are awake. If you have an incision on your chest or abdomen, place  a pillow or a rolled-up towel firmly against your incision when you cough. This will help to reduce pain. Get help right away if you have shortness of breath, you cough up bloody mucus, or blood comes from your incision when you cough. This information is not intended to replace advice given to you by your health care provider. Make sure you discuss any questions you have with your health care provider. Document Revised: 03/11/2023 Document Reviewed: 03/11/2023 Elsevier Patient Education  2024 [employer].       "

## 2024-06-08 ENCOUNTER — Ambulatory Visit (HOSPITAL_COMMUNITY)

## 2024-06-08 ENCOUNTER — Inpatient Hospital Stay

## 2024-06-08 ENCOUNTER — Encounter (HOSPITAL_COMMUNITY)
Admission: RE | Disposition: A | Discharge status: Home / Self Care | Payer: Self-pay | Source: Home / Self Care | Attending: General Surgery

## 2024-06-08 ENCOUNTER — Encounter (HOSPITAL_COMMUNITY): Payer: Self-pay | Admitting: General Surgery

## 2024-06-08 ENCOUNTER — Ambulatory Visit (HOSPITAL_COMMUNITY)
Admission: RE | Admit: 2024-06-08 | Discharge: 2024-06-08 | Disposition: A | Discharge status: Home / Self Care | Attending: General Surgery | Admitting: General Surgery

## 2024-06-08 ENCOUNTER — Other Ambulatory Visit: Payer: Self-pay

## 2024-06-08 DIAGNOSIS — M1990 Unspecified osteoarthritis, unspecified site: Secondary | ICD-10-CM | POA: Insufficient documentation

## 2024-06-08 DIAGNOSIS — F419 Anxiety disorder, unspecified: Secondary | ICD-10-CM | POA: Diagnosis not present

## 2024-06-08 DIAGNOSIS — K4090 Unilateral inguinal hernia, without obstruction or gangrene, not specified as recurrent: Secondary | ICD-10-CM | POA: Insufficient documentation

## 2024-06-08 DIAGNOSIS — M549 Dorsalgia, unspecified: Secondary | ICD-10-CM | POA: Diagnosis not present

## 2024-06-08 DIAGNOSIS — F32A Depression, unspecified: Secondary | ICD-10-CM | POA: Diagnosis not present

## 2024-06-08 DIAGNOSIS — F17210 Nicotine dependence, cigarettes, uncomplicated: Secondary | ICD-10-CM | POA: Insufficient documentation

## 2024-06-08 DIAGNOSIS — Z79631 Long term (current) use of antimetabolite agent: Secondary | ICD-10-CM | POA: Diagnosis not present

## 2024-06-08 DIAGNOSIS — Z79899 Other long term (current) drug therapy: Secondary | ICD-10-CM | POA: Insufficient documentation

## 2024-06-08 MED ORDER — STERILE WATER FOR IRRIGATION IR SOLN
Status: DC | PRN
  Administered 2024-06-08: 500 mL

## 2024-06-08 MED ORDER — PROPOFOL 10 MG/ML IV BOLUS
INTRAVENOUS | Status: AC
  Filled 2024-06-08: qty 20

## 2024-06-08 MED ORDER — CHLORHEXIDINE GLUCONATE 0.12 % MT SOLN
15.0000 mL | Freq: Once | OROMUCOSAL | Status: AC
  Administered 2024-06-08: 15 mL via OROMUCOSAL

## 2024-06-08 MED ORDER — OXYCODONE HCL 5 MG PO TABS: 5.0000 mg | ORAL_TABLET | ORAL | 0 refills | Status: DC | PRN

## 2024-06-08 MED ORDER — KETOROLAC TROMETHAMINE 30 MG/ML IJ SOLN
INTRAMUSCULAR | Status: DC | PRN
  Administered 2024-06-08: 30 mg via INTRAVENOUS

## 2024-06-08 MED ORDER — CEFAZOLIN SODIUM-DEXTROSE 2-4 GM/100ML-% IV SOLN
INTRAVENOUS | Status: AC
  Filled 2024-06-08: qty 100

## 2024-06-08 MED ORDER — HYDROMORPHONE HCL 1 MG/ML IJ SOLN
INTRAMUSCULAR | Status: AC
  Filled 2024-06-08: qty 0.5

## 2024-06-08 MED ORDER — CHLORHEXIDINE GLUCONATE CLOTH 2 % EX PADS: 6.0000 | MEDICATED_PAD | Freq: Once | CUTANEOUS | Status: DC

## 2024-06-08 MED ORDER — DEXMEDETOMIDINE HCL IN NACL 80 MCG/20ML IV SOLN
INTRAVENOUS | Status: AC
  Filled 2024-06-08: qty 20

## 2024-06-08 MED ORDER — LACTATED RINGERS IV SOLN: INTRAVENOUS | Status: DC

## 2024-06-08 MED ORDER — PROPOFOL 10 MG/ML IV BOLUS
INTRAVENOUS | Status: DC | PRN
  Administered 2024-06-08: 160 mg via INTRAVENOUS

## 2024-06-08 MED ORDER — MIDAZOLAM HCL 2 MG/2ML IJ SOLN
INTRAMUSCULAR | Status: AC
  Filled 2024-06-08: qty 2

## 2024-06-08 MED ORDER — BUPIVACAINE HCL (PF) 0.5 % IJ SOLN
INTRAMUSCULAR | Status: AC
  Filled 2024-06-08: qty 30

## 2024-06-08 MED ORDER — DEXMEDETOMIDINE HCL IN NACL 80 MCG/20ML IV SOLN
INTRAVENOUS | Status: DC | PRN
  Administered 2024-06-08: 8 ug via INTRAVENOUS

## 2024-06-08 MED ORDER — HYDROMORPHONE HCL 1 MG/ML IJ SOLN
INTRAMUSCULAR | Status: DC | PRN
  Administered 2024-06-08: .5 mg via INTRAVENOUS

## 2024-06-08 MED ORDER — LACTATED RINGERS IV SOLN: INTRAVENOUS | Status: DC | PRN

## 2024-06-08 MED ORDER — CHLORHEXIDINE GLUCONATE CLOTH 2 % EX PADS
6.0000 | MEDICATED_PAD | Freq: Once | CUTANEOUS | Status: AC
  Administered 2024-06-08: 6 via TOPICAL

## 2024-06-08 MED ORDER — FENTANYL CITRATE (PF) 250 MCG/5ML IJ SOLN
INTRAMUSCULAR | Status: AC
  Filled 2024-06-08: qty 5

## 2024-06-08 MED ORDER — ORAL CARE MOUTH RINSE: 15.0000 mL | Freq: Once | OROMUCOSAL | Status: AC

## 2024-06-08 MED ORDER — LIDOCAINE 2% (20 MG/ML) 5 ML SYRINGE
INTRAMUSCULAR | Status: DC | PRN
  Administered 2024-06-08: 60 mg via INTRAVENOUS

## 2024-06-08 MED ORDER — MIDAZOLAM HCL (PF) 2 MG/2ML IJ SOLN
INTRAMUSCULAR | Status: DC | PRN
  Administered 2024-06-08: 2 mg via INTRAVENOUS

## 2024-06-08 MED ORDER — BUPIVACAINE HCL (PF) 0.5 % IJ SOLN
INTRAMUSCULAR | Status: DC | PRN
  Administered 2024-06-08: 30 mL

## 2024-06-08 MED ORDER — CEFAZOLIN SODIUM-DEXTROSE 2-4 GM/100ML-% IV SOLN
2.0000 g | INTRAVENOUS | Status: AC
  Administered 2024-06-08: 2 g via INTRAVENOUS

## 2024-06-08 MED ORDER — ROCURONIUM BROMIDE 10 MG/ML (PF) SYRINGE
PREFILLED_SYRINGE | INTRAVENOUS | Status: DC | PRN
  Administered 2024-06-08: 20 mg via INTRAVENOUS
  Administered 2024-06-08: 70 mg via INTRAVENOUS

## 2024-06-08 MED ORDER — ONDANSETRON HCL 4 MG/2ML IJ SOLN
INTRAMUSCULAR | Status: DC | PRN
  Administered 2024-06-08: 4 mg via INTRAVENOUS

## 2024-06-08 MED ORDER — HYDROMORPHONE HCL 1 MG/ML IJ SOLN: 0.2500 mg | INTRAMUSCULAR | Status: DC | PRN

## 2024-06-08 MED ORDER — FENTANYL CITRATE (PF) 100 MCG/2ML IJ SOLN
INTRAMUSCULAR | Status: DC | PRN
  Administered 2024-06-08 (×2): 50 ug via INTRAVENOUS
  Administered 2024-06-08: 100 ug via INTRAVENOUS
  Administered 2024-06-08: 50 ug via INTRAVENOUS

## 2024-06-08 MED ORDER — OXYCODONE HCL 5 MG/5ML PO SOLN: 5.0000 mg | Freq: Once | ORAL | Status: AC | PRN

## 2024-06-08 MED ORDER — OXYCODONE HCL 5 MG PO TABS
5.0000 mg | ORAL_TABLET | Freq: Once | ORAL | Status: AC | PRN
  Administered 2024-06-08: 5 mg via ORAL
  Filled 2024-06-08: qty 1

## 2024-06-08 MED ORDER — SUGAMMADEX SODIUM 200 MG/2ML IV SOLN
INTRAVENOUS | Status: DC | PRN
  Administered 2024-06-08: 200 mg via INTRAVENOUS

## 2024-06-08 MED ORDER — ROCURONIUM BROMIDE 10 MG/ML (PF) SYRINGE
PREFILLED_SYRINGE | INTRAVENOUS | Status: AC
  Filled 2024-06-08: qty 20

## 2024-06-08 MED ORDER — LIDOCAINE 2% (20 MG/ML) 5 ML SYRINGE
INTRAMUSCULAR | Status: AC
  Filled 2024-06-08: qty 10

## 2024-06-08 NOTE — Op Note (Signed)
 Patient:  Howard [name]  DOB:  [birth year]  MRN:  [id number]   Preop Diagnosis: Left inguinal hernia  Postop Diagnosis: Same  Procedure: Robotic assisted laparoscopic left inguinal herniorrhaphy with mesh  Surgeon: [name], MD  Anes: General Endotracheal  Indications: Patient is a 59 year old white male who presents with a symptomatic left inguinal hernia.  The risks and benefits of the procedure including bleeding, infection, mesh use, and the possibility of recurrence of the hernia were fully explained to the patient, reviewed informed consent.  Procedure note: The patient was placed in the supine position.  After general anesthesia was administered, the groin and abdomen were prepped and draped using the usual sterile technique with Betadine and ChloraPrep.  Surgical site confirmation was performed.  An incision was made in the left upper quadrant at Palmer's point.  A Veress needle was introduced into the abdominal cavity and confirmation of placement was done using the saline drop test.  The abdomen was then insufflated to 15 mmHg pressure.  An 8 mm trocar was introduced into the abdominal cavity under direct visualization without difficulty.  Additional 8 mm trocars were placed in the upper midline and right upper quadrant regions.  The patient was placed in Trendelenburg position.  The robot was then docked and targeted.  A peritoneal flap was formed lateral to medial over the left inguinal region.  This was taken down to Cooper's ligament.  It was then carried laterally to get into the preperitoneal space.  The patient had an indirect hernia sac.  This was freed away from the spermatic cord and approximately 7 cm posterior dissection was performed.  An extra-large Bard 3D max mesh was then inserted and secured to Cooper's ligament using a 2-0 Vicryl interrupted suture.  Another fixation suture was placed anteriorly on the abdominal wall.  The peritoneal flap was then closed using a  3-0 STRATAFIX running suture.  Air was evacuated from the preperitoneal space and good approximation of the mesh was noted.  The robot was undocked and all air was evacuated from the abdominal cavity prior to the removal of the trocars.  All wounds were irrigated with normal saline.  All wounds were injected with 0.5% Sensorcaine .  The left inguinal region was instilled with 0.5% Sensorcaine .  All incisions were closed using a 4-0 Monocryl subcuticular suture.  Dermabond was applied.  All tape and needle counts were correct at the end of the procedure.  The patient was extubated in the operating room and transferred to PACU in stable condition.  Complications: None  EBL: Minimal  Specimen: None

## 2024-06-08 NOTE — Anesthesia Preprocedure Evaluation (Addendum)
"                                    Anesthesia Evaluation  Patient identified by MRN, date of birth, ID band Patient awake    Reviewed: Allergy & Precautions, H&P , NPO status , Patient's Chart, lab work & pertinent test results  Airway Mallampati: II  TM Distance: >3 FB Neck ROM: Full    Dental no notable dental hx.    Pulmonary Current Smoker and Patient abstained from smoking. Patient states he smoked marijuana yesterday afternoon Informed patient that he is at some increased chance of laryngospasm   Pulmonary exam normal breath sounds clear to auscultation       Cardiovascular negative cardio ROS Normal cardiovascular exam Rhythm:Regular Rate:Normal     Neuro/Psych  PSYCHIATRIC DISORDERS Anxiety Depression    negative neurological ROS  negative psych ROS   GI/Hepatic negative GI ROS,,,(+)     substance abuse  alcohol use and marijuana useHx of abuse Not drinking currently   Endo/Other    Renal/GU negative Renal ROS  negative genitourinary   Musculoskeletal negative musculoskeletal ROS (+) Arthritis ,    Abdominal   Peds negative pediatric ROS (+)  Hematology negative hematology ROS (+)   Anesthesia Other Findings   Reproductive/Obstetrics negative OB ROS                              Anesthesia Physical Anesthesia Plan  ASA: 2  Anesthesia Plan: General   Post-op Pain Management:    Induction: Intravenous  PONV Risk Score and Plan:   Airway Management Planned: Oral ETT  Additional Equipment:   Intra-op Plan:   Post-operative Plan: Extubation in OR  Informed Consent: I have reviewed the patients History and Physical, chart, labs and discussed the procedure including the risks, benefits and alternatives for the proposed anesthesia with the patient or authorized representative who has indicated his/her understanding and acceptance.     Dental advisory given  Plan Discussed with: CRNA  Anesthesia  Plan Comments:          Anesthesia Quick Evaluation  "

## 2024-06-08 NOTE — Anesthesia Procedure Notes (Signed)
 Procedure Name: Intubation Date/Time: 06/08/2024 7:35 AM  Performed by: Elaine Delon CROME, CRNAPre-anesthesia Checklist: Patient identified, Emergency Drugs available, Suction available and Patient being monitored Patient Re-evaluated:Patient Re-evaluated prior to induction Oxygen Delivery Method: Circle system utilized Preoxygenation: Pre-oxygenation with 100% oxygen Induction Type: IV induction Ventilation: Mask ventilation without difficulty Laryngoscope Size: Mac and 4 Grade View: Grade I Tube type: Oral Tube size: 7.5 mm Number of attempts: 1 Airway Equipment and Method: Stylet Placement Confirmation: ETT inserted through vocal cords under direct vision, positive ETCO2 and breath sounds checked- equal and bilateral Secured at: 22 cm Tube secured with: Tape Dental Injury: Teeth and Oropharynx as per pre-operative assessment

## 2024-06-08 NOTE — Anesthesia Postprocedure Evaluation (Signed)
"   Anesthesia Post Note  Patient: Howard [name]  Procedure(s) Performed: REPAIR, HERNIA, INGUINAL, ROBOT-ASSISTED, LAPAROSCOPIC, USING MESH (Left: Inguinal)  Patient location during evaluation: PACU Anesthesia Type: General Level of consciousness: awake and alert Pain management: pain level controlled Vital Signs Assessment: post-procedure vital signs reviewed and stable Respiratory status: spontaneous breathing, nonlabored ventilation, respiratory function stable and patient connected to nasal cannula oxygen Cardiovascular status: blood pressure returned to baseline and stable Postop Assessment: no apparent nausea or vomiting Anesthetic complications: no   No notable events documented.   Last Vitals:  Vitals:   06/08/24 0930 06/08/24 0945  BP: 123/72 109/64  Pulse: (!) 57 (!) 58  Resp: 11 (!) 8  Temp:    SpO2: 100% 100%    Last Pain:  Vitals:   06/08/24 0930  TempSrc:   PainSc: Asleep                 Andrea Limes      "

## 2024-06-08 NOTE — Interval H&P Note (Signed)
 History and Physical Interval Note:  06/08/2024 7:06 AM  Howard [name] Counsell  has presented today for surgery, with the diagnosis of INGUINAL HERNIA, LEFT.  The various methods of treatment have been discussed with the patient and family. After consideration of risks, benefits and other options for treatment, the patient has consented to  Procedures: REPAIR, HERNIA, INGUINAL, ROBOT-ASSISTED, LAPAROSCOPIC, USING MESH (Left) as a surgical intervention.  The patient's history has been reviewed, patient examined, no change in status, stable for surgery.  I have reviewed the patient's chart and labs.  Questions were answered to the patient's satisfaction.     [name]

## 2024-06-08 NOTE — Transfer of Care (Signed)
 Immediate Anesthesia Transfer of Care Note  Patient: Howard [name]  Procedure(s) Performed: REPAIR, HERNIA, INGUINAL, ROBOT-ASSISTED, LAPAROSCOPIC, USING MESH (Left: Inguinal)  Patient Location: PACU  Anesthesia Type:General  Level of Consciousness: awake and alert   Airway & Oxygen Therapy: Patient Spontanous Breathing and Patient connected to nasal cannula oxygen  Post-op Assessment: Report given to RN and Post -op Vital signs reviewed and stable  Post vital signs: Reviewed and stable  Last Vitals:  Vitals Value Taken Time  BP 132/84   Temp 98   Pulse 66 06/08/24 09:11  Resp 18 06/08/24 09:11  SpO2 100 % 06/08/24 09:11  Vitals shown include unfiled device data.  Last Pain:  Vitals:   06/08/24 0645  TempSrc: Oral  PainSc: 0-No pain      Patients Stated Pain Goal: 7 (06/08/24 0645)  Complications: No notable events documented.

## 2024-06-09 ENCOUNTER — Encounter (HOSPITAL_COMMUNITY): Payer: Self-pay | Admitting: General Surgery

## 2024-06-14 ENCOUNTER — Ambulatory Visit: Admitting: General Surgery

## 2024-06-14 ENCOUNTER — Encounter: Payer: Self-pay | Admitting: General Surgery

## 2024-06-14 VITALS — BP 107/67 | HR 64 | Temp 98.0°F | Resp 14 | Ht 67.0 in | Wt 157.0 lb

## 2024-06-14 DIAGNOSIS — Z09 Encounter for follow-up examination after completed treatment for conditions other than malignant neoplasm: Secondary | ICD-10-CM

## 2024-06-14 NOTE — Progress Notes (Signed)
 Subjective:     Howard [name]  Patient here for postoperative visit, status post robotic assisted laparoscopic left inguinal herniorrhaphy with mesh.  He is doing very well.  He denies any abdominal pain.  He is gradually increasing his activity. Objective:    BP 107/67   Pulse 64   Temp 98 F (36.7 C) (Oral)   Resp 14   Ht 5' 7 (1.702 m)   Wt 157 lb (71.2 kg)   SpO2 95%   BMI 24.59 kg/m   General:  alert, cooperative, and no distress  Abdomen is soft, incisions healing well.  No hematoma or swelling in the left groin region.     Assessment:    Doing well postoperatively.    Plan:   May gradually increase his activity.  Continue to avoid any significant heavy lifting for a few weeks.  Follow-up here as needed.

## 2024-06-15 ENCOUNTER — Inpatient Hospital Stay

## 2024-06-15 LAB — CBC
HCT: 37.2 % — ABNORMAL LOW (ref 39.0–52.0)
Hemoglobin: 12.5 g/dL — ABNORMAL LOW (ref 13.0–17.0)
MCH: 34.2 pg — ABNORMAL HIGH (ref 26.0–34.0)
MCHC: 33.6 g/dL (ref 30.0–36.0)
MCV: 101.9 fL — ABNORMAL HIGH (ref 80.0–100.0)
Platelets: 217 10*3/uL (ref 150–400)
RBC: 3.65 MIL/uL — ABNORMAL LOW (ref 4.22–5.81)
RDW: 13.2 % (ref 11.5–15.5)
WBC: 6.3 10*3/uL (ref 4.0–10.5)
nRBC: 0 % (ref 0.0–0.2)

## 2024-06-15 NOTE — Progress Notes (Signed)
 Patient presents today for phlebotomy per MD orders. Phlebotomy procedure started at 1333 and ended at 1340. 500 cc removed. Patient tolerated procedure well. Procedure tolerated well and without incident. Discharged ambulatory in stable condition.

## 2024-06-15 NOTE — Patient Instructions (Signed)
 [facility] [place] [facility]  Discharge Instructions: Thank you for choosing [facility] to provide your oncology and hematology care.  If you have a lab appointment with the Cancer Center - please note that after April 8th, 2024, all labs will be drawn in the cancer center.  You do not have to check in or register with the main entrance as you have in the past but will complete your check-in in the cancer center.  Wear comfortable clothing and clothing appropriate for easy access to any Portacath or PICC line.   We strive to give you quality time with your provider. You may need to reschedule your appointment if you arrive late (15 or more minutes).  Arriving late affects you and other patients whose appointments are after yours.  Also, if you miss three or more appointments without notifying the office, you may be dismissed from the clinic at the provider's discretion.      For prescription refill requests, have your pharmacy contact our office and allow 72 hours for refills to be completed.    Today you received the following chemotherapy and/or immunotherapy agents Phlebotomy      To help prevent nausea and vomiting after your treatment, we encourage you to take your nausea medication as directed.  BELOW ARE SYMPTOMS THAT SHOULD BE REPORTED IMMEDIATELY: *FEVER GREATER THAN 100.4 F (38 C) OR HIGHER *CHILLS OR SWEATING *NAUSEA AND VOMITING THAT IS NOT CONTROLLED WITH YOUR NAUSEA MEDICATION *UNUSUAL SHORTNESS OF BREATH *UNUSUAL BRUISING OR BLEEDING *URINARY PROBLEMS (pain or burning when urinating, or frequent urination) *BOWEL PROBLEMS (unusual diarrhea, constipation, pain near the anus) TENDERNESS IN MOUTH AND THROAT WITH OR WITHOUT PRESENCE OF ULCERS (sore throat, sores in mouth, or a toothache) UNUSUAL RASH, SWELLING OR PAIN  UNUSUAL VAGINAL DISCHARGE OR ITCHING   Items with * indicate a potential emergency and should be followed  up as soon as possible or go to the Emergency Department if any problems should occur.  Please show the CHEMOTHERAPY ALERT CARD or IMMUNOTHERAPY ALERT CARD at check-in to the Emergency Department and triage nurse.  Should you have questions after your visit or need to cancel or reschedule your appointment, please contact [facility] CANCER CTR [place] - A DEPT OF Eligha Bridegroom [facility] [phone]  and follow the prompts.  Office hours are 8:00 a.m. to 4:30 p.m. Monday - Friday. Please note that voicemails left after 4:00 p.m. may not be returned until the following business day.  We are closed weekends and major holidays. You have access to a nurse at all times for urgent questions. Please call the main number to the clinic [phone] and follow the prompts.  For any non-urgent questions, you may also contact your provider using MyChart. We now offer e-Visits for anyone 11 and older to request care online for non-urgent symptoms. For details visit [email or web address].   Also download the MyChart app! Go to the app store, search "MyChart", open the app, select [place], and log in with your MyChart username and password.

## 2024-06-18 ENCOUNTER — Ambulatory Visit (HOSPITAL_COMMUNITY): Admission: RE | Admit: 2024-06-18 | Discharge: 2024-06-18 | Disposition: A | Source: Ambulatory Visit

## 2024-06-18 DIAGNOSIS — I351 Nonrheumatic aortic (valve) insufficiency: Secondary | ICD-10-CM

## 2024-06-18 DIAGNOSIS — I7121 Aneurysm of the ascending aorta, without rupture: Secondary | ICD-10-CM | POA: Diagnosis not present

## 2024-06-18 DIAGNOSIS — I2510 Atherosclerotic heart disease of native coronary artery without angina pectoris: Secondary | ICD-10-CM

## 2024-06-18 LAB — ECHOCARDIOGRAM COMPLETE
AR max vel: 4.54 cm2
AV Area VTI: 4.72 cm2
AV Area mean vel: 4.17 cm2
AV Mean grad: 5 mmHg
AV Peak grad: 7.6 mmHg
Ao pk vel: 1.38 m/s
Area-P 1/2: 3.99 cm2
S' Lateral: 3 cm

## 2024-06-21 ENCOUNTER — Ambulatory Visit: Payer: Self-pay

## 2024-06-22 ENCOUNTER — Inpatient Hospital Stay

## 2024-06-22 ENCOUNTER — Inpatient Hospital Stay: Attending: Hematology

## 2024-06-22 LAB — CBC
HCT: 33.5 % — ABNORMAL LOW (ref 39.0–52.0)
Hemoglobin: 11.2 g/dL — ABNORMAL LOW (ref 13.0–17.0)
MCH: 34.1 pg — ABNORMAL HIGH (ref 26.0–34.0)
MCHC: 33.4 g/dL (ref 30.0–36.0)
MCV: 102.1 fL — ABNORMAL HIGH (ref 80.0–100.0)
Platelets: 181 10*3/uL (ref 150–400)
RBC: 3.28 MIL/uL — ABNORMAL LOW (ref 4.22–5.81)
RDW: 13.3 % (ref 11.5–15.5)
WBC: 5.5 10*3/uL (ref 4.0–10.5)
nRBC: 0 % (ref 0.0–0.2)

## 2024-06-22 NOTE — Patient Instructions (Signed)
 [facility] - A DEPT OF [facility]  Discharge Instructions: Thank you for choosing [facility] to provide your oncology and hematology care.  If you have a lab appointment with the Cancer Center - please note that after April 8th, 2024, all labs will be drawn in the cancer center.  You do not have to check in or register with the main entrance as you have in the past but will complete your check-in in the cancer center.  Wear comfortable clothing and clothing appropriate for easy access to any Portacath or PICC line.   We strive to give you quality time with your provider. You may need to reschedule your appointment if you arrive late (15 or more minutes).  Arriving late affects you and other patients whose appointments are after yours.  Also, if you miss three or more appointments without notifying the office, you may be dismissed from the clinic at the provider's discretion.      For prescription refill requests, have your pharmacy contact our office and allow 72 hours for refills to be completed.    Today you received the following chemotherapy and/or immunotherapy agents phlebotomy. Therapeutic Phlebotomy Discharge Instructions  - Increase your fluid intake over the next 4 hours  - No smoking for 30 minutes  - Avoid using the affected arm (the one you had the blood drawn from) for heavy lifting or other activities.  - You may resume all normal activities after 30 minutes.  You are to notify the office if you experience:   - Persistent dizziness and/or lightheadedness - Uncontrolled or excessive bleeding at the site.        To help prevent nausea and vomiting after your treatment, we encourage you to take your nausea medication as directed.  BELOW ARE SYMPTOMS THAT SHOULD BE REPORTED IMMEDIATELY: *FEVER GREATER THAN 100.4 F (38 C) OR HIGHER *CHILLS OR SWEATING *NAUSEA AND VOMITING THAT IS NOT CONTROLLED WITH YOUR NAUSEA  MEDICATION *UNUSUAL SHORTNESS OF BREATH *UNUSUAL BRUISING OR BLEEDING *URINARY PROBLEMS (pain or burning when urinating, or frequent urination) *BOWEL PROBLEMS (unusual diarrhea, constipation, pain near the anus) TENDERNESS IN MOUTH AND THROAT WITH OR WITHOUT PRESENCE OF ULCERS (sore throat, sores in mouth, or a toothache) UNUSUAL RASH, SWELLING OR PAIN  UNUSUAL VAGINAL DISCHARGE OR ITCHING   Items with * indicate a potential emergency and should be followed up as soon as possible or go to the Emergency Department if any problems should occur.  Please show the CHEMOTHERAPY ALERT CARD or IMMUNOTHERAPY ALERT CARD at check-in to the Emergency Department and triage nurse.  Should you have questions after your visit or need to cancel or reschedule your appointment, please contact [facility] [place] - A DEPT OF [facility] [phone]  and follow the prompts.  Office hours are 8:00 a.m. to 4:30 p.m. Monday - Friday. Please note that voicemails left after 4:00 p.m. may not be returned until the following business day.  We are closed weekends and major holidays. You have access to a nurse at all times for urgent questions. Please call the main number to the clinic [phone] and follow the prompts.  For any non-urgent questions, you may also contact your provider using MyChart. We now offer e-Visits for anyone 31 and older to request care online for non-urgent symptoms. For details visit [email or web address].   Also download the MyChart app! Go to the app store, search "MyChart", open the app,  select [place], and log in with your MyChart username and password.

## 2024-06-22 NOTE — Progress Notes (Signed)
 Howard [name] presents for therapeutic phlebotomy per MD orders. Last HGB 11.2 / HCT 33.5 on 06-22-2024 . Vital signs stable prior to procedure. Procedure started at 13:05 pm and ended at 13:11. 500 mls of blood removed. Patient denies any dizziness , lightheadedness, or feeling faint.  Gauze and coban applied to site. Vital signs stable at completion of procedure. Patient has no complaints at this time. Alert and oriented x 3. Discharged in stable condition.

## 2024-06-29 ENCOUNTER — Inpatient Hospital Stay

## 2024-07-06 ENCOUNTER — Inpatient Hospital Stay

## 2024-07-13 ENCOUNTER — Inpatient Hospital Stay

## 2024-07-20 ENCOUNTER — Inpatient Hospital Stay

## 2024-07-20 ENCOUNTER — Inpatient Hospital Stay: Attending: Hematology

## 2024-07-27 ENCOUNTER — Inpatient Hospital Stay

## 2024-07-27 ENCOUNTER — Inpatient Hospital Stay: Admitting: Oncology

## 2024-08-17 ENCOUNTER — Ambulatory Visit
